# Patient Record
Sex: Female | Born: 1985 | Race: White | Hispanic: No | Smoking: Current every day smoker
Health system: Southern US, Community
[De-identification: ages and names within clinical notes are randomized; demographics above are authoritative.]

## PROBLEM LIST (undated history)

## (undated) DIAGNOSIS — R609 Edema, unspecified: Secondary | ICD-10-CM

## (undated) DIAGNOSIS — F32A Depression, unspecified: Secondary | ICD-10-CM

## (undated) DIAGNOSIS — F1120 Opioid dependence, uncomplicated: Secondary | ICD-10-CM

## (undated) DIAGNOSIS — F802 Mixed receptive-expressive language disorder: Secondary | ICD-10-CM

## (undated) DIAGNOSIS — F419 Anxiety disorder, unspecified: Secondary | ICD-10-CM

## (undated) DIAGNOSIS — F988 Other specified behavioral and emotional disorders with onset usually occurring in childhood and adolescence: Secondary | ICD-10-CM

## (undated) DIAGNOSIS — F1910 Other psychoactive substance abuse, uncomplicated: Secondary | ICD-10-CM

## (undated) DIAGNOSIS — F329 Major depressive disorder, single episode, unspecified: Secondary | ICD-10-CM

## (undated) DIAGNOSIS — G2581 Restless legs syndrome: Secondary | ICD-10-CM

## (undated) DIAGNOSIS — R600 Localized edema: Secondary | ICD-10-CM

## (undated) DIAGNOSIS — S069XAA Unspecified intracranial injury with loss of consciousness status unknown, initial encounter: Secondary | ICD-10-CM

## (undated) DIAGNOSIS — K219 Gastro-esophageal reflux disease without esophagitis: Secondary | ICD-10-CM

## (undated) DIAGNOSIS — Z720 Tobacco use: Secondary | ICD-10-CM

## (undated) DIAGNOSIS — Z7409 Other reduced mobility: Secondary | ICD-10-CM

## (undated) DIAGNOSIS — S069X9A Unspecified intracranial injury with loss of consciousness of unspecified duration, initial encounter: Secondary | ICD-10-CM

## (undated) DIAGNOSIS — E669 Obesity, unspecified: Secondary | ICD-10-CM

## (undated) HISTORY — PX: CHOLECYSTECTOMY: SHX55

## (undated) HISTORY — PX: LITHOTRIPSY: SUR834

## (undated) HISTORY — PX: TUBAL LIGATION: SHX77

## (undated) HISTORY — PX: ESOPHAGOGASTRODUODENOSCOPY: SHX1529

## (undated) HISTORY — PX: ESOPHAGOSCOPY W/ PERCUTANEOUS GASTROSTOMY TUBE PLACEMENT: SUR463

## (undated) HISTORY — PX: URETERAL STENT PLACEMENT: SHX822

---
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
## 2005-01-29 ENCOUNTER — Inpatient Hospital Stay (HOSPITAL_COMMUNITY): Admission: AD | Admit: 2005-01-29 | Discharge: 2005-01-31 | Payer: Self-pay | Admitting: Gynecology

## 2005-06-09 ENCOUNTER — Emergency Department: Payer: Self-pay | Admitting: Emergency Medicine

## 2005-07-15 ENCOUNTER — Emergency Department: Payer: Self-pay | Admitting: Emergency Medicine

## 2005-07-16 ENCOUNTER — Ambulatory Visit: Payer: Self-pay | Admitting: Emergency Medicine

## 2005-07-29 ENCOUNTER — Ambulatory Visit: Payer: Self-pay | Admitting: General Surgery

## 2011-04-07 DIAGNOSIS — K219 Gastro-esophageal reflux disease without esophagitis: Secondary | ICD-10-CM | POA: Insufficient documentation

## 2011-04-29 DIAGNOSIS — F1120 Opioid dependence, uncomplicated: Secondary | ICD-10-CM | POA: Insufficient documentation

## 2011-07-08 ENCOUNTER — Emergency Department: Payer: Self-pay | Admitting: Emergency Medicine

## 2011-07-15 DIAGNOSIS — S0993XA Unspecified injury of face, initial encounter: Secondary | ICD-10-CM | POA: Insufficient documentation

## 2011-07-15 DIAGNOSIS — S0292XA Unspecified fracture of facial bones, initial encounter for closed fracture: Secondary | ICD-10-CM | POA: Insufficient documentation

## 2011-08-18 DIAGNOSIS — K623 Rectal prolapse: Secondary | ICD-10-CM | POA: Insufficient documentation

## 2011-09-16 DIAGNOSIS — M950 Acquired deformity of nose: Secondary | ICD-10-CM | POA: Insufficient documentation

## 2011-11-02 ENCOUNTER — Emergency Department: Payer: Self-pay | Admitting: Emergency Medicine

## 2011-11-02 LAB — URINALYSIS, COMPLETE
Blood: NEGATIVE
Glucose,UR: NEGATIVE mg/dL (ref 0–75)
Nitrite: NEGATIVE
Ph: 7 (ref 4.5–8.0)
Specific Gravity: 1.017 (ref 1.003–1.030)

## 2011-11-02 LAB — COMPREHENSIVE METABOLIC PANEL
Alkaline Phosphatase: 82 U/L (ref 50–136)
Anion Gap: 7 (ref 7–16)
Calcium, Total: 9.7 mg/dL (ref 8.5–10.1)
Co2: 23 mmol/L (ref 21–32)
EGFR (African American): >60
EGFR (Non-African Amer.): >60
SGOT(AST): 18 U/L (ref 15–37)
SGPT (ALT): 16 U/L

## 2011-11-02 LAB — CBC
HCT: 44 % (ref 35.0–47.0)
MCH: 32 pg (ref 26.0–34.0)
MCHC: 34.8 g/dL (ref 32.0–36.0)
MCV: 92 fL (ref 80–100)
RDW: 12.5 % (ref 11.5–14.5)

## 2011-11-02 LAB — AMYLASE: Amylase: 36 U/L (ref 25–115)

## 2011-11-09 ENCOUNTER — Emergency Department: Payer: Self-pay | Admitting: Emergency Medicine

## 2012-02-02 ENCOUNTER — Emergency Department: Payer: Self-pay | Admitting: Emergency Medicine

## 2012-02-02 LAB — URINALYSIS, COMPLETE
Nitrite: NEGATIVE
Ph: 6 (ref 4.5–8.0)
Protein: NEGATIVE
Specific Gravity: 1.005 (ref 1.003–1.030)

## 2012-02-03 ENCOUNTER — Emergency Department: Payer: Self-pay | Admitting: Emergency Medicine

## 2012-04-02 ENCOUNTER — Emergency Department: Payer: Self-pay | Admitting: Emergency Medicine

## 2012-04-02 LAB — URINALYSIS, COMPLETE
Bilirubin,UR: NEGATIVE
Blood: NEGATIVE
Ketone: NEGATIVE
Ph: 9 (ref 4.5–8.0)
Protein: NEGATIVE
RBC,UR: 2 /HPF (ref 0–5)
Specific Gravity: 1.014 (ref 1.003–1.030)
Squamous Epithelial: 5

## 2012-05-16 ENCOUNTER — Emergency Department: Payer: Self-pay | Admitting: Emergency Medicine

## 2012-05-16 LAB — COMPREHENSIVE METABOLIC PANEL
Albumin: 4.7 g/dL (ref 3.4–5.0)
Alkaline Phosphatase: 78 U/L (ref 50–136)
BUN: 10 mg/dL (ref 7–18)
Bilirubin,Total: 0.5 mg/dL (ref 0.2–1.0)
Chloride: 104 mmol/L (ref 98–107)
Creatinine: 0.7 mg/dL (ref 0.60–1.30)
EGFR (African American): >60
Glucose: 99 mg/dL (ref 65–99)
Osmolality: 271 (ref 275–301)
SGPT (ALT): 17 U/L (ref 12–78)
Sodium: 136 mmol/L (ref 136–145)
Total Protein: 8.1 g/dL (ref 6.4–8.2)

## 2012-05-16 LAB — URINALYSIS, COMPLETE
Bilirubin,UR: NEGATIVE
Glucose,UR: NEGATIVE mg/dL (ref 0–75)
Leukocyte Esterase: NEGATIVE
Nitrite: NEGATIVE
Ph: 7 (ref 4.5–8.0)
Protein: NEGATIVE
Specific Gravity: 1.019 (ref 1.003–1.030)
WBC UR: 1 /HPF (ref 0–5)

## 2012-05-16 LAB — CBC WITH DIFFERENTIAL/PLATELET
Basophil #: 0 10*3/uL (ref 0.0–0.1)
Eosinophil #: 0.1 10*3/uL (ref 0.0–0.7)
HCT: 44.5 % (ref 35.0–47.0)
MCH: 32.2 pg (ref 26.0–34.0)
MCV: 94 fL (ref 80–100)
Monocyte #: 0.4 x10 3/mm (ref 0.2–0.9)
Monocyte %: 4.2 %
Neutrophil %: 74.2 %

## 2012-05-16 LAB — LIPASE, BLOOD: Lipase: 212 U/L (ref 73–393)

## 2012-05-29 ENCOUNTER — Emergency Department: Payer: Self-pay | Admitting: Emergency Medicine

## 2012-05-29 LAB — URINALYSIS, COMPLETE
Blood: NEGATIVE
Ketone: NEGATIVE
Nitrite: NEGATIVE
Ph: 9 (ref 4.5–8.0)
RBC,UR: 1 /HPF (ref 0–5)
Specific Gravity: 1.016 (ref 1.003–1.030)
Squamous Epithelial: 2
WBC UR: <1 /HPF (ref 0–5)

## 2012-07-09 ENCOUNTER — Emergency Department: Payer: Self-pay | Admitting: Emergency Medicine

## 2012-07-09 LAB — COMPREHENSIVE METABOLIC PANEL
Albumin: 4 g/dL (ref 3.4–5.0)
Alkaline Phosphatase: 77 U/L (ref 50–136)
Anion Gap: 3 — ABNORMAL LOW (ref 7–16)
BUN: 12 mg/dL (ref 7–18)
Bilirubin,Total: 0.2 mg/dL (ref 0.2–1.0)
Calcium, Total: 9 mg/dL (ref 8.5–10.1)
Chloride: 105 mmol/L (ref 98–107)
Co2: 29 mmol/L (ref 21–32)
Creatinine: 0.75 mg/dL (ref 0.60–1.30)
EGFR (African American): >60
EGFR (Non-African Amer.): >60

## 2012-07-09 LAB — URINALYSIS, COMPLETE
Blood: NEGATIVE
Glucose,UR: NEGATIVE mg/dL (ref 0–75)
Nitrite: NEGATIVE
Protein: NEGATIVE
RBC,UR: 1 /HPF (ref 0–5)
Squamous Epithelial: 8
WBC UR: 1 /HPF (ref 0–5)

## 2012-07-09 LAB — CBC
MCHC: 35.3 g/dL (ref 32.0–36.0)
MCV: 92 fL (ref 80–100)
RBC: 4.6 10*6/uL (ref 3.80–5.20)
WBC: 7.4 10*3/uL (ref 3.6–11.0)

## 2012-07-15 ENCOUNTER — Emergency Department: Payer: Self-pay | Admitting: Emergency Medicine

## 2012-07-15 LAB — URINALYSIS, COMPLETE
Bilirubin,UR: NEGATIVE
Blood: NEGATIVE
Ketone: NEGATIVE
Leukocyte Esterase: NEGATIVE
Nitrite: NEGATIVE
Ph: 7 (ref 4.5–8.0)
Protein: NEGATIVE
RBC,UR: 1 /HPF (ref 0–5)
Specific Gravity: 1.012 (ref 1.003–1.030)
Squamous Epithelial: 16

## 2012-07-15 LAB — COMPREHENSIVE METABOLIC PANEL
Alkaline Phosphatase: 74 U/L (ref 50–136)
Anion Gap: 6 — ABNORMAL LOW (ref 7–16)
BUN: 9 mg/dL (ref 7–18)
Chloride: 106 mmol/L (ref 98–107)
Creatinine: 0.71 mg/dL (ref 0.60–1.30)
Glucose: 106 mg/dL — ABNORMAL HIGH (ref 65–99)
Osmolality: 279 (ref 275–301)
Potassium: 4 mmol/L (ref 3.5–5.1)
SGOT(AST): 17 U/L (ref 15–37)
SGPT (ALT): 14 U/L (ref 12–78)
Sodium: 140 mmol/L (ref 136–145)

## 2012-07-15 LAB — CBC
MCH: 32.2 pg (ref 26.0–34.0)
Platelet: 227 10*3/uL (ref 150–440)
RBC: 4.45 10*6/uL (ref 3.80–5.20)
WBC: 11.7 10*3/uL — ABNORMAL HIGH (ref 3.6–11.0)

## 2012-07-15 LAB — WET PREP, GENITAL

## 2012-07-18 ENCOUNTER — Emergency Department: Payer: Self-pay | Admitting: Emergency Medicine

## 2012-07-18 LAB — URINALYSIS, COMPLETE
Bilirubin,UR: NEGATIVE
Blood: NEGATIVE
Glucose,UR: NEGATIVE mg/dL (ref 0–75)
Ketone: NEGATIVE
Nitrite: NEGATIVE
Ph: 5 (ref 4.5–8.0)
Protein: 30
Specific Gravity: 1.026 (ref 1.003–1.030)
WBC UR: 4 /HPF (ref 0–5)

## 2012-07-18 LAB — COMPREHENSIVE METABOLIC PANEL
Bilirubin,Total: 0.3 mg/dL (ref 0.2–1.0)
Calcium, Total: 9.2 mg/dL (ref 8.5–10.1)
Chloride: 108 mmol/L — ABNORMAL HIGH (ref 98–107)
Co2: 29 mmol/L (ref 21–32)
EGFR (African American): >60
EGFR (Non-African Amer.): >60
Glucose: 101 mg/dL — ABNORMAL HIGH (ref 65–99)
Osmolality: 279 (ref 275–301)
SGOT(AST): 22 U/L (ref 15–37)
SGPT (ALT): 14 U/L (ref 12–78)
Total Protein: 7 g/dL (ref 6.4–8.2)

## 2012-07-18 LAB — CBC
HGB: 14.7 g/dL (ref 12.0–16.0)
MCHC: 34.9 g/dL (ref 32.0–36.0)
MCV: 94 fL (ref 80–100)
Platelet: 217 10*3/uL (ref 150–440)

## 2012-07-21 ENCOUNTER — Emergency Department: Payer: Self-pay | Admitting: Internal Medicine

## 2012-07-21 LAB — URINALYSIS, COMPLETE
Bacteria: NONE SEEN
Bilirubin,UR: NEGATIVE
Bilirubin,UR: NEGATIVE
Glucose,UR: NEGATIVE mg/dL (ref 0–75)
Ketone: NEGATIVE
Leukocyte Esterase: NEGATIVE
Nitrite: NEGATIVE
Ph: 7 (ref 4.5–8.0)
Protein: NEGATIVE
Squamous Epithelial: 11
Squamous Epithelial: NONE SEEN
WBC UR: 4 /HPF (ref 0–5)
WBC UR: 1 /HPF (ref 0–5)

## 2012-08-03 ENCOUNTER — Emergency Department: Payer: Self-pay | Admitting: Emergency Medicine

## 2012-08-03 LAB — URINALYSIS, COMPLETE
Glucose,UR: NEGATIVE mg/dL (ref 0–75)
Ketone: NEGATIVE
Leukocyte Esterase: NEGATIVE
Ph: 8 (ref 4.5–8.0)
RBC,UR: 15 /HPF (ref 0–5)
Squamous Epithelial: 2
WBC UR: 1 /HPF (ref 0–5)

## 2012-08-09 ENCOUNTER — Emergency Department: Payer: Self-pay | Admitting: Emergency Medicine

## 2012-08-09 LAB — CBC
HCT: 38.8 % (ref 35.0–47.0)
HGB: 13.4 g/dL (ref 12.0–16.0)
MCH: 32.5 pg (ref 26.0–34.0)
Platelet: 214 10*3/uL (ref 150–440)
RBC: 4.12 10*6/uL (ref 3.80–5.20)
RDW: 12.6 % (ref 11.5–14.5)
WBC: 11.1 10*3/uL — ABNORMAL HIGH (ref 3.6–11.0)

## 2012-08-09 LAB — BASIC METABOLIC PANEL
BUN: 8 mg/dL (ref 7–18)
Calcium, Total: 8.9 mg/dL (ref 8.5–10.1)
Co2: 30 mmol/L (ref 21–32)
EGFR (African American): >60
EGFR (Non-African Amer.): >60
Potassium: 3.9 mmol/L (ref 3.5–5.1)

## 2012-08-16 ENCOUNTER — Ambulatory Visit: Payer: Self-pay | Admitting: Emergency Medicine

## 2012-09-10 ENCOUNTER — Emergency Department: Payer: Self-pay | Admitting: Emergency Medicine

## 2012-10-11 ENCOUNTER — Emergency Department: Payer: Self-pay | Admitting: Emergency Medicine

## 2012-10-13 DIAGNOSIS — S02610A Fracture of condylar process of mandible, unspecified side, initial encounter for closed fracture: Secondary | ICD-10-CM | POA: Insufficient documentation

## 2012-11-02 ENCOUNTER — Emergency Department: Payer: Self-pay | Admitting: Emergency Medicine

## 2012-11-02 LAB — COMPREHENSIVE METABOLIC PANEL
Albumin: 3.9 g/dL (ref 3.4–5.0)
Alkaline Phosphatase: 89 U/L (ref 50–136)
Anion Gap: 4 — ABNORMAL LOW (ref 7–16)
Chloride: 107 mmol/L (ref 98–107)
Co2: 30 mmol/L (ref 21–32)
Creatinine: 0.78 mg/dL (ref 0.60–1.30)
EGFR (Non-African Amer.): >60
Glucose: 91 mg/dL (ref 65–99)
Osmolality: 281 (ref 275–301)
Potassium: 4.2 mmol/L (ref 3.5–5.1)
SGOT(AST): 14 U/L — ABNORMAL LOW (ref 15–37)
Sodium: 141 mmol/L (ref 136–145)

## 2012-11-02 LAB — URINALYSIS, COMPLETE
Bilirubin,UR: NEGATIVE
Ketone: NEGATIVE
Nitrite: NEGATIVE
Ph: 6 (ref 4.5–8.0)
Protein: NEGATIVE
WBC UR: 1 /HPF (ref 0–5)

## 2012-11-02 LAB — CBC
HGB: 14.3 g/dL (ref 12.0–16.0)
MCH: 32.3 pg (ref 26.0–34.0)
MCV: 93 fL (ref 80–100)
RBC: 4.43 10*6/uL (ref 3.80–5.20)

## 2012-11-02 LAB — LIPASE, BLOOD: Lipase: 436 U/L — ABNORMAL HIGH (ref 73–393)

## 2012-11-03 LAB — GC/CHLAMYDIA PROBE AMP

## 2012-11-19 ENCOUNTER — Emergency Department: Payer: Self-pay | Admitting: Emergency Medicine

## 2012-11-19 LAB — URINALYSIS, COMPLETE
Glucose,UR: NEGATIVE mg/dL (ref 0–75)
Ketone: NEGATIVE
Leukocyte Esterase: NEGATIVE
Nitrite: NEGATIVE
Protein: NEGATIVE
Specific Gravity: 1.013 (ref 1.003–1.030)

## 2012-12-15 ENCOUNTER — Emergency Department: Payer: Self-pay | Admitting: Internal Medicine

## 2012-12-15 LAB — URINALYSIS, COMPLETE
Bilirubin,UR: NEGATIVE
Glucose,UR: NEGATIVE mg/dL
Ketone: NEGATIVE
Leukocyte Esterase: NEGATIVE
Nitrite: NEGATIVE
Ph: 8
Protein: NEGATIVE
RBC,UR: 2 /HPF
Specific Gravity: 1.017
Squamous Epithelial: 5
WBC UR: 1 /HPF

## 2012-12-15 LAB — CBC
HCT: 42 %
HGB: 14.7 g/dL
MCH: 32.6 pg
MCHC: 34.9 g/dL
MCV: 93 fL
Platelet: 197 10*3/uL
RBC: 4.5 X10 6/mm 3
RDW: 12.9 %
WBC: 7.6 10*3/uL

## 2012-12-15 LAB — BASIC METABOLIC PANEL
Calcium, Total: 9.2 mg/dL (ref 8.5–10.1)
EGFR (African American): >60
Glucose: 97 mg/dL (ref 65–99)

## 2012-12-28 ENCOUNTER — Emergency Department: Payer: Self-pay | Admitting: Emergency Medicine

## 2012-12-28 LAB — BASIC METABOLIC PANEL
BUN: 10 mg/dL (ref 7–18)
Calcium, Total: 9.5 mg/dL (ref 8.5–10.1)
EGFR (African American): >60
EGFR (Non-African Amer.): >60
Osmolality: 270 (ref 275–301)
Potassium: 4.1 mmol/L (ref 3.5–5.1)

## 2012-12-28 LAB — CBC
HGB: 14.4 g/dL (ref 12.0–16.0)
MCH: 32.7 pg (ref 26.0–34.0)
MCHC: 35.4 g/dL (ref 32.0–36.0)
MCV: 93 fL (ref 80–100)
RBC: 4.39 10*6/uL (ref 3.80–5.20)
WBC: 7.3 10*3/uL (ref 3.6–11.0)

## 2012-12-28 LAB — URINALYSIS, COMPLETE
Bacteria: NONE SEEN
Glucose,UR: NEGATIVE mg/dL (ref 0–75)
Ph: 7 (ref 4.5–8.0)
Protein: 30

## 2012-12-28 LAB — DRUG SCREEN, URINE
Barbiturates, Ur Screen: POSITIVE (ref ?–200)
Benzodiazepine, Ur Scrn: POSITIVE (ref ?–200)
MDMA (Ecstasy)Ur Screen: NEGATIVE (ref ?–500)
Methadone, Ur Screen: POSITIVE (ref ?–300)

## 2013-02-01 ENCOUNTER — Emergency Department: Payer: Self-pay | Admitting: Emergency Medicine

## 2013-02-02 ENCOUNTER — Emergency Department: Payer: Self-pay | Admitting: Emergency Medicine

## 2013-02-08 ENCOUNTER — Ambulatory Visit: Payer: Self-pay

## 2013-02-21 ENCOUNTER — Emergency Department: Payer: Self-pay | Admitting: Emergency Medicine

## 2013-02-21 LAB — BASIC METABOLIC PANEL
Anion Gap: 3 — ABNORMAL LOW (ref 7–16)
BUN: 11 mg/dL (ref 7–18)
Calcium, Total: 9.5 mg/dL (ref 8.5–10.1)
Chloride: 103 mmol/L (ref 98–107)
EGFR (Non-African Amer.): >60
Glucose: 102 mg/dL — ABNORMAL HIGH (ref 65–99)
Osmolality: 272 (ref 275–301)

## 2013-02-21 LAB — URINALYSIS, COMPLETE
Bacteria: NONE SEEN
Ketone: NEGATIVE
Nitrite: NEGATIVE
Ph: 6 (ref 4.5–8.0)
Squamous Epithelial: 14

## 2013-02-21 LAB — CBC
HGB: 13.2 g/dL (ref 12.0–16.0)
RDW: 12.4 % (ref 11.5–14.5)

## 2013-02-22 LAB — DRUG SCREEN, URINE
Amphetamines, Ur Screen: NEGATIVE (ref ?–1000)
Barbiturates, Ur Screen: NEGATIVE (ref ?–200)
Cannabinoid 50 Ng, Ur ~~LOC~~: POSITIVE (ref ?–50)
MDMA (Ecstasy)Ur Screen: NEGATIVE (ref ?–500)
Phencyclidine (PCP) Ur S: NEGATIVE (ref ?–25)
Tricyclic, Ur Screen: NEGATIVE (ref ?–1000)

## 2013-03-24 ENCOUNTER — Emergency Department: Payer: Self-pay | Admitting: Emergency Medicine

## 2013-03-24 LAB — CBC WITH DIFFERENTIAL/PLATELET
Basophil #: 0.1 10*3/uL (ref 0.0–0.1)
Eosinophil %: 1.2 %
HGB: 13 g/dL (ref 12.0–16.0)
Lymphocyte %: 18.7 %
MCHC: 34.2 g/dL (ref 32.0–36.0)
MCV: 93 fL (ref 80–100)
Monocyte #: 0.6 x10 3/mm (ref 0.2–0.9)
Neutrophil #: 8.4 10*3/uL — ABNORMAL HIGH (ref 1.4–6.5)
Neutrophil %: 74.1 %
Platelet: 213 10*3/uL (ref 150–440)
WBC: 11.4 10*3/uL — ABNORMAL HIGH (ref 3.6–11.0)

## 2013-03-24 LAB — DRUG SCREEN, URINE
Amphetamines, Ur Screen: NEGATIVE (ref ?–1000)
Benzodiazepine, Ur Scrn: NEGATIVE (ref ?–200)
MDMA (Ecstasy)Ur Screen: NEGATIVE (ref ?–500)
Methadone, Ur Screen: NEGATIVE (ref ?–300)

## 2013-03-24 LAB — URINALYSIS, COMPLETE: Protein: 30

## 2013-03-25 LAB — COMPREHENSIVE METABOLIC PANEL
Albumin: 3.5 g/dL (ref 3.4–5.0)
Alkaline Phosphatase: 64 U/L
Anion Gap: 5 — ABNORMAL LOW (ref 7–16)
Creatinine: 0.85 mg/dL (ref 0.60–1.30)
EGFR (African American): >60
Potassium: 3.9 mmol/L (ref 3.5–5.1)
SGPT (ALT): 13 U/L (ref 12–78)

## 2013-04-11 ENCOUNTER — Emergency Department: Payer: Self-pay | Admitting: Emergency Medicine

## 2013-04-11 LAB — URINALYSIS, COMPLETE
Bacteria: NONE SEEN
Bilirubin,UR: NEGATIVE
Blood: NEGATIVE
Glucose,UR: NEGATIVE mg/dL (ref 0–75)
Leukocyte Esterase: NEGATIVE
Nitrite: NEGATIVE
Specific Gravity: 1.01 (ref 1.003–1.030)
WBC UR: 1 /HPF (ref 0–5)

## 2013-04-11 LAB — COMPREHENSIVE METABOLIC PANEL
Albumin: 4.1 g/dL (ref 3.4–5.0)
Alkaline Phosphatase: 74 U/L
Anion Gap: 5 — ABNORMAL LOW (ref 7–16)
BUN: 10 mg/dL (ref 7–18)
Calcium, Total: 9.9 mg/dL (ref 8.5–10.1)
Creatinine: 0.77 mg/dL (ref 0.60–1.30)
EGFR (African American): >60
EGFR (Non-African Amer.): >60
Osmolality: 276 (ref 275–301)
Potassium: 3.6 mmol/L (ref 3.5–5.1)

## 2013-04-11 LAB — CBC WITH DIFFERENTIAL/PLATELET
Basophil %: 0.7 %
MCV: 93 fL (ref 80–100)
Monocyte %: 3.5 %
Platelet: 266 10*3/uL (ref 150–440)
RBC: 4.56 10*6/uL (ref 3.80–5.20)
WBC: 8.8 10*3/uL (ref 3.6–11.0)

## 2013-04-12 ENCOUNTER — Inpatient Hospital Stay: Payer: Self-pay | Admitting: Internal Medicine

## 2013-04-12 LAB — CBC WITH DIFFERENTIAL/PLATELET
Basophil %: 0.4 %
MCH: 32.8 pg (ref 26.0–34.0)
MCV: 93 fL (ref 80–100)
Neutrophil #: 6.6 10*3/uL — ABNORMAL HIGH (ref 1.4–6.5)
Neutrophil %: 70.9 %
RDW: 13.2 % (ref 11.5–14.5)

## 2013-04-12 LAB — COMPREHENSIVE METABOLIC PANEL
Calcium, Total: 9.8 mg/dL (ref 8.5–10.1)
Creatinine: 1.02 mg/dL (ref 0.60–1.30)
Potassium: 4.1 mmol/L (ref 3.5–5.1)
Sodium: 141 mmol/L (ref 136–145)
Total Protein: 7.7 g/dL (ref 6.4–8.2)

## 2013-04-12 LAB — LIPASE, BLOOD: Lipase: 563 U/L — ABNORMAL HIGH (ref 73–393)

## 2013-04-13 LAB — COMPREHENSIVE METABOLIC PANEL
Albumin: 3.2 g/dL — ABNORMAL LOW (ref 3.4–5.0)
Bilirubin,Total: 0.4 mg/dL (ref 0.2–1.0)
Co2: 27 mmol/L (ref 21–32)
Creatinine: 0.72 mg/dL (ref 0.60–1.30)
EGFR (African American): >60
EGFR (Non-African Amer.): >60
Glucose: 97 mg/dL (ref 65–99)
Osmolality: 281 (ref 275–301)
SGPT (ALT): 11 U/L — ABNORMAL LOW (ref 12–78)

## 2013-04-13 LAB — CBC WITH DIFFERENTIAL/PLATELET
Basophil %: 0.5 %
Lymphocyte #: 3 10*3/uL (ref 1.0–3.6)
MCH: 32.4 pg (ref 26.0–34.0)
MCHC: 34.4 g/dL (ref 32.0–36.0)
MCV: 94 fL (ref 80–100)
Monocyte #: 0.6 x10 3/mm (ref 0.2–0.9)
Platelet: 208 10*3/uL (ref 150–440)

## 2013-04-14 LAB — URINALYSIS, COMPLETE
Glucose,UR: NEGATIVE mg/dL (ref 0–75)
Ketone: NEGATIVE
Ph: 6 (ref 4.5–8.0)
Specific Gravity: 1.011 (ref 1.003–1.030)
WBC UR: 373 /HPF (ref 0–5)

## 2013-04-21 ENCOUNTER — Emergency Department: Payer: Self-pay | Admitting: Emergency Medicine
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
## 2013-08-31 ENCOUNTER — Emergency Department: Payer: Self-pay | Admitting: Emergency Medicine

## 2013-09-01 LAB — URINALYSIS, COMPLETE
BILIRUBIN, UR: NEGATIVE
GLUCOSE, UR: NEGATIVE mg/dL (ref 0–75)
KETONE: NEGATIVE
Nitrite: NEGATIVE
Ph: 7 (ref 4.5–8.0)
Protein: 100
RBC,UR: 23 /HPF (ref 0–5)
Specific Gravity: 1.018 (ref 1.003–1.030)
WBC UR: 57 /HPF (ref 0–5)

## 2013-09-05 ENCOUNTER — Emergency Department: Payer: Self-pay | Admitting: Emergency Medicine

## 2013-09-05 LAB — CBC WITH DIFFERENTIAL/PLATELET
BASOS PCT: 0.5 %
Basophil #: 0 10*3/uL (ref 0.0–0.1)
EOS ABS: 0.3 10*3/uL (ref 0.0–0.7)
Eosinophil %: 4.4 %
HCT: 41 % (ref 35.0–47.0)
HGB: 14 g/dL (ref 12.0–16.0)
LYMPHS ABS: 2 10*3/uL (ref 1.0–3.6)
LYMPHS PCT: 34.9 %
MCH: 32 pg (ref 26.0–34.0)
MCHC: 34.2 g/dL (ref 32.0–36.0)
MCV: 94 fL (ref 80–100)
MONO ABS: 0.4 x10 3/mm (ref 0.2–0.9)
MONOS PCT: 6.7 %
Neutrophil #: 3.1 10*3/uL (ref 1.4–6.5)
Neutrophil %: 53.5 %
PLATELETS: 190 10*3/uL (ref 150–440)
RBC: 4.38 10*6/uL (ref 3.80–5.20)
RDW: 12.5 % (ref 11.5–14.5)
WBC: 5.8 10*3/uL (ref 3.6–11.0)

## 2013-09-05 LAB — BASIC METABOLIC PANEL
Anion Gap: 2 — ABNORMAL LOW (ref 7–16)
BUN: 16 mg/dL (ref 7–18)
CHLORIDE: 105 mmol/L (ref 98–107)
CO2: 32 mmol/L (ref 21–32)
Calcium, Total: 9.5 mg/dL (ref 8.5–10.1)
Creatinine: 0.75 mg/dL (ref 0.60–1.30)
EGFR (African American): >60
EGFR (Non-African Amer.): >60
Glucose: 108 mg/dL — ABNORMAL HIGH (ref 65–99)
Osmolality: 279 (ref 275–301)
Potassium: 3.5 mmol/L (ref 3.5–5.1)
SODIUM: 139 mmol/L (ref 136–145)

## 2013-09-08 ENCOUNTER — Ambulatory Visit: Payer: Self-pay | Admitting: Physician Assistant

## 2013-09-08 LAB — URINALYSIS, COMPLETE
Bilirubin,UR: NEGATIVE
GLUCOSE, UR: NEGATIVE mg/dL (ref 0–75)
Ketone: NEGATIVE
Nitrite: POSITIVE
Ph: 6 (ref 4.5–8.0)
Specific Gravity: 1.02 (ref 1.003–1.030)

## 2013-09-08 LAB — CBC WITH DIFFERENTIAL/PLATELET
Basophil #: 0 10*3/uL (ref 0.0–0.1)
Basophil %: 0.2 %
Eosinophil #: 0.2 10*3/uL (ref 0.0–0.7)
Eosinophil %: 1.5 %
HCT: 39.7 % (ref 35.0–47.0)
HGB: 13.6 g/dL (ref 12.0–16.0)
LYMPHS ABS: 1 10*3/uL (ref 1.0–3.6)
Lymphocyte %: 9.4 %
MCH: 32.1 pg (ref 26.0–34.0)
MCHC: 34.2 g/dL (ref 32.0–36.0)
MCV: 94 fL (ref 80–100)
MONOS PCT: 7.1 %
Monocyte #: 0.8 x10 3/mm (ref 0.2–0.9)
NEUTROS ABS: 8.9 10*3/uL — AB (ref 1.4–6.5)
Neutrophil %: 81.8 %
PLATELETS: 137 10*3/uL — AB (ref 150–440)
RBC: 4.22 10*6/uL (ref 3.80–5.20)
RDW: 12.9 % (ref 11.5–14.5)
WBC: 10.9 10*3/uL (ref 3.6–11.0)

## 2013-09-08 LAB — BASIC METABOLIC PANEL
Anion Gap: 8 (ref 7–16)
BUN: 11 mg/dL (ref 7–18)
CALCIUM: 8.6 mg/dL (ref 8.5–10.1)
CHLORIDE: 102 mmol/L (ref 98–107)
CREATININE: 1.03 mg/dL (ref 0.60–1.30)
Co2: 28 mmol/L (ref 21–32)
EGFR (African American): >60
Glucose: 102 mg/dL — ABNORMAL HIGH (ref 65–99)
OSMOLALITY: 275 (ref 275–301)
POTASSIUM: 3.7 mmol/L (ref 3.5–5.1)
Sodium: 138 mmol/L (ref 136–145)

## 2013-09-08 LAB — WET PREP, GENITAL

## 2013-09-10 LAB — URINE CULTURE

## 2013-10-04 ENCOUNTER — Ambulatory Visit: Payer: Self-pay | Admitting: Family Medicine

## 2013-10-08 ENCOUNTER — Emergency Department: Payer: Self-pay | Admitting: Internal Medicine

## 2013-10-08 LAB — COMPREHENSIVE METABOLIC PANEL
ALBUMIN: 3.6 g/dL (ref 3.4–5.0)
ALT: 18 U/L (ref 12–78)
Alkaline Phosphatase: 75 U/L
Anion Gap: 7 (ref 7–16)
BUN: 18 mg/dL (ref 7–18)
Bilirubin,Total: 0.3 mg/dL (ref 0.2–1.0)
CHLORIDE: 108 mmol/L — AB (ref 98–107)
Calcium, Total: 9 mg/dL (ref 8.5–10.1)
Co2: 27 mmol/L (ref 21–32)
Creatinine: 0.98 mg/dL (ref 0.60–1.30)
EGFR (Non-African Amer.): >60
Glucose: 86 mg/dL (ref 65–99)
Osmolality: 284 (ref 275–301)
POTASSIUM: 3.7 mmol/L (ref 3.5–5.1)
SGOT(AST): 27 U/L (ref 15–37)
Sodium: 142 mmol/L (ref 136–145)
Total Protein: 6.8 g/dL (ref 6.4–8.2)

## 2013-10-08 LAB — CBC
HCT: 37.8 % (ref 35.0–47.0)
HGB: 12.7 g/dL (ref 12.0–16.0)
MCH: 31.3 pg (ref 26.0–34.0)
MCHC: 33.6 g/dL (ref 32.0–36.0)
MCV: 93 fL (ref 80–100)
Platelet: 162 10*3/uL (ref 150–440)
RBC: 4.06 10*6/uL (ref 3.80–5.20)
RDW: 12.5 % (ref 11.5–14.5)
WBC: 5.9 10*3/uL (ref 3.6–11.0)

## 2013-10-08 LAB — LIPASE, BLOOD: Lipase: 287 U/L (ref 73–393)

## 2013-11-03 ENCOUNTER — Emergency Department: Payer: Self-pay | Admitting: Emergency Medicine

## 2013-11-03 LAB — COMPREHENSIVE METABOLIC PANEL
Albumin: 3.7 g/dL (ref 3.4–5.0)
Alkaline Phosphatase: 72 U/L
Anion Gap: 7 (ref 7–16)
BILIRUBIN TOTAL: 0.6 mg/dL (ref 0.2–1.0)
BUN: 15 mg/dL (ref 7–18)
CREATININE: 1.09 mg/dL (ref 0.60–1.30)
Calcium, Total: 9.4 mg/dL (ref 8.5–10.1)
Chloride: 108 mmol/L — ABNORMAL HIGH (ref 98–107)
Co2: 26 mmol/L (ref 21–32)
EGFR (African American): >60
EGFR (Non-African Amer.): >60
Glucose: 110 mg/dL — ABNORMAL HIGH (ref 65–99)
Osmolality: 283 (ref 275–301)
Potassium: 3.9 mmol/L (ref 3.5–5.1)
SGOT(AST): 19 U/L (ref 15–37)
SGPT (ALT): 18 U/L (ref 12–78)
Sodium: 141 mmol/L (ref 136–145)
Total Protein: 7.3 g/dL (ref 6.4–8.2)

## 2013-11-03 LAB — CBC WITH DIFFERENTIAL/PLATELET
Basophil #: 0.1 10*3/uL (ref 0.0–0.1)
Basophil %: 0.8 %
EOS PCT: 3.3 %
Eosinophil #: 0.2 10*3/uL (ref 0.0–0.7)
HCT: 39.4 % (ref 35.0–47.0)
HGB: 13.2 g/dL (ref 12.0–16.0)
Lymphocyte #: 3.1 10*3/uL (ref 1.0–3.6)
Lymphocyte %: 45.1 %
MCH: 32.7 pg (ref 26.0–34.0)
MCHC: 33.6 g/dL (ref 32.0–36.0)
MCV: 98 fL (ref 80–100)
MONOS PCT: 6.9 %
Monocyte #: 0.5 x10 3/mm (ref 0.2–0.9)
Neutrophil #: 3 10*3/uL (ref 1.4–6.5)
Neutrophil %: 43.9 %
PLATELETS: 201 10*3/uL (ref 150–440)
RBC: 4.04 10*6/uL (ref 3.80–5.20)
RDW: 12.5 % (ref 11.5–14.5)
WBC: 6.9 10*3/uL (ref 3.6–11.0)

## 2013-11-03 LAB — LIPASE, BLOOD: Lipase: 272 U/L (ref 73–393)

## 2013-11-04 LAB — URINALYSIS, COMPLETE
Bilirubin,UR: NEGATIVE
Glucose,UR: NEGATIVE mg/dL (ref 0–75)
Ketone: NEGATIVE
Leukocyte Esterase: NEGATIVE
NITRITE: NEGATIVE
PH: 5 (ref 4.5–8.0)
Protein: 30
RBC,UR: 531 /HPF (ref 0–5)
SPECIFIC GRAVITY: 1.026 (ref 1.003–1.030)
Squamous Epithelial: 1
WBC UR: 8 /HPF (ref 0–5)

## 2013-11-04 LAB — PREGNANCY, URINE: Pregnancy Test, Urine: NEGATIVE m[IU]/mL

## 2013-11-25 DIAGNOSIS — F329 Major depressive disorder, single episode, unspecified: Secondary | ICD-10-CM | POA: Insufficient documentation

## 2013-11-25 DIAGNOSIS — F32A Depression, unspecified: Secondary | ICD-10-CM | POA: Insufficient documentation

## 2013-11-25 DIAGNOSIS — G2581 Restless legs syndrome: Secondary | ICD-10-CM | POA: Insufficient documentation

## 2013-11-25 DIAGNOSIS — F419 Anxiety disorder, unspecified: Secondary | ICD-10-CM | POA: Insufficient documentation

## 2013-12-03 ENCOUNTER — Emergency Department: Payer: Self-pay | Admitting: Emergency Medicine

## 2013-12-03 LAB — URINALYSIS, COMPLETE
BACTERIA: NONE SEEN
BILIRUBIN, UR: NEGATIVE
Blood: NEGATIVE
GLUCOSE, UR: NEGATIVE mg/dL (ref 0–75)
Ketone: NEGATIVE
Leukocyte Esterase: NEGATIVE
Nitrite: NEGATIVE
PH: 6 (ref 4.5–8.0)
Protein: NEGATIVE
RBC, UR: NONE SEEN /HPF (ref 0–5)
Specific Gravity: 1.016 (ref 1.003–1.030)

## 2013-12-03 LAB — COMPREHENSIVE METABOLIC PANEL
ALBUMIN: 3.5 g/dL (ref 3.4–5.0)
ALT: 20 U/L
ANION GAP: 7 (ref 7–16)
AST: 16 U/L (ref 15–37)
Alkaline Phosphatase: 83 U/L
BUN: 16 mg/dL (ref 7–18)
Bilirubin,Total: 0.1 mg/dL — ABNORMAL LOW (ref 0.2–1.0)
CALCIUM: 9.2 mg/dL (ref 8.5–10.1)
CHLORIDE: 106 mmol/L (ref 98–107)
Co2: 28 mmol/L (ref 21–32)
Creatinine: 0.86 mg/dL (ref 0.60–1.30)
EGFR (African American): >60
EGFR (Non-African Amer.): >60
GLUCOSE: 107 mg/dL — AB (ref 65–99)
OSMOLALITY: 283 (ref 275–301)
POTASSIUM: 3.7 mmol/L (ref 3.5–5.1)
Sodium: 141 mmol/L (ref 136–145)
TOTAL PROTEIN: 6.9 g/dL (ref 6.4–8.2)

## 2013-12-03 LAB — CBC
HCT: 39.2 % (ref 35.0–47.0)
HGB: 13.3 g/dL (ref 12.0–16.0)
MCH: 32.3 pg (ref 26.0–34.0)
MCHC: 34 g/dL (ref 32.0–36.0)
MCV: 95 fL (ref 80–100)
Platelet: 213 10*3/uL (ref 150–440)
RBC: 4.13 10*6/uL (ref 3.80–5.20)
RDW: 12.3 % (ref 11.5–14.5)
WBC: 8 10*3/uL (ref 3.6–11.0)

## 2013-12-03 LAB — LIPASE, BLOOD: LIPASE: 288 U/L (ref 73–393)

## 2013-12-31 ENCOUNTER — Emergency Department: Payer: Self-pay | Admitting: Internal Medicine

## 2013-12-31 LAB — COMPREHENSIVE METABOLIC PANEL
ALK PHOS: 82 U/L
ALT: 14 U/L
Albumin: 3.5 g/dL (ref 3.4–5.0)
Anion Gap: 8 (ref 7–16)
BUN: 9 mg/dL (ref 7–18)
Bilirubin,Total: 0.2 mg/dL (ref 0.2–1.0)
CHLORIDE: 106 mmol/L (ref 98–107)
CREATININE: 0.81 mg/dL (ref 0.60–1.30)
Calcium, Total: 9.4 mg/dL (ref 8.5–10.1)
Co2: 24 mmol/L (ref 21–32)
EGFR (African American): >60
EGFR (Non-African Amer.): >60
Glucose: 114 mg/dL — ABNORMAL HIGH (ref 65–99)
Osmolality: 275 (ref 275–301)
POTASSIUM: 3.8 mmol/L (ref 3.5–5.1)
SGOT(AST): 12 U/L — ABNORMAL LOW (ref 15–37)
SODIUM: 138 mmol/L (ref 136–145)
TOTAL PROTEIN: 7.1 g/dL (ref 6.4–8.2)

## 2013-12-31 LAB — URINALYSIS, COMPLETE
BILIRUBIN, UR: NEGATIVE
Bacteria: NONE SEEN
Glucose,UR: NEGATIVE mg/dL (ref 0–75)
Ketone: NEGATIVE
Leukocyte Esterase: NEGATIVE
Nitrite: NEGATIVE
PROTEIN: NEGATIVE
Ph: 6 (ref 4.5–8.0)
RBC,UR: 170 /HPF (ref 0–5)
SPECIFIC GRAVITY: 1.018 (ref 1.003–1.030)
Squamous Epithelial: 3
WBC UR: 2 /HPF (ref 0–5)

## 2013-12-31 LAB — CBC
HCT: 40.1 % (ref 35.0–47.0)
HGB: 13.4 g/dL (ref 12.0–16.0)
MCH: 31.5 pg (ref 26.0–34.0)
MCHC: 33.5 g/dL (ref 32.0–36.0)
MCV: 94 fL (ref 80–100)
Platelet: 217 10*3/uL (ref 150–440)
RBC: 4.27 10*6/uL (ref 3.80–5.20)
RDW: 11.9 % (ref 11.5–14.5)
WBC: 6.9 10*3/uL (ref 3.6–11.0)

## 2014-02-10 ENCOUNTER — Emergency Department: Payer: Self-pay | Admitting: Student

## 2014-03-06 ENCOUNTER — Ambulatory Visit: Payer: Self-pay | Admitting: Family Medicine

## 2014-03-06 ENCOUNTER — Ambulatory Visit: Payer: Self-pay | Admitting: Physician Assistant

## 2014-03-06 LAB — D-DIMER(ARMC): D-DIMER: 222 ng/mL

## 2014-03-07 ENCOUNTER — Emergency Department: Payer: Self-pay | Admitting: Emergency Medicine

## 2014-03-07 LAB — COMPREHENSIVE METABOLIC PANEL
ALK PHOS: 85 U/L
ALT: 17 U/L
Albumin: 3.7 g/dL (ref 3.4–5.0)
Anion Gap: 4 — ABNORMAL LOW (ref 7–16)
BILIRUBIN TOTAL: 0.2 mg/dL (ref 0.2–1.0)
BUN: 12 mg/dL (ref 7–18)
CHLORIDE: 103 mmol/L (ref 98–107)
Calcium, Total: 8.9 mg/dL (ref 8.5–10.1)
Co2: 32 mmol/L (ref 21–32)
Creatinine: 0.79 mg/dL (ref 0.60–1.30)
Glucose: 90 mg/dL (ref 65–99)
OSMOLALITY: 277 (ref 275–301)
Potassium: 3.8 mmol/L (ref 3.5–5.1)
SGOT(AST): 22 U/L (ref 15–37)
Sodium: 139 mmol/L (ref 136–145)
TOTAL PROTEIN: 7.3 g/dL (ref 6.4–8.2)

## 2014-03-07 LAB — CBC
HCT: 41.8 % (ref 35.0–47.0)
HGB: 14.1 g/dL (ref 12.0–16.0)
MCH: 31.5 pg (ref 26.0–34.0)
MCHC: 33.8 g/dL (ref 32.0–36.0)
MCV: 93 fL (ref 80–100)
PLATELETS: 245 10*3/uL (ref 150–440)
RBC: 4.49 10*6/uL (ref 3.80–5.20)
RDW: 12.6 % (ref 11.5–14.5)
WBC: 6.1 10*3/uL (ref 3.6–11.0)

## 2014-03-07 LAB — TROPONIN I: Troponin-I: <0.02 ng/mL

## 2014-03-12 ENCOUNTER — Emergency Department: Payer: Self-pay | Admitting: Emergency Medicine

## 2014-03-16 ENCOUNTER — Ambulatory Visit: Payer: Self-pay | Admitting: Physician Assistant

## 2014-03-16 ENCOUNTER — Emergency Department: Payer: Self-pay | Admitting: Emergency Medicine

## 2014-03-16 LAB — URINALYSIS, COMPLETE
BACTERIA: NONE SEEN
BILIRUBIN, UR: NEGATIVE
BILIRUBIN, UR: NEGATIVE
Glucose,UR: NEGATIVE
Glucose,UR: NEGATIVE mg/dL (ref 0–75)
KETONE: NEGATIVE
Ketone: NEGATIVE
Nitrite: NEGATIVE
Nitrite: NEGATIVE
Ph: 5.5 (ref 5.0–8.0)
Ph: 5 (ref 4.5–8.0)
Protein: 30
Protein: 30
RBC,UR: 4576 /HPF (ref 0–5)
SPECIFIC GRAVITY: 1.025 (ref 1.000–1.030)
SPECIFIC GRAVITY: 1.019 (ref 1.003–1.030)
Squamous Epithelial: 37

## 2014-03-16 LAB — CBC WITH DIFFERENTIAL/PLATELET
Basophil #: 0.1 10*3/uL (ref 0.0–0.1)
Basophil %: 0.8 %
EOS ABS: 0.2 10*3/uL (ref 0.0–0.7)
EOS PCT: 1.9 %
HCT: 41 % (ref 35.0–47.0)
HGB: 13.7 g/dL (ref 12.0–16.0)
Lymphocyte #: 3.2 10*3/uL (ref 1.0–3.6)
Lymphocyte %: 24 %
MCH: 30.7 pg (ref 26.0–34.0)
MCHC: 33.4 g/dL (ref 32.0–36.0)
MCV: 92 fL (ref 80–100)
Monocyte #: 0.7 x10 3/mm (ref 0.2–0.9)
Monocyte %: 5.6 %
Neutrophil #: 9 10*3/uL — ABNORMAL HIGH (ref 1.4–6.5)
Neutrophil %: 67.7 %
Platelet: 231 10*3/uL (ref 150–440)
RBC: 4.45 10*6/uL (ref 3.80–5.20)
RDW: 13 % (ref 11.5–14.5)
WBC: 13.2 10*3/uL — ABNORMAL HIGH (ref 3.6–11.0)

## 2014-03-16 LAB — COMPREHENSIVE METABOLIC PANEL
ALT: 12 U/L — AB
ANION GAP: 7 (ref 7–16)
Albumin: 3.7 g/dL (ref 3.4–5.0)
Alkaline Phosphatase: 79 U/L
BILIRUBIN TOTAL: 0.2 mg/dL (ref 0.2–1.0)
BUN: 14 mg/dL (ref 7–18)
CALCIUM: 9.3 mg/dL (ref 8.5–10.1)
CO2: 28 mmol/L (ref 21–32)
CREATININE: 0.91 mg/dL (ref 0.60–1.30)
Chloride: 102 mmol/L (ref 98–107)
EGFR (African American): >60
EGFR (Non-African Amer.): >60
GLUCOSE: 115 mg/dL — AB (ref 65–99)
Osmolality: 275 (ref 275–301)
Potassium: 4.4 mmol/L (ref 3.5–5.1)
SGOT(AST): 11 U/L — ABNORMAL LOW (ref 15–37)
SODIUM: 137 mmol/L (ref 136–145)
TOTAL PROTEIN: 7.1 g/dL (ref 6.4–8.2)

## 2014-03-16 LAB — LIPASE, BLOOD: Lipase: 228 U/L (ref 73–393)

## 2014-03-16 LAB — AMYLASE: Amylase: 40 U/L (ref 25–115)

## 2014-03-16 LAB — WET PREP, GENITAL

## 2014-03-16 LAB — PREGNANCY, URINE: Pregnancy Test, Urine: NEGATIVE m[IU]/mL

## 2014-03-19 LAB — GC/CHLAMYDIA PROBE AMP

## 2014-03-20 ENCOUNTER — Emergency Department: Payer: Self-pay | Admitting: Emergency Medicine

## 2014-03-20 LAB — CBC WITH DIFFERENTIAL/PLATELET
BASOS PCT: 0.7 %
Basophil #: 0.1 10*3/uL (ref 0.0–0.1)
EOS ABS: 0.3 10*3/uL (ref 0.0–0.7)
Eosinophil %: 3.5 %
HCT: 40.1 % (ref 35.0–47.0)
HGB: 13.5 g/dL (ref 12.0–16.0)
LYMPHS ABS: 2.7 10*3/uL (ref 1.0–3.6)
LYMPHS PCT: 31 %
MCH: 31.4 pg (ref 26.0–34.0)
MCHC: 33.8 g/dL (ref 32.0–36.0)
MCV: 93 fL (ref 80–100)
MONO ABS: 0.5 x10 3/mm (ref 0.2–0.9)
Monocyte %: 5.7 %
NEUTROS ABS: 5.1 10*3/uL (ref 1.4–6.5)
NEUTROS PCT: 59.1 %
PLATELETS: 231 10*3/uL (ref 150–440)
RBC: 4.32 10*6/uL (ref 3.80–5.20)
RDW: 13 % (ref 11.5–14.5)
WBC: 8.7 10*3/uL (ref 3.6–11.0)

## 2014-03-20 LAB — COMPREHENSIVE METABOLIC PANEL
AST: 22 U/L (ref 15–37)
Albumin: 3.6 g/dL (ref 3.4–5.0)
Alkaline Phosphatase: 86 U/L
Anion Gap: 7 (ref 7–16)
BILIRUBIN TOTAL: 0.1 mg/dL — AB (ref 0.2–1.0)
BUN: 17 mg/dL (ref 7–18)
CALCIUM: 8.8 mg/dL (ref 8.5–10.1)
CHLORIDE: 107 mmol/L (ref 98–107)
Co2: 26 mmol/L (ref 21–32)
Creatinine: 0.89 mg/dL (ref 0.60–1.30)
EGFR (African American): >60
Glucose: 136 mg/dL — ABNORMAL HIGH (ref 65–99)
Osmolality: 283 (ref 275–301)
Potassium: 3.8 mmol/L (ref 3.5–5.1)
SGPT (ALT): 18 U/L
Sodium: 140 mmol/L (ref 136–145)
Total Protein: 7.2 g/dL (ref 6.4–8.2)

## 2014-03-20 LAB — URINALYSIS, COMPLETE
BLOOD: NEGATIVE
Bilirubin,UR: NEGATIVE
GLUCOSE, UR: NEGATIVE mg/dL (ref 0–75)
KETONE: NEGATIVE
Nitrite: NEGATIVE
PROTEIN: NEGATIVE
Ph: 7 (ref 4.5–8.0)
RBC,UR: 3 /HPF (ref 0–5)
Specific Gravity: 1.017 (ref 1.003–1.030)
Squamous Epithelial: 12
WBC UR: 2 /HPF (ref 0–5)

## 2014-03-20 LAB — LIPASE, BLOOD: Lipase: 308 U/L (ref 73–393)

## 2014-05-16 ENCOUNTER — Emergency Department: Payer: Self-pay | Admitting: Internal Medicine

## 2014-05-16 LAB — COMPREHENSIVE METABOLIC PANEL
ALK PHOS: 90 U/L
ALT: 21 U/L
Albumin: 3.8 g/dL (ref 3.4–5.0)
Anion Gap: 7 (ref 7–16)
BUN: 10 mg/dL (ref 7–18)
Bilirubin,Total: 0.2 mg/dL (ref 0.2–1.0)
CREATININE: 0.84 mg/dL (ref 0.60–1.30)
Calcium, Total: 9.4 mg/dL (ref 8.5–10.1)
Chloride: 105 mmol/L (ref 98–107)
Co2: 26 mmol/L (ref 21–32)
EGFR (Non-African Amer.): >60
Glucose: 94 mg/dL (ref 65–99)
Osmolality: 274 (ref 275–301)
Potassium: 4 mmol/L (ref 3.5–5.1)
SGOT(AST): 19 U/L (ref 15–37)
SODIUM: 138 mmol/L (ref 136–145)
TOTAL PROTEIN: 7.5 g/dL (ref 6.4–8.2)

## 2014-05-16 LAB — CBC WITH DIFFERENTIAL/PLATELET
BASOS PCT: 0.4 %
Basophil #: 0 10*3/uL (ref 0.0–0.1)
Eosinophil #: 0.3 10*3/uL (ref 0.0–0.7)
Eosinophil %: 3.4 %
HCT: 44.7 % (ref 35.0–47.0)
HGB: 14.9 g/dL (ref 12.0–16.0)
LYMPHS ABS: 2.1 10*3/uL (ref 1.0–3.6)
Lymphocyte %: 21.4 %
MCH: 30.6 pg (ref 26.0–34.0)
MCHC: 33.5 g/dL (ref 32.0–36.0)
MCV: 91 fL (ref 80–100)
Monocyte #: 0.5 x10 3/mm (ref 0.2–0.9)
Monocyte %: 5.4 %
Neutrophil #: 6.9 10*3/uL — ABNORMAL HIGH (ref 1.4–6.5)
Neutrophil %: 69.4 %
PLATELETS: 250 10*3/uL (ref 150–440)
RBC: 4.89 10*6/uL (ref 3.80–5.20)
RDW: 13.5 % (ref 11.5–14.5)
WBC: 10 10*3/uL (ref 3.6–11.0)

## 2014-05-16 LAB — URINALYSIS, COMPLETE
BLOOD: NEGATIVE
Bilirubin,UR: NEGATIVE
GLUCOSE, UR: NEGATIVE mg/dL (ref 0–75)
Ketone: NEGATIVE
Leukocyte Esterase: NEGATIVE
NITRITE: NEGATIVE
PROTEIN: NEGATIVE
Ph: 5 (ref 4.5–8.0)
RBC,UR: <1 /HPF (ref 0–5)
SPECIFIC GRAVITY: 1.025 (ref 1.003–1.030)
Squamous Epithelial: 8
WBC UR: 1 /HPF (ref 0–5)

## 2014-05-16 LAB — LIPASE, BLOOD: LIPASE: 192 U/L (ref 73–393)

## 2014-06-08 ENCOUNTER — Emergency Department: Payer: Self-pay | Admitting: Student
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
## 2014-08-18 NOTE — Consult Note (Signed)
EGD was completely normal. No ulcer. No stricture, though esophagus dilated to 54 Fr and gastric bx taken for H.pylori. Low residue diet ordered. CT neg for pancreatitis or diverticulitis. Low residue diet ordered. Continue daily PPI due to recent NSAIDS use. Add bentyl tid and see if pain improves. thanks  Electronic Signatures: Lutricia Feilh, Kharizma Lesnick (MD)  (Signed on 18-Dec-14 11:54)  Authored  Last Updated: 18-Dec-14 11:54 by Lutricia Feilh, Jahari Billy (MD)

## 2014-08-18 NOTE — Consult Note (Signed)
Pt seen and examined. Please see Natasha [name]'s notes. Abd pain with nausea. Recent motrin use. Elevated lipase, which may be incidental. More tender in LLQ area. Dysphagia to solids. Will try to get CT of abd tonight and EGD tomorrow AM. Thanks.  Electronic Signatures: Lutricia Feilh, Lakyla Biswas (MD)  (Signed on 17-Dec-14 14:22)  Authored  Last Updated: 17-Dec-14 14:22 by Lutricia Feilh, Tonga Prout (MD)

## 2014-08-19 NOTE — Discharge Summary (Signed)
PATIENT NAME:  Natasha [name], Natasha [name] MR#:  [id number] DATE OF BIRTH:  August 03, 1985  DATE OF ADMISSION:  04/12/2013 DATE OF DISCHARGE:  04/14/2013  ADMITTING PHYSICIAN:  Dr. Anselm Jungling.  DISCHARGING PHYSICIAN:  Gladstone Lighter, MD   PRIMARY CARE PHYSICIAN:  Currently none.   CONSULTATIONS IN HOSPITAL:  GI consultation by Dr. Candace Cruise.   DISCHARGE DIAGNOSES:  1.  Nephrolithiasis and abdominal pain related to that.  2.  Gastritis.  3.  Urinary tract infection. 4.  Depression and anxiety.  5.  Prior esophageal stricture status post dilatation.  DISCHARGE HOME MEDICATIONS: 1.  Gabapentin 300 mg p.o. 3 times a day.  2.  Trazodone 100 mg p.o. bedtime.  3.  Zoloft 25 mg p.o. daily.  4.  Klonopin 5 mg p.o. b.i.d.  5.  Bentyl 10 mg p.o. 4 times a day.  6.  Prilosec 40 mg p.o. b.i.d.  7.  Percocet 300 mg q.8 hours p.r.n. for pain.  8.  Levaquin 250 mg p.o. daily.   DISCHARGE DIET:  Regular diet.   DISCHARGE ACTIVITY:  As tolerated.   FOLLOWUP INSTRUCTIONS:  1.  PCP followup in 2 weeks.  2.  Urology followup in 1 week for renal stones and hematuria.  3.  Advised to drink plenty of water.  LABORATORY AND IMAGING STUDIES PRIOR TO DISCHARGE:  Upper GI endoscopy done 04/14/2013, showing no stricture, no ulcer, completely normal esophagus and also stomach; however, biopsies were taken.   Urinalysis 3+ leukocyte esterase, 1+ bacteria, 300 WBCs.   She has 2+ blood but only 35 RBCs.   WBC 11.4, hemoglobin 13.4, hematocrit 38.8, platelet count 208.   Sodium 141, potassium 3.4, chloride 109, bicarb 27, BUN 11, creatinine 0.72, glucose 97.   Calcium 9.1.   ALT 11, AST 14, alk phos 58, total bili 0.4 and albumin of 3.2. Lipase on admission was elevated in the 500 range, but came down to normal 235, beta-HCG is negative. Abdominal ultrasound showing status post for cholecystectomy. No biliary duct dilatation. CT of the abdomen and pelvis done with contrast showing no abdominal or pelvic findings which  are acute, stable bilateral nephrolithiasis. No evidence of ureteral calculus or hydronephrosis noted.   BRIEF HOSPITAL COURSE:  Ms. Natasha [name] is a 29 year old Caucasian female with past medical history significant for history of a esophageal stricture and dilatation, a prior history of nephrolithiasis, who presents to the hospital secondary to abdominal pain.   1.  Abdominal pain, initially thought to be acute pancreatitis with elevated lipase; however, the lipase normalized within a day. The patient does not have nausea or vomiting, has been tolerating diet well. Then it was thought it was probably from gastritis because of her recent use of NSAIDs and prior esophageal stricture and reflux problems. So she was seen by GI. She had an upper GI endoscopy done, which was completely normal. She will continue to take her PPI.  2.  Probably her abdominal pain in the flank is secondary to nephrolithiasis.  3.  Nephrolithiasis, which was stable. No ureteral calculus or obstruction seen, no hydronephrosis seen. She will follow up with Urology as an outpatient. The urine did reveal that she has UTIs and cultures were ordered. She is being discharged on Levaquin at this time.  4.  Tobacco use disorder. She was on Nicotrol inhaler and nicotine patch while in the hospital.   5.  Her course has been otherwise uneventful in the hospital. The patient has been requiring IV pain medications consistently though she was not  visibly in pain. She is being discharged on a 5-day supply of Percocet.   DISCHARGE CONDITION:  Stable.   DISCHARGE DISPOSITION:  Home.   TIME SPENT ON DISCHARGE:  40 minutes.   ____________________________ Gladstone Lighter, MD rk:jm D: 04/14/2013 15:57:32 ET T: 04/14/2013 16:26:23 ET JOB#: [id number]  cc: Gladstone Lighter, MD, <Dictator> Gladstone Lighter MD ELECTRONICALLY SIGNED 05/04/2013 7:22
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
## 2014-09-01 IMAGING — CR DG LUMBAR SPINE 2-3V
1 series · 3 of 3 positions shown · non-contrast
Comparison: Lumbar spine radiographs 09/01/2013. Abdominal CT
04/13/2013.

CLINICAL DATA: Low back and left hip pain for 3 or 4 days. No known
injury.

EXAM:
LUMBAR SPINE - 2-3 VIEW

[Series 1: t lumbar l-5 s-1 spot · 0.14mm/px · 3 of 3 slices shown]
[im 1/3]
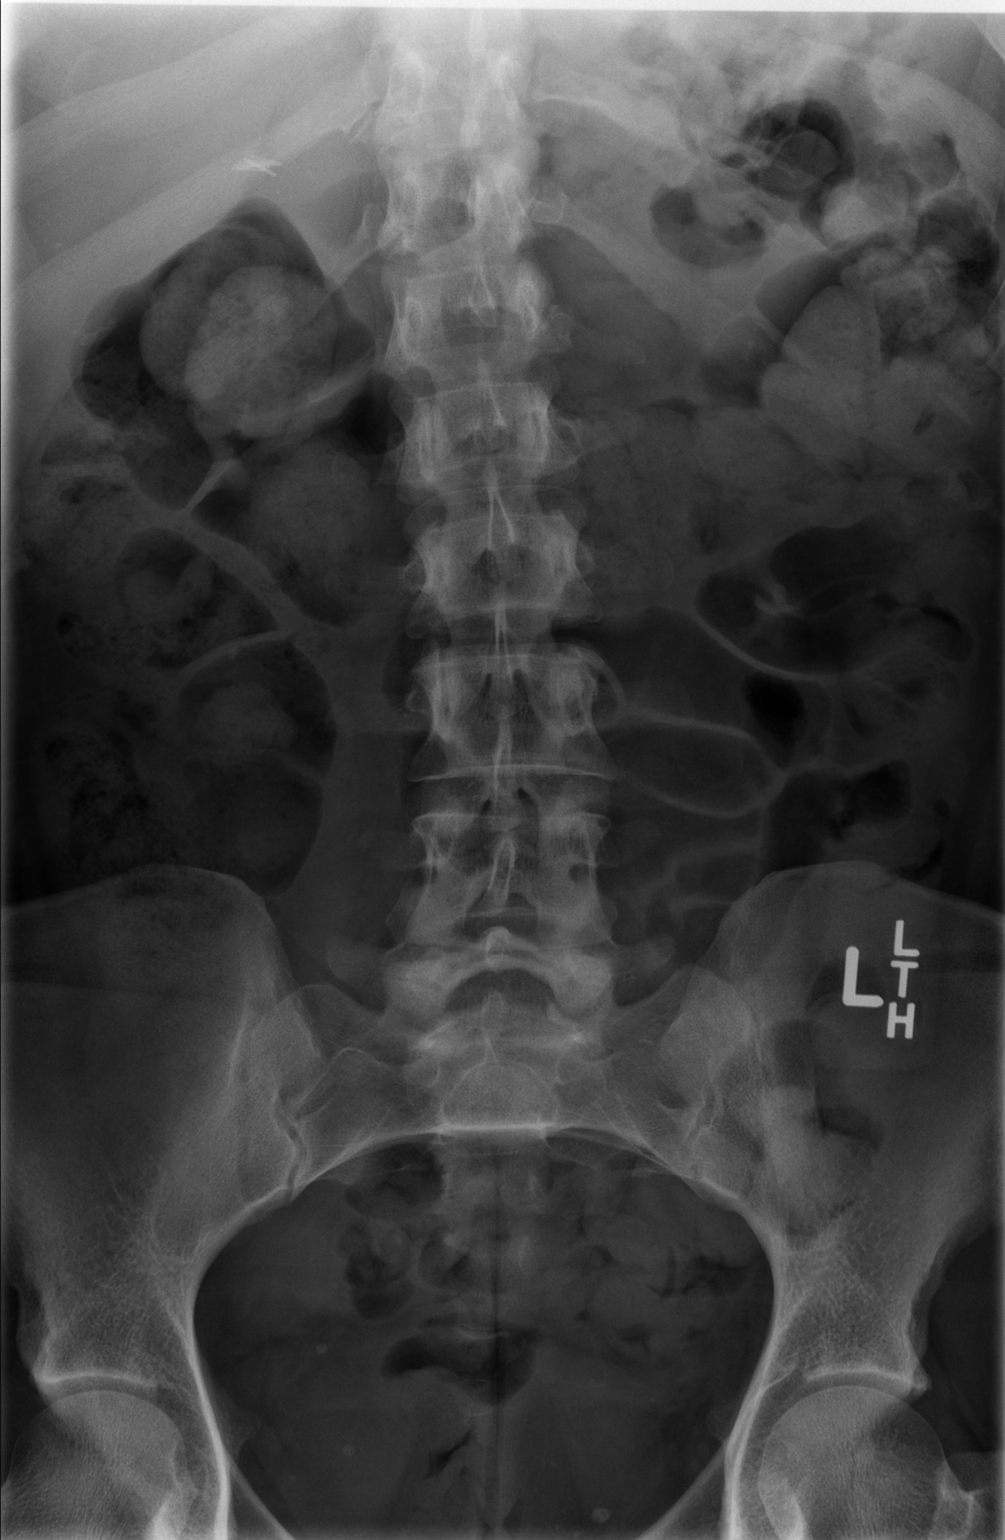
[im 2/3]
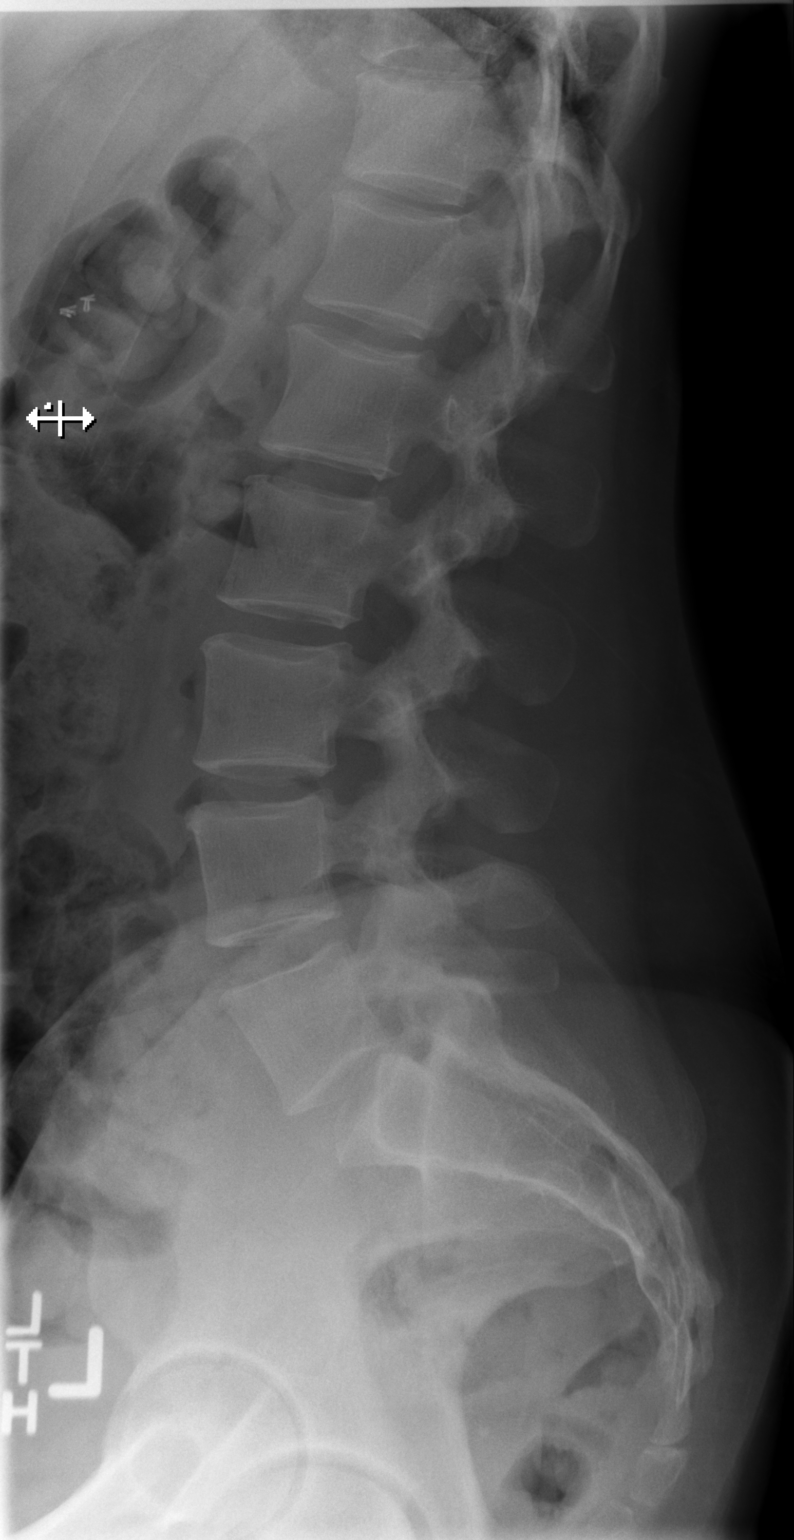
[im 3/3]
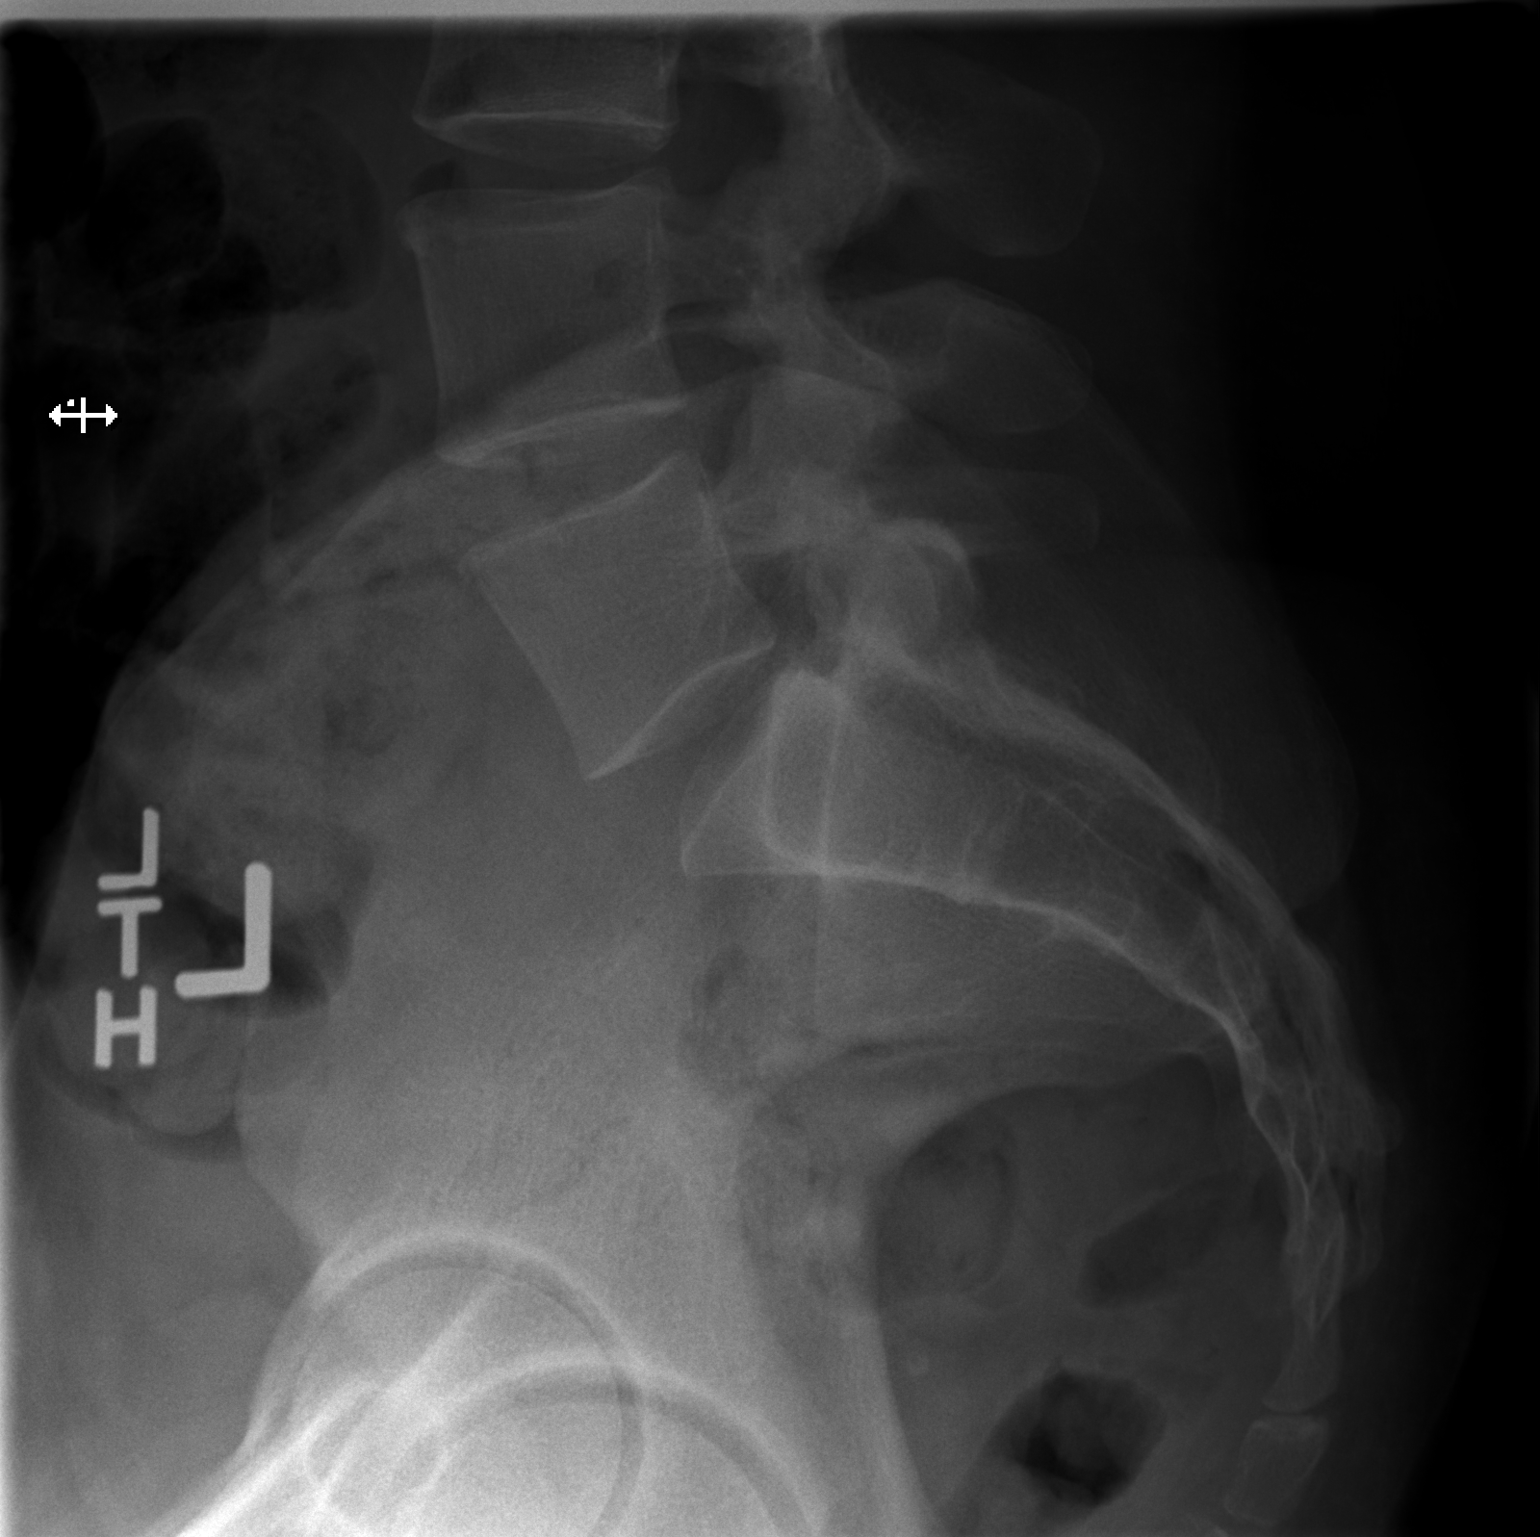

[3 of 3 positions shown; findings below may reference images not displayed]

FINDINGS: There are small ribs at T12 and 5 lumbar type vertebral bodies.
There is a mild convex left scoliosis. The lateral alignment is
normal. There is no evidence of acute fracture or pars defect.
IMPRESSION: Mild scoliosis. No acute osseous findings or significant
spondylosis.

## 2014-09-15 ENCOUNTER — Encounter: Payer: Self-pay | Admitting: Emergency Medicine

## 2014-09-15 ENCOUNTER — Emergency Department: Payer: 59

## 2014-09-15 ENCOUNTER — Emergency Department
Admission: EM | Admit: 2014-09-15 | Discharge: 2014-09-15 | Disposition: A | Discharge status: Home / Self Care | Payer: 59 | Attending: Emergency Medicine | Admitting: Emergency Medicine

## 2014-09-15 DIAGNOSIS — Z9049 Acquired absence of other specified parts of digestive tract: Secondary | ICD-10-CM | POA: Insufficient documentation

## 2014-09-15 DIAGNOSIS — Z3202 Encounter for pregnancy test, result negative: Secondary | ICD-10-CM | POA: Diagnosis not present

## 2014-09-15 DIAGNOSIS — Z720 Tobacco use: Secondary | ICD-10-CM | POA: Insufficient documentation

## 2014-09-15 DIAGNOSIS — R140 Abdominal distension (gaseous): Secondary | ICD-10-CM | POA: Diagnosis not present

## 2014-09-15 DIAGNOSIS — K5901 Slow transit constipation: Secondary | ICD-10-CM

## 2014-09-15 DIAGNOSIS — K5900 Constipation, unspecified: Secondary | ICD-10-CM | POA: Diagnosis present

## 2014-09-15 DIAGNOSIS — Z79899 Other long term (current) drug therapy: Secondary | ICD-10-CM | POA: Insufficient documentation

## 2014-09-15 HISTORY — DX: Restless legs syndrome: G25.81

## 2014-09-15 LAB — URINALYSIS COMPLETE WITH MICROSCOPIC (ARMC ONLY)
BILIRUBIN URINE: NEGATIVE
Bacteria, UA: NONE SEEN
GLUCOSE, UA: NEGATIVE mg/dL
Ketones, ur: NEGATIVE mg/dL
Nitrite: NEGATIVE
PH: 5 (ref 5.0–8.0)
Protein, ur: NEGATIVE mg/dL
Specific Gravity, Urine: 1.019 (ref 1.005–1.030)

## 2014-09-15 LAB — COMPREHENSIVE METABOLIC PANEL
ALBUMIN: 4.4 g/dL (ref 3.5–5.0)
ALK PHOS: 75 U/L (ref 38–126)
ALT: 12 U/L — ABNORMAL LOW (ref 14–54)
AST: 18 U/L (ref 15–41)
Anion gap: 6 (ref 5–15)
BUN: 11 mg/dL (ref 6–20)
CHLORIDE: 104 mmol/L (ref 101–111)
CO2: 28 mmol/L (ref 22–32)
CREATININE: 0.77 mg/dL (ref 0.44–1.00)
Calcium: 9.5 mg/dL (ref 8.9–10.3)
GFR calc Af Amer: >60 mL/min (ref >60)
GFR calc non Af Amer: >60 mL/min (ref >60)
Glucose, Bld: 100 mg/dL — ABNORMAL HIGH (ref 65–99)
Potassium: 4.1 mmol/L (ref 3.5–5.1)
Sodium: 138 mmol/L (ref 135–145)
Total Bilirubin: 0.2 mg/dL — ABNORMAL LOW (ref 0.3–1.2)
Total Protein: 7.8 g/dL (ref 6.5–8.1)

## 2014-09-15 LAB — CBC WITH DIFFERENTIAL/PLATELET
Basophils Absolute: 0 10*3/uL (ref 0–0.1)
Basophils Relative: 1 %
EOS ABS: 0.1 10*3/uL (ref 0–0.7)
Eosinophils Relative: 2 %
HEMATOCRIT: 43.3 % (ref 35.0–47.0)
HEMOGLOBIN: 14.9 g/dL (ref 12.0–16.0)
LYMPHS ABS: 2 10*3/uL (ref 1.0–3.6)
Lymphocytes Relative: 33 %
MCH: 31.7 pg (ref 26.0–34.0)
MCHC: 34.5 g/dL (ref 32.0–36.0)
MCV: 91.9 fL (ref 80.0–100.0)
MONO ABS: 0.4 10*3/uL (ref 0.2–0.9)
MONOS PCT: 6 %
Neutro Abs: 3.5 10*3/uL (ref 1.4–6.5)
Neutrophils Relative %: 58 %
Platelets: 208 10*3/uL (ref 150–440)
RBC: 4.71 MIL/uL (ref 3.80–5.20)
RDW: 14.3 % (ref 11.5–14.5)
WBC: 6.1 10*3/uL (ref 3.6–11.0)

## 2014-09-15 LAB — POCT PREGNANCY, URINE: PREG TEST UR: NEGATIVE

## 2014-09-15 MED ORDER — GLYCERIN (LAXATIVE) 2 G RE SUPP: 1.0000 | Freq: Once | RECTAL | Status: DC

## 2014-09-15 MED ORDER — MAGNESIUM CITRATE PO SOLN: 1.0000 | Freq: Once | ORAL | Status: DC

## 2014-09-15 NOTE — Discharge Instructions (Signed)

## 2014-09-15 NOTE — ED Notes (Signed)
Patient to ED with reports of constipation issues over the last month, patient reports she has only moved bowels 3 times in the last 25 days. Reports that over the last few days she has noted increased swelling in her abdomen and increasing pain on the right side.

## 2014-09-15 NOTE — ED Provider Notes (Signed)
[facility] Emergency Department Provider Note  ____________________________________________  Time seen: Approximately 3:40 PM  I have reviewed the triage vital signs and the nursing notes.   HISTORY  Chief Complaint Constipation    HPI Natasha [name] is a 29 y.o. female patient complaining of intermittent constipation for one month. Patient states she is on a head 3 bowel movements in the past 25 days. Patient state her abdomen feels distended. States stools are hard. Has tried over-the-counter suppositories and laxatives with no relief. Patient denies any fever or chills. There is transit nausea but no vomiting.Patient is rating her pain discomfort as a 10 over 10.   Past Medical History  Diagnosis Date  . Restless leg syndrome unk    There are no active problems to display for this patient.   Past Surgical History  Procedure Laterality Date  . Cholecystectomy      Current Outpatient Rx  Name  Route  Sig  Dispense  Refill  . busPIRone (BUSPAR) 10 MG tablet   Oral   Take 20 mg by mouth 2 (two) times daily.         Marland Kitchen. gabapentin (NEURONTIN) 600 MG tablet   Oral   Take 600 mg by mouth 3 (three) times daily.         Marland Kitchen. omeprazole (PRILOSEC) 40 MG capsule   Oral   Take 40 mg by mouth 2 (two) times daily.         . sertraline (ZOLOFT) 100 MG tablet   Oral   Take 200 mg by mouth daily.         Marland Kitchen. glycerin adult (GLYCERIN ADULT) 2 G SUPP   Rectal   Place 1 suppository rectally once.   10 suppository   0   . magnesium citrate SOLN   Oral   Take 296 mLs (1 Bottle total) by mouth once.   195 mL   0     Allergies Toradol  History reviewed. No pertinent family history.  Social History History  Substance Use Topics  . Smoking status: Current Every Day Smoker  . Smokeless tobacco: Never Used  . Alcohol Use: Yes     Comment: occasional    Review of Systems Constitutional: No fever/chills. Mild distress Eyes: No visual  changes. ENT: No sore throat. Cardiovascular: Denies chest pain. Respiratory: Denies shortness of breath. Gastrointestinal: No abdominal pain.  Nausea, no vomiting.  No diarrhea.  Constipation. Genitourinary: Negative for dysuria. Musculoskeletal: Negative for back pain. Skin: Negative for rash. Neurological: Negative for headaches, focal weakness or numbness. 10-point ROS otherwise negative.  ____________________________________________   PHYSICAL EXAM:  VITAL SIGNS: ED Triage Vitals  Enc Vitals Group     BP 09/15/14 1303 110/86 mmHg     Pulse Rate 09/15/14 1303 63     Resp 09/15/14 1303 20     Temp 09/15/14 1303 98.2 F (36.8 C)     Temp Source 09/15/14 1303 Oral     SpO2 09/15/14 1303 100 %     Weight 09/15/14 1303 180 lb (81.647 kg)     Height 09/15/14 1303 5\' 5"  (1.651 m)     Head Cir --      Peak Flow --      Pain Score 09/15/14 1304 10     Pain Loc --      Pain Edu? --      Excl. in GC? --     Constitutional: Alert and oriented. Well appearing and in mild distress.  Eyes: Conjunctivae are normal. PERRL. EOMI. Head: Atraumatic. Nose: No congestion/rhinnorhea. Mouth/Throat: Mucous membranes are moist.  Oropharynx non-erythematous. Neck: No stridor.   Hematological/Lymphatic/Immunilogical: No cervical lymphadenopathy. Cardiovascular: Normal rate, regular rhythm. Grossly normal heart sounds.  Good peripheral circulation. Respiratory: Normal respiratory effort.  No retractions. Lungs CTAB. Gastrointestinal: Soft and nontender. Mild distention. No abdominal bruits. No CVA tenderness. Decreased bowel sounds Musculoskeletal: No lower extremity tenderness nor edema.  No joint effusions. Neurologic:  Normal speech and language. No gross focal neurologic deficits are appreciated. Speech is normal. No gait instability. Skin:  Skin is warm, dry and intact. No rash noted. Psychiatric: Mood and affect are normal. Speech and behavior are  normal.  ____________________________________________   LABS (all labs ordered are listed, but only abnormal results are displayed)  Labs Reviewed  COMPREHENSIVE METABOLIC PANEL - Abnormal; Notable for the following:    Glucose, Bld 100 (*)    ALT 12 (*)    Total Bilirubin 0.2 (*)    All other components within normal limits  URINALYSIS COMPLETEWITH MICROSCOPIC ([facility])  - Abnormal; Notable for the following:    Color, Urine YELLOW (*)    APPearance HAZY (*)    Hgb urine dipstick 1+ (*)    Leukocytes, UA 1+ (*)    Squamous Epithelial / LPF 6-30 (*)    All other components within normal limits  CBC WITH DIFFERENTIAL/PLATELET  POC URINE PREG, ED  POCT PREGNANCY, URINE   ____________________________________________  EKG   ____________________________________________  RADIOLOGY  No obstruction. ____________________________________________   PROCEDURES  Procedure(s) performed: None  Critical Care performed: No  ____________________________________________   INITIAL IMPRESSION / ASSESSMENT AND PLAN / ED COURSE  Pertinent labs & imaging results that were available during my care of the patient were reviewed by me and considered in my medical decision making (see chart for details).  Constipation ____________________________________________   FINAL CLINICAL IMPRESSION(S) / ED DIAGNOSES  Final diagnoses:  Constipation due to slow transit      Joni Reiningonald K Ernesto Zukowski, PA-C 09/15/14 1552  Jene Everyobert Kinner, MD 09/16/14 [id number]

## 2014-09-17 ENCOUNTER — Emergency Department
Admission: EM | Admit: 2014-09-17 | Discharge: 2014-09-17 | Discharge status: Against Medical Advice | Payer: 59 | Attending: Emergency Medicine | Admitting: Emergency Medicine

## 2014-09-17 ENCOUNTER — Encounter: Payer: Self-pay | Admitting: Emergency Medicine

## 2014-09-17 DIAGNOSIS — K5900 Constipation, unspecified: Secondary | ICD-10-CM | POA: Diagnosis present

## 2014-09-17 DIAGNOSIS — Z720 Tobacco use: Secondary | ICD-10-CM | POA: Diagnosis not present

## 2014-09-17 DIAGNOSIS — K5909 Other constipation: Secondary | ICD-10-CM | POA: Diagnosis not present

## 2014-09-17 DIAGNOSIS — R1084 Generalized abdominal pain: Secondary | ICD-10-CM | POA: Diagnosis not present

## 2014-09-17 DIAGNOSIS — Z79899 Other long term (current) drug therapy: Secondary | ICD-10-CM | POA: Diagnosis not present

## 2014-09-17 MED ORDER — SENNA 8.6 MG PO TABS: 2.0000 | ORAL_TABLET | Freq: Two times a day (BID) | ORAL | Status: DC

## 2014-09-17 MED ORDER — POLYETHYLENE GLYCOL 3350 17 G PO PACK: 51.0000 g | PACK | Freq: Once | ORAL | Status: DC

## 2014-09-17 MED ORDER — SENNA 8.6 MG PO TABS: 2.0000 | ORAL_TABLET | Freq: Once | ORAL | Status: DC

## 2014-09-17 MED ORDER — POLYETHYLENE GLYCOL 3350 17 GM/SCOOP PO POWD: ORAL | Status: DC

## 2014-09-17 MED ORDER — DOCUSATE SODIUM 100 MG PO CAPS: 200.0000 mg | ORAL_CAPSULE | Freq: Two times a day (BID) | ORAL | Status: DC

## 2014-09-17 NOTE — ED Notes (Addendum)
Pt left ED after being seen by the MD pt left without speaking to RN or MD. Natasha MemsUnable to update vitals or ubtain signature.

## 2014-09-17 NOTE — Discharge Instructions (Signed)
Constipation °Constipation is when a person has fewer than three bowel movements a week, has difficulty having a bowel movement, or has stools that are dry, hard, or larger than normal. As people grow older, constipation is more common. If you try to fix constipation with medicines that make you have a bowel movement (laxatives), the problem may get worse. Long-term laxative use may cause the muscles of the colon to become weak. A low-fiber diet, not taking in enough fluids, and taking certain medicines may make constipation worse.  °CAUSES  °· Certain medicines, such as antidepressants, pain medicine, iron supplements, antacids, and water pills.   °· Certain diseases, such as diabetes, irritable bowel syndrome (IBS), thyroid disease, or depression.   °· Not drinking enough water.   °· Not eating enough fiber-rich foods.   °· Stress or travel.   °· Lack of physical activity or exercise.   °· Ignoring the urge to have a bowel movement.   °· Using laxatives too much.   °SIGNS AND SYMPTOMS  °· Having fewer than three bowel movements a week.   °· Straining to have a bowel movement.   °· Having stools that are hard, dry, or larger than normal.   °· Feeling full or bloated.   °· Pain in the lower abdomen.   °· Not feeling relief after having a bowel movement.   °DIAGNOSIS  °Your health care provider will take a medical history and perform a physical exam. Further testing may be done for severe constipation. Some tests may include: °· A barium enema X-ray to examine your rectum, colon, and, sometimes, your small intestine.   °· A sigmoidoscopy to examine your lower colon.   °· A colonoscopy to examine your entire colon. °TREATMENT  °Treatment will depend on the severity of your constipation and what is causing it. Some dietary treatments include drinking more fluids and eating more fiber-rich foods. Lifestyle treatments may include regular exercise. If these diet and lifestyle recommendations do not help, your health care  provider may recommend taking over-the-counter laxative medicines to help you have bowel movements. Prescription medicines may be prescribed if over-the-counter medicines do not work.  °HOME CARE INSTRUCTIONS  °· Eat foods that have a lot of fiber, such as fruits, vegetables, whole grains, and beans. °· Limit foods high in fat and processed sugars, such as french fries, hamburgers, cookies, candies, and soda.   °· A fiber supplement may be added to your diet if you cannot get enough fiber from foods.   °· Drink enough fluids to keep your urine clear or pale yellow.   °· Exercise regularly or as directed by your health care provider.   °· Go to the restroom when you have the urge to go. Do not hold it.   °· Only take over-the-counter or prescription medicines as directed by your health care provider. Do not take other medicines for constipation without talking to your health care provider first.   °SEEK IMMEDIATE MEDICAL CARE IF:  °· You have bright red blood in your stool.   °· Your constipation lasts for more than 4 days or gets worse.   °· You have abdominal or rectal pain.   °· You have thin, pencil-like stools.   °· You have unexplained weight loss. °MAKE SURE YOU:  °· Understand these instructions. °· Will watch your condition. °· Will get help right away if you are not doing well or get worse. °Document Released: 01/11/2004 Document Revised: 04/19/2013 Document Reviewed: 01/24/2013 °[employer]® Patient Information ©2015 [employer]. This information is not intended to replace advice given to you by your health care provider. Make sure you discuss any questions   you have with your health care provider.  You were seen in the ED today for severe constipation. Take high-dose MiraLAX by taking 3 in 12 ounces of water 3 times a day. You need to do this every day until you're having multiple bowel movements a day. Your bowel movements will become very loose. Also, be sure your drinking a lot of water. He should be  drinking at least 10 glasses of clear water a day. Continue taking senna and Colace twice a day to help maintain long-term control of your constipation once things get better. You may also try taking 1 or 2 bottles of 300 mL of magnesium citrate. This is not a replacement for MiraLAX but can be taken in addition.

## 2014-09-17 NOTE — ED Notes (Signed)
Pt informed of treatment plan and reports feeling as though this is not the best approach. Pt reports contemplating leaving the ED verbalizing frustration with being placed on laxatives and enemas, when according to the pt this has been the treatment plan for a month without relief. MD notified of situation.

## 2014-09-17 NOTE — ED Provider Notes (Addendum)
[facility] Emergency Department Provider Note  ____________________________________________  Time seen: 1:20 PM  I have reviewed the triage vital signs and the nursing notes.   HISTORY  Chief Complaint Constipation    HPI Natasha [name] is a 29 y.o. female who complains of feeling constipated for the past month. She reports having 3 small bowel movements over the last month. She was here in the ED 2 days ago, given mag citrate without relief. She has been trying suppositories, senna, Colace, and enemas at home without relief. She has had history of cholecystectomy and tubal ligation but no bowel surgeries. Denies any fever, nausea, vomiting or diarrhea. No pain or bleeding. Her bowel movements that she did have more soft. No chest pain or shortness of breath. She does not take any opioids and has had no trauma. She has generalized aching abdominal pain that is nonradiating and no aggravating or alleviating factors. She is able to eat and drink normally.     Past Medical History  Diagnosis Date  . Restless leg syndrome unk    There are no active problems to display for this patient.   Past Surgical History  Procedure Laterality Date  . Cholecystectomy    . Tubal ligation      Current Outpatient Rx  Name  Route  Sig  Dispense  Refill  . busPIRone (BUSPAR) 10 MG tablet   Oral   Take 20 mg by mouth 2 (two) times daily.         Marland Kitchen gabapentin (NEURONTIN) 600 MG tablet   Oral   Take 600 mg by mouth 3 (three) times daily.         Marland Kitchen glycerin adult (GLYCERIN ADULT) 2 G SUPP   Rectal   Place 1 suppository rectally once.   10 suppository   0   . magnesium citrate SOLN   Oral   Take 296 mLs (1 Bottle total) by mouth once.   195 mL   0   . omeprazole (PRILOSEC) 40 MG capsule   Oral   Take 40 mg by mouth 2 (two) times daily.         . sertraline (ZOLOFT) 100 MG tablet   Oral   Take 200 mg by mouth daily.            Allergies Toradol  No family history on file.  Social History History  Substance Use Topics  . Smoking status: Current Every Day Smoker    Types: Cigarettes  . Smokeless tobacco: Never Used  . Alcohol Use: No     Comment: occasional    Review of Systems  Constitutional: No fever or chills. No weight changes Eyes:No blurry vision or double vision.  ENT: No sore throat. Cardiovascular: No chest pain. Respiratory: No dyspnea or cough. Gastrointestinal: Generalized abdominal pain as above, no vomiting and diarrhea.  No BRBPR or melena. Genitourinary: Negative for dysuria, urinary retention, bloody urine, or difficulty urinating. Musculoskeletal: Negative for back pain. No joint swelling or pain. Skin: Negative for rash. Neurological: Negative for headaches, focal weakness or numbness. Psychiatric:No anxiety or depression.   Endocrine:No hot/cold intolerance, changes in energy, or sleep difficulty.  10-point ROS otherwise negative.  ____________________________________________   PHYSICAL EXAM:  VITAL SIGNS: ED Triage Vitals  Enc Vitals Group     BP 09/17/14 1146 123/76 mmHg     Pulse Rate 09/17/14 1146 73     Resp 09/17/14 1146 19     Temp 09/17/14 1146 98.2 F (36.8  C)     Temp Source 09/17/14 1146 Oral     SpO2 09/17/14 1146 98 %     Weight 09/17/14 1146 180 lb (81.647 kg)     Height 09/17/14 1146  (1.651 m)     Head Cir --      Peak Flow --      Pain Score 09/17/14 1149 7     Pain Loc --      Pain Edu? --      Excl. in GC? --      Constitutional: Alert and oriented. Well appearing and in no distress. Eyes: No scleral icterus. No conjunctival pallor. PERRL. EOMI ENT   Head: Normocephalic and atraumatic.   Nose: No congestion/rhinnorhea. No septal hematoma   Mouth/Throat: MMM, no pharyngeal erythema. No peritonsillar mass. No uvula shift.   Neck: No stridor. No SubQ emphysema. No meningismus. Hematological/Lymphatic/Immunilogical:  No cervical lymphadenopathy. Cardiovascular: RRR. Normal and symmetric distal pulses are present in all extremities. No murmurs, rubs, or gallops. Respiratory: Normal respiratory effort without tachypnea nor retractions. Breath sounds are clear and equal bilaterally. No wheezes/rales/rhonchi. Gastrointestinal: Soft, mildly distended, nontender. There is no CVA tenderness.  No rebound, rigidity, or guarding. Genitourinary: deferred Musculoskeletal: Nontender with normal range of motion in all extremities. No joint effusions.  No lower extremity tenderness.  No edema. Neurologic:   Normal speech and language.  CN 2-10 normal. Motor grossly intact. No pronator drift.  Normal gait. No gross focal neurologic deficits are appreciated.  Skin:  Skin is warm, dry and intact. No rash noted.  No petechiae, purpura, or bullae. Psychiatric: Mood and affect are normal. Speech and behavior are normal. Patient exhibits appropriate insight and judgment.  ____________________________________________    LABS (pertinent positives/negatives) (all labs ordered are listed, but only abnormal results are displayed) Labs Reviewed - No data to display ____________________________________________   EKG    ____________________________________________    RADIOLOGY    ____________________________________________   PROCEDURES  ____________________________________________   INITIAL IMPRESSION / ASSESSMENT AND PLAN / ED COURSE  Pertinent labs & imaging results that were available during my care of the patient were reviewed by me and considered in my medical decision making (see chart for details).  Patient presents with symptoms consistent with constipation. It does not sound like she has a fecal impaction and she also notes that she is quite sure she does not have a fecal impaction and refuses rectal exam. At this time. No evidence of bowel obstruction and she is low risk for this. The abdomen is  overall benign. By history it sounds like the patient has not been drinking a lot of water. Despite her attempts to address her constipation.  I counseled her extensively on an effective GI regimen including high-dose MiraLAX, multiple times a day, large water intake, senna, Colace, and black coffee. The patient is otherwise well-appearing, no acute distress. His for appendicitis, AAA, diverticulitis, PID, STD, ectopic or pregnancy. We'll offer her an enema here, along with high-dose MiraLAX and senna to get things started. I will discharge her home with a clear plan for constipation management  ____________________________________________   FINAL CLINICAL IMPRESSION(S) / ED DIAGNOSES  Final diagnoses:  None  , chronic constipation    Sharman Cheek, MD 09/17/14 1358  ----------------------------------------- 2:19 PM on 09/17/2014 -----------------------------------------  Patient observed to just walked out of the emergency department without her discharge papers and prescriptions. I had our to counseled her on GI regimen and written up her discharge papers with prescriptions  just pending the enema which the patient refuses. On reviewing her chart in Epic care everywhere. 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  Sharman CheekPhillip Bobette Leyh, MD 09/17/14 1420

## 2014-09-17 NOTE — ED Notes (Signed)
Pt reports being seen here Friday for the same; was sent home with mag citrate and has had no relief. Pt has tried laxatives and suppositories since then. Pt reports not having a normal bowel movement in the past month. Pt appears very uncomfortable. Pt reports burping, able to pass gas, nausea. Pt also reports shortness of breath due to abdominal distention. Pt denies vomiting.

## 2014-10-02 DIAGNOSIS — F1210 Cannabis abuse, uncomplicated: Secondary | ICD-10-CM | POA: Insufficient documentation

## 2014-10-02 DIAGNOSIS — Z720 Tobacco use: Secondary | ICD-10-CM | POA: Diagnosis not present

## 2014-10-02 DIAGNOSIS — Z0289 Encounter for other administrative examinations: Secondary | ICD-10-CM | POA: Diagnosis present

## 2014-10-02 DIAGNOSIS — Z79899 Other long term (current) drug therapy: Secondary | ICD-10-CM | POA: Diagnosis not present

## 2014-10-03 ENCOUNTER — Encounter: Payer: Self-pay | Admitting: *Deleted

## 2014-10-03 ENCOUNTER — Emergency Department
Admission: EM | Admit: 2014-10-03 | Discharge: 2014-10-03 | Disposition: A | Discharge status: Home / Self Care | Payer: 59 | Attending: Emergency Medicine | Admitting: Emergency Medicine

## 2014-10-03 DIAGNOSIS — F1910 Other psychoactive substance abuse, uncomplicated: Secondary | ICD-10-CM

## 2014-10-03 HISTORY — DX: Other psychoactive substance abuse, uncomplicated: F19.10

## 2014-10-03 LAB — SALICYLATE LEVEL: Salicylate Lvl: <4 mg/dL (ref 2.8–30.0)

## 2014-10-03 LAB — COMPREHENSIVE METABOLIC PANEL
ALK PHOS: 63 U/L (ref 38–126)
ALT: 12 U/L — AB (ref 14–54)
AST: 18 U/L (ref 15–41)
Albumin: 4.2 g/dL (ref 3.5–5.0)
Anion gap: 6 (ref 5–15)
BILIRUBIN TOTAL: 0.5 mg/dL (ref 0.3–1.2)
BUN: 10 mg/dL (ref 6–20)
CALCIUM: 9.3 mg/dL (ref 8.9–10.3)
CHLORIDE: 104 mmol/L (ref 101–111)
CO2: 28 mmol/L (ref 22–32)
Creatinine, Ser: 0.81 mg/dL (ref 0.44–1.00)
GFR calc non Af Amer: >60 mL/min (ref >60)
GLUCOSE: 83 mg/dL (ref 65–99)
Potassium: 3.3 mmol/L — ABNORMAL LOW (ref 3.5–5.1)
SODIUM: 138 mmol/L (ref 135–145)
TOTAL PROTEIN: 7.1 g/dL (ref 6.5–8.1)

## 2014-10-03 LAB — ETHANOL: Alcohol, Ethyl (B): <5 mg/dL (ref <5)

## 2014-10-03 LAB — CBC
HCT: 40.6 % (ref 35.0–47.0)
Hemoglobin: 13.7 g/dL (ref 12.0–16.0)
MCH: 31.4 pg (ref 26.0–34.0)
MCHC: 33.8 g/dL (ref 32.0–36.0)
MCV: 92.9 fL (ref 80.0–100.0)
Platelets: 192 10*3/uL (ref 150–440)
RBC: 4.37 MIL/uL (ref 3.80–5.20)
RDW: 13.8 % (ref 11.5–14.5)
WBC: 7 10*3/uL (ref 3.6–11.0)

## 2014-10-03 LAB — ACETAMINOPHEN LEVEL

## 2014-10-03 NOTE — ED Notes (Signed)
Pt awake, ambulating in room without difficulty or distress noted; pt up to room commode; instructed on obtaining cc urine & pt voices good understanding

## 2014-10-03 NOTE — ED Notes (Addendum)
Pt voided in commode but did not obtain urine specimen as instructed; Dr Manson PasseyBrown in to speak with pt; pt admits to taking gabapentin and xanax
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
## 2014-10-03 NOTE — Discharge Instructions (Signed)
Polysubstance Abuse °When people abuse more than one drug or type of drug it is called polysubstance or polydrug abuse. For example, many smokers also drink alcohol. This is one form of polydrug abuse. Polydrug abuse also refers to the use of a drug to counteract an unpleasant effect produced by another drug. It may also be used to help with withdrawal from another drug. People who take stimulants may become agitated. Sometimes this agitation is countered with a tranquilizer. This helps protect against the unpleasant side effects. Polydrug abuse also refers to the use of different drugs at the same time.  °Anytime drug use is interfering with normal living activities, it has become abuse. This includes problems with family and friends. Psychological dependence has developed when your mind tells you that the drug is needed. This is usually followed by physical dependence which has developed when continuing increases of drug are required to get the same feeling or "high". This is known as addiction or chemical dependency. A person's risk is much higher if there is a history of chemical dependency in the family. °SIGNS OF CHEMICAL DEPENDENCY °· You have been told by friends or family that drugs have become a problem. °· You fight when using drugs. °· You are having blackouts (not remembering what you do while using). °· You feel sick from using drugs but continue using. °· You lie about use or amounts of drugs (chemicals) used. °· You need chemicals to get you going. °· You are suffering in work performance or in school because of drug use. °· You get sick from use of drugs but continue to use anyway. °· You need drugs to relate to people or feel comfortable in social situations. °· You use drugs to forget problems. °"Yes" answered to any of the above signs of chemical dependency indicates there are problems. The longer the use of drugs continues, the greater the problems will become. °If there is a family history of  drug or alcohol use, it is best not to experiment with these drugs. Continual use leads to tolerance. After tolerance develops more of the drug is needed to get the same feeling. This is followed by addiction. With addiction, drugs become the most important part of life. It becomes more important to take drugs than participate in the other usual activities of life. This includes relating to friends and family. Addiction is followed by dependency. Dependency is a condition where drugs are now needed not just to get high, but to feel normal. °Addiction cannot be cured but it can be stopped. This often requires outside help and the care of professionals. Treatment centers are listed in the yellow pages under: Cocaine, Narcotics, and Alcoholics Anonymous. Most hospitals and clinics can refer you to a specialized care center. Talk to your caregiver if you need help. °Document Released: 12/04/2004 Document Revised: 07/07/2011 Document Reviewed: 04/14/2005 °ExitCare® Patient Information ©2015 [employer]. This information is not intended to replace advice given to you by your health care provider. Make sure you discuss any questions you have with your health care provider. ° °
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
## 2014-10-12 ENCOUNTER — Emergency Department
Admission: EM | Admit: 2014-10-12 | Discharge: 2014-10-12 | Disposition: A | Discharge status: Home / Self Care | Payer: 59

## 2014-10-12 NOTE — ED Notes (Signed)
Called for triage. 

## 2014-10-12 NOTE — ED Notes (Signed)
Called for pt multiple times, no response 

## 2014-11-20 DIAGNOSIS — S020XXB Fracture of vault of skull, initial encounter for open fracture: Secondary | ICD-10-CM | POA: Insufficient documentation

## 2014-11-20 DIAGNOSIS — F1910 Other psychoactive substance abuse, uncomplicated: Secondary | ICD-10-CM | POA: Insufficient documentation

## 2014-11-20 DIAGNOSIS — S069X9A Unspecified intracranial injury with loss of consciousness of unspecified duration, initial encounter: Secondary | ICD-10-CM | POA: Insufficient documentation

## 2014-11-20 DIAGNOSIS — I2692 Saddle embolus of pulmonary artery without acute cor pulmonale: Secondary | ICD-10-CM | POA: Insufficient documentation

## 2014-11-20 DIAGNOSIS — S0302XA Dislocation of jaw, left side, initial encounter: Secondary | ICD-10-CM | POA: Insufficient documentation

## 2014-11-20 DIAGNOSIS — S065XAA Traumatic subdural hemorrhage with loss of consciousness status unknown, initial encounter: Secondary | ICD-10-CM | POA: Insufficient documentation

## 2014-11-20 DIAGNOSIS — G919 Hydrocephalus, unspecified: Secondary | ICD-10-CM | POA: Insufficient documentation

## 2014-11-20 DIAGNOSIS — S065X9A Traumatic subdural hemorrhage with loss of consciousness of unspecified duration, initial encounter: Secondary | ICD-10-CM | POA: Insufficient documentation

## 2015-01-03 DIAGNOSIS — R1310 Dysphagia, unspecified: Secondary | ICD-10-CM | POA: Insufficient documentation

## 2015-02-28 DIAGNOSIS — Z7409 Other reduced mobility: Secondary | ICD-10-CM | POA: Insufficient documentation

## 2015-02-28 DIAGNOSIS — F802 Mixed receptive-expressive language disorder: Secondary | ICD-10-CM | POA: Insufficient documentation

## 2015-03-16 ENCOUNTER — Ambulatory Visit: Payer: 59 | Attending: Pediatrics | Admitting: Speech Pathology

## 2015-03-16 ENCOUNTER — Encounter: Payer: Self-pay | Admitting: Speech Pathology

## 2015-03-16 DIAGNOSIS — R460 Very low level of personal hygiene: Secondary | ICD-10-CM | POA: Diagnosis present

## 2015-03-16 DIAGNOSIS — S0990XA Unspecified injury of head, initial encounter: Secondary | ICD-10-CM | POA: Diagnosis present

## 2015-03-16 DIAGNOSIS — R41841 Cognitive communication deficit: Secondary | ICD-10-CM | POA: Diagnosis not present

## 2015-03-16 DIAGNOSIS — R413 Other amnesia: Secondary | ICD-10-CM | POA: Diagnosis present

## 2015-03-16 DIAGNOSIS — M6281 Muscle weakness (generalized): Secondary | ICD-10-CM | POA: Insufficient documentation

## 2015-03-16 DIAGNOSIS — R278 Other lack of coordination: Secondary | ICD-10-CM | POA: Insufficient documentation
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
## 2015-03-20 ENCOUNTER — Ambulatory Visit: Payer: 59 | Admitting: Speech Pathology

## 2015-03-20 ENCOUNTER — Encounter: Payer: 59 | Admitting: Occupational Therapy

## 2015-03-20 DIAGNOSIS — R41841 Cognitive communication deficit: Secondary | ICD-10-CM | POA: Diagnosis not present

## 2015-03-21 ENCOUNTER — Encounter: Payer: Self-pay | Admitting: Speech Pathology

## 2015-03-21 NOTE — Therapy (Signed)
[facility] MAIN [facility] SERVICES [address] [place], [place], [address] Phone: [phone]   Fax:  [phone]  Speech Language Pathology Treatment  Patient Details  Name: Natasha [name] MRN: [id number] Date of Birth: Aug 11, 1985 Referring Provider: Dr. Richardine Service  Encounter Date: 03/20/2015      End of Session - 03/21/15 1718    Visit Number 2   Number of Visits 25   Date for SLP Re-Evaluation 06/16/15   SLP Start Time 0910   SLP Stop Time  1000   SLP Time Calculation (min) 50 min   Activity Tolerance Patient tolerated treatment well      Past Medical History  Diagnosis Date  . Restless leg syndrome unk  . Polysubstance abuse     Past Surgical History  Procedure Laterality Date  . Cholecystectomy    . Tubal ligation      There were no vitals filed for this visit.  Visit Diagnosis: Cognitive communication deficit      Subjective Assessment - 03/21/15 1717    Subjective "I'm ready to begin all my therapies"   Currently in Pain? No/denies               ADULT SLP TREATMENT - 03/21/15 0001    General Information   Behavior/Cognition Alert;Cooperative;Pleasant mood   HPI TBI   Treatment Provided   Treatment provided Cognitive-Linquistic   Pain Assessment   Pain Assessment No/denies pain   Cognitive-Linquistic Treatment   Treatment focused on Cognition   Skilled Treatment ATTENTION: Independently completed reading comprehension exercise (moderate complexity) with 85% accuracy and no significant off topic remarks.  Completed divided attention task (trail activity) with mod SLP cues towards end of task.  REASONING: After instruction in completing Perplexors logic puzzles (level A), the patient complete 3 additional problems with min-mod cues.   Assessment / Recommendations / Plan   Plan Continue with current plan of care   Progression Toward Goals   Progression toward goals Progressing toward goals          SLP  Education - 03/21/15 1717    Education provided Yes   Education Details Results of evaluation   Person(s) Educated Patient   Methods Explanation   Comprehension Verbalized understanding            SLP Long Term Goals - 03/16/15 1613    SLP LONG TERM GOAL #1   Title Patient will demonstrate functional cognitive-communication skills for independent completion of personal responsibilities.   Time 12   Period Weeks   Status New   SLP LONG TERM GOAL #2   Title Patient will complete complex executive function skills tasks with 80% accuracy.   Time 12   Period Weeks   Status New   SLP LONG TERM GOAL #3   Title Patient will complete complex attention tasks with 80% accuracy.   Time 12   Period Weeks   Status New   SLP LONG TERM GOAL #4   Title Patient will complete memory strategy activities with 80% accuracy.   Time 12   Period Weeks   Status New          Plan - 03/21/15 1718    Clinical Impression Statement The patient is attending well to therapeutic tasks.  She demonstrates continued need for work with executive skills.   Speech Therapy Frequency 2x / week   Duration Other (comment)   Treatment/Interventions Compensatory techniques;Cognitive reorganization;Internal/external aids;Functional tasks;SLP instruction and feedback;Compensatory strategies;Patient/family education   Potential  to Achieve Goals Good   Potential Considerations Ability to learn/carryover information;Cooperation/participation level;Previous level of function;Severity of impairments;Family/community support   SLP Home Exercise Plan erplexors, Level A   Consulted and Agree with Plan of Care Patient        Problem List There are no active problems to display for this patient.  Dollene PrimroseSusan G Lavergne Hiltunen, MS/CCC- SLP  Leandrew KoyanagiAbernathy, Susie 03/21/2015, 5:20 PM  [facility] [address] [place], [place], [address] Phone: [phone]   Fax:   [phone]   Name: Natasha ShadeWhitney B [name] MRN: [id number] Date of Birth: 09/19/1985

## 2015-03-27 ENCOUNTER — Ambulatory Visit: Payer: 59 | Admitting: Occupational Therapy

## 2015-03-27 ENCOUNTER — Encounter: Payer: Self-pay | Admitting: Occupational Therapy

## 2015-03-27 ENCOUNTER — Ambulatory Visit: Payer: 59 | Admitting: Speech Pathology

## 2015-03-27 DIAGNOSIS — R278 Other lack of coordination: Secondary | ICD-10-CM

## 2015-03-27 DIAGNOSIS — R413 Other amnesia: Secondary | ICD-10-CM

## 2015-03-27 DIAGNOSIS — M6281 Muscle weakness (generalized): Secondary | ICD-10-CM

## 2015-03-27 DIAGNOSIS — R41841 Cognitive communication deficit: Secondary | ICD-10-CM | POA: Diagnosis not present

## 2015-03-27 DIAGNOSIS — S0990XA Unspecified injury of head, initial encounter: Secondary | ICD-10-CM

## 2015-03-27 DIAGNOSIS — Z741 Need for assistance with personal care: Secondary | ICD-10-CM

## 2015-03-27 DIAGNOSIS — R460 Very low level of personal hygiene: Secondary | ICD-10-CM

## 2015-03-28 ENCOUNTER — Encounter: Payer: Self-pay | Admitting: Speech Pathology

## 2015-03-28 NOTE — Therapy (Signed)
[facility] MAIN [facility] SERVICES [address] [place], [place], [address] Phone: [phone]   Fax:  [phone]  Speech Language Pathology Treatment  Patient Details  Name: Natasha [name] MRN: [id number] Date of Birth: 02-13-1986 Referring Provider: Dr. Richardine Service  Encounter Date: 03/27/2015      End of Session - 03/28/15 1430    Visit Number 3   Number of Visits 25   Date for SLP Re-Evaluation 06/16/15   SLP Start Time 1400   SLP Stop Time  1455   SLP Time Calculation (min) 55 min   Activity Tolerance Patient tolerated treatment well      Past Medical History  Diagnosis Date  . Restless leg syndrome unk  . Polysubstance abuse     Past Surgical History  Procedure Laterality Date  . Cholecystectomy    . Tubal ligation      There were no vitals filed for this visit.  Visit Diagnosis: Cognitive communication deficit      Subjective Assessment - 03/28/15 1428    Subjective "I'm ready to begin all my therapies"   Currently in Pain? No/denies               ADULT SLP TREATMENT - 03/28/15 0001    General Information   Behavior/Cognition Alert;Cooperative;Pleasant mood  Increased frustration   HPI TBI   Treatment Provided   Treatment provided Cognitive-Linquistic   Pain Assessment   Pain Assessment No/denies pain   Cognitive-Linquistic Treatment   Treatment focused on Cognition   Skilled Treatment ATTENTION: Independently completed reading comprehension exercise (moderate complexity) with 85% accuracy and no significant off topic remarks.  Completed divided attention task (trail activity) with mod SLP cues.  REASONING: The patient was not able to complete the Perplexors Level A sent home last session. MEMORY: Attempted visual imagery strategy for memory task but patient was not able to understand the task.  VISUAL PERCEPTUAL SKILL: Patient was not able to detect visual information to determine occupation of pictured  person (perceive stethoscope and state the picture was a doctor).   Assessment / Recommendations / Plan   Plan Continue with current plan of care   Progression Toward Goals   Progression toward goals Progressing toward goals          SLP Education - 03/28/15 1428    Education provided Yes   Education Details Need to stay on task even when the task is perceived as difficult   Person(s) Educated Patient   Methods Explanation   Comprehension Verbalized understanding            SLP Long Term Goals - 03/16/15 1613    SLP LONG TERM GOAL #1   Title Patient will demonstrate functional cognitive-communication skills for independent completion of personal responsibilities.   Time 12   Period Weeks   Status New   SLP LONG TERM GOAL #2   Title Patient will complete complex executive function skills tasks with 80% accuracy.   Time 12   Period Weeks   Status New   SLP LONG TERM GOAL #3   Title Patient will complete complex attention tasks with 80% accuracy.   Time 12   Period Weeks   Status New   SLP LONG TERM GOAL #4   Title Patient will complete memory strategy activities with 80% accuracy.   Time 12   Period Weeks   Status New          Plan - 03/28/15 1431  Clinical Impression Statement The patient is attending well to simple therapeutic tasks, but exhibited greater frustration with more difficult tasks.  She demonstrates continued need for work with executive skills.   Speech Therapy Frequency 2x / week   Duration Other (comment)   Treatment/Interventions Compensatory techniques;Cognitive reorganization;Internal/external aids;Functional tasks;SLP instruction and feedback;Compensatory strategies;Patient/family education   Potential to Achieve Goals Good   Potential Considerations Ability to learn/carryover information;Cooperation/participation level;Previous level of function;Severity of impairments;Family/community support   SLP Home Exercise Plan Word deduction  worksheets   Consulted and Agree with Plan of Care Patient        Problem List There are no active problems to display for this patient.  Natasha PrimroseSusan G Asar [name], MS/CCC- SLP  Natasha [name], Natasha [name] 03/28/2015, 2:32 PM  [facility] [address] [place], [place], [address] Phone: [phone]   Fax:  [phone]   Name: Natasha [name] MRN: [id number] Date of Birth: 02/27/1986
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
## 2015-03-29 ENCOUNTER — Ambulatory Visit: Payer: 59 | Admitting: Speech Pathology

## 2015-03-29 ENCOUNTER — Ambulatory Visit: Payer: 59 | Attending: Pediatrics | Admitting: Occupational Therapy

## 2015-03-29 DIAGNOSIS — S0990XA Unspecified injury of head, initial encounter: Secondary | ICD-10-CM | POA: Diagnosis present

## 2015-03-29 DIAGNOSIS — R279 Unspecified lack of coordination: Secondary | ICD-10-CM | POA: Insufficient documentation

## 2015-03-29 DIAGNOSIS — R278 Other lack of coordination: Secondary | ICD-10-CM | POA: Diagnosis present

## 2015-03-29 DIAGNOSIS — M6281 Muscle weakness (generalized): Secondary | ICD-10-CM | POA: Diagnosis present

## 2015-03-29 DIAGNOSIS — R413 Other amnesia: Secondary | ICD-10-CM | POA: Diagnosis present

## 2015-03-29 DIAGNOSIS — Z7409 Other reduced mobility: Secondary | ICD-10-CM | POA: Diagnosis present

## 2015-03-29 DIAGNOSIS — R41841 Cognitive communication deficit: Secondary | ICD-10-CM

## 2015-03-29 DIAGNOSIS — R460 Very low level of personal hygiene: Secondary | ICD-10-CM | POA: Insufficient documentation

## 2015-03-29 DIAGNOSIS — Z741 Need for assistance with personal care: Secondary | ICD-10-CM

## 2015-03-29 DIAGNOSIS — R2681 Unsteadiness on feet: Secondary | ICD-10-CM | POA: Insufficient documentation

## 2015-03-30 ENCOUNTER — Encounter: Payer: Self-pay | Admitting: Occupational Therapy

## 2015-03-30 ENCOUNTER — Encounter: Payer: Self-pay | Admitting: Speech Pathology

## 2015-03-30 NOTE — Therapy (Signed)
[facility] [address] [place], [place], [address] Phone: [phone]   Fax:  [phone]  Speech Language Pathology Treatment  Patient Details  Name: Natasha ShadeWhitney B [name] MRN: [id number] Date of Birth: 10/10/1985 Referring Provider: Dr. Richardine ServiceBehling  Encounter Date: 03/29/2015      End of Session - 03/30/15 1239    Visit Number 4   Number of Visits 25   Date for SLP Re-Evaluation 06/16/15   SLP Start Time 1420   SLP Stop Time  1458   SLP Time Calculation (min) 38 min   Activity Tolerance Patient tolerated treatment well      Past Medical History  Diagnosis Date  . Restless leg syndrome unk  . Polysubstance abuse     Past Surgical History  Procedure Laterality Date  . Cholecystectomy    . Tubal ligation      There were no vitals filed for this visit.  Visit Diagnosis: Cognitive communication deficit      Subjective Assessment - 03/30/15 1238    Subjective Patient is expressing frustration with word finding difficulties   Currently in Pain? No/denies               ADULT SLP TREATMENT - 03/30/15 0001    General Information   Behavior/Cognition Alert;Cooperative;Pleasant mood  Increased frustration   HPI TBI   Treatment Provided   Treatment provided Cognitive-Linquistic   Pain Assessment   Pain Assessment No/denies pain   Cognitive-Linquistic Treatment   Treatment focused on Cognition   Skilled Treatment VISUAL PERCEPTUAL SKILL: Name objects pictured as black and white ink drawings with 70% accuracy.  Difficulties are due to both word retrieval errors and visual perceptual errors.  Verbalize pictured problems (presented as black and white ink drawings) with 60% accuracy.  Errors due primarily to visual perceptual errors such as figure-ground difficulties.  ATTENTION:  The patient is observed to have decreased sustained attention for difficult tasks, and will appear to "give up"   Assessment /  Recommendations / Plan   Plan Continue with current plan of care   Progression Toward Goals   Progression toward goals Progressing toward goals          SLP Education - 03/30/15 1239    Education provided Yes   Education Details Strategies to improve functional communication when faced with word retrieval problem   Person(s) Educated Patient   Methods Explanation   Comprehension Verbalized understanding            SLP Long Term Goals - 03/16/15 1613    SLP LONG TERM GOAL #1   Title Patient will demonstrate functional cognitive-communication skills for independent completion of personal responsibilities.   Time 12   Period Weeks   Status New   SLP LONG TERM GOAL #2   Title Patient will complete complex executive function skills tasks with 80% accuracy.   Time 12   Period Weeks   Status New   SLP LONG TERM GOAL #3   Title Patient will complete complex attention tasks with 80% accuracy.   Time 12   Period Weeks   Status New   SLP LONG TERM GOAL #4   Title Patient will complete memory strategy activities with 80% accuracy.   Time 12   Period Weeks   Status New          Plan - 03/30/15 1240    Clinical Impression Statement The patient is attending well to simple therapeutic tasks, but exhibits greater  frustration with more difficult tasks.  She is having both word retrieval problems and visual perceptual problems.  She demonstrates continued need for work with high level language and visual perceptual skills.   Speech Therapy Frequency 2x / week   Duration Other (comment)   Treatment/Interventions Compensatory techniques;Cognitive reorganization;Internal/external aids;Functional tasks;SLP instruction and feedback;Compensatory strategies;Patient/family education   Potential to Achieve Goals Good   Potential Considerations Ability to learn/carryover information;Cooperation/participation level;Previous level of function;Severity of impairments;Family/community support    SLP Home Exercise Plan Word deduction worksheets   Consulted and Agree with Plan of Care Patient        Problem List There are no active problems to display for this patient.  Natasha Primrose, MS/CCC- SLP  Natasha [name] 03/30/2015, 12:41 PM  [place] [facility] MAIN [facility] SERVICES [address] [place], [place], [address] Phone: [phone]   Fax:  [phone]   Name: Natasha [name] MRN: [id number] Date of Birth: 14-Sep-1985
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
## 2015-04-02 ENCOUNTER — Encounter: Payer: Self-pay | Admitting: Speech Pathology

## 2015-04-02 ENCOUNTER — Ambulatory Visit: Payer: 59 | Admitting: Occupational Therapy

## 2015-04-02 ENCOUNTER — Ambulatory Visit: Payer: 59 | Admitting: Speech Pathology

## 2015-04-02 ENCOUNTER — Encounter: Payer: Self-pay | Admitting: Occupational Therapy

## 2015-04-02 DIAGNOSIS — R413 Other amnesia: Secondary | ICD-10-CM

## 2015-04-02 DIAGNOSIS — Z741 Need for assistance with personal care: Secondary | ICD-10-CM

## 2015-04-02 DIAGNOSIS — S0990XA Unspecified injury of head, initial encounter: Secondary | ICD-10-CM

## 2015-04-02 DIAGNOSIS — R278 Other lack of coordination: Secondary | ICD-10-CM

## 2015-04-02 DIAGNOSIS — R41841 Cognitive communication deficit: Secondary | ICD-10-CM

## 2015-04-02 DIAGNOSIS — M6281 Muscle weakness (generalized): Secondary | ICD-10-CM

## 2015-04-02 DIAGNOSIS — R460 Very low level of personal hygiene: Secondary | ICD-10-CM

## 2015-04-02 NOTE — Therapy (Signed)
[place] [facility] MAIN [facility] SERVICES [address] [place], [place], [address] Phone: [phone]   Fax:  [phone]  Occupational Therapy Treatment  Patient Details  Name: Natasha [name] MRN: [id number] Date of Birth: 1985-12-11 No Data Recorded  Encounter Date: 04/02/2015      OT End of Session - 04/02/15 1652    Visit Number 3   Number of Visits 24   Date for OT Re-Evaluation 06/19/15   OT Start Time 1315   OT Stop Time 1400   OT Time Calculation (min) 45 min   Activity Tolerance Patient tolerated treatment well   Behavior During Therapy [facility] for tasks assessed/performed      Past Medical History  Diagnosis Date  . Restless leg syndrome unk  . Polysubstance abuse     Past Surgical History  Procedure Laterality Date  . Cholecystectomy    . Tubal ligation      There were no vitals filed for this visit.  Visit Diagnosis:  Other lack of coordination  Muscle weakness (generalized)  Self-care deficit for bathing and hygiene  Memory dysfunction following head trauma      Subjective Assessment - 04/02/15 1625    Subjective  Patient reports she was able to see her kids this weekend, her husband brought them over for a visit.  She was happy to spend some time with them.     Patient Stated Goals Patient reports she would like to be able to play with her kids, walk, talk, cook and be able to live independently.   Currently in Pain? No/denies   Multiple Pain Sites No                      OT Treatments/Exercises (OP) - 04/02/15 1627    ADLs   ADL Comments Patient seen this date for identifying safety issues with common household problems,  problem solving with reading recipes and answering questions regarding modifications to the original recipe.     Cognitive Exercises   Other Cognitive Exercises 1 Cognitive tasks as stated above in the context of IADL tasks.    Neurological Re-education Exercises   Other Exercises  1 Patient seen for UE strengthening tasks with reciprocal arm movements with resistance of 3.0 to 3.5, forwards/backwards with therapist adjusting settings.  Grooved pegboard for right and left hand for improved coordination skills, speed and dexterity with cues.    Sensation Exercises   Stereognosis intact                OT Education - 04/02/15 1652    Education provided Yes   Education Details safety with home tasks, kitchen tasks   Person(s) Educated Patient   Methods Explanation;Demonstration;Verbal cues   Comprehension Verbal cues required;Returned demonstration;Verbalized understanding             OT Long Term Goals - 03/28/15 2043    OT LONG TERM GOAL #1   Title Patient will improve bilateral UE strength by 1 mm grade to complete IADL tasks with modified independence.     Baseline 4/5 bilateral UE at eval   Time 12   Period Weeks   Status New   OT LONG TERM GOAL #2   Title Patient will complete laundry tasks with supervision.   Time 12   Period Weeks   Status New   OT LONG TERM GOAL #3   Title Patient will pour tea from a pitcher without spillage   Time 12  Period Weeks   Status New   OT LONG TERM GOAL #4   Title Patient will demonstrate the ability to transport snack items from the kitchen to the den without spilling items.    Time 12   Period Weeks   Status New   OT LONG TERM GOAL #5   Title Patient will demonstrate the ability to choose her own clothing each day, matching 100% of the time.   Baseline mom has to perform.   Time 12   Period Weeks   Status New   Long Term Additional Goals   Additional Long Term Goals Yes               Plan - 04/02/15 1653    Clinical Impression Statement Patient was able to identify safety issues with common everyday problems and offer viable solutions for 70% of issues.  Verbal cues required for the remainder of the items to formulate alternative responses.  Patient was able to follow directions for tasks  with minimal cues this date but did forget original instructions when switching from right to left hand.     Pt will benefit from skilled therapeutic intervention in order to improve on the following deficits (Retired) Decreased cognition;Decreased knowledge of use of DME;Decreased coordination;Decreased mobility;Decreased endurance;Decreased strength;Decreased balance;Decreased safety awareness;Decreased knowledge of precautions;Impaired UE functional use   Rehab Potential Good   OT Frequency 2x / week   OT Duration 12 weeks   OT Treatment/Interventions Self-care/ADL training;Therapeutic exercise;Moist Heat;Neuromuscular education;DME and/or AE instruction;Therapeutic activities;Patient/family education;Cognitive remediation/compensation   Consulted and Agree with Plan of Care Patient;Family member/caregiver   Family Member Consulted mom        Problem List There are no active problems to display for this patient.  Kerrie Buffalomy T Abrielle Finck, OTR/L, CLT Yuan Gann 04/02/2015, 4:57 PM  [place] [facility] [address] [place], [place], [address] Phone: [phone]   Fax:  [phone]  Name: Hector ShadeWhitney B Mangrum MRN: [id number] Date of Birth: 12/21/1985

## 2015-04-02 NOTE — Therapy (Signed)
[place] [facility] MAIN [facility] SERVICES [address] [place], [place], [address] Phone: [phone]   Fax:  [phone]  Speech Language Pathology Treatment  Patient Details  Name: ROYALE LENNARTZ MRN: [id number] Date of Birth: 1985/12/14 Referring Provider: Dr. Richardine Service  Encounter Date: 04/02/2015      End of Session - 04/02/15 1504    Visit Number 5   Number of Visits 25   Date for SLP Re-Evaluation 06/16/15   SLP Start Time 1400   SLP Stop Time  1450   SLP Time Calculation (min) 50 min   Activity Tolerance Patient tolerated treatment well      Past Medical History  Diagnosis Date  . Restless leg syndrome unk  . Polysubstance abuse     Past Surgical History  Procedure Laterality Date  . Cholecystectomy    . Tubal ligation      There were no vitals filed for this visit.  Visit Diagnosis: No diagnosis found.      Subjective Assessment - 04/02/15 1501    Subjective Patient seemed more confident on difficult tasks when allowed to spend larger amount of time on easier ones beforehand               ADULT SLP TREATMENT - 04/02/15 0001    General Information   Behavior/Cognition Alert;Cooperative;Pleasant mood  Increased frustration   HPI TBI   Treatment Provided   Treatment provided Cognitive-Linquistic   Pain Assessment   Pain Assessment No/denies pain   Cognitive-Linquistic Treatment   Treatment focused on Cognition   Skilled Treatment VISUAL PERCEPTUAL SKILL: Name objects pictured as black and white ink drawings with 80% accuracy.  Difficulties are due to both word retrieval errors and visual perceptual errors.  Verbalize pictured problems (presented as black and white ink drawings) with 85% accuracy.  Errors due primarily to visual perceptual errors such as figure-ground difficulties. Read and followed directions with 100% accuracy. Made word a grams with 85% accuracy.    Assessment / Recommendations / Plan   Plan  Continue with current plan of care   Progression Toward Goals   Progression toward goals Progressing toward goals          SLP Education - 04/02/15 1503    Education provided Yes   Education Details Purpose of naming activities, etc. and intention for carryover   Person(s) Educated Patient   Methods Explanation   Comprehension Verbalized understanding            SLP Long Term Goals - 03/16/15 1613    SLP LONG TERM GOAL #1   Title Patient will demonstrate functional cognitive-communication skills for independent completion of personal responsibilities.   Time 12   Period Weeks   Status New   SLP LONG TERM GOAL #2   Title Patient will complete complex executive function skills tasks with 80% accuracy.   Time 12   Period Weeks   Status New   SLP LONG TERM GOAL #3   Title Patient will complete complex attention tasks with 80% accuracy.   Time 12   Period Weeks   Status New   SLP LONG TERM GOAL #4   Title Patient will complete memory strategy activities with 80% accuracy.   Time 12   Period Weeks   Status New          Plan - 04/02/15 1505    Clinical Impression Statement The patient is attending well to simple therapeutic tasks, but exhibits greater frustration with more  difficult tasks.  She is having both word retrieval problems and visual perceptual problems.  She demonstrates continued need for work with high level language and visual perceptual skills.   Speech Therapy Frequency 2x / week   Duration Other (comment)   Treatment/Interventions Compensatory techniques;Cognitive reorganization;Internal/external aids;Functional tasks;SLP instruction and feedback;Compensatory strategies;Patient/family education   Potential to Achieve Goals Good   Potential Considerations Ability to learn/carryover information;Cooperation/participation level;Previous level of function;Severity of impairments;Family/community support   SLP Home Exercise Plan Word deduction worksheets    Consulted and Agree with Plan of Care Patient        Problem List There are no active problems to display for this patient.   Benjiman Coreaylor Raydel Hosick 04/02/2015, 3:05 PM  [place] [facility] [address] , [place], [address] Phone: [phone]   Fax:  [phone]   Name: Hector ShadeWhitney B Hewett MRN: [id number] Date of Birth: 03/25/1986

## 2015-04-04 ENCOUNTER — Ambulatory Visit: Payer: 59 | Admitting: Speech Pathology

## 2015-04-04 ENCOUNTER — Ambulatory Visit: Payer: 59 | Admitting: Occupational Therapy

## 2015-04-04 ENCOUNTER — Encounter: Payer: Self-pay | Admitting: Speech Pathology

## 2015-04-04 DIAGNOSIS — S0990XA Unspecified injury of head, initial encounter: Secondary | ICD-10-CM

## 2015-04-04 DIAGNOSIS — M6281 Muscle weakness (generalized): Secondary | ICD-10-CM

## 2015-04-04 DIAGNOSIS — R460 Very low level of personal hygiene: Secondary | ICD-10-CM

## 2015-04-04 DIAGNOSIS — Z741 Need for assistance with personal care: Secondary | ICD-10-CM

## 2015-04-04 DIAGNOSIS — R41841 Cognitive communication deficit: Secondary | ICD-10-CM

## 2015-04-04 DIAGNOSIS — R413 Other amnesia: Secondary | ICD-10-CM

## 2015-04-04 DIAGNOSIS — R278 Other lack of coordination: Secondary | ICD-10-CM

## 2015-04-04 NOTE — Therapy (Signed)
[place] [facility] [address] [place], [place], [address] Phone: [phone]   Fax:  [phone]  Speech Language Pathology Treatment  Patient Details  Name: Natasha ShadeWhitney B [name] MRN: [id number] Date of Birth: 02/23/1986 Referring Provider: Dr. Richardine ServiceBehling  Encounter Date: 04/04/2015      End of Session - 04/04/15 1659    Visit Number 7   Number of Visits 25   Date for SLP Re-Evaluation 06/16/15   SLP Start Time 1400   SLP Stop Time  1450   SLP Time Calculation (min) 50 min   Activity Tolerance Patient tolerated treatment well      Past Medical History  Diagnosis Date  . Restless leg syndrome unk  . Polysubstance abuse     Past Surgical History  Procedure Laterality Date  . Cholecystectomy    . Tubal ligation      There were no vitals filed for this visit.  Visit Diagnosis: No diagnosis found.      Subjective Assessment - 04/04/15 1658    Subjective Patient seemed more confident on difficult tasks when allowed to spend larger amount of time on easier ones beforehand   Currently in Pain? No/denies               ADULT SLP TREATMENT - 04/04/15 0001    General Information   Behavior/Cognition Alert;Cooperative;Pleasant mood  Increased frustration   HPI TBI   Treatment Provided   Treatment provided Cognitive-Linquistic   Pain Assessment   Pain Assessment No/denies pain   Cognitive-Linquistic Treatment   Treatment focused on Cognition   Skilled Treatment VISUOSPATIAL SKILL: Circle differences between two illustrations with 90% accuracy given extra time to complete task. Required min-mod cueing to complete remaining 10%. ATTENTION: Made alternating trail with 80% accuracy; errors noted were delays and lines restarted at an incorrect place. EXECUTIVE: Read and followed directions with four critical elements with 100% accuracy. VERBAL EXPRESSION: Completed semantic feature analysis for given pictures of objects  with 90% accuracy.   Assessment / Recommendations / Plan   Plan Continue with current plan of care   Progression Toward Goals   Progression toward goals Progressing toward goals          SLP Education - 04/04/15 1659    Education provided Yes   Education Details Scanning strategies, semantic feature analysis purpose and execution   Person(s) Educated Patient   Methods Explanation;Demonstration   Comprehension Verbalized understanding;Returned demonstration            SLP Long Term Goals - 03/16/15 1613    SLP LONG TERM GOAL #1   Title Patient will demonstrate functional cognitive-communication skills for independent completion of personal responsibilities.   Time 12   Period Weeks   Status New   SLP LONG TERM GOAL #2   Title Patient will complete complex executive function skills tasks with 80% accuracy.   Time 12   Period Weeks   Status New   SLP LONG TERM GOAL #3   Title Patient will complete complex attention tasks with 80% accuracy.   Time 12   Period Weeks   Status New   SLP LONG TERM GOAL #4   Title Patient will complete memory strategy activities with 80% accuracy.   Time 12   Period Weeks   Status New          Plan - 04/04/15 1700    Clinical Impression Statement The patient is attending well to simple therapeutic tasks, but exhibits  greater frustration with more difficult tasks.  She is having both word retrieval problems and visual perceptual problems.  She demonstrates continued need for work with high level language and visual perceptual skills.   Speech Therapy Frequency 2x / week   Duration Other (comment)   Treatment/Interventions Compensatory techniques;Cognitive reorganization;Internal/external aids;Functional tasks;SLP instruction and feedback;Compensatory strategies;Patient/family education   Potential to Achieve Goals Good   Potential Considerations Ability to learn/carryover information;Cooperation/participation level;Previous level of  function;Severity of impairments;Family/community support   SLP Home Exercise Plan Word deduction worksheets   Consulted and Agree with Plan of Care Patient        Problem List There are no active problems to display for this patient.   Benjiman Core 04/04/2015, 5:11 PM  [place] [facility] MAIN [facility] SERVICES [address] [place], [place], [address] Phone: [phone]   Fax:  [phone]   Name: Natasha [name] MRN: [id number] Date of Birth: 1986/04/22
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
## 2015-04-09 ENCOUNTER — Ambulatory Visit: Payer: 59 | Admitting: Speech Pathology

## 2015-04-09 ENCOUNTER — Ambulatory Visit: Payer: 59 | Admitting: Occupational Therapy

## 2015-04-09 DIAGNOSIS — R278 Other lack of coordination: Secondary | ICD-10-CM

## 2015-04-09 DIAGNOSIS — R460 Very low level of personal hygiene: Secondary | ICD-10-CM

## 2015-04-09 DIAGNOSIS — R41841 Cognitive communication deficit: Secondary | ICD-10-CM | POA: Diagnosis not present

## 2015-04-09 DIAGNOSIS — Z741 Need for assistance with personal care: Secondary | ICD-10-CM

## 2015-04-09 DIAGNOSIS — S0990XA Unspecified injury of head, initial encounter: Principal | ICD-10-CM

## 2015-04-09 DIAGNOSIS — M6281 Muscle weakness (generalized): Secondary | ICD-10-CM

## 2015-04-09 DIAGNOSIS — R413 Other amnesia: Secondary | ICD-10-CM

## 2015-04-10 ENCOUNTER — Encounter: Payer: Self-pay | Admitting: Occupational Therapy

## 2015-04-10 ENCOUNTER — Encounter: Payer: Self-pay | Admitting: Speech Pathology

## 2015-04-10 NOTE — Therapy (Signed)
[place] [facility] [address] [place], [place], [address] Phone: [phone]   Fax:  [phone]  Speech Language Pathology Treatment  Patient Details  Name: Natasha [name] MRN: [id number] Date of Birth: 07/06/1985 Referring Provider: Dr. Richardine ServiceBehling  Encounter Date: 04/09/2015      End of Session - 04/10/15 1714    Visit Number 8   Number of Visits 25   Date for SLP Re-Evaluation 06/16/15   SLP Start Time 1404   SLP Stop Time  1457   SLP Time Calculation (min) 53 min   Activity Tolerance Patient tolerated treatment well      Past Medical History  Diagnosis Date  . Restless leg syndrome unk  . Polysubstance abuse     Past Surgical History  Procedure Laterality Date  . Cholecystectomy    . Tubal ligation      There were no vitals filed for this visit.  Visit Diagnosis: Cognitive communication deficit      Subjective Assessment - 04/10/15 1713    Subjective Patient seemed more confident on difficult tasks when allowed to spend larger amount of time on easier ones beforehand   Currently in Pain? No/denies               ADULT SLP TREATMENT - 04/10/15 0001    General Information   Behavior/Cognition Alert;Cooperative;Pleasant mood  Increased frustration   HPI TBI   Treatment Provided   Treatment provided Cognitive-Linquistic   Pain Assessment   Pain Assessment No/denies pain   Cognitive-Linquistic Treatment   Treatment focused on Cognition   Skilled Treatment VISUAL PERCEPTUAL SKILL: Name objects pictured as black and white ink drawings with 80% accuracy.  Difficulties are due to both word retrieval errors and visual perceptual errors.  Verbalize pictured problems (presented as black and white ink drawings) with 70% accuracy.  Errors due primarily to visual perceptual errors such as figure-ground difficulties.  ATTENTION:  The patient is observed to have decreased sustained attention for difficult  tasks, and will appear to "give up" READING COMPREHENSION:  Requires mod-max cues to complete complex reading activity; cues to scan accurately and completely, to make logical conclusions, recall well-known information.   Assessment / Recommendations / Plan   Plan Continue with current plan of care   Progression Toward Goals   Progression toward goals Progressing toward goals          SLP Education - 04/10/15 1713    Education provided Yes   Education Details Allow herself time to accurate   Person(s) Educated Patient   Methods Explanation;Demonstration   Comprehension Verbalized understanding            SLP Long Term Goals - 03/16/15 1613    SLP LONG TERM GOAL #1   Title Patient will demonstrate functional cognitive-communication skills for independent completion of personal responsibilities.   Time 12   Period Weeks   Status New   SLP LONG TERM GOAL #2   Title Patient will complete complex executive function skills tasks with 80% accuracy.   Time 12   Period Weeks   Status New   SLP LONG TERM GOAL #3   Title Patient will complete complex attention tasks with 80% accuracy.   Time 12   Period Weeks   Status New   SLP LONG TERM GOAL #4   Title Patient will complete memory strategy activities with 80% accuracy.   Time 12   Period Weeks   Status New  Plan - 04/10/15 1715    Clinical Impression Statement The patient is attending well to simple therapeutic tasks, but exhibits greater frustration with more difficult tasks.  She is having word retrieval problems, abstract thinking problems, and visual perceptual problems.  She demonstrates improved skills in slightly more complex activities but has continued need for work with high level language, abstract thinking, and visual perceptual skills.   Speech Therapy Frequency 2x / week   Duration Other (comment)   Treatment/Interventions Compensatory techniques;Cognitive reorganization;Internal/external  aids;Functional tasks;SLP instruction and feedback;Compensatory strategies;Patient/family education   Potential to Achieve Goals Good   Potential Considerations Ability to learn/carryover information;Cooperation/participation level;Previous level of function;Severity of impairments;Family/community support   SLP Home Exercise Plan Reading comprehension   Consulted and Agree with Plan of Care Patient        Problem List There are no active problems to display for this patient.  Natasha Primrose, MS/CCC- SLP  Leandrew Koyanagi 04/10/2015, 5:16 PM   [facility] MAIN [facility] SERVICES [address] [place], [place], [address] Phone: [phone]   Fax:  [phone]   Name: Natasha [name] MRN: [id number] Date of Birth: 1985-06-03

## 2015-04-10 NOTE — Therapy (Signed)
[facility] MAIN [facility] SERVICES [address] [place], [place], [address] Phone: [phone]   Fax:  [phone]  Occupational Therapy Treatment  Patient Details  Name: Natasha [name] MRN: [id number] Date of Birth: 12-04-1985 No Data Recorded  Encounter Date: 04/09/2015      OT End of Session - 04/09/15 1952    Visit Number 5   Number of Visits 24   Date for OT Re-Evaluation 06/19/15   OT Start Time 1312   OT Stop Time 1400   OT Time Calculation (min) 48 min   Activity Tolerance Patient tolerated treatment well   Behavior During Therapy [facility] for tasks assessed/performed      Past Medical History  Diagnosis Date  . Restless leg syndrome unk  . Polysubstance abuse     Past Surgical History  Procedure Laterality Date  . Cholecystectomy    . Tubal ligation      There were no vitals filed for this visit.  Visit Diagnosis:  Memory dysfunction following head trauma  Other lack of coordination  Muscle weakness (generalized)  Self-care deficit for bathing and hygiene      Subjective Assessment - 04/09/15 1950    Subjective  Patient reports she was able to go see her kids everyday over the weekend.  Wants to be able to spend christmas eve with them and spend the night but her husband has not agreed to it yet.     Patient is accompained by: Family member   Patient Stated Goals Patient reports she would like to be able to play with her kids, walk, talk, cook and be able to live independently.   Currently in Pain? No/denies   Multiple Pain Sites No                      OT Treatments/Exercises (OP) - 04/09/15 1959    ADLs   Cooking Patient seen for brainstorming of ideas for possible kitchen session, offered 2 choices and patient able to choose one task and plan for next session with ingredients and items she may use.     Fine Motor Coordination   Other Fine Motor Exercises Patient seen for manipulation of 1/2 inch  objects from table top, cues for translatory movements of the hand as well as using the hand for storage.     Visual/Perceptual Exercises   Other Exercises Patient seen for assessment of MVPT testing, 32/36 correct   Sensation Exercises   Stereognosis intact                OT Education - 04/09/15 1951    Education provided Yes   Education Details HEP, meal planning   Person(s) Educated Patient   Methods Explanation;Demonstration;Verbal cues   Comprehension Verbal cues required;Returned demonstration;Verbalized understanding             OT Long Term Goals - 03/28/15 2043    OT LONG TERM GOAL #1   Title Patient will improve bilateral UE strength by 1 mm grade to complete IADL tasks with modified independence.     Baseline 4/5 bilateral UE at eval   Time 12   Period Weeks   Status New   OT LONG TERM GOAL #2   Title Patient will complete laundry tasks with supervision.   Time 12   Period Weeks   Status New   OT LONG TERM GOAL #3   Title Patient will pour tea from a pitcher without spillage  Time 12   Period Weeks   Status New   OT LONG TERM GOAL #4   Title Patient will demonstrate the ability to transport snack items from the kitchen to the den without spilling items.    Time 12   Period Weeks   Status New   OT LONG TERM GOAL #5   Title Patient will demonstrate the ability to choose her own clothing each day, matching 100% of the time.   Baseline mom has to perform.   Time 12   Period Weeks   Status New   Long Term Additional Goals   Additional Long Term Goals Yes               Plan - 04/09/15 1952    Clinical Impression Statement Patient was able to demonstrate problem solving with meal planning, making choices between 2 items and planning for future cooking session.  She would like to work on making cupcakes for her kids.  Speech therapist mentioned last week some possible issues with visual perception.  Administered the motor free visual  perception test.  Patient scoring 32/36, 2 items missed for visual memory and 2 items missed for visual closure, no significant deficits noted with this test.  Patient will plan to come early next scheduled time and will participate in kitchen cooking assessment.     Pt will benefit from skilled therapeutic intervention in order to improve on the following deficits (Retired) Decreased cognition;Decreased knowledge of use of DME;Decreased coordination;Decreased mobility;Decreased endurance;Decreased strength;Decreased balance;Decreased safety awareness;Decreased knowledge of precautions;Impaired UE functional use   Rehab Potential Good   OT Frequency 2x / week   OT Duration 12 weeks   OT Treatment/Interventions Self-care/ADL training;Therapeutic exercise;Moist Heat;Neuromuscular education;DME and/or AE instruction;Therapeutic activities;Patient/family education;Cognitive remediation/compensation   Consulted and Agree with Plan of Care Patient;Family member/caregiver   Family Member Consulted mom        Problem List There are no active problems to display for this patient.  Kerrie Buffalomy T Antoine Fiallos, OTR/L, CLT  Haruki Arnold 04/10/2015, 8:03 PM  [place] [facility] MAIN [facility] [address] [place], [place], [address] Phone: [phone]   Fax:  [phone]  Name: Natasha [name] MRN: [id number] Date of Birth: 10/27/1985

## 2015-04-12 ENCOUNTER — Ambulatory Visit: Payer: 59 | Admitting: Physical Therapy

## 2015-04-12 ENCOUNTER — Ambulatory Visit: Payer: 59 | Admitting: Occupational Therapy

## 2015-04-12 ENCOUNTER — Encounter: Payer: Self-pay | Admitting: Physical Therapy

## 2015-04-12 ENCOUNTER — Ambulatory Visit: Payer: 59 | Admitting: Speech Pathology

## 2015-04-12 DIAGNOSIS — Z741 Need for assistance with personal care: Secondary | ICD-10-CM

## 2015-04-12 DIAGNOSIS — R2681 Unsteadiness on feet: Secondary | ICD-10-CM

## 2015-04-12 DIAGNOSIS — M6281 Muscle weakness (generalized): Secondary | ICD-10-CM

## 2015-04-12 DIAGNOSIS — Z7409 Other reduced mobility: Secondary | ICD-10-CM

## 2015-04-12 DIAGNOSIS — R460 Very low level of personal hygiene: Secondary | ICD-10-CM

## 2015-04-12 DIAGNOSIS — S0990XA Unspecified injury of head, initial encounter: Secondary | ICD-10-CM

## 2015-04-12 DIAGNOSIS — R41841 Cognitive communication deficit: Secondary | ICD-10-CM | POA: Diagnosis not present

## 2015-04-12 DIAGNOSIS — R278 Other lack of coordination: Secondary | ICD-10-CM

## 2015-04-12 DIAGNOSIS — R279 Unspecified lack of coordination: Secondary | ICD-10-CM

## 2015-04-12 DIAGNOSIS — R413 Other amnesia: Secondary | ICD-10-CM

## 2015-04-12 NOTE — Addendum Note (Signed)
Addended by: [name], Larnell Granlund S on: 04/12/2015 03:21 PM   Modules accepted: Orders
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
## 2015-04-13 ENCOUNTER — Encounter: Payer: Self-pay | Admitting: Speech Pathology

## 2015-04-13 NOTE — Therapy (Signed)
[facility] MAIN [facility] SERVICES [address] [place], [place], [address] Phone: [phone]   Fax:  [phone]  Occupational Therapy Treatment  Patient Details  Name: Natasha [name] MRN: [id number] Date of Birth: 1985-05-17 No Data Recorded  Encounter Date: 04/12/2015      OT End of Session - 04/13/15 1003    Visit Number 6   Number of Visits 24   Date for OT Re-Evaluation 06/19/15   OT Start Time 1300   OT Stop Time 1400   OT Time Calculation (min) 60 min   Activity Tolerance Patient tolerated treatment well   Behavior During Therapy [facility] for tasks assessed/performed      Past Medical History  Diagnosis Date  . Restless leg syndrome unk  . Polysubstance abuse     Past Surgical History  Procedure Laterality Date  . Cholecystectomy    . Tubal ligation      There were no vitals filed for this visit.  Visit Diagnosis:  Other lack of coordination  Muscle weakness (generalized)  Self-care deficit for bathing and hygiene  Memory dysfunction following head trauma      Subjective Assessment - 04/12/15 1300    Subjective  Patient reports she is ready to cook, told her kids she would be making them cupcakes today.     Currently in Pain? No/denies   Multiple Pain Sites No                      OT Treatments/Exercises (OP) - 04/12/15 1400    ADLs   Cooking Patient seen for kitchen task assessment/cooking session this date. Patient planned session to bake cupcakes from a box mix and apply icing and sprinkles.  She was able to locate all supply items in the kitchen and request other items such as muffin pan, measuring cup, etc.  She required minimal cues for redirecting to 1st step of preheating the oven.  She was able to measure out ingredients as directed on the package with accuracy.  She mixed items using blender and was able to fill muffin cups.   Supervision and cues to place pan into hot oven.  Timer utilized  for baking time.  Patient able to remove item from oven with supervision and cues.  Patient cleaned items and placed into dishwasher.  She did not have enough time to ice the cupcakes but was able to take them home for completion.     Fine Motor Coordination   Other Fine Motor Exercises --   Sensation Exercises   Stereognosis intact                OT Education - 04/12/15 1400    Education provided Yes   Education Details safety in the kitchen, positioning to use the oven.  Recommend supervision in the kitchen with use of stove or oven.   Person(s) Educated Patient   Methods Explanation;Demonstration;Verbal cues   Comprehension Verbal cues required;Returned demonstration;Verbalized understanding             OT Long Term Goals - 03/28/15 2043    OT LONG TERM GOAL #1   Title Patient will improve bilateral UE strength by 1 mm grade to complete IADL tasks with modified independence.     Baseline 4/5 bilateral UE at eval   Time 12   Period Weeks   Status New   OT LONG TERM GOAL #2   Title Patient will complete laundry tasks with supervision.  Time 12   Period Weeks   Status New   OT LONG TERM GOAL #3   Title Patient will pour tea from a pitcher without spillage   Time 12   Period Weeks   Status New   OT LONG TERM GOAL #4   Title Patient will demonstrate the ability to transport snack items from the kitchen to the den without spilling items.    Time 12   Period Weeks   Status New   OT LONG TERM GOAL #5   Title Patient will demonstrate the ability to choose her own clothing each day, matching 100% of the time.   Baseline mom has to perform.   Time 12   Period Weeks   Status New   Long Term Additional Goals   Additional Long Term Goals Yes               Plan - 04/13/15 1003    Clinical Impression Statement See above regarding patient's performance with cooking tasks.  Recommend supervision at home with any oven or stove use primarily due to decreased  memory and safety.  Also recommend the use of a timer for a cue for completion of cooking times.  Patient demonstrates understanding of this.  Able to follow directions well from the box and demonstrated simple problem solving.  Will attempt to advance to more complex kitchen tasks in future sessions.    Pt will benefit from skilled therapeutic intervention in order to improve on the following deficits (Retired) Decreased cognition;Decreased knowledge of use of DME;Decreased coordination;Decreased mobility;Decreased endurance;Decreased strength;Decreased balance;Decreased safety awareness;Decreased knowledge of precautions;Impaired UE functional use   Rehab Potential Good   OT Frequency 2x / week   OT Duration 12 weeks   OT Treatment/Interventions Self-care/ADL training;Therapeutic exercise;Moist Heat;Neuromuscular education;DME and/or AE instruction;Therapeutic activities;Patient/family education;Cognitive remediation/compensation   Consulted and Agree with Plan of Care Patient        Problem List There are no active problems to display for this patient. Kerrie Buffalomy T Naveena Eyman, OTR/L, CLT   Alaze Garverick 04/13/2015, 11:43 AM  [place] [facility] [address] [place], [place], [address] Phone: [phone]   Fax:  [phone]  Name: Natasha [name] MRN: [id number] Date of Birth: 10/04/1985

## 2015-04-13 NOTE — Therapy (Signed)
[place] [facility] MA[facility] [address] [place], [place], [address] Phone: [phone]   Fax:  [phone]  Speech Language Pathology Treatment  Patient Details  Name: Natasha ShadeWhitney B Mundie MRN: [id number] Date of Birth: 12/26/1985 Referring Provider: Dr. Richardine ServiceBehling  Encounter Date: 04/12/2015      End of Session - 04/13/15 1416    Visit Number 9   Number of Visits 25   Date for SLP Re-Evaluation 06/16/15   SLP Start Time 1500   SLP Stop Time  1557   SLP Time Calculation (min) 57 min   Activity Tolerance Patient tolerated treatment well      Past Medical History  Diagnosis Date  . Restless leg syndrome unk  . Polysubstance abuse     Past Surgical History  Procedure Laterality Date  . Cholecystectomy    . Tubal ligation      There were no vitals filed for this visit.  Visit Diagnosis: Cognitive communication deficit      Subjective Assessment - 04/13/15 1415    Subjective The patient feels that she is working harder and better.   Currently in Pain? No/denies               ADULT SLP TREATMENT - 04/13/15 0001    General Information   Behavior/Cognition Alert;Cooperative;Pleasant mood  Increased frustration   HPI TBI   Treatment Provided   Treatment provided Cognitive-Linquistic   Pain Assessment   Pain Assessment No/denies pain   Cognitive-Linquistic Treatment   Treatment focused on Cognition   Skilled Treatment VISUAL PERCEPTUAL SKILL: Verbalize pictured problems (presented as black and white ink drawings) with 80% accuracy.  Demonstrates fewer errors due to visual perceptual errors such as figure-ground difficulties.  ATTENTION:  The patient demonstrated improved sustained attention for difficult tasks, with less tendency to "give up" READING COMPREHENSION:  Independently completed lengthy reading passage and answered factual questions with 90% accuracy.  REASONING: The patient required fewer cues to complete the  Perplexors Level A, demonstrating improved recall of strategies to aid solving the puzzle.   Assessment / Recommendations / Plan   Plan Continue with current plan of care   Progression Toward Goals   Progression toward goals Progressing toward goals          SLP Education - 04/13/15 1416    Education provided Yes   Education Details Talk through reasoning tasks   Person(s) Educated Patient   Methods Explanation   Comprehension Verbalized understanding            SLP Long Term Goals - 03/16/15 1613    SLP LONG TERM GOAL #1   Title Patient will demonstrate functional cognitive-communication skills for independent completion of personal responsibilities.   Time 12   Period Weeks   Status New   SLP LONG TERM GOAL #2   Title Patient will complete complex executive function skills tasks with 80% accuracy.   Time 12   Period Weeks   Status New   SLP LONG TERM GOAL #3   Title Patient will complete complex attention tasks with 80% accuracy.   Time 12   Period Weeks   Status New   SLP LONG TERM GOAL #4   Title Patient will complete memory strategy activities with 80% accuracy.   Time 12   Period Weeks   Status New          Plan - 04/13/15 1417    Clinical Impression Statement The patient is attending well to simple  therapeutic tasks, but exhibits greater frustration with more difficult tasks.  She is having word retrieval problems, abstract thinking problems, and visual perceptual problems.  She demonstrates improved skills in slightly more complex activities but has continued need for work with high level language, abstract thinking, and visual perceptual skills.   Speech Therapy Frequency 2x / week   Duration Other (comment)   Treatment/Interventions Compensatory techniques;Cognitive reorganization;Internal/external aids;Functional tasks;SLP instruction and feedback;Compensatory strategies;Patient/family education   Potential to Achieve Goals Good   Potential  Considerations Ability to learn/carryover information;Cooperation/participation level;Previous level of function;Severity of impairments;Family/community support   SLP Home Exercise Plan Reading comprehension   Consulted and Agree with Plan of Care Patient        Problem List There are no active problems to display for this patient.  Dollene Primrose, MS/CCC- SLP  Leandrew Koyanagi 04/13/2015, 2:18 PM  [facility] MAIN [facility] SERVICES [address] [place], [place], [address] Phone: [phone]   Fax:  [phone]   Name: ALISEA MATTE MRN: [id number] Date of Birth: [birth year]

## 2015-04-16 ENCOUNTER — Ambulatory Visit: Payer: 59 | Admitting: Physical Therapy

## 2015-04-16 ENCOUNTER — Ambulatory Visit: Payer: 59 | Admitting: Occupational Therapy

## 2015-04-16 ENCOUNTER — Ambulatory Visit: Payer: 59 | Admitting: Speech Pathology

## 2015-04-16 DIAGNOSIS — Z741 Need for assistance with personal care: Secondary | ICD-10-CM

## 2015-04-16 DIAGNOSIS — R460 Very low level of personal hygiene: Secondary | ICD-10-CM

## 2015-04-16 DIAGNOSIS — R2681 Unsteadiness on feet: Secondary | ICD-10-CM

## 2015-04-16 DIAGNOSIS — R41841 Cognitive communication deficit: Secondary | ICD-10-CM

## 2015-04-16 DIAGNOSIS — M6281 Muscle weakness (generalized): Secondary | ICD-10-CM

## 2015-04-16 DIAGNOSIS — R278 Other lack of coordination: Secondary | ICD-10-CM

## 2015-04-16 DIAGNOSIS — R279 Unspecified lack of coordination: Secondary | ICD-10-CM

## 2015-04-16 DIAGNOSIS — Z7409 Other reduced mobility: Secondary | ICD-10-CM

## 2015-04-16 DIAGNOSIS — R413 Other amnesia: Secondary | ICD-10-CM

## 2015-04-16 DIAGNOSIS — S0990XA Unspecified injury of head, initial encounter: Secondary | ICD-10-CM

## 2015-04-16 NOTE — Therapy (Addendum)
[facility] [address] [place], [place], [address] Phone: [phone]   Fax:  [phone]  Physical Therapy Treatment  Patient Details  Name: Natasha ShadeWhitney B [name] MRN: [id number] Date of Birth: 08/06/1985 Referring Provider: Doristine MangoWhite elizabeth  Encounter Date: 04/16/2015      PT End of Session - 04/16/15 1501    Visit Number 2   Number of Visits 25   PT Start Time 0300   PT Stop Time 0345   PT Time Calculation (min) 45 min   Equipment Utilized During Treatment Gait belt   Activity Tolerance Patient tolerated treatment well   Behavior [facility] Therapy [facility] for tasks assessed/performed      Past Medical History  Diagnosis Date  . Restless leg syndrome unk  . Polysubstance abuse     Past Surgical History  Procedure Laterality Date  . Cholecystectomy    . Tubal ligation      There were no vitals filed for this visit.  Visit Diagnosis:  Unsteady gait  Impaired functional mobility, balance, gait, and endurance  Lack of coordination  Other lack of coordination  Muscle weakness (generalized)      Subjective Assessment - 04/16/15 1455    Subjective  Patient is doing well today      Therapeutic exercise;  standing hip abd with YTB, 4 way hip  YTB x 20  side stepping left and right in parallel bars 10 feet x 3 step ups from floor to 6 inch stool x 20 bilateral Sit to stand x 10 x 2 from chair Eccentric step downs x 10 x 2 BLE Leg press x 20 x 3 130 lbs, heel raises with 90 lbs x 20 x 3 Heel raises standing    Neuromuscular training: standing on blue foam with cone reaching x 20 across midline  Blue foam tapping to stool, 1/2 foam standing and catching a ball ,taping a balloon.  Blue balance beam side stepping left and right x 10 in parallel bars  Patient continues to demonstrates less incoordination of movement with select exercises  Patient responds well to verbal and tactile cues to correct form and  technique.  Muscle fatigue but no major pain complaints.                      PT Education - 04/16/15 1456    Education provided Yes   Education Details HEP   Person(s) Educated Patient   Methods Explanation   Comprehension Verbalized understanding             PT Long Term Goals - 04/12/15 1438    PT LONG TERM GOAL #1   Title Patient will reduce timed up and go to <11 seconds to reduce fall risk and demonstrate improved transfer/gait ability.   Time 12   Period Weeks   PT LONG TERM GOAL #2   Title Patient will increase six minute walk test distance to >1000 for progression to community ambulator and improve gait ability   Time 12   Period Weeks   PT LONG TERM GOAL #3   Title Patient will increase 10 meter walk test to >1.16101m/s as to improve gait speed for better community ambulation and to reduce fall    Time 12   Period Weeks   PT LONG TERM GOAL #4   Title Patient will tolerate 5 seconds of single leg stance without loss of balance to improve ability to get in and out of shower  safely   Time 12   Period Weeks   PT LONG TERM GOAL #5   Title Patient (< 25 years old) will complete five times sit to stand test in < 10 seconds indicating an increased LE strength and improved balance   Time 12   Period Weeks               Plan - 04/16/15 1505    Clinical Impression Statement Patient has impaired standing balnce and ambulates with a rollator. She is able to preform dynamic standing balance and exercise without pain behaviors.    Pt will benefit from skilled therapeutic intervention in order to improve on the following deficits Abnormal gait;Decreased balance;Decreased endurance;Decreased mobility;Difficulty walking;Decreased cognition;Decreased safety awareness;Decreased strength;Impaired UE functional use;Obesity   Rehab Potential Good   PT Frequency 2x / week   PT Duration 12 weeks   PT Treatment/Interventions Therapeutic activities;Therapeutic  exercise;Balance training;Neuromuscular re-education;Stair training;Gait training   PT Next Visit Plan strengthening and balance training   PT Home Exercise Plan balance in corner   Consulted and Agree with Plan of Care Patient        Problem List There are no active problems to display for this patient.   [name] 04/16/2015, 3:07 PM  [facility] MAIN [facility] SERVICES [address] [place], [place], [address] Phone: [phone]   Fax:  [phone]  Name: Natasha [name] MRN: [id number] Date of Birth: [birth year]

## 2015-04-17 ENCOUNTER — Encounter: Payer: Self-pay | Admitting: Speech Pathology

## 2015-04-17 NOTE — Therapy (Signed)
[facility] [address] [place], [place], [address] Phone: [phone]   Fax:  [phone]  Speech Language Pathology Treatment  Patient Details  Name: Natasha ShadeWhitney B [name] MRN: [id number] Date of Birth: 06/18/1985 Referring Provider: Dr. Richardine ServiceBehling  Encounter Date: 04/16/2015      End of Session - 04/17/15 0941    Visit Number 10   Number of Visits 25   Date for SLP Re-Evaluation 06/16/15   SLP Start Time 1400   SLP Stop Time  1452   SLP Time Calculation (min) 52 min   Activity Tolerance Patient tolerated treatment well      Past Medical History  Diagnosis Date  . Restless leg syndrome unk  . Polysubstance abuse     Past Surgical History  Procedure Laterality Date  . Cholecystectomy    . Tubal ligation      There were no vitals filed for this visit.  Visit Diagnosis: Cognitive communication deficit      Subjective Assessment - 04/17/15 0939    Subjective The patient feels that she is working harder and better.   Currently in Pain? No/denies               ADULT SLP TREATMENT - 04/17/15 0001    General Information   Behavior/Cognition Alert;Cooperative;Pleasant mood  Increased frustration   HPI TBI   Treatment Provided   Treatment provided Cognitive-Linquistic   Pain Assessment   Pain Assessment No/denies pain   Cognitive-Linquistic Treatment   Treatment focused on Cognition   Skilled Treatment VISUAL PERCEPTUAL SKILL: Verbalize pictured problems (presented as black and white ink drawings) with 80% accuracy.  Demonstrates fewer errors due to visual perceptual errors such as figure-ground difficulties.  WORD FINDING:  complete simple verbal analogies given choice of 3 words with 80% accuracy independently.  ATTENTION:  The patient demonstrated improved sustained attention for difficult tasks, with less tendency to "give up".  READING COMPREHENSION:  Follow complex written directions with 80%  accuracy.   REASONING: The patient required fewer cues to complete the Perplexors Level A, demonstrating improved recall of strategies to aid solving the puzzle.   Assessment / Recommendations / Plan   Plan Continue with current plan of care   Progression Toward Goals   Progression toward goals Progressing toward goals          SLP Education - 04/17/15 0940    Education Details Need to tolerate struggle when faced with difficult task   Person(s) Educated Patient   Methods Explanation   Comprehension Verbalized understanding            SLP Long Term Goals - 03/16/15 1613    SLP LONG TERM GOAL #1   Title Patient will demonstrate functional cognitive-communication skills for independent completion of personal responsibilities.   Time 12   Period Weeks   Status New   SLP LONG TERM GOAL #2   Title Patient will complete complex executive function skills tasks with 80% accuracy.   Time 12   Period Weeks   Status New   SLP LONG TERM GOAL #3   Title Patient will complete complex attention tasks with 80% accuracy.   Time 12   Period Weeks   Status New   SLP LONG TERM GOAL #4   Title Patient will complete memory strategy activities with 80% accuracy.   Time 12   Period Weeks   Status New          Plan - 04/17/15  1610    Clinical Impression Statement The patient is attending well to simple therapeutic tasks, but exhibits greater frustration with more difficult tasks.  She is having word retrieval problems, abstract thinking problems, and visual perceptual problems.  She demonstrates improved skills in slightly more complex activities but has continued need for work with high level language, abstract thinking, and visual perceptual skills.   Speech Therapy Frequency 2x / week   Duration Other (comment)   Treatment/Interventions Compensatory techniques;Cognitive reorganization;Internal/external aids;Functional tasks;SLP instruction and feedback;Compensatory  strategies;Patient/family education   Potential to Achieve Goals Good   Potential Considerations Ability to learn/carryover information;Cooperation/participation level;Previous level of function;Severity of impairments;Family/community support   SLP Home Exercise Plan Analogies   Consulted and Agree with Plan of Care Patient        Problem List There are no active problems to display for this patient.  Dollene Primrose, MS/CCC- SLP  Leandrew Koyanagi 04/17/2015, 9:43 AM  [facility] MAIN [facility] SERVICES [address] [place], [place], [address] Phone: [phone]   Fax:  [phone]   Name: Natasha [name] MRN: [id number] Date of Birth: Aug 25, 1985
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
## 2015-04-18 ENCOUNTER — Ambulatory Visit: Payer: 59 | Admitting: Occupational Therapy

## 2015-04-18 ENCOUNTER — Ambulatory Visit: Payer: 59 | Admitting: Speech Pathology

## 2015-04-18 ENCOUNTER — Encounter: Payer: Self-pay | Admitting: Physical Therapy

## 2015-04-18 ENCOUNTER — Ambulatory Visit: Payer: 59 | Admitting: Physical Therapy

## 2015-04-18 DIAGNOSIS — R41841 Cognitive communication deficit: Secondary | ICD-10-CM

## 2015-04-18 DIAGNOSIS — M6281 Muscle weakness (generalized): Secondary | ICD-10-CM

## 2015-04-18 DIAGNOSIS — R2681 Unsteadiness on feet: Secondary | ICD-10-CM

## 2015-04-18 DIAGNOSIS — R460 Very low level of personal hygiene: Secondary | ICD-10-CM

## 2015-04-18 DIAGNOSIS — R279 Unspecified lack of coordination: Secondary | ICD-10-CM

## 2015-04-18 DIAGNOSIS — R278 Other lack of coordination: Secondary | ICD-10-CM

## 2015-04-18 DIAGNOSIS — Z741 Need for assistance with personal care: Secondary | ICD-10-CM

## 2015-04-18 DIAGNOSIS — Z7409 Other reduced mobility: Secondary | ICD-10-CM

## 2015-04-18 DIAGNOSIS — R413 Other amnesia: Secondary | ICD-10-CM

## 2015-04-18 DIAGNOSIS — S0990XA Unspecified injury of head, initial encounter: Secondary | ICD-10-CM

## 2015-04-18 NOTE — Therapy (Signed)
[place] [facility] [address] [place], [place], [address] Phone: [phone]   Fax:  [phone]  Physical Therapy Treatment  Patient Details  Name: Natasha ShadeWhitney B [name] MRN: [id number] Date of Birth: 01/26/1986 Referring Provider: Doristine MangoWhite elizabeth  Encounter Date: 04/18/2015      PT End of Session - 04/18/15 1525    Visit Number 3   Number of Visits 25   PT Start Time 0320   PT Stop Time 0345   PT Time Calculation (min) 25 min   Equipment Utilized During Treatment Gait belt   Activity Tolerance Patient tolerated treatment well   Behavior [facility] Therapy [facility] for tasks assessed/performed      Past Medical History  Diagnosis Date  . Restless leg syndrome unk  . Polysubstance abuse     Past Surgical History  Procedure Laterality Date  . Cholecystectomy    . Tubal ligation      There were no vitals filed for this visit.  Visit Diagnosis:  Unsteady gait  Impaired functional mobility, balance, gait, and endurance  Lack of coordination  Other lack of coordination  Muscle weakness (generalized)      Subjective Assessment - 04/18/15 1523    Subjective  Patient is doing fine today. She says that her legs are tired. She arived 20 minutes late due to needing to use the bathroom.   Currently in Pain? No/denies     Therapeutic exercise:  Leg press x 75 lbs x 20, heel raises with machine 60 lbs x 20 x 2  standing hip abd with YTB x 20  side stepping left and right in parallel bars 10 feet  YTB  x 3 Eccentric step downs left and right x 5 x 4 sets step ups from floor to 6 inch stool x 20 bilateral sit to stand x 10 Min cueing needed to appropriately perform  tasks with leg, and head position. Decreased coordination demonstrated requiring consistent verbal cueing to correct form. Cognitive understanding of task was delayed. Patient continues to demonstrate some in coordination of movement with select exercises. Patient  responds well to verbal and tactile cues to correct form and technique.  CGA to SBA for safety with activities.  Uses to increase intensity and of movements throughout session.                            PT Education - 04/18/15 1524    Education provided Yes   Education Details HEP   Person(s) Educated Patient   Methods Explanation   Comprehension Verbalized understanding             PT Long Term Goals - 04/12/15 1438    PT LONG TERM GOAL #1   Title Patient will reduce timed up and go to <11 seconds to reduce fall risk and demonstrate improved transfer/gait ability.   Time 12   Period Weeks   PT LONG TERM GOAL #2   Title Patient will increase six minute walk test distance to >1000 for progression to community ambulator and improve gait ability   Time 12   Period Weeks   PT LONG TERM GOAL #3   Title Patient will increase 10 meter walk test to >1.2628m/s as to improve gait speed for better community ambulation and to reduce fall    Time 12   Period Weeks   PT LONG TERM GOAL #4   Title Patient will tolerate 5 seconds of  single leg stance without loss of balance to improve ability to get in and out of shower safely   Time 12   Period Weeks   PT LONG TERM GOAL #5   Title Patient (< 50 years old) will complete five times sit to stand test in < 10 seconds indicating an increased LE strength and improved balance   Time 12   Period Weeks               Plan - 04/18/15 1527    Clinical Impression Statement Patient has impaired standing balance with incoordination of UE and LE movements and mild motor planning impairement. She has  impaired memory and is abl eto tolerate 25 minutes of exericses today for LE strengthening.    Pt will benefit from skilled therapeutic intervention in order to improve on the following deficits Abnormal gait;Decreased balance;Decreased endurance;Decreased mobility;Difficulty walking;Decreased cognition;Decreased safety  awareness;Decreased strength;Impaired UE functional use;Obesity   Rehab Potential Good   PT Frequency 2x / week   PT Duration 12 weeks   PT Treatment/Interventions Therapeutic activities;Therapeutic exercise;Balance training;Neuromuscular re-education;Stair training;Gait training   PT Next Visit Plan strengthening and balance training   PT Home Exercise Plan balance in corner   Consulted and Agree with Plan of Care Patient        Problem List There are no active problems to display for this patient.   [name] 04/18/2015, 3:31 PM  [facility] MAIN [facility] SERVICES [address] [place], [place], [address] Phone: [phone]   Fax:  [phone]  Name: Natasha [name] MRN: [id number] Date of Birth: 06-04-1985

## 2015-04-19 ENCOUNTER — Encounter: Payer: Self-pay | Admitting: Speech Pathology

## 2015-04-19 NOTE — Therapy (Signed)
[place] MAIN [facility] SERVICES [address] [place], [place], [address] Phone: [phone]   Fax:  [phone]  Speech Language Pathology Treatment/Progress Note  Patient Details  Name: Natasha [name] MRN: [id number] Date of Birth: 05/22/1985 Referring Provider: Dr. Janene Harvey  Encounter Date: 04/18/2015      End of Session - 04/19/15 0949    Visit Number 11   Number of Visits 25   Date for SLP Re-Evaluation 06/16/15   SLP Start Time 1400   SLP Stop Time  1455   SLP Time Calculation (min) 55 min   Activity Tolerance Patient tolerated treatment well      Past Medical History  Diagnosis Date  . Restless leg syndrome unk  . Polysubstance abuse     Past Surgical History  Procedure Laterality Date  . Cholecystectomy    . Tubal ligation      There were no vitals filed for this visit.  Visit Diagnosis: Cognitive communication deficit      Subjective Assessment - 04/19/15 0949    Subjective The patient feels that she is working harder and better.   Currently in Pain? No/denies               ADULT SLP TREATMENT - 04/19/15 0001    General Information   Behavior/Cognition Alert;Cooperative;Pleasant mood  Increased frustration   HPI TBI   Treatment Provided   Treatment provided Cognitive-Linquistic   Pain Assessment   Pain Assessment No/denies pain   Cognitive-Linquistic Treatment   Treatment focused on Cognition   Skilled Treatment WORD FINDING:  complete simple verbal analogies with 80% accuracy independently.  Deduce word given 3 clue words with 80% accuracy.  ATTENTION:  The patient demonstrated improved sustained attention for difficult tasks, with less tendency to "give up".  READING COMPREHENSION:  Follow complex written directions with 80% accuracy.   Complete simple math word problems with 90% accuracy.  Read mystery story (Grade 3-5) and answer factual questions with 60% accuracy; solve mystery given mod cues.  REASONING: The patient required fewer cues to complete the Perplexors Level A, demonstrating improved recall of strategies to aid solving the puzzle.   Assessment / Recommendations / Plan   Plan Continue with current plan of care   Progression Toward Goals   Progression toward goals Progressing toward goals          SLP Education - 04/19/15 0949    Education provided Yes   Education Details Need to tolerate struggle when faced with difficult task   Person(s) Educated Patient   Methods Explanation   Comprehension Verbalized understanding            SLP Long Term Goals - 04/19/15 0950    SLP LONG TERM GOAL #1   Title Patient will demonstrate functional cognitive-communication skills for independent completion of personal responsibilities.   Time 12   Period Weeks   Status Partially Met   SLP LONG TERM GOAL #2   Title Patient will complete complex executive function skills tasks with 80% accuracy.   Time 12   Period Weeks   Status Partially Met   SLP LONG TERM GOAL #3   Title Patient will complete complex attention tasks with 80% accuracy.   Time 12   Period Weeks   Status Partially Met   SLP LONG TERM GOAL #4   Title Patient will complete memory strategy activities with 80% accuracy.   Time 12   Period Weeks   Status Partially Met  Problem List There are no active problems to display for this patient.  Leroy Sea, MS/CCC- SLP  Lou Miner 04/19/2015, 9:51 AM  [place] MAIN [facility] SERVICES [address] [place], [place], [address] Phone: [phone]   Fax:  [phone]   Name: NOELIE RENFROW MRN: [id number] Date of Birth: 28-Mar-1986
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
## 2015-04-24 ENCOUNTER — Encounter: Payer: 59 | Admitting: Speech Pathology

## 2015-04-24 ENCOUNTER — Encounter: Payer: 59 | Admitting: Occupational Therapy

## 2015-04-24 ENCOUNTER — Ambulatory Visit: Payer: 59 | Admitting: Physical Therapy

## 2015-04-26 ENCOUNTER — Encounter: Payer: Self-pay | Admitting: Occupational Therapy

## 2015-04-26 ENCOUNTER — Ambulatory Visit: Payer: 59

## 2015-04-26 ENCOUNTER — Ambulatory Visit: Payer: 59 | Admitting: Speech Pathology

## 2015-04-26 ENCOUNTER — Encounter: Payer: 59 | Admitting: Occupational Therapy

## 2015-04-26 ENCOUNTER — Ambulatory Visit: Payer: 59 | Admitting: Occupational Therapy

## 2015-04-26 VITALS — BP 123/84 | HR 86

## 2015-04-26 DIAGNOSIS — M6281 Muscle weakness (generalized): Secondary | ICD-10-CM

## 2015-04-26 DIAGNOSIS — R41841 Cognitive communication deficit: Secondary | ICD-10-CM

## 2015-04-26 DIAGNOSIS — R279 Unspecified lack of coordination: Secondary | ICD-10-CM

## 2015-04-26 DIAGNOSIS — R2681 Unsteadiness on feet: Secondary | ICD-10-CM

## 2015-04-26 DIAGNOSIS — Z7409 Other reduced mobility: Secondary | ICD-10-CM

## 2015-04-26 DIAGNOSIS — R460 Very low level of personal hygiene: Secondary | ICD-10-CM

## 2015-04-26 DIAGNOSIS — Z741 Need for assistance with personal care: Secondary | ICD-10-CM

## 2015-04-26 NOTE — Patient Instructions (Signed)
Instructed in techniques for fine motor activities. 

## 2015-04-26 NOTE — Therapy (Signed)
[place] [address] [place], [place], [address] Phone: [phone]   Fax:  [phone]  Physical Therapy Treatment  Patient Details  Name: Hector ShadeWhitney B Newstrom MRN: [id number] Date of Birth: 04/23/1986 Referring Provider: Doristine MangoWhite elizabeth  Encounter Date: 04/26/2015      PT End of Session - 04/26/15 1550    Visit Number 4   Number of Visits 25   Date for PT Re-Evaluation 07/05/15   PT Start Time 1500   PT Stop Time 1545   PT Time Calculation (min) 45 min   Equipment Utilized During Treatment Gait belt   Activity Tolerance Patient tolerated treatment well   Behavior During Therapy [facility] for tasks assessed/performed      Past Medical History  Diagnosis Date  . Restless leg syndrome unk  . Polysubstance abuse     Past Surgical History  Procedure Laterality Date  . Cholecystectomy    . Tubal ligation      Filed Vitals:   04/26/15 1506  BP: 123/84  Pulse: 86  SpO2: 99%    Visit Diagnosis:  Unsteady gait  Impaired functional mobility, balance, gait, and endurance  Muscle weakness (generalized)      Subjective Assessment - 04/26/15 1504    Subjective Pt states she is doing well today. No specific questions or concerns. Pt reports she has not been performing exercises at home.    Currently in Pain? No/denies         THER-EX Warm-up on NuStep L2 x 4 minutes during history (3 minutes unbilled); Quantum leg press 90# x 10, 105# x 10, 120# x 10;  Heel raises in // bars 2 x 10; Squats in // bars 2 x 10; Side stepping left and right in parallel bars RTB 4 laps x 2; Step-ups to 6" step without UE support alternating  X 10 each; Sit to stand from regular height chair without UE support x 10;  NEUROMUSCULAR RE-EDUCATION Airex balance WBOS eyes open/closed x 30 seconds; Airex balance WBOS eyes open with horizontal and vertical head turns x 30 seconds each; Airex balance NBOS eyes open x 30  seconds Airex balance NBOS with horizontal and vertical head turns x 30 seconds each Min cueing needed to appropriately perform tasks with leg, and head position. Decreased coordination demonstrated requiring consistent verbal cueing to correct form. Patient responds well to verbal and tactile cues to correct form and technique. Intermittent min to modA+1 during step-ups for safety.                       PT Education - 04/26/15 1549    Education provided Yes   Education Details HEP reinforced   Person(s) Educated Patient   Methods Explanation   Comprehension Verbalized understanding             PT Long Term Goals - 04/12/15 1438    PT LONG TERM GOAL #1   Title Patient will reduce timed up and go to <11 seconds to reduce fall risk and demonstrate improved transfer/gait ability.   Time 12   Period Weeks   PT LONG TERM GOAL #2   Title Patient will increase six minute walk test distance to >1000 for progression to community ambulator and improve gait ability   Time 12   Period Weeks   PT LONG TERM GOAL #3   Title Patient will increase 10 meter walk test to >1.5262m/s as to improve gait speed for better community  ambulation and to reduce fall    Time 12   Period Weeks   PT LONG TERM GOAL #4   Title Patient will tolerate 5 seconds of single leg stance without loss of balance to improve ability to get in and out of shower safely   Time 12   Period Weeks   PT LONG TERM GOAL #5   Title Patient (< 2 years old) will complete five times sit to stand test in < 10 seconds indicating an increased LE strength and improved balance   Time 12   Period Weeks               Plan - 04/26/15 1550    Clinical Impression Statement Pt with incoordination during stepping resulting in need for min to modA+1 support for balance. She is particularly unstable on Airex pad with balance activities. Pt able to increase resistance on leg press. Continue HEP and follow-up as  scheduled.    Pt will benefit from skilled therapeutic intervention in order to improve on the following deficits Abnormal gait;Decreased balance;Decreased endurance;Decreased mobility;Difficulty walking;Decreased cognition;Decreased safety awareness;Decreased strength;Impaired UE functional use;Obesity   Rehab Potential Good   PT Frequency 2x / week   PT Duration 12 weeks   PT Treatment/Interventions Therapeutic activities;Therapeutic exercise;Balance training;Neuromuscular re-education;Stair training;Gait training   PT Next Visit Plan strengthening and balance training   PT Home Exercise Plan balance in corner   Consulted and Agree with Plan of Care Patient        Problem List There are no active problems to display for this patient.  Lynnea Maizes PT, DPT   Luverne Zerkle 04/26/2015, 3:51 PM  [place] [facility] MAIN [facility] SERVICES [address] [place], [place], [address] Phone: [phone]   Fax:  [phone]  Name: STEPHINIE BATTISTI MRN: [id number] Date of Birth: Aug 20, 1985
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
## 2015-04-27 ENCOUNTER — Encounter: Payer: Self-pay | Admitting: Speech Pathology

## 2015-04-27 NOTE — Therapy (Signed)
[place] MAIN [facility] SERVICES [address] [place], [place], [address] Phone: [phone]   Fax:  [phone]  Speech Language Pathology Treatment  Patient Details  Name: SHULAMIS WENBERG MRN: [id number] Date of Birth: 10/26/1985 Referring Provider: Dr. Janene Harvey  Encounter Date: 04/26/2015      End of Session - 04/27/15 0816    Visit Number 12   Number of Visits 25   Date for SLP Re-Evaluation 06/16/15   SLP Start Time 34   SLP Stop Time  1450   SLP Time Calculation (min) 50 min   Activity Tolerance Patient tolerated treatment well      Past Medical History  Diagnosis Date  . Restless leg syndrome unk  . Polysubstance abuse     Past Surgical History  Procedure Laterality Date  . Cholecystectomy    . Tubal ligation      There were no vitals filed for this visit.  Visit Diagnosis: Cognitive communication deficit      Subjective Assessment - 04/27/15 0815    Subjective The patient feels that she is working harder / better and is remembering better as well.   Currently in Pain? No/denies               ADULT SLP TREATMENT - 04/27/15 0001    General Information   Behavior/Cognition Alert;Cooperative;Pleasant mood  Increased frustration   HPI TBI   Treatment Provided   Treatment provided Cognitive-Linquistic   Pain Assessment   Pain Assessment No/denies pain   Cognitive-Linquistic Treatment   Treatment focused on Cognition   Skilled Treatment WORD FINDING:  complete simple verbal analogies with 80% accuracy independently.  Deduce word given 3 clue words with 80% accuracy.  ATTENTION:  The patient demonstrated improved sustained attention for difficult tasks, with less tendency to "give up".  READING COMPREHENSION:  Read mystery story (Grade 3-5) and answer factual questions with 100% accuracy; solve mystery given mod cues. REASONING: The patient required fewer cues to complete the Perplexors Level A, demonstrating  improved recall of strategies to aid solving the puzzle.   Assessment / Recommendations / Plan   Plan Continue with current plan of care   Progression Toward Goals   Progression toward goals Progressing toward goals          SLP Education - 04/27/15 0815    Education provided Yes   Education Details Need to tolerate struggle when faced with difficult task   Person(s) Educated Patient   Methods Explanation   Comprehension Verbalized understanding            SLP Long Term Goals - 04/19/15 0950    SLP LONG TERM GOAL #1   Title Patient will demonstrate functional cognitive-communication skills for independent completion of personal responsibilities.   Time 12   Period Weeks   Status Partially Met   SLP LONG TERM GOAL #2   Title Patient will complete complex executive function skills tasks with 80% accuracy.   Time 12   Period Weeks   Status Partially Met   SLP LONG TERM GOAL #3   Title Patient will complete complex attention tasks with 80% accuracy.   Time 12   Period Weeks   Status Partially Met   SLP LONG TERM GOAL #4   Title Patient will complete memory strategy activities with 80% accuracy.   Time 12   Period Weeks   Status Partially Met          Plan - 04/27/15 5176  Clinical Impression Statement The patient is attending well to simple therapeutic tasks, but exhibits greater frustration with more difficult tasks.  She is having word retrieval problems, abstract thinking problems, and visual perceptual problems.  She demonstrates improved skills in slightly more complex activities but has continued need for work with high level language, abstract thinking, and visual perceptual skills.   Speech Therapy Frequency 2x / week   Duration Other (comment)   Treatment/Interventions Compensatory techniques;Cognitive reorganization;Internal/external aids;Functional tasks;SLP instruction and feedback;Compensatory strategies;Patient/family education   Potential to Achieve  Goals Good   Potential Considerations Ability to learn/carryover information;Cooperation/participation level;Previous level of function;Severity of impairments;Family/community support   Consulted and Agree with Plan of Care Patient        Problem List There are no active problems to display for this patient.  Leroy Sea, MS/CCC- SLP  Lou Miner 04/27/2015, 8:17 AM  [place] MAIN [facility] SERVICES [address] [place], [place], [address] Phone: [phone]   Fax:  [phone]   Name: CHRISTINA GINTZ MRN: [id number] Date of Birth: [birth year]

## 2015-05-02 DIAGNOSIS — E669 Obesity, unspecified: Secondary | ICD-10-CM | POA: Insufficient documentation

## 2015-05-03 ENCOUNTER — Ambulatory Visit: Payer: Commercial Managed Care - HMO | Admitting: Physical Therapy

## 2015-05-03 ENCOUNTER — Ambulatory Visit: Payer: Commercial Managed Care - HMO | Admitting: Occupational Therapy

## 2015-05-03 ENCOUNTER — Ambulatory Visit: Payer: Commercial Managed Care - HMO | Admitting: Speech Pathology

## 2015-05-07 ENCOUNTER — Ambulatory Visit: Payer: Commercial Managed Care - HMO | Admitting: Physical Therapy

## 2015-05-07 ENCOUNTER — Ambulatory Visit: Payer: Commercial Managed Care - HMO | Attending: Pediatrics | Admitting: Occupational Therapy

## 2015-05-07 ENCOUNTER — Ambulatory Visit: Payer: Commercial Managed Care - HMO | Admitting: Speech Pathology

## 2015-05-07 DIAGNOSIS — M6281 Muscle weakness (generalized): Secondary | ICD-10-CM | POA: Insufficient documentation

## 2015-05-07 DIAGNOSIS — R2681 Unsteadiness on feet: Secondary | ICD-10-CM | POA: Insufficient documentation

## 2015-05-07 DIAGNOSIS — Z7409 Other reduced mobility: Secondary | ICD-10-CM | POA: Insufficient documentation

## 2015-05-07 DIAGNOSIS — R41841 Cognitive communication deficit: Secondary | ICD-10-CM | POA: Insufficient documentation

## 2015-05-07 DIAGNOSIS — R460 Very low level of personal hygiene: Secondary | ICD-10-CM | POA: Insufficient documentation

## 2015-05-07 DIAGNOSIS — R279 Unspecified lack of coordination: Secondary | ICD-10-CM | POA: Insufficient documentation

## 2015-05-09 ENCOUNTER — Encounter: Payer: Self-pay | Admitting: Physical Therapy

## 2015-05-09 ENCOUNTER — Ambulatory Visit: Payer: Commercial Managed Care - HMO | Admitting: Physical Therapy

## 2015-05-09 ENCOUNTER — Encounter: Payer: Self-pay | Admitting: Occupational Therapy

## 2015-05-09 ENCOUNTER — Ambulatory Visit: Payer: Commercial Managed Care - HMO | Admitting: Occupational Therapy

## 2015-05-09 ENCOUNTER — Ambulatory Visit: Payer: Commercial Managed Care - HMO | Admitting: Speech Pathology

## 2015-05-09 DIAGNOSIS — Z7409 Other reduced mobility: Secondary | ICD-10-CM | POA: Diagnosis present

## 2015-05-09 DIAGNOSIS — R460 Very low level of personal hygiene: Secondary | ICD-10-CM

## 2015-05-09 DIAGNOSIS — R41841 Cognitive communication deficit: Secondary | ICD-10-CM | POA: Diagnosis present

## 2015-05-09 DIAGNOSIS — Z741 Need for assistance with personal care: Secondary | ICD-10-CM

## 2015-05-09 DIAGNOSIS — R279 Unspecified lack of coordination: Secondary | ICD-10-CM | POA: Diagnosis not present

## 2015-05-09 DIAGNOSIS — R2681 Unsteadiness on feet: Secondary | ICD-10-CM

## 2015-05-09 DIAGNOSIS — M6281 Muscle weakness (generalized): Secondary | ICD-10-CM

## 2015-05-09 NOTE — Patient Instructions (Signed)
Instructed in ideas for fine motor at home including flipping cards and picking up coins.

## 2015-05-09 NOTE — Therapy (Signed)
[place] [facility] MAIN [facility] SERVICES [address] [place], [place], [address] Phone: [phone]   Fax:  [phone]  Physical Therapy Treatment  Patient Details  Name: Natasha [name] MRN: [id number] Date of Birth: 04-Apr-1986 Referring Provider: Doristine Mango  Encounter Date: 05/09/2015      PT End of Session - 05/09/15 1524    Visit Number 5   Number of Visits 25   Date for PT Re-Evaluation 07/05/15   PT Start Time 0315   PT Stop Time 0400   PT Time Calculation (min) 45 min   Equipment Utilized During Treatment Gait belt   Activity Tolerance Patient tolerated treatment well   Behavior During Therapy [facility] for tasks assessed/performed      Past Medical History  Diagnosis Date  . Restless leg syndrome unk  . Polysubstance abuse     Past Surgical History  Procedure Laterality Date  . Cholecystectomy    . Tubal ligation      There were no vitals filed for this visit.  Visit Diagnosis:  Lack of coordination  Muscle weakness (generalized)  Unsteady gait      Subjective Assessment - 05/09/15 1522    Subjective Pt states she is doing well today. No specific questions or concerns. Pt reports she knows that she needs to do her exercises. She wilshes that she could walk outside but it has ben so cold.            Therapeutic exercise;  standing hip abd with YTB, 4 way hip YTB x 20  side stepping left and right in parallel bars 10 feet x 3 step ups from floor to 6 inch stool x 20 bilateral Sit to stand x 10 x 2 from chair Eccentric step downs x 10 x 2 BLE Leg press x 20 x 3 130 lbs, heel raises with 90 lbs x 20 x 3 Heel raises standing x 20 repetitions      Matrix fwd/bwd 12 lbs x 3 repetitions with burning and fatigue in LE's Pt needed mod cueing when performing standing hip abd to stand up tall and keep toe pointed forward Min cuing needed to maintain posture while performing   tasks                            PT Education - 05/09/15 1523    Education provided Yes   Education Details HEP   Person(s) Educated Patient   Methods Explanation   Comprehension Verbalized understanding             PT Long Term Goals - 04/12/15 1438    PT LONG TERM GOAL #1   Title Patient will reduce timed up and go to <11 seconds to reduce fall risk and demonstrate improved transfer/gait ability.   Time 12   Period Weeks   PT LONG TERM GOAL #2   Title Patient will increase six minute walk test distance to >1000 for progression to community ambulator and improve gait ability   Time 12   Period Weeks   PT LONG TERM GOAL #3   Title Patient will increase 10 meter walk test to >1.45m/s as to improve gait speed for better community ambulation and to reduce fall    Time 12   Period Weeks   PT LONG TERM GOAL #4   Title Patient will tolerate 5 seconds of single leg stance without loss of balance to improve ability to get in  and out of shower safely   Time 12   Period Weeks   PT LONG TERM GOAL #5   Title Patient (< 30 years old) will complete five times sit to stand test in < 10 seconds indicating an increased LE strength and improved balance   Time 12   Period Weeks               Plan - 05/09/15 1525    Clinical Impression Statement Pt demonstrated decreased endurance with sit-to-stands exhibiting fatigue towards the end of second set with increased breathing rate. Min cueing needed during hip 3-way to maintain upright posture with knees straight. Pt had good performance of therapeutic exercise today.   Pt will benefit from skilled therapeutic intervention in order to improve on the following deficits Abnormal gait;Decreased balance;Decreased endurance;Decreased mobility;Difficulty walking;Decreased cognition;Decreased safety awareness;Decreased strength;Impaired UE functional use;Obesity   Rehab Potential Good   PT Frequency 2x / week   PT Duration  12 weeks   PT Treatment/Interventions Therapeutic activities;Therapeutic exercise;Balance training;Neuromuscular re-education;Stair training;Gait training   PT Next Visit Plan strengthening and balance training   PT Home Exercise Plan balance in corner   Consulted and Agree with Plan of Care Patient        Problem List There are no active problems to display for this patient.   [name], Dakwan Pridgen S 05/09/2015, 3:27 PM  [place] [facility] [address] [place], [place], [address] Phone: [phone]   Fax:  [phone]  Name: Hector ShadeWhitney B Gayton MRN: [id number] Date of Birth: 01/31/1986
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
## 2015-05-10 ENCOUNTER — Encounter: Payer: Self-pay | Admitting: Speech Pathology

## 2015-05-10 NOTE — Therapy (Signed)
[place] MAIN [facility] SERVICES [place], [place], [address] Phone: [phone]   Fax:  [phone]  Speech Language Pathology Treatment  Patient Details  Name: NAVPREET SZCZYGIEL MRN: [id number] Date of Birth: 1985/05/10 Referring Provider: Dr. Janene Harvey  Encounter Date: 05/09/2015      End of Session - 05/10/15 1220    Visit Number 13   Number of Visits 25   Date for SLP Re-Evaluation 06/16/15   SLP Start Time 1600   SLP Stop Time  1650   SLP Time Calculation (min) 50 min   Activity Tolerance Patient tolerated treatment well      Past Medical History  Diagnosis Date  . Restless leg syndrome unk  . Polysubstance abuse     Past Surgical History  Procedure Laterality Date  . Cholecystectomy    . Tubal ligation      There were no vitals filed for this visit.  Visit Diagnosis: Cognitive communication deficit      Subjective Assessment - 05/10/15 1219    Subjective The patient feels that she is working harder / better and is remembering better as well.   Currently in Pain? No/denies               ADULT SLP TREATMENT - 05/10/15 0001    General Information   Behavior/Cognition Alert;Cooperative;Pleasant mood  Increased frustration   HPI TBI   Treatment Provided   Treatment provided Cognitive-Linquistic   Pain Assessment   Pain Assessment No/denies pain   Cognitive-Linquistic Treatment   Treatment focused on Cognition   Skilled Treatment WORD FINDING:  complete simple verbal analogies with 80% accuracy independently.  Deduce word given 3 clue words with 80% accuracy.  Patient demonstrates improved tolerance for struggle with attempts to reason an appropriate response before giving up.  ATTENTION:  The patient demonstrated improved sustained attention for difficult tasks, with less tendency to "give up".  READING COMPREHENSION:  Read mystery story (Grade 3-5) and answer factual questions with 100% accuracy;  solve mystery given mod cues. REASONING: The patient required fewer cues to complete the Perplexors Level A, demonstrating improved recall of strategies to aid solving the puzzle.   Assessment / Recommendations / Plan   Plan Continue with current plan of care   Progression Toward Goals   Progression toward goals Progressing toward goals          SLP Education - 05/10/15 1219    Education provided Yes   Education Details Need to tolerate struggle when faced with difficult task   Person(s) Educated Patient   Methods Explanation   Comprehension Verbalized understanding            SLP Long Term Goals - 04/19/15 0950    SLP LONG TERM GOAL #1   Title Patient will demonstrate functional cognitive-communication skills for independent completion of personal responsibilities.   Time 12   Period Weeks   Status Partially Met   SLP LONG TERM GOAL #2   Title Patient will complete complex executive function skills tasks with 80% accuracy.   Time 12   Period Weeks   Status Partially Met   SLP LONG TERM GOAL #3   Title Patient will complete complex attention tasks with 80% accuracy.   Time 12   Period Weeks   Status Partially Met   SLP LONG TERM GOAL #4   Title Patient will complete memory strategy activities with 80% accuracy.   Time 12   Period Weeks  Status Partially Met          Plan - 05/10/15 1220    Clinical Impression Statement The patient is attending well to simple therapeutic tasks, but exhibits greater frustration with more difficult tasks.  She is having word retrieval problems, abstract thinking problems, and visual perceptual problems.  She demonstrates improved skills in slightly more complex activities but has continued need for work with high level language, abstract thinking, and visual perceptual skills.   Speech Therapy Frequency 2x / week   Duration Other (comment)   Treatment/Interventions Compensatory techniques;Cognitive reorganization;Internal/external  aids;Functional tasks;SLP instruction and feedback;Compensatory strategies;Patient/family education   Potential to Achieve Goals Good   Potential Considerations Ability to learn/carryover information;Cooperation/participation level;Previous level of function;Severity of impairments;Family/community support   Consulted and Agree with Plan of Care Patient        Problem List There are no active problems to display for this patient.  Leroy Sea, MS/CCC- SLP  Lou Miner 05/10/2015, 12:21 PM  [place] MAIN [facility] SERVICES [address] [place], [place], [address] Phone: [phone]   Fax:  [phone]   Name: MATALYN NAWAZ MRN: [id number] Date of Birth: 05-Aug-1985

## 2015-05-14 ENCOUNTER — Encounter: Payer: Self-pay | Admitting: Physical Therapy

## 2015-05-14 ENCOUNTER — Encounter: Payer: Self-pay | Admitting: Occupational Therapy

## 2015-05-14 ENCOUNTER — Ambulatory Visit: Payer: Commercial Managed Care - HMO | Admitting: Occupational Therapy

## 2015-05-14 ENCOUNTER — Ambulatory Visit: Payer: Commercial Managed Care - HMO | Admitting: Speech Pathology

## 2015-05-14 ENCOUNTER — Ambulatory Visit: Payer: Commercial Managed Care - HMO | Admitting: Physical Therapy

## 2015-05-14 DIAGNOSIS — M6281 Muscle weakness (generalized): Secondary | ICD-10-CM

## 2015-05-14 DIAGNOSIS — R279 Unspecified lack of coordination: Secondary | ICD-10-CM

## 2015-05-14 DIAGNOSIS — Z741 Need for assistance with personal care: Secondary | ICD-10-CM

## 2015-05-14 DIAGNOSIS — R460 Very low level of personal hygiene: Secondary | ICD-10-CM

## 2015-05-14 DIAGNOSIS — R2681 Unsteadiness on feet: Secondary | ICD-10-CM

## 2015-05-14 DIAGNOSIS — Z7409 Other reduced mobility: Secondary | ICD-10-CM

## 2015-05-14 DIAGNOSIS — R41841 Cognitive communication deficit: Secondary | ICD-10-CM

## 2015-05-14 NOTE — Therapy (Signed)
[place] [facility] [address] [place], [place], [address] Phone: [phone]   Fax:  [phone]  Physical Therapy Treatment  Patient Details  Name: Natasha ShadeWhitney B [name] MRN: [id number] Date of Birth: 01/04/1986 Referring Provider: Doristine MangoWhite elizabeth  Encounter Date: 05/14/2015      PT End of Session - 05/14/15 1433    Visit Number 6   Number of Visits 25   Date for PT Re-Evaluation 07/05/15   PT Start Time 0230   PT Stop Time 0300   PT Time Calculation (min) 30 min   Equipment Utilized During Treatment Gait belt   Activity Tolerance Patient tolerated treatment well   Behavior During Therapy [facility] for tasks assessed/performed      Past Medical History  Diagnosis Date  . Restless leg syndrome unk  . Polysubstance abuse     Past Surgical History  Procedure Laterality Date  . Cholecystectomy    . Tubal ligation      There were no vitals filed for this visit.  Visit Diagnosis:  Lack of coordination  Muscle weakness (generalized)  Unsteady gait  Impaired functional mobility, balance, gait, and endurance      Subjective Assessment - 05/14/15 1432    Subjective Pt states she is doing well today. No specific questions or concerns. Pt reports she knows that she needs to do her exercises. She wilshes that she could walk outside but it has ben so cold.       THER-EX Standing exercises with 2# ankle weights: Marching 2 x 10; SLR 2 x 10; Abduction 2 x 10; Extension 2 x 10; Knee flexion 2 x 10; Heel raises 2 x 10;  Resisted side-steeping RTB 4 lengths x 2; Standing mini squats 2 x 10 with RTB around knees to encourage abduction Quantum leg press 105# x 10, 120# x  Mod cueing needed to appropriately perform strengthening tasks with leg,and head position. Decreased coordination demonstrated requiring consistent verbal cueing to correct form. Cognitive understanding of task was delayed. Patient continues to demonstrate  some in coordination of movement. Patient responds well to verbal and tactile cues to correct form and technique.  CGA to SBA for safety with activities.  Uses to increase intensity and amplitude of movements throughout session                            PT Education - 05/14/15 1433    Education provided Yes   Education Details HEP   Person(s) Educated Patient   Methods Explanation   Comprehension Verbalized understanding             PT Long Term Goals - 04/12/15 1438    PT LONG TERM GOAL #1   Title Patient will reduce timed up and go to <11 seconds to reduce fall risk and demonstrate improved transfer/gait ability.   Time 12   Period Weeks   PT LONG TERM GOAL #2   Title Patient will increase six minute walk test distance to >1000 for progression to community ambulator and improve gait ability   Time 12   Period Weeks   PT LONG TERM GOAL #3   Title Patient will increase 10 meter walk test to >1.7034m/s as to improve gait speed for better community ambulation and to reduce fall    Time 12   Period Weeks   PT LONG TERM GOAL #4   Title Patient will tolerate 5 seconds of single leg  stance without loss of balance to improve ability to get in and out of shower safely   Time 12   Period Weeks   PT LONG TERM GOAL #5   Title Patient (< 110 years old) will complete five times sit to stand test in < 10 seconds indicating an increased LE strength and improved balance   Time 12   Period Weeks               Plan - 05/14/15 1434    Clinical Impression Statement CGA to SBA for safety with activities.  Uses to increase intensity and amplitude of movements throughout session   Pt will benefit from skilled therapeutic intervention in order to improve on the following deficits Abnormal gait;Decreased balance;Decreased endurance;Decreased mobility;Difficulty walking;Decreased cognition;Decreased safety awareness;Decreased strength;Impaired UE functional use;Obesity    Rehab Potential Good   PT Frequency 2x / week   PT Duration 12 weeks   PT Treatment/Interventions Therapeutic activities;Therapeutic exercise;Balance training;Neuromuscular re-education;Stair training;Gait training   PT Next Visit Plan strengthening and balance training   PT Home Exercise Plan balance in corner   Consulted and Agree with Plan of Care Patient        Problem List There are no active problems to display for this patient.   Natasha [name] 05/14/2015, 2:35 PM  [place] [facility] MAIN [facility] SERVICES [address] [place], [place], [address] Phone: [phone]   Fax:  [phone]  Name: Natasha [name] MRN: [id number] Date of Birth: [birth year]
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
## 2015-05-14 NOTE — Patient Instructions (Signed)
Instructed in techniques for fine motor.

## 2015-05-15 ENCOUNTER — Encounter: Payer: Self-pay | Admitting: Speech Pathology

## 2015-05-15 NOTE — Therapy (Signed)
[place] MAIN [facility] SERVICES [place], [place], [address] Phone: [phone]   Fax:  [phone]  Speech Language Pathology Treatment  Patient Details  Name: Natasha [name] MRN: [id number] Date of Birth: February 20, 1986 Referring Provider: Dr. Janene Harvey  Encounter Date: 05/14/2015      End of Session - 05/15/15 1247    Visit Number 14   Number of Visits 25   Date for SLP Re-Evaluation 06/16/15   SLP Start Time 1500   SLP Stop Time  1558   SLP Time Calculation (min) 58 min   Activity Tolerance Patient tolerated treatment well      Past Medical History  Diagnosis Date  . Restless leg syndrome unk  . Polysubstance abuse     Past Surgical History  Procedure Laterality Date  . Cholecystectomy    . Tubal ligation      There were no vitals filed for this visit.  Visit Diagnosis: Cognitive communication deficit      Subjective Assessment - 05/15/15 1246    Subjective The patient feels that she is working harder / better and is remembering better as well.   Currently in Pain? No/denies               ADULT SLP TREATMENT - 05/15/15 0001    General Information   Behavior/Cognition Alert;Cooperative;Pleasant mood  Decreased frustration   HPI TBI   Treatment Provided   Treatment provided Cognitive-Linquistic   Pain Assessment   Pain Assessment No/denies pain   Cognitive-Linquistic Treatment   Treatment focused on Cognition   Skilled Treatment WORD FINDING:  complete simple verbal analogies with 80% accuracy independently.  Deduce word given 3 clue words with 80% accuracy.  Patient demonstrates improved tolerance for struggle with attempts to reason an appropriate response independently.  ATTENTION:  The patient demonstrated improved sustained attention for difficult tasks, with less tendency to "give up".  READING COMPREHENSION:  Read mystery story (Grade 3-5) and answer factual questions with 100% accuracy; solve  mystery given no cues. REASONING: The patient required fewer cues to complete the Perplexors Level A, demonstrating improved recall of strategies to aid solving the puzzle.   Assessment / Recommendations / Plan   Plan Continue with current plan of care   Progression Toward Goals   Progression toward goals Progressing toward goals          SLP Education - 05/15/15 1247    Education provided Yes   Education Details Self cuing strategies   Person(s) Educated Patient   Methods Explanation   Comprehension Verbalized understanding            SLP Long Term Goals - 04/19/15 0950    SLP LONG TERM GOAL #1   Title Patient will demonstrate functional cognitive-communication skills for independent completion of personal responsibilities.   Time 12   Period Weeks   Status Partially Met   SLP LONG TERM GOAL #2   Title Patient will complete complex executive function skills tasks with 80% accuracy.   Time 12   Period Weeks   Status Partially Met   SLP LONG TERM GOAL #3   Title Patient will complete complex attention tasks with 80% accuracy.   Time 12   Period Weeks   Status Partially Met   SLP LONG TERM GOAL #4   Title Patient will complete memory strategy activities with 80% accuracy.   Time 12   Period Weeks   Status Partially Met  Plan - 05/15/15 1248    Speech Therapy Frequency 2x / week   Duration Other (comment)   Treatment/Interventions Compensatory techniques;Cognitive reorganization;Internal/external aids;Functional tasks;SLP instruction and feedback;Compensatory strategies;Patient/family education   Potential to Achieve Goals Good   Potential Considerations Ability to learn/carryover information;Cooperation/participation level;Previous level of function;Severity of impairments;Family/community support   Consulted and Agree with Plan of Care Patient        Problem List There are no active problems to display for this patient.  Leroy Sea,  MS/CCC- SLP  Lou Miner 05/15/2015, 12:49 PM  [place] MAIN [facility] SERVICES [address] [place], [place], [address] Phone: [phone]   Fax:  [phone]   Name: Natasha [name] MRN: [id number] Date of Birth: 15-Jun-1985

## 2015-05-17 ENCOUNTER — Ambulatory Visit: Payer: Commercial Managed Care - HMO | Admitting: Occupational Therapy

## 2015-05-17 ENCOUNTER — Ambulatory Visit: Payer: Commercial Managed Care - HMO | Admitting: Physical Therapy

## 2015-05-17 ENCOUNTER — Encounter: Payer: Self-pay | Admitting: Physical Therapy

## 2015-05-17 DIAGNOSIS — R460 Very low level of personal hygiene: Secondary | ICD-10-CM

## 2015-05-17 DIAGNOSIS — M6281 Muscle weakness (generalized): Secondary | ICD-10-CM

## 2015-05-17 DIAGNOSIS — R279 Unspecified lack of coordination: Secondary | ICD-10-CM

## 2015-05-17 DIAGNOSIS — R2681 Unsteadiness on feet: Secondary | ICD-10-CM

## 2015-05-17 DIAGNOSIS — Z741 Need for assistance with personal care: Secondary | ICD-10-CM

## 2015-05-17 DIAGNOSIS — Z7409 Other reduced mobility: Secondary | ICD-10-CM

## 2015-05-17 NOTE — Patient Instructions (Signed)
Instructed to keep up home program.

## 2015-05-17 NOTE — Therapy (Signed)
[place] [facility] MAIN [facility] SERVICES [address] [place], [place], [address] Phone: [phone]   Fax:  [phone]  Physical Therapy Treatment  Patient Details  Name: Natasha [name] MRN: [id number] Date of Birth: 1985/10/02 Referring Provider: Doristine Mango  Encounter Date: 05/17/2015      PT End of Session - 05/17/15 1413    Visit Number 7   Number of Visits 25   Date for PT Re-Evaluation 07/05/15   PT Start Time 0145   PT Stop Time 0230   PT Time Calculation (min) 45 min   Equipment Utilized During Treatment Gait belt   Activity Tolerance Patient tolerated treatment well   Behavior During Therapy [facility] for tasks assessed/performed      Past Medical History  Diagnosis Date  . Restless leg syndrome unk  . Polysubstance abuse     Past Surgical History  Procedure Laterality Date  . Cholecystectomy    . Tubal ligation      There were no vitals filed for this visit.  Visit Diagnosis:  Lack of coordination  Muscle weakness (generalized)  Unsteady gait  Impaired functional mobility, balance, gait, and endurance      Subjective Assessment - 05/17/15 1412    Subjective Pt states she is doing well today. No specific questions or concerns. Pt reports she knows that she needs to do her exercises. She wilshes that she could walk outside but it has ben so cold.    Currently in Pain? No/denies      THER-EX Standing exercises with 2# ankle weights: Marching 2 x 10; SLR 2 x 10; Abduction 2 x 10; Extension 2 x 10; Knee flexion 2 x 10; Heel raises 2 x 10;  Resisted side-steeping RTB 4 lengths x 2; Standing mini squats 2 x 10 with RTB around knees to encourage abduction; Sit to stand without UE support 2 x 10; Step-ups to 6" step x 10 bilateral; Eccentric step downs from 4 inch stool  Quantum leg press 105# x 10, 120# x 10;  NEUROMUSCULAR RE-EDUCATION Airex NBOS eyes open/closed x 30 seconds each; Airex NBOS eyes open  horizontal and vertical head turns x 30 seconds; Airex cone taps alternating LE x 60 seconds; Tandem gait in // bars x 4 laps Patient needs occasional verbal cueing to improve posture and cueing to correctly perform exercises slowly, holding at end of range to increase motor firing of desired muscle to encourage fatigue.                            PT Education - 05/17/15 1413    Education provided Yes   Education Details HEP   Person(s) Educated Patient   Methods Explanation   Comprehension Verbalized understanding             PT Long Term Goals - 04/12/15 1438    PT LONG TERM GOAL #1   Title Patient will reduce timed up and go to <11 seconds to reduce fall risk and demonstrate improved transfer/gait ability.   Time 12   Period Weeks   PT LONG TERM GOAL #2   Title Patient will increase six minute walk test distance to >1000 for progression to community ambulator and improve gait ability   Time 12   Period Weeks   PT LONG TERM GOAL #3   Title Patient will increase 10 meter walk test to >1.60m/s as to improve gait speed for better community ambulation and  to reduce fall    Time 12   Period Weeks   PT LONG TERM GOAL #4   Title Patient will tolerate 5 seconds of single leg stance without loss of balance to improve ability to get in and out of shower safely   Time 12   Period Weeks   PT LONG TERM GOAL #5   Title Patient (< 26 years old) will complete five times sit to stand test in < 10 seconds indicating an increased LE strength and improved balance   Time 12   Period Weeks               Plan - 05/17/15 1413    Clinical Impression Statement Fatigue with sit to stand but demonstrating more control, Increase weight for standing exercises. Fatigue still evident with cross trainer and endurance   Pt will benefit from skilled therapeutic intervention in order to improve on the following deficits Abnormal gait;Decreased balance;Decreased  endurance;Decreased mobility;Difficulty walking;Decreased cognition;Decreased safety awareness;Decreased strength;Impaired UE functional use;Obesity   Rehab Potential Good   PT Frequency 2x / week   PT Duration 12 weeks   PT Treatment/Interventions Therapeutic activities;Therapeutic exercise;Balance training;Neuromuscular re-education;Stair training;Gait training   PT Next Visit Plan strengthening and balance training   PT Home Exercise Plan balance in corner   Consulted and Agree with Plan of Care Patient        Problem List There are no active problems to display for this patient. [name], PT, DPT  [place], [place] 05/17/2015, 2:16 PM  [place] [facility] MAIN [facility] SERVICES [address] [place], [place], [address] Phone: [phone]   Fax:  [phone]  Name: Natasha [name] MRN: [id number] Date of Birth: Nov 01, 1985
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
## 2015-05-21 ENCOUNTER — Encounter: Payer: Self-pay | Admitting: Occupational Therapy

## 2015-05-21 ENCOUNTER — Ambulatory Visit: Payer: Commercial Managed Care - HMO | Admitting: Speech Pathology

## 2015-05-21 ENCOUNTER — Encounter: Payer: Self-pay | Admitting: Physical Therapy

## 2015-05-21 ENCOUNTER — Ambulatory Visit: Payer: Commercial Managed Care - HMO | Admitting: Occupational Therapy

## 2015-05-21 ENCOUNTER — Ambulatory Visit: Payer: Commercial Managed Care - HMO | Admitting: Physical Therapy

## 2015-05-21 DIAGNOSIS — M6281 Muscle weakness (generalized): Secondary | ICD-10-CM

## 2015-05-21 DIAGNOSIS — R279 Unspecified lack of coordination: Secondary | ICD-10-CM | POA: Diagnosis not present

## 2015-05-21 DIAGNOSIS — R2681 Unsteadiness on feet: Secondary | ICD-10-CM

## 2015-05-21 DIAGNOSIS — Z741 Need for assistance with personal care: Secondary | ICD-10-CM

## 2015-05-21 DIAGNOSIS — R460 Very low level of personal hygiene: Secondary | ICD-10-CM

## 2015-05-21 DIAGNOSIS — R41841 Cognitive communication deficit: Secondary | ICD-10-CM

## 2015-05-21 DIAGNOSIS — Z7409 Other reduced mobility: Secondary | ICD-10-CM
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
## 2015-05-21 NOTE — Patient Instructions (Signed)
Continue to encourage patient to do home program.

## 2015-05-21 NOTE — Therapy (Signed)
[facility] MAIN [facility] SERVICES [address] [place], [place], [address] Phone: [phone]   Fax:  [phone]  Physical Therapy Treatment  Patient Details  Name: MIYANNA WIERSMA MRN: [id number] Date of Birth: [birth year] Referring Provider: Doristine Mango  Encounter Date: 05/21/2015      PT End of Session - 05/21/15 1531    Visit Number 8   Number of Visits 25   Date for PT Re-Evaluation 07/05/15   PT Start Time 0320   PT Stop Time 0400   PT Time Calculation (min) 40 min   Equipment Utilized During Treatment Gait belt   Activity Tolerance Patient tolerated treatment well   Behavior During Therapy [facility] for tasks assessed/performed      Past Medical History  Diagnosis Date  . Restless leg syndrome unk  . Polysubstance abuse     Past Surgical History  Procedure Laterality Date  . Cholecystectomy    . Tubal ligation      There were no vitals filed for this visit.  Visit Diagnosis:  Lack of coordination  Muscle weakness (generalized)  Unsteady gait  Impaired functional mobility, balance, gait, and endurance      Subjective Assessment - 05/21/15 1528    Subjective Pt states she is doing well today. No specific questions or concerns. Pt reports she knows that she needs to do her exercises. She wilshes that she could walk outside but it has ben so cold.    Currently in Pain? No/denies      THER-EX Standing exercises with 2# ankle weights: Marching 2 x 10; SLR 2 x 10; Abduction 2 x 10; Extension 2 x 10; Knee flexion 2 x 10; Heel raises 2 x 10;  Resisted side-steeping RTB 4 lengths x 2; Standing mini squats 2 x 10 with RTB around knees to encourage abduction; Sit to stand without UE support 2 x 10; Step-ups to 6" step x 10 bilateral; Quantum leg press 105# x 10, 120# x 10;  NEUROMUSCULAR RE-EDUCATION NBOS eyes open/closed x 30 seconds each;  NBOS eyes open horizontal and vertical head turns x 30 seconds;  cone  taps alternating LE x 60 seconds; Tandem gait in // bars x 4 laps Strengthening cues Patient needs occasional verbal cueing to improve posture and cueing to correctly perform exercises slowly, holding at end of range to increase motor firing of desired muscle to encourage fatigue.                             PT Education - 05/21/15 1531    Education provided Yes   Education Details HEP   Person(s) Educated Patient   Methods Explanation   Comprehension Verbalized understanding             PT Long Term Goals - 04/12/15 1438    PT LONG TERM GOAL #1   Title Patient will reduce timed up and go to <11 seconds to reduce fall risk and demonstrate improved transfer/gait ability.   Time 12   Period Weeks   PT LONG TERM GOAL #2   Title Patient will increase six minute walk test distance to >1000 for progression to community ambulator and improve gait ability   Time 12   Period Weeks   PT LONG TERM GOAL #3   Title Patient will increase 10 meter walk test to >1.87m/s as to improve gait speed for better community ambulation and to reduce fall  Time 12   Period Weeks   PT LONG TERM GOAL #4   Title Patient will tolerate 5 seconds of single leg stance without loss of balance to improve ability to get in and out of shower safely   Time 12   Period Weeks   PT LONG TERM GOAL #5   Title Patient (< 30 years old) will complete five times sit to stand test in < 10 seconds indicating an increased LE strength and improved balance   Time 12   Period Weeks               Plan - 05/21/15 1532    Clinical Impression Statement  CGA needed with all activities except walking on 2x4 which needed Min A.   Pt will benefit from skilled therapeutic intervention in order to improve on the following deficits Abnormal gait;Decreased balance;Decreased endurance;Decreased mobility;Difficulty walking;Decreased cognition;Decreased safety awareness;Decreased strength;Impaired UE  functional use;Obesity   Rehab Potential Good   PT Frequency 2x / week   PT Duration 12 weeks   PT Treatment/Interventions Therapeutic activities;Therapeutic exercise;Balance training;Neuromuscular re-education;Stair training;Gait training   PT Next Visit Plan strengthening and balance training   PT Home Exercise Plan balance in corner   Consulted and Agree with Plan of Care Patient        Problem List There are no active problems to display for this patient.  [name], PT, DPT [place], Barkley Bruns S 05/21/2015, 3:34 PM  [facility] MAIN [facility] SERVICES [address] [place], [place], [address] Phone: [phone]   Fax:  [phone]  Name: ANAYAH ARVANITIS MRN: [id number] Date of Birth: Sep 12, 1985

## 2015-05-22 ENCOUNTER — Encounter: Payer: 59 | Admitting: Speech Pathology

## 2015-05-22 ENCOUNTER — Ambulatory Visit: Payer: 59 | Admitting: Physical Therapy

## 2015-05-22 ENCOUNTER — Encounter: Payer: Self-pay | Admitting: Speech Pathology

## 2015-05-22 NOTE — Therapy (Signed)
[place] MAIN [facility] SERVICES [address] [place], [place], [address] Phone: [phone]   Fax:  [phone]  Speech Language Pathology Treatment  Patient Details  Name: Natasha [name] MRN: [id number] Date of Birth: [birth year] Referring Provider: Dr. Janene Harvey  Encounter Date: 05/21/2015      End of Session - 05/22/15 1044    Visit Number 15   Number of Visits 25   Date for SLP Re-Evaluation 06/16/15   SLP Start Time 31   SLP Stop Time  1650   SLP Time Calculation (min) 50 min   Activity Tolerance Patient tolerated treatment well      Past Medical History  Diagnosis Date  . Restless leg syndrome unk  . Polysubstance abuse     Past Surgical History  Procedure Laterality Date  . Cholecystectomy    . Tubal ligation      There were no vitals filed for this visit.  Visit Diagnosis: Cognitive communication deficit      Subjective Assessment - 05/22/15 1043    Subjective The patient feels that she is working harder / better and is remembering better as well.   Currently in Pain? No/denies               ADULT SLP TREATMENT - 05/22/15 0001    General Information   Behavior/Cognition Alert;Cooperative;Pleasant mood  Decreased frustration   HPI TBI   Treatment Provided   Treatment provided Cognitive-Linquistic   Pain Assessment   Pain Assessment No/denies pain   Cognitive-Linquistic Treatment   Treatment focused on Cognition   Skilled Treatment WORD FINDING:  complete simple verbal analogies with 80% accuracy independently.  Deduce word given 3 clue words with 80% accuracy.  Patient demonstrates improved tolerance for struggle with attempts to reason an appropriate response independently.  ATTENTION:  The patient demonstrated improved sustained attention for difficult tasks, with less tendency to "give up".  READING COMPREHENSION:  Read mystery story (Grade 3-5) and answer factual questions with 100% accuracy (given  min cues to locate information in the text); solve mystery given min cues. REASONING: The patient required fewer cues to complete the Perplexors Level A, demonstrating improved recall of strategies to aid solving the puzzle.   Assessment / Recommendations / Plan   Plan Continue with current plan of care   Progression Toward Goals   Progression toward goals Progressing toward goals          SLP Education - 05/22/15 1044    Education provided Yes   Education Details Need to tolerate struggle when faced with difficult task   Person(s) Educated Patient   Methods Explanation   Comprehension Verbalized understanding            SLP Long Term Goals - 04/19/15 0950    SLP LONG TERM GOAL #1   Title Patient will demonstrate functional cognitive-communication skills for independent completion of personal responsibilities.   Time 12   Period Weeks   Status Partially Met   SLP LONG TERM GOAL #2   Title Patient will complete complex executive function skills tasks with 80% accuracy.   Time 12   Period Weeks   Status Partially Met   SLP LONG TERM GOAL #3   Title Patient will complete complex attention tasks with 80% accuracy.   Time 12   Period Weeks   Status Partially Met   SLP LONG TERM GOAL #4   Title Patient will complete memory strategy activities with 80% accuracy.   Time  12   Period Weeks   Status Partially Met          Plan - 05/22/15 1044    Clinical Impression Statement The patient is attending well to simple cognitive tasks and language tasks, but exhibits greater frustration with more difficult reasoning tasks.  She is having word retrieval problems, abstract thinking problems, and visual perceptual problems.  She demonstrates improved skills in slightly more complex activities but has continued need for work with high level language, abstract thinking, and visual perceptual skills.   Speech Therapy Frequency 2x / week   Duration Other (comment)    Treatment/Interventions Compensatory techniques;Cognitive reorganization;Internal/external aids;Functional tasks;SLP instruction and feedback;Compensatory strategies;Patient/family education   Potential to Achieve Goals Good   Potential Considerations Ability to learn/carryover information;Cooperation/participation level;Previous level of function;Severity of impairments;Family/community support   Consulted and Agree with Plan of Care Patient        Problem List There are no active problems to display for this patient.  Leroy Sea, MS/CCC- SLP  Lou Miner 05/22/2015, 10:45 AM  [place] MAIN [facility] SERVICES [address] [place], [place], [address] Phone: [phone]   Fax:  [phone]   Name: Natasha [name] MRN: [id number] Date of Birth: 14-Apr-1986

## 2015-05-24 ENCOUNTER — Encounter: Payer: Self-pay | Admitting: Occupational Therapy

## 2015-05-24 ENCOUNTER — Ambulatory Visit: Payer: Commercial Managed Care - HMO | Admitting: Physical Therapy

## 2015-05-24 ENCOUNTER — Encounter: Payer: Self-pay | Admitting: Physical Therapy

## 2015-05-24 ENCOUNTER — Ambulatory Visit: Payer: Commercial Managed Care - HMO | Admitting: Speech Pathology

## 2015-05-24 ENCOUNTER — Ambulatory Visit: Payer: Commercial Managed Care - HMO | Admitting: Occupational Therapy

## 2015-05-24 DIAGNOSIS — R460 Very low level of personal hygiene: Secondary | ICD-10-CM

## 2015-05-24 DIAGNOSIS — R2681 Unsteadiness on feet: Secondary | ICD-10-CM

## 2015-05-24 DIAGNOSIS — R279 Unspecified lack of coordination: Secondary | ICD-10-CM

## 2015-05-24 DIAGNOSIS — R41841 Cognitive communication deficit: Secondary | ICD-10-CM

## 2015-05-24 DIAGNOSIS — M6281 Muscle weakness (generalized): Secondary | ICD-10-CM

## 2015-05-24 DIAGNOSIS — Z741 Need for assistance with personal care: Secondary | ICD-10-CM

## 2015-05-24 NOTE — Therapy (Signed)
[facility] MAIN [facility] SERVICES [address] [place], [place], [address] Phone: [phone]   Fax:  [phone]  Physical Therapy Treatment  Patient Details  Name: Natasha [name] MRN: [id number] Date of Birth: Jul 08, 1985 Referring Provider: Doristine Mango  Encounter Date: 05/24/2015      PT End of Session - 05/24/15 1503    Visit Number 9   Number of Visits 25   Date for PT Re-Evaluation 07/05/15   PT Start Time 0315   PT Stop Time 0400   PT Time Calculation (min) 45 min   Equipment Utilized During Treatment Gait belt   Activity Tolerance Patient tolerated treatment well   Behavior During Therapy [facility] for tasks assessed/performed      Past Medical History  Diagnosis Date  . Restless leg syndrome unk  . Polysubstance abuse     Past Surgical History  Procedure Laterality Date  . Cholecystectomy    . Tubal ligation      There were no vitals filed for this visit.  Visit Diagnosis:  Lack of coordination  Muscle weakness (generalized)  Unsteady gait      Subjective Assessment - 05/24/15 1502    Subjective Pt states she is doing well today. No specific questions or concerns. Pt reports she knows that she needs to do her exercises. She wilshes that she could walk outside but it has ben so cold.       Gait training with spc and 500 feet and min asssit back wards stepping with CGA x 20 feet  standing hip abd with YTB x 20  side stepping left and right in parallel bars 10 feet x 3 standing on blue foam with cone reaching x 20 across midline step ups from floor to 6 inch stool x 20 bilateral sit to stand x 10 marching in parallel bars x 20 stepping pattern with weight shifting fwd/bwd x 10.  Min cueing needed to appropriately perform strengthening tasks with leg and head position. Decreased coordination demonstrated requiring consistent verbal cueing to correct form. Cognitive understanding of task was delayed. Patient  continues to demonstrate some in coordination of movement with select exercises such as  stepping backwards. Patient responds well to verbal and tactile cues to correct form and technique.  CGA to SBA for safety with activities.  Uses to increase intensity and amplitude of movements throughout session                          PT Education - 05/24/15 1502    Education provided Yes   Education Details HEP   Person(s) Educated Patient   Methods Explanation   Comprehension Verbalized understanding             PT Long Term Goals - 04/12/15 1438    PT LONG TERM GOAL #1   Title Patient will reduce timed up and go to <11 seconds to reduce fall risk and demonstrate improved transfer/gait ability.   Time 12   Period Weeks   PT LONG TERM GOAL #2   Title Patient will increase six minute walk test distance to >1000 for progression to community ambulator and improve gait ability   Time 12   Period Weeks   PT LONG TERM GOAL #3   Title Patient will increase 10 meter walk test to >1.82m/s as to improve gait speed for better community ambulation and to reduce fall    Time 12   Period Weeks  PT LONG TERM GOAL #4   Title Patient will tolerate 5 seconds of single leg stance without loss of balance to improve ability to get in and out of shower safely   Time 12   Period Weeks   PT LONG TERM GOAL #5   Title Patient (< 49 years old) will complete five times sit to stand test in < 10 seconds indicating an increased LE strength and improved balance   Time 12   Period Weeks               Plan - 05/24/15 1504    Clinical Impression Statement PT provided min - moderate verbal instruction to improve set up, proper use of LE, and improved posture and gait mechanics. Patient responded moderately to instruction   Pt will benefit from skilled therapeutic intervention in order to improve on the following deficits Abnormal gait;Decreased balance;Decreased endurance;Decreased  mobility;Difficulty walking;Decreased cognition;Decreased safety awareness;Decreased strength;Impaired UE functional use;Obesity   Rehab Potential Good   PT Frequency 2x / week   PT Duration 12 weeks   PT Treatment/Interventions Therapeutic activities;Therapeutic exercise;Balance training;Neuromuscular re-education;Stair training;Gait training   PT Next Visit Plan strengthening and balance training   PT Home Exercise Plan balance in corner   Consulted and Agree with Plan of Care Patient        Problem List There are no active problems to display for this patient.   [name] 05/24/2015, 4:03 PM  [facility] MAIN [facility] SERVICES [address] [place], [place], [address] Phone: [phone]   Fax:  [phone]  Name: Natasha [name] MRN: [id number] Date of Birth: 09-23-1985

## 2015-05-24 NOTE — Patient Instructions (Signed)
Instructed in safety when preparing coffee.
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
## 2015-05-25 ENCOUNTER — Encounter: Payer: Self-pay | Admitting: Speech Pathology

## 2015-05-25 NOTE — Therapy (Signed)
[place] MAIN [facility] SERVICES [address] [place], [place], [address] Phone: [phone]   Fax:  [phone]  Speech Language Pathology Treatment  Patient Details  Name: Natasha [name] MRN: [id number] Date of Birth: Feb 02, 1986 Referring Provider: Dr. Janene Harvey  Encounter Date: 05/24/2015      End of Session - 05/25/15 0957    Visit Number 16   Number of Visits 25   Date for SLP Re-Evaluation 06/16/15   SLP Start Time 73   SLP Stop Time  1657   SLP Time Calculation (min) 57 min   Activity Tolerance Patient tolerated treatment well      Past Medical History  Diagnosis Date  . Restless leg syndrome unk  . Polysubstance abuse     Past Surgical History  Procedure Laterality Date  . Cholecystectomy    . Tubal ligation      There were no vitals filed for this visit.  Visit Diagnosis: Cognitive communication deficit      Subjective Assessment - 05/25/15 0956    Subjective The patient feels that she is working harder / better and is remembering better as well.               ADULT SLP TREATMENT - 05/25/15 0001    General Information   Behavior/Cognition Alert;Cooperative;Pleasant mood  Decreased frustration   HPI TBI   Treatment Provided   Treatment provided Cognitive-Linquistic   Pain Assessment   Pain Assessment No/denies pain   Cognitive-Linquistic Treatment   Treatment focused on Cognition   Skilled Treatment WORD FINDING:  complete simple verbal analogies with 90% accuracy independently.  Write bridge sentences given mod cues.  REASONING: write 2 plausible causes given effect with SLP cues to be specific and cues to recall exact task.   Assessment / Recommendations / Plan   Plan Continue with current plan of care   Progression Toward Goals   Progression toward goals Progressing toward goals          SLP Education - 05/25/15 0956    Education provided Yes   Education Details Need to tolerate struggle  when faced with difficult task   Person(s) Educated Patient   Methods Explanation   Comprehension Verbalized understanding            SLP Long Term Goals - 04/19/15 0950    SLP LONG TERM GOAL #1   Title Patient will demonstrate functional cognitive-communication skills for independent completion of personal responsibilities.   Time 12   Period Weeks   Status Partially Met   SLP LONG TERM GOAL #2   Title Patient will complete complex executive function skills tasks with 80% accuracy.   Time 12   Period Weeks   Status Partially Met   SLP LONG TERM GOAL #3   Title Patient will complete complex attention tasks with 80% accuracy.   Time 12   Period Weeks   Status Partially Met   SLP LONG TERM GOAL #4   Title Patient will complete memory strategy activities with 80% accuracy.   Time 12   Period Weeks   Status Partially Met          Problem List There are no active problems to display for this patient.  Leroy Sea, MS/CCC- SLP  Lou Miner 05/25/2015, 9:58 AM  [place] MAIN [facility] SERVICES [address] [place], [place], [address] Phone: [phone]   Fax:  [phone]   Name: Natasha [name] MRN:  [id number] Date of Birth: [birth year]

## 2015-05-28 ENCOUNTER — Encounter: Payer: Self-pay | Admitting: Occupational Therapy

## 2015-05-28 ENCOUNTER — Ambulatory Visit: Payer: Commercial Managed Care - HMO | Admitting: Occupational Therapy

## 2015-05-28 ENCOUNTER — Ambulatory Visit: Payer: Commercial Managed Care - HMO | Admitting: Physical Therapy

## 2015-05-28 ENCOUNTER — Ambulatory Visit: Payer: Commercial Managed Care - HMO | Admitting: Speech Pathology

## 2015-05-28 ENCOUNTER — Encounter: Payer: Self-pay | Admitting: Physical Therapy

## 2015-05-28 DIAGNOSIS — R279 Unspecified lack of coordination: Secondary | ICD-10-CM | POA: Diagnosis not present

## 2015-05-28 DIAGNOSIS — M6281 Muscle weakness (generalized): Secondary | ICD-10-CM

## 2015-05-28 DIAGNOSIS — R2681 Unsteadiness on feet: Secondary | ICD-10-CM

## 2015-05-28 DIAGNOSIS — R41841 Cognitive communication deficit: Secondary | ICD-10-CM

## 2015-05-28 NOTE — Therapy (Signed)
[place] MAIN [facility] SERVICES [address] [place], [place], [address] Phone: [phone]   Fax:  [phone]  Occupational Therapy Treatment  Patient Details  Name: Natasha [name] MRN: [id number] Date of Birth: 04-11-1986 No Data Recorded  Encounter Date: 05/28/2015    Past Medical History  Diagnosis Date  . Restless leg syndrome unk  . Polysubstance abuse     Past Surgical History  Procedure Laterality Date  . Cholecystectomy    . Tubal ligation      There were no vitals filed for this visit.  Visit Diagnosis:  Lack of coordination  Muscle weakness (generalized)  "my mom let me do laundry"  Picked up coins largest to smallest and placed in coin counter both hands. Used color coded clips from hardest to easiest for pinch on edge of box with right hand and off with the left.  Removed nut from bolt with right thumb, put back on with left. Placed 1/4 inch pegs in peg board and removed with alternating fingers. Completed twisty grip from hardest to easiest on 5 settings with 25 repititions. Completed forearm hammer rotations with supination and pronation 25 reps each. Cues for technique for activities.                                   OT Long Term Goals - 05/17/15 1524    OT LONG TERM GOAL #1   Title Patient will improve bilateral UE strength by 1 mm grade to complete IADL tasks with modified independence.     Time 12   Period Weeks   Status Partially Met   OT LONG TERM GOAL #2   Title Patient will complete laundry tasks with supervision.   Time 12   Period Weeks   Status Partially Met   OT LONG TERM GOAL #3   Title Patient will pour tea from a pitcher without spillage   Time 12   Period Weeks   Status Partially Met   OT LONG TERM GOAL #4   Title Patient will demonstrate the ability to transport snack items from the kitchen to the den without spilling items.    Time 12   Period Weeks   Status  On-going   OT LONG TERM GOAL #5   Title Patient will demonstrate the ability to choose her own clothing each day, matching 100% of the time.   Time 12   Period Weeks   Status Partially Met               Problem List There are no active problems to display for this patient.   Sharon Mt 05/28/2015, 3:16 PM  [place] MAIN [facility] SERVICES [address] [place], [place], [address] Phone: [phone]   Fax:  [phone]  Name: Natasha [name] MRN: [id number] Date of Birth: 1985-05-26

## 2015-05-28 NOTE — Therapy (Addendum)
[place] MAIN [facility] SERVICES [place], [place], [address] Phone: [phone]   Fax:  [phone]  Physical Therapy Treatment/progress note  Patient Details  Name: Natasha [name] MRN: [id number] Date of Birth: 1985-07-27 Referring Provider: Eulogio Bear  Encounter Date: 05/28/2015      PT End of Session - 05/28/15 1545    Visit Number 10   Number of Visits 25   Date for PT Re-Evaluation 07/05/15   PT Start Time 0315   PT Stop Time 0400   PT Time Calculation (min) 45 min   Equipment Utilized During Treatment Gait belt   Activity Tolerance Patient tolerated treatment well   Behavior During Therapy [facility] for tasks assessed/performed      Past Medical History  Diagnosis Date  . Restless leg syndrome unk  . Polysubstance abuse     Past Surgical History  Procedure Laterality Date  . Cholecystectomy    . Tubal ligation      There were no vitals filed for this visit.  Visit Diagnosis:  Lack of coordination  Muscle weakness (generalized)  Cognitive communication deficit  Unsteady gait      Subjective Assessment - 05/28/15 1543    Subjective Patient says that she has been doing her HEP and she did wash some colthes over the weekend.    Currently in Pain? No/denies      OUTCOME MEASURES: TEST Outcome Interpretation  5 times sit<>stand 11.39sec >60 yo, >15 sec indicates increased risk for falls  10 meter walk test  . 85 m/s <1.0 m/s indicates increased risk for falls; limited community ambulator  Timed up and Go  15.35 sec <14 sec indicates increased risk for falls  6 minute walk test  780 Feet            standing hip abd with YTB x 20  side stepping left and right in parallel bars 10 feet x 3 standing on blue foam with cone reaching x 20 across midline step ups from floor to 6 inch stool x 20 bilateral sit to stand x 10 marching in parallel bars  x 20 stepping pattern with weight shifting fwd/bwd x 10.  Patient needs occasional verbal cueing to improve posture and cueing to correctly perform exercises slowly, holding at end of range to increase motor firing of desired muscle to encourage fatigue.  G codes:  Mobility: Walking and moving around 8978 current status 40-60% CK 8979 goal status 20-40%  CJ                       PT Education - 05/28/15 1544    Education provided Yes   Education Details Plan of care and HEP   Person(s) Educated Patient   Methods Explanation   Comprehension Verbalized understanding             PT Long Term Goals - 05/28/15 1619    PT LONG TERM GOAL #1   Title Patient will reduce timed up and go to <11 seconds to reduce fall risk and demonstrate improved transfer/gait ability.   Time 12   Period Weeks   Status Partially Met   PT LONG TERM GOAL #2   Title Patient will increase six minute walk test distance to >1000 for progression to community ambulator and improve gait ability   Time 12   Period Weeks   Status Partially Met   PT LONG TERM GOAL #3   Title Patient will increase  10 meter walk test to >1.34ms as to improve gait speed for better community ambulation and to reduce fall    Time 12   Period Weeks   Status Partially Met   PT LONG TERM GOAL #4   Title Patient will tolerate 5 seconds of single leg stance without loss of balance to improve ability to get in and out of shower safely   Time 12   Period Weeks   Status Partially Met   PT LONG TERM GOAL #5   Title Patient (< 634years old) will complete five times sit to stand test in < 10 seconds indicating an increased LE strength and improved balance   Time 12   Period Weeks               Plan - 05/28/15 1546    Clinical Impression Statement Motor control of LE much improved. Muscle fatigue but no major pain complaints. Patient advancing to red theraband for exercises listed above.   Pt will benefit from  skilled therapeutic intervention in order to improve on the following deficits Abnormal gait;Decreased balance;Decreased endurance;Decreased mobility;Difficulty walking;Decreased cognition;Decreased safety awareness;Decreased strength;Impaired UE functional use;Obesity   Rehab Potential Good   PT Frequency 2x / week   PT Duration 12 weeks   PT Treatment/Interventions Therapeutic activities;Therapeutic exercise;Balance training;Neuromuscular re-education;Stair training;Gait training   PT Next Visit Plan strengthening and balance training   PT Home Exercise Plan balance in corner   Consulted and Agree with Plan of Care Patient        Problem List There are no active problems to display for this patient. KAlanson Puls PT, DPT  [place] 05/28/2015, 4:20 PM  [place] [facility] [address] [address]Phone: [phone]  Fax:  [phone] Name: Natasha SMETMRN: [id number] of Birth: [birth year]

## 2015-05-29 ENCOUNTER — Encounter: Payer: Self-pay | Admitting: Speech Pathology

## 2015-05-29 NOTE — Therapy (Signed)
[place] MAIN [facility] SERVICES [address] [place], [place], [address] Phone: [phone]   Fax:  [phone]  Speech Language Pathology Treatment  Patient Details  Name: Natasha [name] MRN: [id number] Date of Birth: 1985-10-17 Referring Provider: Dr. Janene Harvey  Encounter Date: 05/28/2015      End of Session - 05/29/15 1317    Visit Number 17   Number of Visits 25   Date for SLP Re-Evaluation 06/16/15   SLP Start Time 30   SLP Stop Time  2703   SLP Time Calculation (min) 58 min   Activity Tolerance Patient tolerated treatment well      Past Medical History  Diagnosis Date  . Restless leg syndrome unk  . Polysubstance abuse     Past Surgical History  Procedure Laterality Date  . Cholecystectomy    . Tubal ligation      There were no vitals filed for this visit.  Visit Diagnosis: Cognitive communication deficit      Subjective Assessment - 05/29/15 1317    Subjective The patient feels that she is working harder / better and is remembering better as well.   Currently in Pain? No/denies               ADULT SLP TREATMENT - 05/29/15 0001    General Information   Behavior/Cognition Alert;Cooperative;Pleasant mood  Decreased frustration   HPI TBI   Treatment Provided   Treatment provided Cognitive-Linquistic   Pain Assessment   Pain Assessment No/denies pain   Cognitive-Linquistic Treatment   Treatment focused on Cognition   Skilled Treatment WORD FINDING:  complete simple verbal analogies with 90% accuracy independently.  Write bridge sentences given mod cues.  REASONING: write 2 plausible causes given effect with SLP cues to be specific and cues to recall exact task.   Assessment / Recommendations / Plan   Plan Continue with current plan of care   Progression Toward Goals   Progression toward goals Progressing toward goals          SLP Education - 05/29/15 1317    Education provided Yes   Education  Details Word finding strategies   Person(s) Educated Patient   Methods Explanation   Comprehension Verbalized understanding            SLP Long Term Goals - 04/19/15 0950    SLP LONG TERM GOAL #1   Title Patient will demonstrate functional cognitive-communication skills for independent completion of personal responsibilities.   Time 12   Period Weeks   Status Partially Met   SLP LONG TERM GOAL #2   Title Patient will complete complex executive function skills tasks with 80% accuracy.   Time 12   Period Weeks   Status Partially Met   SLP LONG TERM GOAL #3   Title Patient will complete complex attention tasks with 80% accuracy.   Time 12   Period Weeks   Status Partially Met   SLP LONG TERM GOAL #4   Title Patient will complete memory strategy activities with 80% accuracy.   Time 12   Period Weeks   Status Partially Met          Plan - 05/29/15 1318    Clinical Impression Statement The patient is attending well to simple cognitive tasks and language tasks, but exhibits greater frustration with more difficult reasoning tasks.  She is having word retrieval problems, abstract thinking problems, and visual perceptual problems.  She demonstrates improved skills in slightly more complex activities  but has continued need for work with high level language, abstract thinking, and visual perceptual skills.   Speech Therapy Frequency 2x / week   Duration Other (comment)   Treatment/Interventions Compensatory techniques;Cognitive reorganization;Internal/external aids;Functional tasks;SLP instruction and feedback;Compensatory strategies;Patient/family education   Potential to Achieve Goals Good   Potential Considerations Ability to learn/carryover information;Cooperation/participation level;Previous level of function;Severity of impairments;Family/community support   SLP Home Exercise Plan Analogies   Consulted and Agree with Plan of Care Patient        Problem List There are no  active problems to display for this patient.  Leroy Sea, MS/CCC- SLP  Lou Miner 05/29/2015, 1:19 PM  [place] MAIN [facility] SERVICES [address] [place], [place], [address] Phone: [phone]   Fax:  [phone]   Name: Natasha [name] MRN: [id number] Date of Birth: 01/27/1986

## 2015-05-31 ENCOUNTER — Encounter: Payer: Self-pay | Admitting: Physical Therapy

## 2015-05-31 ENCOUNTER — Ambulatory Visit: Payer: Commercial Managed Care - HMO | Admitting: Speech Pathology

## 2015-05-31 ENCOUNTER — Ambulatory Visit: Payer: Commercial Managed Care - HMO | Attending: Pediatrics | Admitting: Occupational Therapy

## 2015-05-31 ENCOUNTER — Encounter: Payer: Self-pay | Admitting: Occupational Therapy

## 2015-05-31 ENCOUNTER — Ambulatory Visit: Payer: Commercial Managed Care - HMO | Admitting: Physical Therapy

## 2015-05-31 DIAGNOSIS — R279 Unspecified lack of coordination: Secondary | ICD-10-CM | POA: Diagnosis present

## 2015-05-31 DIAGNOSIS — R278 Other lack of coordination: Secondary | ICD-10-CM | POA: Insufficient documentation

## 2015-05-31 DIAGNOSIS — M6281 Muscle weakness (generalized): Secondary | ICD-10-CM | POA: Diagnosis present

## 2015-05-31 DIAGNOSIS — R41841 Cognitive communication deficit: Secondary | ICD-10-CM

## 2015-05-31 DIAGNOSIS — R4689 Other symptoms and signs involving appearance and behavior: Secondary | ICD-10-CM | POA: Insufficient documentation

## 2015-05-31 DIAGNOSIS — R531 Weakness: Secondary | ICD-10-CM | POA: Diagnosis present

## 2015-05-31 DIAGNOSIS — R2681 Unsteadiness on feet: Secondary | ICD-10-CM

## 2015-05-31 DIAGNOSIS — R5381 Other malaise: Secondary | ICD-10-CM | POA: Insufficient documentation

## 2015-05-31 DIAGNOSIS — R270 Ataxia, unspecified: Secondary | ICD-10-CM | POA: Diagnosis present

## 2015-05-31 DIAGNOSIS — Z7409 Other reduced mobility: Secondary | ICD-10-CM | POA: Insufficient documentation

## 2015-05-31 NOTE — Therapy (Signed)
[place] MAIN [facility] SERVICES [address] [place], [place], [address] Phone: [phone]   Fax:  [phone]  Physical Therapy Treatment  Patient Details  Name: Natasha [name] MRN: [id number] Date of Birth: 08-01-1985 Referring Provider: Eulogio Bear  Encounter Date: 05/31/2015      PT End of Session - 05/31/15 1523    Visit Number 11   PT Start Time 0315   PT Stop Time 0400   PT Time Calculation (min) 45 min   Activity Tolerance Patient tolerated treatment well      Past Medical History  Diagnosis Date  . Restless leg syndrome unk  . Polysubstance abuse     Past Surgical History  Procedure Laterality Date  . Cholecystectomy    . Tubal ligation      There were no vitals filed for this visit.  Visit Diagnosis:  Lack of coordination  Muscle weakness (generalized)  Cognitive communication deficit  Unsteady gait      Subjective Assessment - 05/31/15 1521    Subjective Patient says that she has been doing her HEP and is doing a little more around the house.    Currently in Pain? Yes   Pain Score 5    Pain Location Foot   Pain Orientation Right;Left    Neuromuscular Re-education   Marching in place on blue foam pad x 30 seconds for 2 sets   Sorting balls on foam, sorting shapes on foam feet together x 5 minutes each Therapeutic exercise; standing hip abd with YTB x 20  side stepping left and right in parallel bars 10 feet x 3 standing on blue foam with cone reaching x 20 across midline step ups from floor to 6 inch stool x 20 bilateral sit to stand x 10 marching in parallel bars x 20 Patient needs occasional verbal cueing to improve posture and cueing to correctly perform exercises slowly, holding at end of range to increase motor firing of desired muscle to encourage fatigue.                             PT Education - 05/31/15 1523    Education provided Yes   Education Details HEP   Person(s) Educated Patient   Methods Explanation   Comprehension Verbalized understanding             PT Long Term Goals - 05/28/15 1619    PT LONG TERM GOAL #1   Title Patient will reduce timed up and go to <11 seconds to reduce fall risk and demonstrate improved transfer/gait ability.   Time 12   Period Weeks   Status Partially Met   PT LONG TERM GOAL #2   Title Patient will increase six minute walk test distance to >1000 for progression to community ambulator and improve gait ability   Time 12   Period Weeks   Status Partially Met   PT LONG TERM GOAL #3   Title Patient will increase 10 meter walk test to >1.65ms as to improve gait speed for better community ambulation and to reduce fall    Time 12   Period Weeks   Status Partially Met   PT LONG TERM GOAL #4   Title Patient will tolerate 5 seconds of single leg stance without loss of balance to improve ability to get in and out of shower safely   Time 12   Period Weeks   Status Partially Met  PT LONG TERM GOAL #5   Title Patient (< 43 years old) will complete five times sit to stand test in < 10 seconds indicating an increased LE strength and improved balance   Time 12   Period Weeks               Plan - 05/31/15 1524    Clinical Impression Statement Patient has fatigue with standing exercises and needs constant VC to have correct posturePatient has loss of balance and needs UE support intermittently thorough out exercise.    Pt will benefit from skilled therapeutic intervention in order to improve on the following deficits Abnormal gait;Decreased balance;Decreased endurance;Decreased mobility;Difficulty walking;Decreased cognition;Decreased safety awareness;Decreased strength;Impaired UE functional use;Obesity   Rehab Potential Good   PT Frequency 2x / week   PT Duration 12 weeks   PT Treatment/Interventions Therapeutic activities;Therapeutic exercise;Balance training;Neuromuscular re-education;Stair  training;Gait training   PT Next Visit Plan strengthening and balance training   PT Home Exercise Plan balance in corner   Consulted and Agree with Plan of Care Patient        Problem List There are no active problems to display for this patient. Alanson Puls, PT, DPT  [place], [place]ette [place] S 05/31/2015, 3:46 PM  [place] MAIN [facility] SERVICES [address] [place], [place], [address] Phone: [phone]   Fax:  [phone]  Name: ENYAH MOMAN MRN: [id number] Date of Birth: 11/28/1985

## 2015-05-31 NOTE — Patient Instructions (Signed)
Instructed to continue fine motor activities at home such as picking up coins and flipping cards.
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
## 2015-06-01 ENCOUNTER — Encounter: Payer: Self-pay | Admitting: Speech Pathology

## 2015-06-01 NOTE — Therapy (Signed)
[place] MAIN [facility] SERVICES [address] [place], [place], [address] Phone: [phone]   Fax:  [phone]  Speech Language Pathology Treatment  Patient Details  Name: LANYLA COSTELLO MRN: [id number] Date of Birth: [birth year] Referring Provider: Dr. Janene Harvey  Encounter Date: 05/31/2015      End of Session - 06/01/15 1629    Visit Number 18   Number of Visits 25   Date for SLP Re-Evaluation 06/16/15   SLP Start Time 30   SLP Stop Time  1652   SLP Time Calculation (min) 52 min   Activity Tolerance Patient tolerated treatment well      Past Medical History  Diagnosis Date  . Restless leg syndrome unk  . Polysubstance abuse     Past Surgical History  Procedure Laterality Date  . Cholecystectomy    . Tubal ligation      There were no vitals filed for this visit.  Visit Diagnosis: Cognitive communication deficit      Subjective Assessment - 06/01/15 1628    Subjective The patient feels that she is working harder / better and is remembering better as well.   Currently in Pain? No/denies               ADULT SLP TREATMENT - 06/01/15 0001    General Information   Behavior/Cognition Alert;Cooperative;Pleasant mood  Decreased frustration   HPI TBI   Treatment Provided   Treatment provided Cognitive-Linquistic   Pain Assessment   Pain Assessment No/denies pain   Cognitive-Linquistic Treatment   Treatment focused on Cognition   Skilled Treatment WORD FINDING:  complete simple verbal analogies with 90% accuracy independently.  Write bridge sentences given min-mod cues.  State adjective given definition with 90% accuracy.  Identify item that does not belong in list of 5 with 90% accuracy and state reason with 70% accuracy independently and 100% given cues to be specific.  REASONING: write 2 plausible causes given effect with SLP cues to be specific and cues to recall exact task.   Assessment / Recommendations / Plan   Plan Continue with current plan of care   Progression Toward Goals   Progression toward goals Progressing toward goals          SLP Education - 06/01/15 1628    Education provided Yes   Education Details Need to tolerate struggle when faced with difficult task   Person(s) Educated Patient   Methods Explanation   Comprehension Verbalized understanding            SLP Long Term Goals - 04/19/15 0950    SLP LONG TERM GOAL #1   Title Patient will demonstrate functional cognitive-communication skills for independent completion of personal responsibilities.   Time 12   Period Weeks   Status Partially Met   SLP LONG TERM GOAL #2   Title Patient will complete complex executive function skills tasks with 80% accuracy.   Time 12   Period Weeks   Status Partially Met   SLP LONG TERM GOAL #3   Title Patient will complete complex attention tasks with 80% accuracy.   Time 12   Period Weeks   Status Partially Met   SLP LONG TERM GOAL #4   Title Patient will complete memory strategy activities with 80% accuracy.   Time 12   Period Weeks   Status Partially Met          Plan - 06/01/15 1629    Clinical Impression Statement The patient is attending  well to simple cognitive tasks and language tasks, but exhibits greater frustration with more difficult reasoning tasks.  She is having word retrieval problems, abstract thinking problems, and visual perceptual problems.  She demonstrates improved skills in slightly more complex activities but has continued need for work with high level language, abstract thinking, and visual perceptual skills.   Speech Therapy Frequency 2x / week   Duration Other (comment)   Treatment/Interventions Compensatory techniques;Cognitive reorganization;Internal/external aids;Functional tasks;SLP instruction and feedback;Compensatory strategies;Patient/family education   Potential to Achieve Goals Good   Potential Considerations Ability to learn/carryover  information;Cooperation/participation level;Previous level of function;Severity of impairments;Family/community support   SLP Home Exercise Plan Analogies        Problem List There are no active problems to display for this patient.  Leroy Sea, MS/CCC- SLP  Lou Miner 06/01/2015, 4:30 PM  [place] MAIN [facility] SERVICES [address] [place], [place], [address] Phone: [phone]   Fax:  [phone]   Name: TANISHI NAULT MRN: [id number] Date of Birth: March 20, 1986

## 2015-06-05 ENCOUNTER — Ambulatory Visit: Payer: Commercial Managed Care - HMO | Admitting: Physical Therapy

## 2015-06-05 ENCOUNTER — Ambulatory Visit: Payer: Commercial Managed Care - HMO | Admitting: Speech Pathology

## 2015-06-05 ENCOUNTER — Ambulatory Visit: Payer: Commercial Managed Care - HMO | Admitting: Occupational Therapy

## 2015-06-05 ENCOUNTER — Encounter: Payer: Self-pay | Admitting: Physical Therapy

## 2015-06-05 DIAGNOSIS — R278 Other lack of coordination: Secondary | ICD-10-CM

## 2015-06-05 DIAGNOSIS — R41841 Cognitive communication deficit: Secondary | ICD-10-CM

## 2015-06-05 DIAGNOSIS — R279 Unspecified lack of coordination: Secondary | ICD-10-CM

## 2015-06-05 DIAGNOSIS — Z7409 Other reduced mobility: Secondary | ICD-10-CM

## 2015-06-05 DIAGNOSIS — R2681 Unsteadiness on feet: Secondary | ICD-10-CM

## 2015-06-05 DIAGNOSIS — M6281 Muscle weakness (generalized): Secondary | ICD-10-CM

## 2015-06-05 NOTE — Therapy (Signed)
[place] MAIN [facility] SERVICES [place], [place], [address] Phone: [phone]   Fax:  [phone]  Physical Therapy Treatment  Patient Details  Name: Natasha [name] MRN: [id number] Date of Birth: [birth year] Referring Provider: Eulogio Bear  Encounter Date: 06/05/2015      PT End of Session - 06/05/15 1522    Visit Number 12   PT Start Time 0315   PT Stop Time 0400   PT Time Calculation (min) 45 min   Equipment Utilized During Treatment Gait belt   Activity Tolerance Patient tolerated treatment well      Past Medical History  Diagnosis Date  . Restless leg syndrome unk  . Polysubstance abuse     Past Surgical History  Procedure Laterality Date  . Cholecystectomy    . Tubal ligation      There were no vitals filed for this visit.  Visit Diagnosis:  Lack of coordination  Muscle weakness (generalized)  Unsteady gait  Impaired functional mobility, balance, gait, and endurance      Subjective Assessment - 06/05/15 1521    Subjective Patient says that she has been doing her HEP and is doing a little more around the house.    Currently in Pain? No/denies      THER-EX Nustep L3 x 5 minutes for warm-up during history (5 minutes unbilled); Quantum double leg press 105# x 10, 120# x 10, 135# x 10; Heel raises with UE support and toes on 2x4, 2 x 10; Mini squats with RTB around knees to prevent valgus 2 x 10; RTB side stepping in // bars 4 lengths x 2; Sit to stand without UE support RTB around knees to prevent valgus  NEUROMUSCULAR RE-EDUCATION Airex NBOS eyes open/closed x 30 seconds each; Airex NBOS eyes open horizontal and vertical head turns x 30 seconds; Airex cone  reaching crossing midline  Toe tapping 6 inch stool without UE assist Tandem gait in // bars x 4 laps  Side stepping on blue  foam balance beam x 5 lengths of the parallel bars  Standing on foam NBOS  sorting balls/ sorting shapes reaching   Patient continues to demonstrates less incoordination of movement with select exercises such as stepping backwards. Patient responds well to verbal and tactile cues to correct form and technique.  Motor control of LE is improving.   Muscle fatigue but no major pain complaints.                           PT Education - 06/05/15 1521    Education provided Yes   Education Details HEP   Person(s) Educated Patient   Methods Explanation   Comprehension Verbalized understanding             PT Long Term Goals - 05/28/15 1619    PT LONG TERM GOAL #1   Title Patient will reduce timed up and go to <11 seconds to reduce fall risk and demonstrate improved transfer/gait ability.   Time 12   Period Weeks   Status Partially Met   PT LONG TERM GOAL #2   Title Patient will increase six minute walk test distance to >1000 for progression to community ambulator and improve gait ability   Time 12   Period Weeks   Status Partially Met   PT LONG TERM GOAL #3   Title Patient will increase 10 meter walk test to >1.29ms as to improve gait speed for better  community ambulation and to reduce fall    Time 12   Period Weeks   Status Partially Met   PT LONG TERM GOAL #4   Title Patient will tolerate 5 seconds of single leg stance without loss of balance to improve ability to get in and out of shower safely   Time 12   Period Weeks   Status Partially Met   PT LONG TERM GOAL #5   Title Patient (< 79 years old) will complete five times sit to stand test in < 10 seconds indicating an increased LE strength and improved balance   Time 12   Period Weeks               Plan - 06/05/15 1522    Clinical Impression Statement Patient has fatigue with standing exercises and has pain in her feet during standing exercises. She needs rest periods after 3 minutes of gait trainng on the TM .   Pt will benefit from skilled therapeutic intervention in order to improve on the following  deficits Abnormal gait;Decreased balance;Decreased endurance;Decreased mobility;Difficulty walking;Decreased cognition;Decreased safety awareness;Decreased strength;Impaired UE functional use;Obesity   Rehab Potential Good   PT Frequency 2x / week   PT Duration 12 weeks   PT Treatment/Interventions Therapeutic activities;Therapeutic exercise;Balance training;Neuromuscular re-education;Stair training;Gait training   PT Next Visit Plan strengthening and balance training   PT Home Exercise Plan balance in corner   Consulted and Agree with Plan of Care Patient        Problem List There are no active problems to display for this patient.  Alanson Puls, PT, DPT [place], [place] S 06/05/2015, 3:25 PM  [place] MAIN [facility] SERVICES [address] [place], [place], [address] Phone: [phone]   Fax:  [phone]  Name: Natasha [name] MRN: [id number] Date of Birth: 11/16/1985
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
## 2015-06-05 NOTE — Patient Instructions (Signed)
Pt. was provided with a HEP/visual handout for pink theraputty exercises.

## 2015-06-06 ENCOUNTER — Encounter: Payer: Self-pay | Admitting: Speech Pathology

## 2015-06-06 NOTE — Therapy (Signed)
[place] MAIN [facility] SERVICES [address] [place], [place], [address] Phone: [phone]   Fax:  [phone]  Speech Language Pathology Treatment  Patient Details  Name: Natasha [name] MRN: [id number] Date of Birth: [birth year] Referring Provider: Dr. Janene Harvey  Encounter Date: 06/05/2015      End of Session - 06/06/15 1351    Visit Number 19   Number of Visits 25   Date for SLP Re-Evaluation 06/16/15   SLP Start Time 74   SLP Stop Time  1657   SLP Time Calculation (min) 56 min   Activity Tolerance Patient tolerated treatment well      Past Medical History  Diagnosis Date  . Restless leg syndrome unk  . Polysubstance abuse     Past Surgical History  Procedure Laterality Date  . Cholecystectomy    . Tubal ligation      There were no vitals filed for this visit.  Visit Diagnosis: Cognitive communication deficit      Subjective Assessment - 06/06/15 1349    Subjective The patient feels that she is working harder / better and is remembering better as well.   Currently in Pain? No/denies               ADULT SLP TREATMENT - 06/06/15 0001    General Information   Behavior/Cognition Alert;Cooperative;Pleasant mood  Decreased frustration   HPI TBI   Treatment Provided   Treatment provided Cognitive-Linquistic   Pain Assessment   Pain Assessment No/denies pain   Cognitive-Linquistic Treatment   Treatment focused on Cognition   Skilled Treatment WORD FINDING:  complete simple verbal analogies with 90% accuracy independently.  Write bridge sentences given min-mod cues.  State adjective given definition with 90% accuracy.  State abstract category given 3 members with 80% accuracy.  REASONING: situational logic: given situation, determine which (of 3) solutions is correct with mod cues. Solving letter/word puzzles: 70% accuracy independently and 90% given min cues.  Logic puzzle: requires min-mod cues to solve simple logic  puzzle.  Planning projects/logic: given cues, determine placement of items with 90% independently.   Assessment / Recommendations / Plan   Plan Continue with current plan of care   Progression Toward Goals   Progression toward goals Progressing toward goals          SLP Education - 06/06/15 1351    Education provided Yes   Education Details Need to tolerate struggle when faced with difficult task   Person(s) Educated Patient   Methods Explanation   Comprehension Verbalized understanding            SLP Long Term Goals - 04/19/15 0950    SLP LONG TERM GOAL #1   Title Patient will demonstrate functional cognitive-communication skills for independent completion of personal responsibilities.   Time 12   Period Weeks   Status Partially Met   SLP LONG TERM GOAL #2   Title Patient will complete complex executive function skills tasks with 80% accuracy.   Time 12   Period Weeks   Status Partially Met   SLP LONG TERM GOAL #3   Title Patient will complete complex attention tasks with 80% accuracy.   Time 12   Period Weeks   Status Partially Met   SLP LONG TERM GOAL #4   Title Patient will complete memory strategy activities with 80% accuracy.   Time 12   Period Weeks   Status Partially Met          Problem  List There are no active problems to display for this patient.  Leroy Sea, MS/CCC- SLP  Lou Miner 06/06/2015, 1:52 PM  [place] MAIN [facility] SERVICES [address] [place], [place], [address] Phone: [phone]   Fax:  [phone]   Name: Natasha [name] MRN: [id number] Date of Birth: 1985-06-15

## 2015-06-07 ENCOUNTER — Ambulatory Visit: Payer: Commercial Managed Care - HMO | Admitting: Speech Pathology

## 2015-06-07 ENCOUNTER — Encounter: Payer: Self-pay | Admitting: Physical Therapy

## 2015-06-07 ENCOUNTER — Ambulatory Visit: Payer: Commercial Managed Care - HMO | Admitting: Occupational Therapy

## 2015-06-07 ENCOUNTER — Ambulatory Visit: Payer: Commercial Managed Care - HMO | Admitting: Physical Therapy

## 2015-06-07 DIAGNOSIS — R279 Unspecified lack of coordination: Secondary | ICD-10-CM | POA: Diagnosis not present

## 2015-06-07 DIAGNOSIS — M6281 Muscle weakness (generalized): Secondary | ICD-10-CM

## 2015-06-07 DIAGNOSIS — R5381 Other malaise: Secondary | ICD-10-CM

## 2015-06-07 DIAGNOSIS — R278 Other lack of coordination: Secondary | ICD-10-CM

## 2015-06-07 DIAGNOSIS — R6889 Other general symptoms and signs: Secondary | ICD-10-CM

## 2015-06-07 DIAGNOSIS — Z7409 Other reduced mobility: Secondary | ICD-10-CM

## 2015-06-07 DIAGNOSIS — R2681 Unsteadiness on feet: Secondary | ICD-10-CM

## 2015-06-07 DIAGNOSIS — R41841 Cognitive communication deficit: Secondary | ICD-10-CM

## 2015-06-07 NOTE — Therapy (Signed)
[place] MAIN [facility] SERVICES [address] [place], [place], [address] Phone: [phone]   Fax:  [phone]  Physical Therapy Treatment  Patient Details  Name: Natasha [name] MRN: [id number] Date of Birth: Mar 14, 1986 Referring Provider: Eulogio Bear  Encounter Date: 06/07/2015      PT End of Session - 06/07/15 1451    Visit Number 13   PT Start Time 0315   PT Stop Time 0400   PT Time Calculation (min) 45 min   Equipment Utilized During Treatment Gait belt   Activity Tolerance Patient tolerated treatment well      Past Medical History  Diagnosis Date  . Restless leg syndrome unk  . Polysubstance abuse     Past Surgical History  Procedure Laterality Date  . Cholecystectomy    . Tubal ligation      There were no vitals filed for this visit.  Visit Diagnosis:  Lack of coordination  Muscle weakness (generalized)  Unsteady gait  Impaired functional mobility, balance, gait, and endurance      Subjective Assessment - 06/07/15 1450    Subjective Patient says that she has been doing her HEP and is doing a little more around the house.    Currently in Pain? No/denies     Therapeutic exercise:  Nustep L3 x 5 minutes for warm-up during history  Quantum double leg press 90 lbs Heel raises with UE support and toes on 2x4, 2 x 10; Mini squats 2 x 10; RTB side stepping in // bars 4 lengths x 2; Sit to stand without UE support RTB around knees to prevent valgus 2 x 10  heel raises with 90 lbs x 20 x 3 quantum   4 way hip RTB x 20 Side stepping on blue  foam balance beam x 5 lengths of the parallel bars  4 square fwd/bwd, side to side stepping/ diagonal stepping Standing on foam NBOS  sorting balls/ sorting shapes reaching  Patient needs occasional verbal cueing to improve posture and cueing to correctly perform exercises slowly, holding at end of range to increase motor firing of desired muscle to encourage fatigue.                             PT Education - 06/07/15 1450    Education provided Yes   Education Details HEP   Person(s) Educated Patient   Methods Explanation   Comprehension Verbalized understanding             PT Long Term Goals - 05/28/15 1619    PT LONG TERM GOAL #1   Title Patient will reduce timed up and go to <11 seconds to reduce fall risk and demonstrate improved transfer/gait ability.   Time 12   Period Weeks   Status Partially Met   PT LONG TERM GOAL #2   Title Patient will increase six minute walk test distance to >1000 for progression to community ambulator and improve gait ability   Time 12   Period Weeks   Status Partially Met   PT LONG TERM GOAL #3   Title Patient will increase 10 meter walk test to >1.22ms as to improve gait speed for better community ambulation and to reduce fall    Time 12   Period Weeks   Status Partially Met   PT LONG TERM GOAL #4   Title Patient will tolerate 5 seconds of single leg stance without loss of balance to  improve ability to get in and out of shower safely   Time 12   Period Weeks   Status Partially Met   PT LONG TERM GOAL #5   Title Patient (< 38 years old) will complete five times sit to stand test in < 10 seconds indicating an increased LE strength and improved balance   Time 12   Period Weeks               Plan - 06/07/15 1452    Clinical Impression Statement PT provided min - moderate verbal instruction to improve set up, proper use of LE, and improved posture and gait mechanics. Patient responded moderately to instruction   Pt will benefit from skilled therapeutic intervention in order to improve on the following deficits Abnormal gait;Decreased balance;Decreased endurance;Decreased mobility;Difficulty walking;Decreased cognition;Decreased safety awareness;Decreased strength;Impaired UE functional use;Obesity   Rehab Potential Good   PT Frequency 2x / week   PT Duration 12 weeks   PT  Treatment/Interventions Therapeutic activities;Therapeutic exercise;Balance training;Neuromuscular re-education;Stair training;Gait training   PT Next Visit Plan strengthening and balance training   PT Home Exercise Plan balance in corner   Consulted and Agree with Plan of Care Patient        Problem List There are no active problems to display for this patient.  Alanson Puls, PT, DPT [place], [place] S 06/07/2015, 2:56 PM  [place] MAIN [facility] SERVICES [address] [place], [place], [address] Phone: [phone]   Fax:  [phone]  Name: Natasha [name] MRN: [id number] Date of Birth: 05/12/1985

## 2015-06-08 ENCOUNTER — Encounter: Payer: Self-pay | Admitting: Speech Pathology
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
## 2015-06-08 NOTE — Therapy (Signed)
[place] [facility] MAIN [facility] SERVICES [address] [place], [place], [address] Phone: [phone]   Fax:  [phone]  Speech Language Pathology Treatment  Patient Details  Name: Natasha [name] MRN: [id number] Date of Birth: 1985/10/29 Referring Provider: Dr. Richardine Service  Encounter Date: 06/07/2015      End of Session - 06/08/15 0937    Visit Number 20   Number of Visits 25   Date for SLP Re-Evaluation 06/16/15   SLP Start Time 1400   SLP Stop Time  1500   SLP Time Calculation (min) 60 min   Activity Tolerance Patient tolerated treatment well      Past Medical History  Diagnosis Date  . Restless leg syndrome unk  . Polysubstance abuse     Past Surgical History  Procedure Laterality Date  . Cholecystectomy    . Tubal ligation      There were no vitals filed for this visit.  Visit Diagnosis: Cognitive communication deficit      Subjective Assessment - 06/08/15 0931    Subjective Patient reports she is thinking more clearly and feels she is making progress with her reasoning skills.   Currently in Pain? No/denies               ADULT SLP TREATMENT - 06/08/15 0001    General Information   Behavior/Cognition Alert;Cooperative;Pleasant mood  Decreased frustration   HPI TBI   Treatment Provided   Treatment provided Cognitive-Linquistic   Pain Assessment   Pain Assessment No/denies pain   Cognitive-Linquistic Treatment   Treatment focused on Cognition   Skilled Treatment Patient successfully identified key components involved in placing a phone call to vocational rehabilitation with 100% accuracy with maximum support from the clinician. Patient was 85% accurate for solving verbal analogy problems that probed for analogy type and match with moderate support from clinician.    Assessment / Recommendations / Plan   Plan Continue with current plan of care   Progression Toward Goals   Progression toward goals Progressing toward  goals          SLP Education - 06/08/15 0931    Education provided Yes   Education Details Patient was given instruction on how to properly identify verbal analogy type. Patient was given instructions regarding making contact with vocational rehabilitation   Person(s) Educated Patient   Methods Explanation   Comprehension Verbalized understanding;Returned demonstration            SLP Long Term Goals - 04/19/15 0950    SLP LONG TERM GOAL #1   Title Patient will demonstrate functional cognitive-communication skills for independent completion of personal responsibilities.   Time 12   Period Weeks   Status Partially Met   SLP LONG TERM GOAL #2   Title Patient will complete complex executive function skills tasks with 80% accuracy.   Time 12   Period Weeks   Status Partially Met   SLP LONG TERM GOAL #3   Title Patient will complete complex attention tasks with 80% accuracy.   Time 12   Period Weeks   Status Partially Met   SLP LONG TERM GOAL #4   Title Patient will complete memory strategy activities with 80% accuracy.   Time 12   Period Weeks   Status Partially Met          Plan - 06/08/15 8636    Clinical Impression Statement Patient continues to make progress with her word finding and reasoning abilities. She feels she is prepared  to make initial contact with vocational rehab to begin the process of finding employment. Patient will return next session with information she has obtained to continue to discuss plans for moving forward. Reasoning skills continue to improve as evidenced by her ability to identify the appropriate verbal analogy type and match.   Speech Therapy Frequency 2x / week   Duration Other (comment)   Treatment/Interventions Compensatory techniques;Cognitive reorganization;Internal/external aids;Functional tasks;SLP instruction and feedback;Compensatory strategies;Patient/family education   Potential to Achieve Goals Good   Potential Considerations  Ability to learn/carryover information;Cooperation/participation level;Previous level of function;Severity of impairments;Family/community support   SLP Home Exercise Plan Analogies   Consulted and Agree with Plan of Care Patient        Problem List There are no active problems to display for this patient.   Wynelle Cleveland 06/08/2015, 9:39 AM  [place] MAIN [facility] SERVICES [address] [place], [place], [address] Phone: [phone]   Fax:  [phone]   Name: Natasha [name] MRN: [id number] Date of Birth: 1985/12/21

## 2015-06-12 ENCOUNTER — Ambulatory Visit: Payer: Commercial Managed Care - HMO | Admitting: Speech Pathology

## 2015-06-12 ENCOUNTER — Ambulatory Visit: Payer: Commercial Managed Care - HMO | Admitting: Physical Therapy

## 2015-06-12 ENCOUNTER — Encounter: Payer: Self-pay | Admitting: Physical Therapy

## 2015-06-12 ENCOUNTER — Ambulatory Visit: Payer: Commercial Managed Care - HMO | Admitting: Occupational Therapy

## 2015-06-12 DIAGNOSIS — R41841 Cognitive communication deficit: Secondary | ICD-10-CM

## 2015-06-12 DIAGNOSIS — Z7409 Other reduced mobility: Secondary | ICD-10-CM

## 2015-06-12 DIAGNOSIS — R279 Unspecified lack of coordination: Secondary | ICD-10-CM

## 2015-06-12 DIAGNOSIS — R531 Weakness: Secondary | ICD-10-CM

## 2015-06-12 DIAGNOSIS — R2681 Unsteadiness on feet: Secondary | ICD-10-CM

## 2015-06-12 DIAGNOSIS — M6281 Muscle weakness (generalized): Secondary | ICD-10-CM

## 2015-06-12 NOTE — Therapy (Signed)
[place] MAIN [facility] SERVICES [place], [place], [address] Phone: [phone]   Fax:  [phone]  Physical Therapy Treatment  Patient Details  Name: Natasha [name] MRN: [id number] Date of Birth: 1986/02/09 Referring Provider: Eulogio Bear  Encounter Date: 06/12/2015      PT End of Session - 06/12/15 1728    Visit Number 14   PT Start Time 0500   PT Stop Time 0545   PT Time Calculation (min) 45 min   Equipment Utilized During Treatment Gait belt   Activity Tolerance Patient tolerated treatment well      Past Medical History  Diagnosis Date  . Restless leg syndrome unk  . Polysubstance abuse     Past Surgical History  Procedure Laterality Date  . Cholecystectomy    . Tubal ligation      There were no vitals filed for this visit.  Visit Diagnosis:  Lack of coordination  Muscle weakness (generalized)  Unsteady gait  Impaired functional mobility, balance, gait, and endurance      Subjective Assessment - 06/12/15 1727    Subjective Patient says that she has been doing her HEP and is doing a little more around the house.    Currently in Pain? No/denies      THER-EX Nustep L3 x 5 minutes for warm-up   Quantum double leg press 100 lbs x 20 x 3 Heel raises with UE support and toes on 2x4, 2 x 10; Mini squats with RTB around knees to prevent valgus 2 x 10; RTB side stepping in // bars 4 lengths x 2; Sit to stand without UE support RTB around knees to prevent valgus 2 x 10 Gait training with spc 1000 feet x 3 with slow gait speed and CGA Patient needs occasional verbal cueing to improve posture and cueing to correctly perform exercises slowly, holding at end of range to increase motor firing of desired muscle to encourage fatigue.                            PT Education - 06/12/15 1727    Education provided Yes   Education Details HEP review   Person(s) Educated Patient   Methods  Explanation   Comprehension Verbalized understanding             PT Long Term Goals - 05/28/15 1619    PT LONG TERM GOAL #1   Title Patient will reduce timed up and go to <11 seconds to reduce fall risk and demonstrate improved transfer/gait ability.   Time 12   Period Weeks   Status Partially Met   PT LONG TERM GOAL #2   Title Patient will increase six minute walk test distance to >1000 for progression to community ambulator and improve gait ability   Time 12   Period Weeks   Status Partially Met   PT LONG TERM GOAL #3   Title Patient will increase 10 meter walk test to >1.57ms as to improve gait speed for better community ambulation and to reduce fall    Time 12   Period Weeks   Status Partially Met   PT LONG TERM GOAL #4   Title Patient will tolerate 5 seconds of single leg stance without loss of balance to improve ability to get in and out of shower safely   Time 12   Period Weeks   Status Partially Met   PT LONG TERM GOAL #5  Title Patient (< 25 years old) will complete five times sit to stand test in < 10 seconds indicating an increased LE strength and improved balance   Time 12   Period Weeks               Plan - 06/12/15 1728    Clinical Impression Statement  Fatigue with sit to stand but demonstrating more control, Increase weight for standing exercises. Fatigue still evident with cross trainer and endurance.    Pt will benefit from skilled therapeutic intervention in order to improve on the following deficits Abnormal gait;Decreased balance;Decreased endurance;Decreased mobility;Difficulty walking;Decreased cognition;Decreased safety awareness;Decreased strength;Impaired UE functional use;Obesity   Rehab Potential Good   PT Frequency 2x / week   PT Duration 12 weeks   PT Treatment/Interventions Therapeutic activities;Therapeutic exercise;Balance training;Neuromuscular re-education;Stair training;Gait training   PT Next Visit Plan strengthening and  balance training   PT Home Exercise Plan balance in corner   Consulted and Agree with Plan of Care Patient        Problem List There are no active problems to display for this patient. Alanson Puls, PT, DPT  [place], [place] S 06/12/2015, 5:31 PM  [place] MAIN [facility] SERVICES [address] [place], [place], [address] Phone: [phone]   Fax:  [phone]  Name: Natasha [name] MRN: [id number] Date of Birth: 06/15/1985

## 2015-06-13 ENCOUNTER — Encounter: Payer: Self-pay | Admitting: Speech Pathology

## 2015-06-13 NOTE — Therapy (Signed)
[place] MAIN [facility] SERVICES [address] [place], [place], [address] Phone: [phone]   Fax:  [phone]  Speech Language Pathology Treatment  Patient Details  Name: Natasha [name] MRN: [id number] Date of Birth: 06-04-1985 Referring Provider: Dr. Janene Harvey  Encounter Date: 06/12/2015      End of Session - 06/13/15 1243    Visit Number 21   Number of Visits 25   Date for SLP Re-Evaluation 06/16/15   SLP Start Time 76   SLP Stop Time  1400   SLP Time Calculation (min) 60 min   Activity Tolerance Patient tolerated treatment well      Past Medical History  Diagnosis Date  . Restless leg syndrome unk  . Polysubstance abuse     Past Surgical History  Procedure Laterality Date  . Cholecystectomy    . Tubal ligation      There were no vitals filed for this visit.  Visit Diagnosis: Cognitive communication deficit      Subjective Assessment - 06/13/15 1242    Subjective Patient reports she continues to improve with her reasoning and cognitive abilities.   Currently in Pain? No/denies               ADULT SLP TREATMENT - 06/13/15 0001    General Information   Behavior/Cognition Alert;Cooperative;Pleasant mood  Decreased frustration   HPI TBI   Treatment Provided   Treatment provided Cognitive-Linquistic   Pain Assessment   Pain Assessment No/denies pain   Cognitive-Linquistic Treatment   Treatment focused on Cognition   Skilled Treatment Patient successfully wrote a script for placing a phone call to vocational rehabilitation with 100% accuracy with maximum support from the clinician. Patient was 85% accurate for solving verbal analogy problems that probed for analogy type and match with moderate support from clinician. Patient was 100% accurate for solving logical reasoning problems with min cues.   Assessment / Recommendations / Plan   Plan Continue with current plan of care   Progression Toward Goals   Progression toward goals Progressing toward goals          SLP Education - 06/13/15 1242    Education provided Yes   Education Details information about how to contact vocational rehab   Person(s) Educated Patient   Methods Explanation   Comprehension Verbalized understanding            SLP Long Term Goals - 04/19/15 0950    SLP LONG TERM GOAL #1   Title Patient will demonstrate functional cognitive-communication skills for independent completion of personal responsibilities.   Time 12   Period Weeks   Status Partially Met   SLP LONG TERM GOAL #2   Title Patient will complete complex executive function skills tasks with 80% accuracy.   Time 12   Period Weeks   Status Partially Met   SLP LONG TERM GOAL #3   Title Patient will complete complex attention tasks with 80% accuracy.   Time 12   Period Weeks   Status Partially Met   SLP LONG TERM GOAL #4   Title Patient will complete memory strategy activities with 80% accuracy.   Time 12   Period Weeks   Status Partially Met          Plan - 06/13/15 1243    Clinical Impression Statement Patient continues to make progress with her cognitive and reasoning abilities. She plans to make initial contact with vocational rehab to begin the process of finding employment. Patient will  return next session with information she has obtained to continue to discuss plans for moving forward. Reasoning skills continue to improve as evidenced by her accuracy during logical thinking exercises.   Speech Therapy Frequency 2x / week   Duration Other (comment)   Treatment/Interventions Compensatory techniques;Cognitive reorganization;Internal/external aids;Functional tasks;SLP instruction and feedback;Compensatory strategies;Patient/family education   Potential to Achieve Goals Good   Potential Considerations Ability to learn/carryover information;Cooperation/participation level;Previous level of function;Severity of impairments;Family/community  support   SLP Home Exercise Plan vocational rehab phone call   Consulted and Agree with Plan of Care Patient        Problem List There are no active problems to display for this patient.   Wynelle Cleveland 06/13/2015, 12:44 PM  [place] MAIN [facility] SERVICES [address] [place], [place], [address] Phone: [phone]   Fax:  [phone]   Name: Natasha [name] MRN: [id number] Date of Birth: [birth year]
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
## 2015-06-14 ENCOUNTER — Ambulatory Visit: Payer: Commercial Managed Care - HMO | Admitting: Occupational Therapy

## 2015-06-14 ENCOUNTER — Ambulatory Visit: Payer: Commercial Managed Care - HMO | Admitting: Speech Pathology

## 2015-06-14 DIAGNOSIS — R41841 Cognitive communication deficit: Secondary | ICD-10-CM

## 2015-06-14 DIAGNOSIS — R279 Unspecified lack of coordination: Secondary | ICD-10-CM

## 2015-06-14 DIAGNOSIS — R531 Weakness: Secondary | ICD-10-CM
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
## 2015-06-15 ENCOUNTER — Encounter: Payer: Self-pay | Admitting: Speech Pathology

## 2015-06-15 NOTE — Therapy (Signed)
[place] MAIN [facility] SERVICES [place], [place], [address] Phone: [phone]   Fax:  [phone]  Speech Language Pathology Treatment  Patient Details  Name: Natasha [name] MRN: [id number] Date of Birth: 10-06-1985 Referring Provider: Dr. Janene Harvey  Encounter Date: 06/14/2015      End of Session - 06/15/15 1240    Visit Number 22   Number of Visits 25   Date for SLP Re-Evaluation 06/16/15   SLP Start Time 52   SLP Stop Time  1500   SLP Time Calculation (min) 60 min   Activity Tolerance Patient tolerated treatment well      Past Medical History  Diagnosis Date  . Restless leg syndrome unk  . Polysubstance abuse     Past Surgical History  Procedure Laterality Date  . Cholecystectomy    . Tubal ligation      There were no vitals filed for this visit.  Visit Diagnosis: Cognitive communication deficit      Subjective Assessment - 06/15/15 1239    Subjective Patient reports she continues to improve with her reasoning and cognitive abilities. She returns with a report that she has scheduled an appointment with vocational rehab to discuss how they may be able to help her find employment.   Currently in Pain? No/denies               ADULT SLP TREATMENT - 06/15/15 0001    General Information   Behavior/Cognition Alert;Cooperative;Pleasant mood  Decreased frustration   HPI TBI   Treatment Provided   Treatment provided Cognitive-Linquistic   Pain Assessment   Pain Assessment No/denies pain   Cognitive-Linquistic Treatment   Treatment focused on Cognition   Skilled Treatment Patient was 90% accurate for solving verbal analogy problems that probed for analogy type and match with moderate support from clinician. Patient was 100% accurate for solving logical reasoning problems with min cues. Patient was 100% accurate for identifying relevant components of potential areas of employment with maximum support from  clinician.   Assessment / Recommendations / Plan   Plan Continue with current plan of care   Progression Toward Goals   Progression toward goals Progressing toward goals          SLP Education - 06/15/15 1239    Education provided Yes   Education Details information about relevant concepts related to potential areas of employment   Person(s) Educated Patient   Methods Explanation   Comprehension Verbalized understanding            SLP Long Term Goals - 04/19/15 0950    SLP LONG TERM GOAL #1   Title Patient will demonstrate functional cognitive-communication skills for independent completion of personal responsibilities.   Time 12   Period Weeks   Status Partially Met   SLP LONG TERM GOAL #2   Title Patient will complete complex executive function skills tasks with 80% accuracy.   Time 12   Period Weeks   Status Partially Met   SLP LONG TERM GOAL #3   Title Patient will complete complex attention tasks with 80% accuracy.   Time 12   Period Weeks   Status Partially Met   SLP LONG TERM GOAL #4   Title Patient will complete memory strategy activities with 80% accuracy.   Time 12   Period Weeks   Status Partially Met          Plan - 06/15/15 1241    Clinical Impression Statement Patient continues to  make progress with her cognitive and reasoning abilities. She made initial contact with vocational rehab over the phone and is scheduled to meet with them on June 27, 2015 to discuss the prospect of employment. Reasoning skills continue to improve as evidenced by her accuracy during logical thinking exercises.   Speech Therapy Frequency 2x / week   Duration Other (comment)   Treatment/Interventions Compensatory techniques;Cognitive reorganization;Internal/external aids;Functional tasks;SLP instruction and feedback;Compensatory strategies;Patient/family education   Potential to Achieve Goals Good   Potential Considerations Ability to learn/carryover  information;Cooperation/participation level;Previous level of function;Severity of impairments;Family/community support   SLP Home Exercise Plan logical reasoning problems   Consulted and Agree with Plan of Care Patient        Problem List There are no active problems to display for this patient.   Wynelle Cleveland 06/15/2015, 12:42 PM  [place] MAIN [facility] SERVICES [address] [place], [place], [address] Phone: [phone]   Fax:  [phone]   Name: FLORINDA TAFLINGER MRN: [id number] Date of Birth: Aug 22, 1985

## 2015-06-19 ENCOUNTER — Ambulatory Visit: Payer: Commercial Managed Care - HMO | Admitting: Physical Therapy

## 2015-06-19 ENCOUNTER — Encounter: Payer: Self-pay | Admitting: Physical Therapy

## 2015-06-19 ENCOUNTER — Ambulatory Visit: Payer: Commercial Managed Care - HMO | Admitting: Occupational Therapy

## 2015-06-19 ENCOUNTER — Encounter: Payer: Self-pay | Admitting: Speech Pathology

## 2015-06-19 ENCOUNTER — Ambulatory Visit: Payer: Commercial Managed Care - HMO | Admitting: Speech Pathology

## 2015-06-19 DIAGNOSIS — R41841 Cognitive communication deficit: Secondary | ICD-10-CM

## 2015-06-19 DIAGNOSIS — R531 Weakness: Secondary | ICD-10-CM

## 2015-06-19 DIAGNOSIS — R4689 Other symptoms and signs involving appearance and behavior: Secondary | ICD-10-CM

## 2015-06-19 DIAGNOSIS — Z789 Other specified health status: Secondary | ICD-10-CM

## 2015-06-19 DIAGNOSIS — R279 Unspecified lack of coordination: Secondary | ICD-10-CM | POA: Diagnosis not present

## 2015-06-19 DIAGNOSIS — Z7409 Other reduced mobility: Secondary | ICD-10-CM

## 2015-06-19 DIAGNOSIS — R2681 Unsteadiness on feet: Secondary | ICD-10-CM

## 2015-06-19 NOTE — Therapy (Signed)
[place] MAIN [facility] SERVICES [address] [place], [place], [address] Phone: [phone]   Fax:  [phone]  Physical Therapy Treatment  Patient Details  Name: FAIGE SEELY MRN: [id number] Date of Birth: 12-24-1985 Referring Provider: Eulogio Bear  Encounter Date: 06/19/2015      PT End of Session - 06/19/15 1608    Visit Number 15   Number of Visits 25   PT Start Time 0350   PT Stop Time 0430   PT Time Calculation (min) 40 min   Equipment Utilized During Treatment Gait belt   Activity Tolerance Patient tolerated treatment well      Past Medical History  Diagnosis Date  . Restless leg syndrome unk  . Polysubstance abuse     Past Surgical History  Procedure Laterality Date  . Cholecystectomy    . Tubal ligation      There were no vitals filed for this visit.  Visit Diagnosis:  Lack of coordination  Weakness generalized  Unsteady gait  Impaired functional mobility, balance, gait, and endurance      Subjective Assessment - 06/19/15 1607    Subjective Patient says that she has been doing her HEP and is visiting with her children.       Neuromuscular Re-education   Marching in place on blue foam pad x 30 seconds for 2 sets   Tandem standing in // bars  x 1 minute Step ups to blue foam pad x 10 bilaterally for 2 sets bilaterally   Heel raises bilateral feet  Sit to stand training x5 repetitions for 3 set Balance beam tandem with UE support  Feet together on blue foam and holding ball x 1 minute, holding ball and fwd and bwd movement elbow flex/ext x 20, horizontal abd/add with ball and arms extended. Toe taps on AIREX + step 3x10 no Ue Fwd step up onto 6inch step from AIREX no UE Fwd step up onto 4inch step no UE x 10 each side CGA to min A needed cues for safety Heel raises with UE support and toes on 2x4, 2 x 10; Mini squats with RTB around knees to prevent valgus 2 x 10; RTB side stepping in // bars 4  lengths x 2; Sit to stand without UE                            PT Education - 06/19/15 1608    Education provided Yes   Education Details safety with mobility   Person(s) Educated Patient   Methods Explanation   Comprehension Verbalized understanding             PT Long Term Goals - 05/28/15 1619    PT LONG TERM GOAL #1   Title Patient will reduce timed up and go to <11 seconds to reduce fall risk and demonstrate improved transfer/gait ability.   Time 12   Period Weeks   Status Partially Met   PT LONG TERM GOAL #2   Title Patient will increase six minute walk test distance to >1000 for progression to community ambulator and improve gait ability   Time 12   Period Weeks   Status Partially Met   PT LONG TERM GOAL #3   Title Patient will increase 10 meter walk test to >1.23ms as to improve gait speed for better community ambulation and to reduce fall    Time 12   Period Weeks   Status Partially Met  PT LONG TERM GOAL #4   Title Patient will tolerate 5 seconds of single leg stance without loss of balance to improve ability to get in and out of shower safely   Time 12   Period Weeks   Status Partially Met   PT LONG TERM GOAL #5   Title Patient (< 98 years old) will complete five times sit to stand test in < 10 seconds indicating an increased LE strength and improved balance   Time 12   Period Weeks               Plan - 06/19/15 1609    Clinical Impression Statement  Patient tolerates exercises without a rest period and has  reports of pain/fatigue in her feet.   Pt will benefit from skilled therapeutic intervention in order to improve on the following deficits Abnormal gait;Decreased balance;Decreased endurance;Decreased mobility;Difficulty walking;Decreased cognition;Decreased safety awareness;Decreased strength;Impaired UE functional use;Obesity   Rehab Potential Good   PT Frequency 2x / week   PT Duration 12 weeks   PT  Treatment/Interventions Therapeutic activities;Therapeutic exercise;Balance training;Neuromuscular re-education;Stair training;Gait training   PT Next Visit Plan strengthening and balance training   PT Home Exercise Plan balance in corner   Consulted and Agree with Plan of Care Patient        Problem List There are no active problems to display for this patient. Alanson Puls, PT, DPT  [place], [place] 06/19/2015, 4:18 PM  [place] MAIN [facility] SERVICES [address] [place], [place], [address] Phone: [phone]   Fax:  [phone]  Name: KHRISTIE SAK MRN: [id number] Date of Birth: 1986/02/10

## 2015-06-19 NOTE — Therapy (Signed)
[place] [facility] MAIN [facility] SERVICES [address] [place], [place], [address] Phone: [phone]   Fax:  [phone]  Speech Language Pathology Treatment  Patient Details  Name: Natasha [name] MRN: [id number] Date of Birth: 10-10-1985 Referring Provider: Dr. Richardine Service  Encounter Date: 06/19/2015      End of Session - 06/19/15 1549    Visit Number 23   Number of Visits 25   Date for SLP Re-Evaluation 06/16/15   SLP Start Time 1400   SLP Stop Time  1500   SLP Time Calculation (min) 60 min   Activity Tolerance Patient tolerated treatment well      Past Medical History  Diagnosis Date  . Restless leg syndrome unk  . Polysubstance abuse     Past Surgical History  Procedure Laterality Date  . Cholecystectomy    . Tubal ligation      There were no vitals filed for this visit.  Visit Diagnosis: Cognitive communication deficit      Subjective Assessment - 06/19/15 1548    Subjective Patient returns with reports that she is thinking clearly and feels she is prepared to attend a meeting with vocational rehab to gather information about employment.   Currently in Pain? No/denies               ADULT SLP TREATMENT - 06/19/15 0001    General Information   Behavior/Cognition Alert;Cooperative;Pleasant mood  Decreased frustration   HPI TBI   Treatment Provided   Treatment provided Cognitive-Linquistic   Pain Assessment   Pain Assessment No/denies pain   Cognitive-Linquistic Treatment   Treatment focused on Cognition   Skilled Treatment Patient was 90% accurate for completing logic puzzles with moderate support from the clinician. Patient was 100% accurate for identifying appropriate cause and effect relationships with reasoning problems given moderate support from clinician. Patient was 100% accurate for identifying the components of a job description given min support from clinician.   Assessment / Recommendations / Plan   Plan  Continue with current plan of care   Progression Toward Goals   Progression toward goals Progressing toward goals          SLP Education - 06/19/15 1548    Education provided Yes   Education Details information about relevant concepts related to potential areas of employment   Person(s) Educated Patient   Methods Explanation   Comprehension Verbalized understanding            SLP Long Term Goals - 04/19/15 0950    SLP LONG TERM GOAL #1   Title Patient will demonstrate functional cognitive-communication skills for independent completion of personal responsibilities.   Time 12   Period Weeks   Status Partially Met   SLP LONG TERM GOAL #2   Title Patient will complete complex executive function skills tasks with 80% accuracy.   Time 12   Period Weeks   Status Partially Met   SLP LONG TERM GOAL #3   Title Patient will complete complex attention tasks with 80% accuracy.   Time 12   Period Weeks   Status Partially Met   SLP LONG TERM GOAL #4   Title Patient will complete memory strategy activities with 80% accuracy.   Time 12   Period Weeks   Status Partially Met          Plan - 06/19/15 1549    Clinical Impression Statement Patient continues to show improvement on cognitive thinking abilities as evidenced by her performance on logic  puzzles, reasoning problems, and understanding of components of job descriptions. 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   Speech Therapy Frequency 2x / week   Duration Other (comment)   Treatment/Interventions Compensatory techniques;Cognitive reorganization;Internal/external aids;Functional tasks;SLP instruction and feedback;Compensatory strategies;Patient/family education   Potential to Achieve Goals Good   Potential Considerations Ability to learn/carryover information;Cooperation/participation level;Previous level of function;Severity of  impairments;Family/community support   SLP Home Exercise Plan logical reasoning problems   Consulted and Agree with Plan of Care Patient        Problem List There are no active problems to display for this patient.   Wynelle Cleveland 06/19/2015, 3:51 PM  [place] MAIN [facility] SERVICES [address] [place], [place], [address] Phone: [phone]   Fax:  [phone]   Name: LORENE KLIMAS MRN: [id number] Date of Birth: 02-Jul-1985
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
## 2015-06-21 ENCOUNTER — Ambulatory Visit: Payer: Commercial Managed Care - HMO | Admitting: Speech Pathology

## 2015-06-21 ENCOUNTER — Encounter: Payer: Self-pay | Admitting: Physical Therapy

## 2015-06-21 ENCOUNTER — Ambulatory Visit: Payer: Commercial Managed Care - HMO | Admitting: Physical Therapy

## 2015-06-21 ENCOUNTER — Ambulatory Visit: Payer: Commercial Managed Care - HMO | Admitting: Occupational Therapy

## 2015-06-21 DIAGNOSIS — R531 Weakness: Secondary | ICD-10-CM

## 2015-06-21 DIAGNOSIS — R4689 Other symptoms and signs involving appearance and behavior: Secondary | ICD-10-CM

## 2015-06-21 DIAGNOSIS — R41841 Cognitive communication deficit: Secondary | ICD-10-CM

## 2015-06-21 DIAGNOSIS — R279 Unspecified lack of coordination: Secondary | ICD-10-CM | POA: Diagnosis not present

## 2015-06-21 DIAGNOSIS — Z789 Other specified health status: Secondary | ICD-10-CM

## 2015-06-21 DIAGNOSIS — R2681 Unsteadiness on feet: Secondary | ICD-10-CM
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
## 2015-06-21 NOTE — Therapy (Signed)
[place] MAIN [facility] SERVICES [place], [place], [address] Phone: [phone]   Fax:  [phone]  Physical Therapy Treatment  Patient Details  Name: DARRELL LEONHARDT MRN: [id number] Date of Birth: 20-Jul-1985 Referring Provider: Eulogio Bear  Encounter Date: 06/21/2015      PT End of Session - 06/21/15 1555    Visit Number 16   Number of Visits 25   Date for PT Re-Evaluation 07/05/15   PT Start Time 0345   PT Stop Time 0430   PT Time Calculation (min) 45 min   Equipment Utilized During Treatment Gait belt   Activity Tolerance Patient tolerated treatment well      Past Medical History  Diagnosis Date  . Restless leg syndrome unk  . Polysubstance abuse     Past Surgical History  Procedure Laterality Date  . Cholecystectomy    . Tubal ligation      There were no vitals filed for this visit.  Visit Diagnosis:  Weakness generalized  Unsteady gait      Subjective Assessment - 06/21/15 1555    Subjective Patient says that she has been doing her HEP and is visiting with her children.       THER-EX Standing exercises with RTB BLE : Marching 2 x 10; SLR 2 x 10; Abduction 2 x 10; Extension 2 x 10; Knee flexion 2 x 10; Heel raises 2 x 10; Eccentric step downs x 10 BLE Squats x 10 with 5 sec hold Heel raises x 10 x 2  Resisted side-steeping RTB 4 lengths x 2; Standing mini squats 2 x 10 with RTB around knees to encourage abduction; Sit to stand without UE support 2 x 10; Step-ups to 6" step x 10 bilateral; Gait training: Loft strand crutches x 1000 feet with 4 step gait pattern and CGA No reports of pain and no loss of balance.                            PT Education - 06/21/15 1555    Education provided Yes   Education Details strengthening   Person(s) Educated Patient   Methods Explanation   Comprehension Verbalized understanding             PT Long Term Goals -  05/28/15 1619    PT LONG TERM GOAL #1   Title Patient will reduce timed up and go to <11 seconds to reduce fall risk and demonstrate improved transfer/gait ability.   Time 12   Period Weeks   Status Partially Met   PT LONG TERM GOAL #2   Title Patient will increase six minute walk test distance to >1000 for progression to community ambulator and improve gait ability   Time 12   Period Weeks   Status Partially Met   PT LONG TERM GOAL #3   Title Patient will increase 10 meter walk test to >1.52ms as to improve gait speed for better community ambulation and to reduce fall    Time 12   Period Weeks   Status Partially Met   PT LONG TERM GOAL #4   Title Patient will tolerate 5 seconds of single leg stance without loss of balance to improve ability to get in and out of shower safely   Time 12   Period Weeks   Status Partially Met   PT LONG TERM GOAL #5   Title Patient (< 668years old) will complete five  times sit to stand test in < 10 seconds indicating an increased LE strength and improved balance   Time 12   Period Weeks               Plan - 06/21/15 1556    Pt will benefit from skilled therapeutic intervention in order to improve on the following deficits (p) Abnormal gait;Decreased balance;Decreased endurance;Decreased mobility;Difficulty walking;Decreased cognition;Decreased safety awareness;Decreased strength;Impaired UE functional use;Obesity   Rehab Potential (p) Good   PT Frequency (p) 2x / week   PT Duration (p) 12 weeks   PT Treatment/Interventions (p) Therapeutic activities;Therapeutic exercise;Balance training;Neuromuscular re-education;Stair training;Gait training   PT Next Visit Plan (p) strengthening and balance training   PT Home Exercise Plan (p) balance in corner   Consulted and Agree with Plan of Care (p) Patient        Problem List There are no active problems to display for this patient.  Alanson Puls, PT, DPT [place], [place]  S 06/21/2015, 4:28 PM  [place] MAIN [facility] SERVICES [address] [place], [place], [address] Phone: [phone]   Fax:  [phone]  Name: GREENLY RARICK MRN: [id number] Date of Birth: 1985/05/29

## 2015-06-22 ENCOUNTER — Encounter: Payer: Self-pay | Admitting: Speech Pathology

## 2015-06-22 NOTE — Therapy (Signed)
[place] MAIN [facility] SERVICES [address] [place], [place], [address] Phone: [phone]   Fax:  [phone]  Speech Language Pathology Treatment  Patient Details  Name: Natasha [name] MRN: [id number] Date of Birth: [birth year] Referring Provider: Dr. Janene Harvey  Encounter Date: 06/21/2015      End of Session - 06/22/15 1324    Visit Number 24   Number of Visits 25   Date for SLP Re-Evaluation 06/16/15   SLP Start Time 40   SLP Stop Time  1500   SLP Time Calculation (min) 60 min   Activity Tolerance Patient tolerated treatment well      Past Medical History  Diagnosis Date  . Restless leg syndrome unk  . Polysubstance abuse     Past Surgical History  Procedure Laterality Date  . Cholecystectomy    . Tubal ligation      There were no vitals filed for this visit.  Visit Diagnosis: Cognitive communication deficit      Subjective Assessment - 06/22/15 1324    Subjective Patient returns with reports that she continues to improve with her thinking. She met with her doctor the previous day and reports that he validated her assessment that she is ready to get a job.   Currently in Pain? No/denies               ADULT SLP TREATMENT - 06/22/15 0001    General Information   Behavior/Cognition Alert;Cooperative;Pleasant mood  Decreased frustration   HPI TBI   Treatment Provided   Treatment provided Cognitive-Linquistic   Pain Assessment   Pain Assessment No/denies pain   Cognitive-Linquistic Treatment   Treatment focused on Cognition   Skilled Treatment Patient was 80% accurate for completing logic puzzles with moderate support from the clinician that included using planning and organizational strategies. Patient was 100% accurate for identifying the relevant components of job descriptions given min support from clinician.   Assessment / Recommendations / Plan   Plan Continue with current plan of care   Progression  Toward Goals   Progression toward goals Progressing toward goals          SLP Education - 06/22/15 1324    Education provided Yes   Education Details job descriptions for potential employment opportunities   Person(s) Educated Patient   Methods Explanation   Comprehension Verbalized understanding            SLP Long Term Goals - 04/19/15 0950    SLP LONG TERM GOAL #1   Title Patient will demonstrate functional cognitive-communication skills for independent completion of personal responsibilities.   Time 12   Period Weeks   Status Partially Met   SLP LONG TERM GOAL #2   Title Patient will complete complex executive function skills tasks with 80% accuracy.   Time 12   Period Weeks   Status Partially Met   SLP LONG TERM GOAL #3   Title Patient will complete complex attention tasks with 80% accuracy.   Time 12   Period Weeks   Status Partially Met   SLP LONG TERM GOAL #4   Title Patient will complete memory strategy activities with 80% accuracy.   Time 12   Period Weeks   Status Partially Met          Plan - 06/22/15 1325    Clinical Impression Statement Patient continues to show improvement on cognitive thinking abilities as evidenced by her performance on logic puzzles and understanding of components of job  descriptions. She still requires some assistance when working through the more complex logic puzzles. Review planning and organizational strategies with logic puzzles for next session.   Speech Therapy Frequency 2x / week   Duration Other (comment)  12 weeks   Treatment/Interventions Compensatory techniques;Cognitive reorganization;Internal/external aids;Functional tasks;SLP instruction and feedback;Compensatory strategies;Patient/family education   Potential to Achieve Goals Good   Potential Considerations Ability to learn/carryover information;Cooperation/participation level;Previous level of function;Severity of impairments;Family/community support   SLP Home  Exercise Plan logical reasoning problems   Consulted and Agree with Plan of Care Patient        Problem List There are no active problems to display for this patient.   Wynelle Cleveland 06/22/2015, 1:26 PM  [place] MAIN [facility] SERVICES [address] [place], [place], [address] Phone: [phone]   Fax:  [phone]   Name: Natasha [name] MRN: [id number] Date of Birth: 03-15-1986

## 2015-06-26 ENCOUNTER — Ambulatory Visit: Payer: Commercial Managed Care - HMO | Admitting: Speech Pathology

## 2015-06-26 ENCOUNTER — Encounter: Payer: Self-pay | Admitting: Physical Therapy

## 2015-06-26 ENCOUNTER — Ambulatory Visit: Payer: Commercial Managed Care - HMO | Admitting: Occupational Therapy

## 2015-06-26 ENCOUNTER — Ambulatory Visit: Payer: Commercial Managed Care - HMO | Admitting: Physical Therapy

## 2015-06-26 DIAGNOSIS — R279 Unspecified lack of coordination: Secondary | ICD-10-CM | POA: Diagnosis not present

## 2015-06-26 DIAGNOSIS — R4689 Other symptoms and signs involving appearance and behavior: Secondary | ICD-10-CM

## 2015-06-26 DIAGNOSIS — R278 Other lack of coordination: Secondary | ICD-10-CM

## 2015-06-26 DIAGNOSIS — Z789 Other specified health status: Secondary | ICD-10-CM

## 2015-06-26 DIAGNOSIS — R2681 Unsteadiness on feet: Secondary | ICD-10-CM

## 2015-06-26 DIAGNOSIS — R41841 Cognitive communication deficit: Secondary | ICD-10-CM

## 2015-06-26 DIAGNOSIS — R531 Weakness: Secondary | ICD-10-CM
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
## 2015-06-26 NOTE — Therapy (Signed)
[place] MAIN [facility] SERVICES [address] [place], [place], [address] Phone: [phone]   Fax:  [phone]  Physical Therapy Treatment  Patient Details  Name: Natasha [name] MRN: [id number] Date of Birth: 1985-07-09 Referring Provider: Eulogio Bear  Encounter Date: 06/26/2015      PT End of Session - 06/26/15 1516    Visit Number 17   Number of Visits 25   Date for PT Re-Evaluation 07/05/15   PT Start Time 0345   PT Stop Time 0430   PT Time Calculation (min) 45 min      Past Medical History  Diagnosis Date  . Restless leg syndrome unk  . Polysubstance abuse     Past Surgical History  Procedure Laterality Date  . Cholecystectomy    . Tubal ligation      There were no vitals filed for this visit.  Visit Diagnosis:  Weakness generalized  Unsteady gait  Lack of coordination      Subjective Assessment - 06/26/15 1621    Subjective Patient says that she has been doing her HEP and is visiting with her children.      THER-EX Standing exercises with 2# ankle weights: Marching 2 x 10; SLR 2 x 10; Abduction 2 x 10; Extension 2 x 10; Knee flexion 2 x 10; Heel raises 2 x 10;  Resisted side-steeping RTB 4 lengths x 2; Standing mini squats 2 x 10 with RTB around knees to encourage abduction; Sit to stand without UE support 2 x 10; Step-ups to 6" step x 10 bilateral; Quantum leg press 105# x 10, 120# x 10;  NEUROMUSCULAR RE-EDUCATION Airex NBOS eyes open/closed x 30 seconds each; Airex NBOS eyes open horizontal and vertical head turns x 30 seconds; Airex cone taps alternating LE x 60 seconds; Tandem gait in // bars x 4 laps  Patient needs occasional verbal cueing to improve posture and cueing to correctly perform exercises slowly, holding at end of range to increase motor firing of desired muscle to encourage fatigue.                              PT Education - 06/26/15 1516    Education  provided Yes   Education Details HEP and strengthening LE   Person(s) Educated Patient   Methods Explanation   Comprehension Verbalized understanding             PT Long Term Goals - 05/28/15 1619    PT LONG TERM GOAL #1   Title Patient will reduce timed up and go to <11 seconds to reduce fall risk and demonstrate improved transfer/gait ability.   Time 12   Period Weeks   Status Partially Met   PT LONG TERM GOAL #2   Title Patient will increase six minute walk test distance to >1000 for progression to community ambulator and improve gait ability   Time 12   Period Weeks   Status Partially Met   PT LONG TERM GOAL #3   Title Patient will increase 10 meter walk test to >1.60ms as to improve gait speed for better community ambulation and to reduce fall    Time 12   Period Weeks   Status Partially Met   PT LONG TERM GOAL #4   Title Patient will tolerate 5 seconds of single leg stance without loss of balance to improve ability to get in and out of shower safely   Time  12   Period Weeks   Status Partially Met   PT LONG TERM GOAL #5   Title Patient (< 29 years old) will complete five times sit to stand test in < 10 seconds indicating an increased LE strength and improved balance   Time 12   Period Weeks               Plan - 06/26/15 1517    Clinical Impression Statement  Fatigue with sit to stand but demonstrating more control, Increase weight for standing exercises. Fatigue still evident with cross trainer and endurance.    Pt will benefit from skilled therapeutic intervention in order to improve on the following deficits Abnormal gait;Decreased balance;Decreased endurance;Decreased mobility;Difficulty walking;Decreased cognition;Decreased safety awareness;Decreased strength;Impaired UE functional use;Obesity   Rehab Potential Good   PT Frequency 2x / week   PT Duration 12 weeks   PT Treatment/Interventions Therapeutic activities;Therapeutic exercise;Balance  training;Neuromuscular re-education;Stair training;Gait training   PT Next Visit Plan strengthening and balance training   PT Home Exercise Plan balance in corner   Consulted and Agree with Plan of Care Patient        Problem List There are no active problems to display for this patient.  Alanson Puls, PT, DPT [place], Minette Headland S 06/26/2015, 4:23 PM  [place] MAIN [facility] SERVICES [address] [place], [place], [address] Phone: [phone]   Fax:  [phone]  Name: Natasha [name] MRN: [id number] Date of Birth: Sep 13, 1985

## 2015-06-27 ENCOUNTER — Encounter: Payer: Self-pay | Admitting: Speech Pathology
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
## 2015-06-28 ENCOUNTER — Encounter: Payer: Self-pay | Admitting: Physical Therapy

## 2015-06-28 ENCOUNTER — Ambulatory Visit: Payer: 59 | Admitting: Physical Therapy

## 2015-06-28 ENCOUNTER — Ambulatory Visit: Payer: 59 | Admitting: Occupational Therapy

## 2015-06-28 ENCOUNTER — Ambulatory Visit: Payer: 59 | Attending: Pediatrics | Admitting: Speech Pathology

## 2015-06-28 DIAGNOSIS — R4689 Other symptoms and signs involving appearance and behavior: Secondary | ICD-10-CM | POA: Diagnosis present

## 2015-06-28 DIAGNOSIS — R270 Ataxia, unspecified: Secondary | ICD-10-CM | POA: Insufficient documentation

## 2015-06-28 DIAGNOSIS — M6281 Muscle weakness (generalized): Secondary | ICD-10-CM | POA: Insufficient documentation

## 2015-06-28 DIAGNOSIS — R531 Weakness: Secondary | ICD-10-CM | POA: Insufficient documentation

## 2015-06-28 DIAGNOSIS — R279 Unspecified lack of coordination: Secondary | ICD-10-CM

## 2015-06-28 DIAGNOSIS — R41841 Cognitive communication deficit: Secondary | ICD-10-CM | POA: Diagnosis present

## 2015-06-28 DIAGNOSIS — R278 Other lack of coordination: Secondary | ICD-10-CM | POA: Insufficient documentation

## 2015-06-28 DIAGNOSIS — Z789 Other specified health status: Secondary | ICD-10-CM

## 2015-06-28 DIAGNOSIS — R2681 Unsteadiness on feet: Secondary | ICD-10-CM | POA: Insufficient documentation

## 2015-06-28 NOTE — Therapy (Signed)
[place] MAIN [facility] SERVICES [place], [place], [address] Phone: [phone]   Fax:  [phone]  Physical Therapy Treatment  Patient Details  Name: Natasha [name] MRN: [id number] Date of Birth: Aug 21, 1985 Referring Provider: Eulogio Bear  Encounter Date: 06/28/2015      PT End of Session - 06/28/15 1504    Visit Number 18   Number of Visits 25   Date for PT Re-Evaluation 07/05/15   PT Start Time 0345   PT Stop Time 0430   PT Time Calculation (min) 45 min      Past Medical History  Diagnosis Date  . Restless leg syndrome unk  . Polysubstance abuse     Past Surgical History  Procedure Laterality Date  . Cholecystectomy    . Tubal ligation      There were no vitals filed for this visit.  Visit Diagnosis:  Unsteady gait  Weakness generalized  Lack of coordination      Subjective Assessment - 06/28/15 1557    Subjective Patient says that she thinks that her mom is nervous about her using the loft strand crutches.      THER-EX Standing exercises with 2# ankle weights: Marching 2 x 10; SLR 2 x 10; Abduction 2 x 10; Extension 2 x 10; Knee flexion 2 x 10; Heel raises 2 x 10;  Resisted side-steeping RTB 4 lengths x 2; Standing mini squats 2 x 10 with RTB around knees to encourage abduction; Sit to stand without UE support 2 x 10; Step-ups to 6" step x 10 bilateral; Quantum leg press 90 lbs x20 x 3  Gait training with loft strand crutches and 1000 feet with CGA Patient needs occasional verbal cueing to improve posture and cueing to correctly perform exercises slowly, holding at end of range to increase motor firing of desired muscle to encourage fatigue.                              PT Education - 06/28/15 1503    Education provided Yes   Education Details HEP   Person(s) Educated Patient   Methods Explanation   Comprehension Verbalized understanding             PT  Long Term Goals - 05/28/15 1619    PT LONG TERM GOAL #1   Title Patient will reduce timed up and go to <11 seconds to reduce fall risk and demonstrate improved transfer/gait ability.   Time 12   Period Weeks   Status Partially Met   PT LONG TERM GOAL #2   Title Patient will increase six minute walk test distance to >1000 for progression to community ambulator and improve gait ability   Time 12   Period Weeks   Status Partially Met   PT LONG TERM GOAL #3   Title Patient will increase 10 meter walk test to >1.48ms as to improve gait speed for better community ambulation and to reduce fall    Time 12   Period Weeks   Status Partially Met   PT LONG TERM GOAL #4   Title Patient will tolerate 5 seconds of single leg stance without loss of balance to improve ability to get in and out of shower safely   Time 12   Period Weeks   Status Partially Met   PT LONG TERM GOAL #5   Title Patient (< 640years old) will complete five times sit  to stand test in < 10 seconds indicating an increased LE strength and improved balance   Time 12   Period Weeks               Plan - 06/28/15 1559    Clinical Impression Statement  Fatigue with sit to stand but demonstrating more control, Increase weight for standing exercises. Fatigue still evident with cross trainer and endurance   Pt will benefit from skilled therapeutic intervention in order to improve on the following deficits Abnormal gait;Decreased balance;Decreased endurance;Decreased mobility;Difficulty walking;Decreased cognition;Decreased safety awareness;Decreased strength;Impaired UE functional use;Obesity   Rehab Potential Good   PT Frequency 2x / week   PT Duration 12 weeks   PT Treatment/Interventions Therapeutic activities;Therapeutic exercise;Balance training;Neuromuscular re-education;Stair training;Gait training   PT Next Visit Plan strengthening and balance training   PT Home Exercise Plan balance in corner   Consulted and Agree  with Plan of Care Patient        Problem List There are no active problems to display for this patient.  Alanson Puls, PT, DPT [place], Minette Headland S 06/28/2015, 4:00 PM  [place] MAIN [facility] SERVICES [address] [place], [place], [address] Phone: [phone]   Fax:  [phone]  Name: Natasha [name] MRN: [id number] Date of Birth: 22-Aug-1985
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
## 2015-06-29 ENCOUNTER — Encounter: Payer: Self-pay | Admitting: Speech Pathology

## 2015-06-29 NOTE — Therapy (Signed)
[place] MAIN [facility] SERVICES [address] [place], [place], [address] Phone: [phone]   Fax:  [phone]  Speech Language Pathology Treatment  Patient Details  Name: Natasha [name] MRN: [id number] Date of Birth: Jul 22, 1985 Referring Provider: Dr. Janene Harvey  Encounter Date: 06/28/2015      End of Session - 06/29/15 0947    Visit Number 26   Number of Visits 70   Date for SLP Re-Evaluation 09/21/15   SLP Start Time 1400   SLP Stop Time  1457   SLP Time Calculation (min) 57 min   Activity Tolerance Patient tolerated treatment well      Past Medical History  Diagnosis Date  . Restless leg syndrome unk  . Polysubstance abuse     Past Surgical History  Procedure Laterality Date  . Cholecystectomy    . Tubal ligation      There were no vitals filed for this visit.  Visit Diagnosis: Cognitive communication deficit      Subjective Assessment - 06/29/15 0946    Subjective Patient returns with reports that she continues to improve with her thinking. She attended her first meeting with vocational rehabilitation and reports that it went very well.   Currently in Pain? No/denies               ADULT SLP TREATMENT - 06/29/15 0001    General Information   Behavior/Cognition Alert;Cooperative;Pleasant mood   HPI TBI   Treatment Provided   Treatment provided Cognitive-Linquistic   Pain Assessment   Pain Assessment No/denies pain   Cognitive-Linquistic Treatment   Treatment focused on Cognition   Skilled Treatment Patient was 80% accurate for completing logic puzzles that focused on scheduling with moderate support from the clinician that included using planning and organizational strategies. Patient was 90% accurate for making appropriate inferences about images that depicted unsafe situations with moderate support from clinician. Patient was 80% accurate for solving deductive reasoning problems related to a short passage  with maximum support.   Assessment / Recommendations / Plan   Plan Continue with current plan of care   Progression Toward Goals   Progression toward goals Progressing toward goals          SLP Education - 06/29/15 0946    Education provided Yes   Education Details planning and organizational strategies; deductive reasoning strategies   Person(s) Educated Patient   Methods Explanation   Comprehension Verbalized understanding            SLP Long Term Goals - 06/27/15 1317    SLP LONG TERM GOAL #1   Title Patient will demonstrate functional cognitive-communication skills for independent completion of personal responsibilities.   Status Partially Met   SLP LONG TERM GOAL #2   Title Patient will complete complex executive function skills tasks with 80% accuracy.   Status Partially Met   SLP LONG TERM GOAL #3   Title Patient will complete complex attention tasks with 80% accuracy.   Status Achieved   SLP LONG TERM GOAL #4   Title Patient will complete memory strategy activities with 80% accuracy.   Status Partially Met   SLP LONG TERM GOAL #5   Title Patient will generate grammatical and cogent sentences to complete abstract/complex linguistic tasks with 80% accuracy   Status New          Plan - 06/29/15 0948    Clinical Impression Statement Patient continues to show improvement on cognitive thinking abilities as evidenced by her performance  on logic puzzles and her ability to think through depictions of situations that may lead to adverse events. She still requires some assistance when working through the more complex logic puzzles. Continue to review planning and organizational strategies with logic puzzles for next session. She reports she had a positive meeting with vocational rehabilitation on Wednesday of this week and reports her next steps are to file paper work and meet with her counselor.   Speech Therapy Frequency 2x / week   Duration Other (comment)  12 weeks    Treatment/Interventions Compensatory techniques;Cognitive reorganization;Internal/external aids;Functional tasks;SLP instruction and feedback;Compensatory strategies;Patient/family education   Potential to Achieve Goals Good   Potential Considerations Ability to learn/carryover information;Cooperation/participation level;Previous level of function;Severity of impairments;Family/community support   SLP Home Exercise Plan to be determined   Consulted and Agree with Plan of Care Patient        Problem List There are no active problems to display for this patient.   Wynelle Cleveland 06/29/2015, 9:51 AM  [place] MAIN [facility] SERVICES [address] [place], [place], [address] Phone: [phone]   Fax:  [phone]   Name: Natasha [name] MRN: [id number] Date of Birth: 08-31-1985

## 2015-07-02 ENCOUNTER — Ambulatory Visit: Payer: 59 | Admitting: Occupational Therapy

## 2015-07-02 ENCOUNTER — Ambulatory Visit: Payer: 59 | Admitting: Physical Therapy

## 2015-07-02 ENCOUNTER — Ambulatory Visit: Payer: 59 | Admitting: Speech Pathology

## 2015-07-03 ENCOUNTER — Encounter: Payer: 59 | Admitting: Speech Pathology

## 2015-07-04 ENCOUNTER — Ambulatory Visit: Payer: 59 | Admitting: Physical Therapy

## 2015-07-04 ENCOUNTER — Ambulatory Visit: Payer: 59 | Admitting: Speech Pathology

## 2015-07-04 ENCOUNTER — Ambulatory Visit: Payer: 59 | Admitting: Occupational Therapy

## 2015-07-04 ENCOUNTER — Encounter: Payer: Self-pay | Admitting: Physical Therapy

## 2015-07-04 DIAGNOSIS — Z789 Other specified health status: Secondary | ICD-10-CM

## 2015-07-04 DIAGNOSIS — R4689 Other symptoms and signs involving appearance and behavior: Secondary | ICD-10-CM

## 2015-07-04 DIAGNOSIS — R41841 Cognitive communication deficit: Secondary | ICD-10-CM

## 2015-07-04 DIAGNOSIS — R531 Weakness: Secondary | ICD-10-CM

## 2015-07-04 DIAGNOSIS — R279 Unspecified lack of coordination: Secondary | ICD-10-CM

## 2015-07-04 DIAGNOSIS — R2681 Unsteadiness on feet: Secondary | ICD-10-CM

## 2015-07-04 NOTE — Therapy (Signed)
[place] MAIN [facility] SERVICES [place], [place], [address] Phone: [phone]   Fax:  [phone]  Physical Therapy Treatment  Patient Details  Name: Natasha [name] MRN: [id number] Date of Birth: [birth year] Referring Provider: Eulogio Bear  Encounter Date: 07/04/2015      PT End of Session - 07/04/15 1325    Visit Number 19   Number of Visits 25   Date for PT Re-Evaluation 07/05/15   PT Start Time 0100   PT Stop Time 0145   PT Time Calculation (min) 45 min      Past Medical History  Diagnosis Date  . Restless leg syndrome unk  . Polysubstance abuse     Past Surgical History  Procedure Laterality Date  . Cholecystectomy    . Tubal ligation      There were no vitals filed for this visit.  Visit Diagnosis:  Unsteady gait  Weakness generalized  Lack of coordination      Subjective Assessment - 07/04/15 1324    Subjective Patient says that she is feeling good today.     Currently in Pain? No/denies      Resisted side-steeping RTB 4 lengths x 2; Standing mini squats 2 x 10 with RTB around knees to encourage abduction; Sit to stand without UE support 2 x 10; Step-ups to 6" step x 10 bilateral; Quantum leg press 100# x 10,   Gait training with loft strand crutches 1000 feet and in/out of door ways and outside on uneven surfaces with SBA. Patient has on new shoes and her feet are catching on the carpet occasionally.                             PT Education - 07/04/15 1325    Education provided Yes   Education Details saftey with loft strand crutches   Person(s) Educated Patient   Comprehension Verbalized understanding             PT Long Term Goals - 05/28/15 1619    PT LONG TERM GOAL #1   Title Patient will reduce timed up and go to <11 seconds to reduce fall risk and demonstrate improved transfer/gait ability.   Time 12   Period Weeks   Status Partially Met   PT  LONG TERM GOAL #2   Title Patient will increase six minute walk test distance to >1000 for progression to community ambulator and improve gait ability   Time 12   Period Weeks   Status Partially Met   PT LONG TERM GOAL #3   Title Patient will increase 10 meter walk test to >1.38ms as to improve gait speed for better community ambulation and to reduce fall    Time 12   Period Weeks   Status Partially Met   PT LONG TERM GOAL #4   Title Patient will tolerate 5 seconds of single leg stance without loss of balance to improve ability to get in and out of shower safely   Time 12   Period Weeks   Status Partially Met   PT LONG TERM GOAL #5   Title Patient (< 628years old) will complete five times sit to stand test in < 10 seconds indicating an increased LE strength and improved balance   Time 12   Period Weeks               Plan - 07/04/15 1405  Clinical Impression Statement Instruction in use of adapted devices.   Pt will benefit from skilled therapeutic intervention in order to improve on the following deficits Abnormal gait;Decreased balance;Decreased endurance;Decreased mobility;Difficulty walking;Decreased cognition;Decreased safety awareness;Decreased strength;Impaired UE functional use;Obesity   Rehab Potential Good   PT Frequency 2x / week   PT Duration 12 weeks   PT Treatment/Interventions Therapeutic activities;Therapeutic exercise;Balance training;Neuromuscular re-education;Stair training;Gait training   PT Next Visit Plan strengthening and balance training   PT Home Exercise Plan balance in corner   Consulted and Agree with Plan of Care Patient        Problem List There are no active problems to display for this patient.   Alanson Puls 07/04/2015, 2:07 PM  [place] MAIN [facility] SERVICES [address] [place], [place], [address] Phone: [phone]   Fax:  [phone]  Name: Natasha [name] MRN:  [id number] Date of Birth: 28-Jul-1985
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
## 2015-07-05 ENCOUNTER — Encounter: Payer: Self-pay | Admitting: Speech Pathology

## 2015-07-05 NOTE — Therapy (Signed)
[place] MAIN [facility] SERVICES [place], [place], [address] Phone: [phone]   Fax:  [phone]  Speech Language Pathology Treatment  Patient Details  Name: Natasha [name] MRN: [id number] Date of Birth: 1985-07-09 Referring Provider: Dr. Janene Harvey  Encounter Date: 07/04/2015      End of Session - 07/05/15 0840    Visit Number 27   Number of Visits 49   Date for SLP Re-Evaluation 09/21/15   SLP Start Time 26   SLP Stop Time  1500   SLP Time Calculation (min) 60 min   Activity Tolerance Patient tolerated treatment well      Past Medical History  Diagnosis Date  . Restless leg syndrome unk  . Polysubstance abuse     Past Surgical History  Procedure Laterality Date  . Cholecystectomy    . Tubal ligation      There were no vitals filed for this visit.  Visit Diagnosis: Cognitive communication deficit      Subjective Assessment - 07/05/15 0839    Subjective Patient returns with reports that she continues to improve with her thinking. She is working on her paper work for vocational rehab and is looking forward to her first meeting with her    Currently in Pain? No/denies               ADULT SLP TREATMENT - 07/05/15 0001    General Information   Behavior/Cognition Alert;Cooperative;Pleasant mood   HPI TBI   Treatment Provided   Treatment provided Cognitive-Linquistic   Pain Assessment   Pain Assessment No/denies pain   Cognitive-Linquistic Treatment   Treatment focused on Cognition   Skilled Treatment Patient was 70% accurate for completing logic puzzles that focused on making inferences from given information with moderate support from the clinician that included cues to attend to information relevant to the task at hand, and planning and organizational strategies. Patient was 75% accurate for solving deductive reasoning problems related to a short passage with maximum support.   Assessment /  Recommendations / Plan   Plan Continue with current plan of care   Progression Toward Goals   Progression toward goals Progressing toward goals          SLP Education - 07/05/15 0839    Education provided Yes   Education Details write down key information during logical thinking problems for ogranization and planning   Person(s) Educated Patient   Methods Explanation;Demonstration   Comprehension Verbalized understanding;Returned demonstration;Need further instruction            SLP Long Term Goals - 06/27/15 1317    SLP LONG TERM GOAL #1   Title Patient will demonstrate functional cognitive-communication skills for independent completion of personal responsibilities.   Status Partially Met   SLP LONG TERM GOAL #2   Title Patient will complete complex executive function skills tasks with 80% accuracy.   Status Partially Met   SLP LONG TERM GOAL #3   Title Patient will complete complex attention tasks with 80% accuracy.   Status Achieved   SLP LONG TERM GOAL #4   Title Patient will complete memory strategy activities with 80% accuracy.   Status Partially Met   SLP LONG TERM GOAL #5   Title Patient will generate grammatical and cogent sentences to complete abstract/complex linguistic tasks with 80% accuracy   Status New          Plan - 07/05/15 0840    Clinical Impression Statement Patient continues to show  improvement on cognitive thinking abilities as evidenced by her performance on logic puzzles and her ability to think make inferences from given information. She still requires some assistance when working through the more complex logic puzzles. Cues to write down key information and to be patient with herself were helpful during today's session. Continue to review planning and organizational strategies with logic puzzles for next session. She reports she is still working on finishing her paper work for [employer] and that her next step will be to set up a meeting  with her counselor.   Speech Therapy Frequency 2x / week   Duration Other (comment)  12 weeks   Treatment/Interventions Compensatory techniques;Cognitive reorganization;Internal/external aids;Functional tasks;SLP instruction and feedback;Compensatory strategies;Patient/family education   Potential to Achieve Goals Good   Potential Considerations Ability to learn/carryover information;Cooperation/participation level;Previous level of function;Severity of impairments;Family/community support   SLP Home Exercise Plan finish VR paper work and set up a meeting with counselor   Consulted and Agree with Plan of Care Patient        Problem List There are no active problems to display for this patient.   Wynelle Cleveland 07/05/2015, 8:41 AM  [place] MAIN [facility] SERVICES [address] [place], [place], [address] Phone: [phone]   Fax:  [phone]   Name: Natasha [name] MRN: [id number] Date of Birth: [birth year]

## 2015-07-09 ENCOUNTER — Ambulatory Visit: Payer: 59 | Admitting: Speech Pathology

## 2015-07-09 ENCOUNTER — Ambulatory Visit: Payer: 59 | Admitting: Physical Therapy

## 2015-07-09 ENCOUNTER — Ambulatory Visit: Payer: 59 | Admitting: Occupational Therapy

## 2015-07-09 DIAGNOSIS — R279 Unspecified lack of coordination: Secondary | ICD-10-CM

## 2015-07-09 DIAGNOSIS — R531 Weakness: Secondary | ICD-10-CM

## 2015-07-09 DIAGNOSIS — M6281 Muscle weakness (generalized): Secondary | ICD-10-CM

## 2015-07-09 DIAGNOSIS — R278 Other lack of coordination: Secondary | ICD-10-CM

## 2015-07-09 DIAGNOSIS — Z789 Other specified health status: Secondary | ICD-10-CM

## 2015-07-09 DIAGNOSIS — R2681 Unsteadiness on feet: Secondary | ICD-10-CM

## 2015-07-09 DIAGNOSIS — R41841 Cognitive communication deficit: Secondary | ICD-10-CM | POA: Diagnosis not present

## 2015-07-09 DIAGNOSIS — R4689 Other symptoms and signs involving appearance and behavior: Secondary | ICD-10-CM

## 2015-07-09 NOTE — Therapy (Signed)
[place] MAIN [facility] SERVICES [address] [place], [place], [address] Phone: [phone]   Fax:  [phone]  Physical Therapy Treatment  Patient Details  Name: Natasha [name] MRN: [id number] Date of Birth: [birth year] Referring Provider: Eulogio Bear  Encounter Date: 07/09/2015      PT End of Session - 07/09/15 1659    Visit Number 20   Number of Visits 25   Date for PT Re-Evaluation 07/05/15   PT Start Time 0300   PT Stop Time 0345   PT Time Calculation (min) 45 min   Activity Tolerance Patient tolerated treatment well      Past Medical History  Diagnosis Date  . Restless leg syndrome unk  . Polysubstance abuse     Past Surgical History  Procedure Laterality Date  . Cholecystectomy    . Tubal ligation      There were no vitals filed for this visit.  Visit Diagnosis:  Lack of coordination  Muscle weakness (generalized)  Unsteady gait  Loss of coordination  Weakness generalized      Subjective Assessment - 07/09/15 1658    Subjective Patient says that she is feeling good today.     Currently in Pain? No/denies      Gait training with loftstrand crutches on level surfaces and ramp, side stepping and going thru doorways and elevators with SBA. No loss of balance with decreased gait speed.                            PT Education - 07/09/15 1658    Education provided Yes   Education Details safety with crutches   Person(s) Educated Patient   Methods Explanation   Comprehension Verbalized understanding             PT Long Term Goals - 05/28/15 1619    PT LONG TERM GOAL #1   Title Patient will reduce timed up and go to <11 seconds to reduce fall risk and demonstrate improved transfer/gait ability.   Time 12   Period Weeks   Status Partially Met   PT LONG TERM GOAL #2   Title Patient will increase six minute walk test distance to >1000 for progression to community ambulator  and improve gait ability   Time 12   Period Weeks   Status Partially Met   PT LONG TERM GOAL #3   Title Patient will increase 10 meter walk test to >1.7ms as to improve gait speed for better community ambulation and to reduce fall    Time 12   Period Weeks   Status Partially Met   PT LONG TERM GOAL #4   Title Patient will tolerate 5 seconds of single leg stance without loss of balance to improve ability to get in and out of shower safely   Time 12   Period Weeks   Status Partially Met   PT LONG TERM GOAL #5   Title Patient (< 613years old) will complete five times sit to stand test in < 10 seconds indicating an increased LE strength and improved balance   Time 12   Period Weeks               Plan - 07/09/15 1659    Clinical Impression Statement Instruction in loft strand crutches on ramp and level surfaces.   Pt will benefit from skilled therapeutic intervention in order to improve on the following deficits Abnormal gait;Decreased balance;Decreased  endurance;Decreased mobility;Difficulty walking;Decreased cognition;Decreased safety awareness;Decreased strength;Impaired UE functional use;Obesity   Rehab Potential Good   PT Frequency 2x / week   PT Duration 12 weeks   PT Treatment/Interventions Therapeutic activities;Therapeutic exercise;Balance training;Neuromuscular re-education;Stair training;Gait training   PT Next Visit Plan strengthening and balance training   PT Home Exercise Plan balance in corner   Consulted and Agree with Plan of Care Patient        Problem List There are no active problems to display for this patient.  Alanson Puls, PT, DPT [place], [place] S 07/09/2015, 5:01 PM  [place] MAIN [facility] SERVICES [address] [place], [place], [address] Phone: [phone]   Fax:  [phone]  Name: HUNTER PINKARD MRN: [id number] Date of Birth: [birth year]
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
## 2015-07-10 ENCOUNTER — Encounter: Payer: Self-pay | Admitting: Speech Pathology

## 2015-07-10 NOTE — Therapy (Signed)
[name] MAIN [facility] SERVICES [address] [place], [place], [address] Phone: [phone]   Fax:  [phone]  Speech Language Pathology Treatment  Patient Details  Name: Natasha [name] MRN: [id number] Date of Birth: Jan 26, 1986 Referring Provider: Dr. Janene Harvey  Encounter Date: 07/09/2015      End of Session - 07/10/15 1114    Visit Number 28   Number of Visits 49   Date for SLP Re-Evaluation 09/21/15   SLP Start Time 52   SLP Stop Time  1500   SLP Time Calculation (min) 60 min   Activity Tolerance Patient tolerated treatment well      Past Medical History  Diagnosis Date  . Restless leg syndrome unk  . Polysubstance abuse     Past Surgical History  Procedure Laterality Date  . Cholecystectomy    . Tubal ligation      There were no vitals filed for this visit.  Visit Diagnosis: Cognitive communication deficit      Subjective Assessment - 07/10/15 1113    Subjective Patient returns with reports that she continues to improve with her thinking. She is working on her paper work for vocational rehab and is looking forward to her first meeting with her    Currently in Pain? No/denies               ADULT SLP TREATMENT - 07/10/15 0001    General Information   Behavior/Cognition Alert;Cooperative;Pleasant mood   HPI TBI   Treatment Provided   Treatment provided Cognitive-Linquistic   Pain Assessment   Pain Assessment No/denies pain   Cognitive-Linquistic Treatment   Treatment focused on Cognition   Skilled Treatment WORD FINDING: state reason for item that does not belong with 70% accuracy; 90% given min cues to clarify reason by verbalizing category.  ATTENTION:  The patient demonstrated improved sustained attention for difficult tasks, with less tendency to "give up".  READING COMPREHENSION:  Read mystery story (Grade 3-5) and answer factual questions with 100% accuracy; solve mystery given no cues. REASONING:  complete logical solutions task (solving tricks) with 90% accuracy, demonstrating increased use of verbalizing her thoughts to reason through the problem.     Assessment / Recommendations / Plan   Plan Continue with current plan of care   Progression Toward Goals   Progression toward goals Progressing toward goals          SLP Education - 07/10/15 1113    Education provided Yes   Education Details Be persistent when working through problems   Person(s) Educated Patient   Methods Explanation   Comprehension Verbalized understanding            SLP Long Term Goals - 06/27/15 1317    SLP LONG TERM GOAL #1   Title Patient will demonstrate functional cognitive-communication skills for independent completion of personal responsibilities.   Status Partially Met   SLP LONG TERM GOAL #2   Title Patient will complete complex executive function skills tasks with 80% accuracy.   Status Partially Met   SLP LONG TERM GOAL #3   Title Patient will complete complex attention tasks with 80% accuracy.   Status Achieved   SLP LONG TERM GOAL #4   Title Patient will complete memory strategy activities with 80% accuracy.   Status Partially Met   SLP LONG TERM GOAL #5   Title Patient will generate grammatical and cogent sentences to complete abstract/complex linguistic tasks with 80% accuracy   Status New  Plan - 07/10/15 1114    Clinical Impression Statement The patient is attending well to simple therapeutic tasks and is exhibiting less frustration with more difficult tasks.  She is having word retrieval problems, abstract thinking problems, and visual perceptual problems.  She enjoys language tasks and is better able to independently identify and problem solve errors.   Speech Therapy Frequency 2x / week   Duration Other (comment)   Treatment/Interventions Compensatory techniques;Cognitive reorganization;Internal/external aids;Functional tasks;SLP instruction and  feedback;Compensatory strategies;Patient/family education   Potential to Achieve Goals Good   Potential Considerations Ability to learn/carryover information;Cooperation/participation level;Previous level of function;Severity of impairments;Family/community support   Consulted and Agree with Plan of Care Patient        Problem List There are no active problems to display for this patient.  Leroy Sea, MS/CCC- SLP  Lou Miner 07/10/2015, 11:16 AM  [place] MAIN [facility] SERVICES [address] [place], [place], [address] Phone: [phone]   Fax:  [phone]   Name: Natasha [name] MRN: [id number] Date of Birth: [birth year]

## 2015-07-11 ENCOUNTER — Encounter: Payer: Self-pay | Admitting: Physical Therapy

## 2015-07-11 ENCOUNTER — Ambulatory Visit: Payer: 59 | Admitting: Occupational Therapy

## 2015-07-11 ENCOUNTER — Ambulatory Visit: Payer: 59 | Admitting: Physical Therapy

## 2015-07-11 ENCOUNTER — Ambulatory Visit: Payer: 59 | Admitting: Speech Pathology

## 2015-07-11 DIAGNOSIS — R2681 Unsteadiness on feet: Secondary | ICD-10-CM

## 2015-07-11 DIAGNOSIS — R531 Weakness: Secondary | ICD-10-CM

## 2015-07-11 DIAGNOSIS — R279 Unspecified lack of coordination: Secondary | ICD-10-CM

## 2015-07-11 DIAGNOSIS — R41841 Cognitive communication deficit: Secondary | ICD-10-CM

## 2015-07-11 NOTE — Therapy (Signed)
[place] MAIN [facility] SERVICES [address] [place], [place], [address] Phone: [phone]   Fax:  [phone]  Physical Therapy Treatment  Patient Details  Name: Natasha [name] MRN: [id number] Date of Birth: 06-21-1985 Referring Provider: Eulogio Bear  Encounter Date: 07/11/2015      PT End of Session - 07/11/15 1529    Visit Number 21   Number of Visits 25   Date for PT Re-Evaluation 07/05/15   PT Start Time 0300   PT Stop Time 0345   PT Time Calculation (min) 45 min   Equipment Utilized During Treatment Gait belt   Activity Tolerance Patient tolerated treatment well;No increased pain;Patient limited by fatigue      Past Medical History  Diagnosis Date  . Restless leg syndrome unk  . Polysubstance abuse     Past Surgical History  Procedure Laterality Date  . Cholecystectomy    . Tubal ligation      There were no vitals filed for this visit.  Visit Diagnosis:  Weakness generalized  Lack of coordination  Unsteady gait      Subjective Assessment - 07/11/15 1529    Subjective Patient says that she is feeling good today.     Currently in Pain? No/denies     Therapeutic exercise:   Heel raises 2 x 10 4 way hip Resisted side-steeping RTB 4 lengths x 2; Standing mini squats 2 x 10 with RTB around knees to encourage abduction; Sit to stand without UE support 2 x 10; Step-ups to 6" step x 10 bilateral; Quantum leg press 105# x 10, 120# x 10 Gait training with loft strand crutches and level surfaces, doorways, elevator, ramp and tight spaces with SBA.  Patient needs occasional verbal cueing to improve posture and cueing to correctly perform exercises slowly, holding at end of range to increase motor firing of desired muscle to encourage fatigue. No loss of balance with gait training.                             PT Education - 07/11/15 1529    Education provided Yes   Education Details safety  with getting on and off elevator   Person(s) Educated Patient   Methods Explanation   Comprehension Verbalized understanding             PT Long Term Goals - 05/28/15 1619    PT LONG TERM GOAL #1   Title Patient will reduce timed up and go to <11 seconds to reduce fall risk and demonstrate improved transfer/gait ability.   Time 12   Period Weeks   Status Partially Met   PT LONG TERM GOAL #2   Title Patient will increase six minute walk test distance to >1000 for progression to community ambulator and improve gait ability   Time 12   Period Weeks   Status Partially Met   PT LONG TERM GOAL #3   Title Patient will increase 10 meter walk test to >1.25ms as to improve gait speed for better community ambulation and to reduce fall    Time 12   Period Weeks   Status Partially Met   PT LONG TERM GOAL #4   Title Patient will tolerate 5 seconds of single leg stance without loss of balance to improve ability to get in and out of shower safely   Time 12   Period Weeks   Status Partially Met   PT LONG  TERM GOAL #5   Title Patient (< 64 years old) will complete five times sit to stand test in < 10 seconds indicating an increased LE strength and improved balance   Time 12   Period Weeks               Plan - 07/11/15 1530    Clinical Impression Statement Instruction with loft strand crutches in/off elevator.   Pt will benefit from skilled therapeutic intervention in order to improve on the following deficits Abnormal gait;Decreased balance;Decreased endurance;Decreased mobility;Difficulty walking;Decreased cognition;Decreased safety awareness;Decreased strength;Impaired UE functional use;Obesity   Rehab Potential Good   PT Frequency 2x / week   PT Duration 12 weeks   PT Treatment/Interventions Therapeutic activities;Therapeutic exercise;Balance training;Neuromuscular re-education;Stair training;Gait training   PT Next Visit Plan strengthening and balance training   PT Home  Exercise Plan balance in corner   Consulted and Agree with Plan of Care Patient        Problem List There are no active problems to display for this patient. Alanson Puls, PT, DPT  [place]ler, Minette Headland S 07/11/2015, 3:33 PM  [place] MAIN [facility] SERVICES [address] [place], [place], [address] Phone: [phone]   Fax:  [phone]  Name: Natasha [name] MRN: [id number] Date of Birth: May 23, 1985
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
## 2015-07-12 ENCOUNTER — Encounter: Payer: Self-pay | Admitting: Speech Pathology

## 2015-07-12 NOTE — Therapy (Signed)
[place] MAIN [facility] SERVICES [address] [place], [place], [address] Phone: [phone]   Fax:  [phone]  Speech Language Pathology Treatment  Patient Details  Name: BLAKELYN DINGES MRN: [id number] Date of Birth: 1985-05-15 Referring Provider: Dr. Janene Harvey  Encounter Date: 07/11/2015      End of Session - 07/12/15 0945    Visit Number 29   Number of Visits 53   Date for SLP Re-Evaluation 09/21/15   SLP Start Time 1400   SLP Stop Time  1455   SLP Time Calculation (min) 55 min   Activity Tolerance Patient tolerated treatment well      Past Medical History  Diagnosis Date  . Restless leg syndrome unk  . Polysubstance abuse     Past Surgical History  Procedure Laterality Date  . Cholecystectomy    . Tubal ligation      There were no vitals filed for this visit.  Visit Diagnosis: Cognitive communication deficit      Subjective Assessment - 07/12/15 0943    Subjective The patient would like to be able to live independently   Currently in Pain? No/denies               ADULT SLP TREATMENT - 07/12/15 0001    General Information   Behavior/Cognition Alert;Cooperative;Pleasant mood   HPI TBI   Treatment Provided   Treatment provided Cognitive-Linquistic   Pain Assessment   Pain Assessment No/denies pain   Cognitive-Linquistic Treatment   Treatment focused on Cognition   Skilled Treatment WORD FINDING: state reason for item that does not belong with 80% accuracy; 100% given min cues to clarify reason by verbalizing category.  ATTENTION:  The patient demonstrated improved sustained attention for difficult tasks, with less tendency to "give up".  READING COMPREHENSION:  Read mystery story (Grade 3-5) and answer factual questions with 100% accuracy; solve mystery given no cues. REASONING: complete logical solutions task (literal meaning) with 90% accuracy, demonstrating increased use of verbalizing her thoughts to reason  through the problem and generating multiple hypotheses.     Assessment / Recommendations / Plan   Plan Continue with current plan of care   Progression Toward Goals   Progression toward goals Progressing toward goals          SLP Education - 07/12/15 0944    Education provided Yes   Education Details Begin identifying barriers to independent living and begin taking responsibility for taks her mother is completing (eg., filling medicine boxes)   Person(s) Educated Patient   Methods Explanation   Comprehension Verbalized understanding            SLP Long Term Goals - 06/27/15 1317    SLP LONG TERM GOAL #1   Title Patient will demonstrate functional cognitive-communication skills for independent completion of personal responsibilities.   Status Partially Met   SLP LONG TERM GOAL #2   Title Patient will complete complex executive function skills tasks with 80% accuracy.   Status Partially Met   SLP LONG TERM GOAL #3   Title Patient will complete complex attention tasks with 80% accuracy.   Status Achieved   SLP LONG TERM GOAL #4   Title Patient will complete memory strategy activities with 80% accuracy.   Status Partially Met   SLP LONG TERM GOAL #5   Title Patient will generate grammatical and cogent sentences to complete abstract/complex linguistic tasks with 80% accuracy   Status New  Plan - 07/12/15 0946    Clinical Impression Statement The patient has identified returning to independent living as her primary goal.  We will continue to determine cognitive barriers to independence and devise therapeutic tasks to increase safe independence.   Speech Therapy Frequency 2x / week   Duration Other (comment)   Treatment/Interventions Compensatory techniques;Cognitive reorganization;Internal/external aids;Functional tasks;SLP instruction and feedback;Compensatory strategies;Patient/family education   Potential to Achieve Goals Good   Potential Considerations Ability  to learn/carryover information;Cooperation/participation level;Previous level of function;Severity of impairments;Family/community support   SLP Home Exercise Plan Begin taking reponsibility for medicine   Consulted and Agree with Plan of Care Patient        Problem List There are no active problems to display for this patient.  Leroy Sea, MS/CCC- SLP  Lou Miner 07/12/2015, 9:50 AM  [place] MAIN [facility] SERVICES [address] [place], [place], [address] Phone: [phone]   Fax:  [phone]   Name: DENESHA BROUSE MRN: [id number] Date of Birth: 06/28/1985

## 2015-07-16 ENCOUNTER — Ambulatory Visit: Payer: 59 | Admitting: Occupational Therapy

## 2015-07-16 ENCOUNTER — Ambulatory Visit: Payer: 59 | Admitting: Physical Therapy

## 2015-07-16 ENCOUNTER — Encounter: Payer: 59 | Admitting: Occupational Therapy

## 2015-07-16 ENCOUNTER — Ambulatory Visit: Payer: 59 | Admitting: Speech Pathology

## 2015-07-16 DIAGNOSIS — R2681 Unsteadiness on feet: Secondary | ICD-10-CM

## 2015-07-16 DIAGNOSIS — R279 Unspecified lack of coordination: Secondary | ICD-10-CM

## 2015-07-16 DIAGNOSIS — R4689 Other symptoms and signs involving appearance and behavior: Secondary | ICD-10-CM

## 2015-07-16 DIAGNOSIS — Z789 Other specified health status: Secondary | ICD-10-CM

## 2015-07-16 DIAGNOSIS — R41841 Cognitive communication deficit: Secondary | ICD-10-CM | POA: Diagnosis not present

## 2015-07-16 DIAGNOSIS — R531 Weakness: Secondary | ICD-10-CM
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
## 2015-07-16 NOTE — Patient Instructions (Signed)
Pt. performed light meal preparation preparing classic oatmeal at the stovetop. Pt. presented with good balance, and no episodes of LOB.  Pt. did require cues for turning off the stove, hot pans , or burners. Pt. Was able to wash the dishes with supervision.

## 2015-07-17 ENCOUNTER — Encounter: Payer: Self-pay | Admitting: Physical Therapy

## 2015-07-17 ENCOUNTER — Encounter: Payer: Self-pay | Admitting: Speech Pathology

## 2015-07-17 NOTE — Therapy (Signed)
[place] MAIN [facility] SERVICES [address] [place], [place], [address] Phone: [phone]   Fax:  [phone]  Physical Therapy Treatment  Patient Details  Name: Natasha [name] MRN: [id number] Date of Birth: 1985/10/15 Referring Provider: Eulogio Bear  Encounter Date: 07/16/2015      PT End of Session - 07/16/15 0925    Visit Number 22   Number of Visits 25   Date for PT Re-Evaluation 07/05/15   PT Start Time 0215   PT Stop Time 0300   PT Time Calculation (min) 45 min   Equipment Utilized During Treatment Gait belt   Activity Tolerance Patient tolerated treatment well;No increased pain;Patient limited by fatigue      Past Medical History  Diagnosis Date  . Restless leg syndrome unk  . Polysubstance abuse     Past Surgical History  Procedure Laterality Date  . Cholecystectomy    . Tubal ligation      There were no vitals filed for this visit.  Visit Diagnosis:  Weakness generalized  Lack of coordination  Unsteady gait      Subjective Assessment - 07/16/15 0924    Subjective Patient says that she is feeling good today.     Currently in Pain? No/denies     Gait training with loft strand crutches on level and unlevel surfaces and on and off elevator for distances of over 1000 feet x 3. Leg press x 90 lbs x 20 x 3 sets, ankle PF x 60 lbs x 15 x 3 CGA and Min to mod verbal cues used throughout with increased in postural sway and LOB most seen with narrow base of support and while on uneven surfaces. Continues to have balance deficits typical with diagnosis. Patient performs intermediate level exercises without pain behaviors.                            PT Education - 07/17/15 [phone]    Education provided Yes   Education Details safety with loft strand crutches   Person(s) Educated Patient   Methods Explanation   Comprehension Verbalized understanding             PT Long Term Goals -  05/28/15 1619    PT LONG TERM GOAL #1   Title Patient will reduce timed up and go to <11 seconds to reduce fall risk and demonstrate improved transfer/gait ability.   Time 12   Period Weeks   Status Partially Met   PT LONG TERM GOAL #2   Title Patient will increase six minute walk test distance to >1000 for progression to community ambulator and improve gait ability   Time 12   Period Weeks   Status Partially Met   PT LONG TERM GOAL #3   Title Patient will increase 10 meter walk test to >1.86ms as to improve gait speed for better community ambulation and to reduce fall    Time 12   Period Weeks   Status Partially Met   PT LONG TERM GOAL #4   Title Patient will tolerate 5 seconds of single leg stance without loss of balance to improve ability to get in and out of shower safely   Time 12   Period Weeks   Status Partially Met   PT LONG TERM GOAL #5   Title Patient (< 648years old) will complete five times sit to stand test in < 10 seconds indicating an increased LE strength  and improved balance   Time 12   Period Weeks               Plan - 07/16/15 6962    Clinical Impression Statement Instruction with loft strand crutches on and off step and elevator and on level surfaces.   Pt will benefit from skilled therapeutic intervention in order to improve on the following deficits Abnormal gait;Decreased balance;Decreased endurance;Decreased mobility;Difficulty walking;Decreased cognition;Decreased safety awareness;Decreased strength;Impaired UE functional use;Obesity   Rehab Potential Good   PT Frequency 2x / week   PT Duration 12 weeks   PT Treatment/Interventions Therapeutic activities;Therapeutic exercise;Balance training;Neuromuscular re-education;Stair training;Gait training   PT Next Visit Plan strengthening and balance training   PT Home Exercise Plan balance in corner   Consulted and Agree with Plan of Care Patient        Problem List There are no active problems to  display for this patient. Alanson Puls, PT, DPT  [place], Minette Headland S 07/17/2015, 9:28 AM  [place] MAIN [facility] SERVICES [address] [place], [place], [address] Phone: [phone]   Fax:  [phone]  Name: Natasha [name] MRN: [id number] Date of Birth: 1985/07/14

## 2015-07-17 NOTE — Therapy (Signed)
[place] MAIN [facility] SERVICES [address] [place], [place], [address] Phone: [phone]   Fax:  [phone]  Speech Language Pathology Treatment  Patient Details  Name: Natasha [name] MRN: [id number] Date of Birth: Nov 24, 1985 Referring Provider: Dr. Janene Harvey  Encounter Date: 07/16/2015      End of Session - 07/17/15 1323    Visit Number 30   Number of Visits 49   Date for SLP Re-Evaluation 09/21/15   SLP Start Time 1500   SLP Stop Time  5993   SLP Time Calculation (min) 57 min   Activity Tolerance Patient tolerated treatment well      Past Medical History  Diagnosis Date  . Restless leg syndrome unk  . Polysubstance abuse     Past Surgical History  Procedure Laterality Date  . Cholecystectomy    . Tubal ligation      There were no vitals filed for this visit.  Visit Diagnosis: Cognitive communication deficit      Subjective Assessment - 07/17/15 1322    Subjective Patient returns with reports that she continues to improve with her thinking. She is working on her paper work for [employer] and is looking forward to her first meeting with her counselor.   Currently in Pain? No/denies               ADULT SLP TREATMENT - 07/17/15 0001    General Information   Behavior/Cognition Alert;Cooperative;Pleasant mood   HPI TBI   Treatment Provided   Treatment provided Cognitive-Linquistic   Pain Assessment   Pain Assessment No/denies pain   Cognitive-Linquistic Treatment   Treatment focused on Cognition   Skilled Treatment Patient was 90% accurate for completing logic puzzles that focused on attention to literal meaning with moderate support from the clinician. Patient was 80% accurate for solving logic problems that focused on scheduling with moderate support from clinician that included cues to attend to information relevant to the task at hand, and planning and organizational strategies. Patient was 70% accurate  for solving deductive reasoning problems that focused on making inferences related to a short passage with maximum support. Patient was 100% accurate for answering questions that probed for understanding of content related to a short passage with min support.   Assessment / Recommendations / Plan   Plan Continue with current plan of care   Progression Toward Goals   Progression toward goals Progressing toward goals          SLP Education - 07/17/15 1323    Education provided Yes   Education Details organizational and planning strategies for problem solving   Person(s) Educated Patient   Methods Explanation;Demonstration   Comprehension Verbalized understanding;Returned demonstration;Need further instruction            SLP Long Term Goals - 06/27/15 1317    SLP LONG TERM GOAL #1   Title Patient will demonstrate functional cognitive-communication skills for independent completion of personal responsibilities.   Status Partially Met   SLP LONG TERM GOAL #2   Title Patient will complete complex executive function skills tasks with 80% accuracy.   Status Partially Met   SLP LONG TERM GOAL #3   Title Patient will complete complex attention tasks with 80% accuracy.   Status Achieved   SLP LONG TERM GOAL #4   Title Patient will complete memory strategy activities with 80% accuracy.   Status Partially Met   SLP LONG TERM GOAL #5   Title Patient will generate grammatical and  cogent sentences to complete abstract/complex linguistic tasks with 80% accuracy   Status New          Plan - 07/17/15 1324    Clinical Impression Statement Patient continues to show improvement on cognitive thinking abilities as evidenced by her performance on logic puzzles and questions that probe for understanding of content. She had some difficulty today with questions that challenged her to make inferences from the information provided. Cues to write down key information and to be patient with herself were  helpful during today's session. Continue to review planning and organizational strategies with logic puzzles for next session. She reports she is still working on finishing her paper work for [employer] and that her next step will be to set up a meeting with her counselor.   Speech Therapy Frequency 2x / week   Duration Other (comment)  12 weeks   Treatment/Interventions Compensatory techniques;Cognitive reorganization;Internal/external aids;Functional tasks;SLP instruction and feedback;Compensatory strategies;Patient/family education   Potential to Achieve Goals Good   Potential Considerations Ability to learn/carryover information;Cooperation/participation level;Previous level of function;Severity of impairments;Family/community support   SLP Home Exercise Plan finish paper work for [employer] and make appointment   Consulted and Agree with Plan of Care Patient        Problem List There are no active problems to display for this patient.   Wynelle Cleveland 07/17/2015, 1:25 PM  [place] MAIN [facility] SERVICES [address] [place], [place], [address] Phone: [phone]   Fax:  [phone]   Name: LATISE DILLEY MRN: [id number] Date of Birth: 1985/11/20

## 2015-07-18 ENCOUNTER — Ambulatory Visit: Payer: 59 | Admitting: Speech Pathology

## 2015-07-18 ENCOUNTER — Encounter: Payer: Self-pay | Admitting: Physical Therapy

## 2015-07-18 ENCOUNTER — Encounter: Payer: Self-pay | Admitting: Speech Pathology

## 2015-07-18 ENCOUNTER — Ambulatory Visit: Payer: 59 | Admitting: Occupational Therapy

## 2015-07-18 ENCOUNTER — Ambulatory Visit: Payer: 59 | Admitting: Physical Therapy

## 2015-07-18 DIAGNOSIS — R41841 Cognitive communication deficit: Secondary | ICD-10-CM

## 2015-07-18 DIAGNOSIS — R531 Weakness: Secondary | ICD-10-CM

## 2015-07-18 DIAGNOSIS — R278 Other lack of coordination: Secondary | ICD-10-CM

## 2015-07-18 DIAGNOSIS — R2681 Unsteadiness on feet: Secondary | ICD-10-CM

## 2015-07-18 NOTE — Patient Instructions (Signed)
Pt. worked on grasping small 1/8" mini beads with her right hand,  and placing them on a wire.  Pt. worked on removing the beads with right alternating thumb to 2nd through 5th digits.

## 2015-07-18 NOTE — Therapy (Signed)
[place] MAIN [facility] SERVICES [address] [place], [place], [address] Phone: [phone]   Fax:  [phone]  Physical Therapy Treatment  Patient Details  Name: Natasha [name] MRN: [id number] Date of Birth: 1985-08-20 Referring Provider: Eulogio Bear  Encounter Date: 07/18/2015      PT End of Session - 07/18/15 1427    Visit Number 23   Number of Visits 25   Date for PT Re-Evaluation 07/05/15   PT Start Time 0215   PT Stop Time 0300   PT Time Calculation (min) 45 min   Equipment Utilized During Treatment Gait belt   Activity Tolerance Patient tolerated treatment well;No increased pain;Patient limited by fatigue      Past Medical History  Diagnosis Date  . Restless leg syndrome unk  . Polysubstance abuse     Past Surgical History  Procedure Laterality Date  . Cholecystectomy    . Tubal ligation      There were no vitals filed for this visit.  Visit Diagnosis:  Loss of coordination  Weakness generalized  Unsteady gait      Subjective Assessment - 07/18/15 1424    Subjective Patient is doing well.    Currently in Pain? No/denies     Therapeutic exercise: standing hip abd with YTB x 20  step ups from floor to 6 inch stool x 20 bilateral sit to stand x 10 x 2 Gait training with loft strand crutches x 1000 feet x 3 , ascending/descending 3 steps Patient needs occasional verbal cueing to improve posture and cueing to correctly perform exercises slowly, holding at end of range to increase motor firing of desired muscle to encourage fatigue.                             PT Education - 07/18/15 1425    Education provided Yes   Education Details review of adaptative equipment    Person(s) Educated Patient   Methods Explanation   Comprehension Verbalized understanding             PT Long Term Goals - 05/28/15 1619    PT LONG TERM GOAL #1   Title Patient will reduce timed up and go to  <11 seconds to reduce fall risk and demonstrate improved transfer/gait ability.   Time 12   Period Weeks   Status Partially Met   PT LONG TERM GOAL #2   Title Patient will increase six minute walk test distance to >1000 for progression to community ambulator and improve gait ability   Time 12   Period Weeks   Status Partially Met   PT LONG TERM GOAL #3   Title Patient will increase 10 meter walk test to >1.66ms as to improve gait speed for better community ambulation and to reduce fall    Time 12   Period Weeks   Status Partially Met   PT LONG TERM GOAL #4   Title Patient will tolerate 5 seconds of single leg stance without loss of balance to improve ability to get in and out of shower safely   Time 12   Period Weeks   Status Partially Met   PT LONG TERM GOAL #5   Title Patient (< 639years old) will complete five times sit to stand test in < 10 seconds indicating an increased LE strength and improved balance   Time 12   Period Weeks  Plan - 07/18/15 1428    Clinical Impression Statement Saftey with transfers and steps with equipment.   Pt will benefit from skilled therapeutic intervention in order to improve on the following deficits Abnormal gait;Decreased balance;Decreased endurance;Decreased mobility;Difficulty walking;Decreased cognition;Decreased safety awareness;Decreased strength;Impaired UE functional use;Obesity   Rehab Potential Good   PT Frequency 2x / week   PT Duration 12 weeks   PT Treatment/Interventions Therapeutic activities;Therapeutic exercise;Balance training;Neuromuscular re-education;Stair training;Gait training   PT Next Visit Plan strengthening and balance training   PT Home Exercise Plan balance in corner   Consulted and Agree with Plan of Care Patient        Problem List There are no active problems to display for this patient.  Alanson Puls, PT, DPT [place], [place] S 07/18/2015, 2:56 PM  [place] MAIN [facility] SERVICES [address] [place], [place], [address] Phone: [phone]   Fax:  [phone]  Name: Natasha [name] MRN: [id number] Date of Birth: 1985/08/12
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
## 2015-07-18 NOTE — Therapy (Signed)
[place] MAIN [facility] SERVICES [address] [place], [place], [address] Phone: [phone]   Fax:  [phone]  Speech Language Pathology Treatment  Patient Details  Name: Natasha [name] MRN: [id number] Date of Birth: [birth year] Referring Provider: Dr. Janene Harvey  Encounter Date: 07/18/2015      End of Session - 07/18/15 1626    Visit Number 31   Number of Visits 60   Date for SLP Re-Evaluation 09/21/15   SLP Start Time 1500   SLP Stop Time  1600   SLP Time Calculation (min) 60 min   Activity Tolerance Patient tolerated treatment well      Past Medical History  Diagnosis Date  . Restless leg syndrome unk  . Polysubstance abuse     Past Surgical History  Procedure Laterality Date  . Cholecystectomy    . Tubal ligation      There were no vitals filed for this visit.  Visit Diagnosis: Cognitive communication deficit      Subjective Assessment - 07/18/15 1625    Subjective Patient returns with reports that she continues to improve with her thinking.   Currently in Pain? No/denies               ADULT SLP TREATMENT - 07/18/15 0001    General Information   Behavior/Cognition Alert;Cooperative;Pleasant mood   HPI TBI   Treatment Provided   Treatment provided Cognitive-Linquistic   Cognitive-Linquistic Treatment   Treatment focused on Cognition   Skilled Treatment Patient was 80% accurate for completing logic puzzles that focused on attention to literal meaning with moderate support from the clinician. Patient was 70% accurate for solving deductive reasoning problems that focused on making inferences related to a short passage with maximum support. Patient was 60% accurate for answering questions that probed for understanding of content related to a short passage with min support. Patient was 90% accurate for solving logic problems that focused on homonym decoding.    Assessment / Recommendations / Plan   Plan Continue  with current plan of care   Progression Toward Goals   Progression toward goals Progressing toward goals          SLP Education - 07/18/15 1625    Education provided Yes   Education Details planning and organizational strategies for problem solving   Person(s) Educated Patient   Methods Explanation   Comprehension Verbalized understanding            SLP Long Term Goals - 06/27/15 1317    SLP LONG TERM GOAL #1   Title Patient will demonstrate functional cognitive-communication skills for independent completion of personal responsibilities.   Status Partially Met   SLP LONG TERM GOAL #2   Title Patient will complete complex executive function skills tasks with 80% accuracy.   Status Partially Met   SLP LONG TERM GOAL #3   Title Patient will complete complex attention tasks with 80% accuracy.   Status Achieved   SLP LONG TERM GOAL #4   Title Patient will complete memory strategy activities with 80% accuracy.   Status Partially Met   SLP LONG TERM GOAL #5   Title Patient will generate grammatical and cogent sentences to complete abstract/complex linguistic tasks with 80% accuracy   Status New          Plan - 07/18/15 1626    Clinical Impression Statement Patient continues to show improvement on cognitive thinking abilities as evidenced by her performance on logic puzzles and questions that probe for  understanding of content. She had some difficulty today with questions that challenged her to make inferences from the information provided. Cues to write down key information and to be patient with herself were helpful during today's session. Continue to review planning and organizational strategies with logic puzzles for next session. She reports she is still working on finishing her paper work for [employer] and that her next step will be to set up a meeting with her counselor. Today, the clinician encouraged her to bring the paper work to work on and to think about other  barriers there might be to her living independently.    Speech Therapy Frequency 2x / week   Duration Other (comment)  12 weeks   Treatment/Interventions Compensatory techniques;Cognitive reorganization;Internal/external aids;Functional tasks;SLP instruction and feedback;Compensatory strategies;Patient/family education   Potential to Achieve Goals Good   Potential Considerations Ability to learn/carryover information;Cooperation/participation level;Previous level of function;Severity of impairments;Family/community support   SLP Home Exercise Plan finish paper work for [employer] and make appointment   Consulted and Agree with Plan of Care Patient        Problem List There are no active problems to display for this patient.   Wynelle Cleveland 07/18/2015, 4:27 PM  [place] MAIN [facility] SERVICES [address] [place], [place], [address] Phone: [phone]   Fax:  [phone]   Name: Natasha [name] MRN: [id number] Date of Birth: 1985-10-03

## 2015-07-23 ENCOUNTER — Ambulatory Visit: Payer: 59 | Admitting: Physical Therapy

## 2015-07-23 ENCOUNTER — Ambulatory Visit: Payer: 59 | Admitting: Speech Pathology

## 2015-07-23 ENCOUNTER — Ambulatory Visit: Payer: 59 | Admitting: Occupational Therapy

## 2015-07-23 ENCOUNTER — Encounter: Payer: Self-pay | Admitting: Physical Therapy

## 2015-07-23 DIAGNOSIS — R278 Other lack of coordination: Secondary | ICD-10-CM

## 2015-07-23 DIAGNOSIS — Z789 Other specified health status: Secondary | ICD-10-CM

## 2015-07-23 DIAGNOSIS — M6281 Muscle weakness (generalized): Secondary | ICD-10-CM

## 2015-07-23 DIAGNOSIS — R41841 Cognitive communication deficit: Secondary | ICD-10-CM

## 2015-07-23 DIAGNOSIS — R4689 Other symptoms and signs involving appearance and behavior: Secondary | ICD-10-CM

## 2015-07-23 DIAGNOSIS — R2681 Unsteadiness on feet: Secondary | ICD-10-CM

## 2015-07-23 DIAGNOSIS — R531 Weakness: Secondary | ICD-10-CM

## 2015-07-23 NOTE — Patient Instructions (Signed)
Self-care  Pt. requires CGA to stand at the stove and cook bacon, and stand to wash dishes. Pt. requires forearm crutches. Pt. with no episodes of loss of balance. Pt. demonstrated good safety awareness and judgement while cooking , and preparing bacon. No cues for safety awareness were required as pt. performed the simple cooking task.
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
## 2015-07-23 NOTE — Therapy (Signed)
[place] MAIN [facility] SERVICES [place], [place], [address] Phone: [phone]   Fax:  [phone]  Physical Therapy Treatment  Patient Details  Name: Natasha [name] MRN: [id number] Date of Birth: 02/15/1986 Referring Provider: Eulogio Bear  Encounter Date: 07/23/2015      PT End of Session - 07/23/15 1450    Visit Number 24   Number of Visits 25   Date for PT Re-Evaluation 07/05/15   PT Start Time 0215   PT Stop Time 0300   PT Time Calculation (min) 45 min      Past Medical History  Diagnosis Date  . Restless leg syndrome unk  . Polysubstance abuse     Past Surgical History  Procedure Laterality Date  . Cholecystectomy    . Tubal ligation      There were no vitals filed for this visit.  Visit Diagnosis:  Unsteady gait  Loss of coordination  Weakness generalized      Subjective Assessment - 07/23/15 1448    Subjective Patient is doing well.    Currently in Pain? No/denies     Gait training with loft strand crutches on uneven ground with SBA x 1000 feet Ascending and descending steps with one loft strand crutch and 1 railing and with 2 loftstrand crutch and  Min assist. Therapeutic exercises: standing hip abd with YTB x 20  step ups from floor to 6 inch stool x 20 bilateral sit to stand x 10 marching in parallel bars x 20 Patient needs occasional verbal cueing to improve posture and cueing to correctly perform exercises slowly.                            PT Education - 07/23/15 1449    Education provided Yes   Education Details instruction in steps with loft strand crutches   Person(s) Educated Patient   Methods Explanation   Comprehension Verbalized understanding             PT Long Term Goals - 05/28/15 1619    PT LONG TERM GOAL #1   Title Patient will reduce timed up and go to <11 seconds to reduce fall risk and demonstrate improved transfer/gait ability.   Time 12   Period Weeks   Status Partially Met   PT LONG TERM GOAL #2   Title Patient will increase six minute walk test distance to >1000 for progression to community ambulator and improve gait ability   Time 12   Period Weeks   Status Partially Met   PT LONG TERM GOAL #3   Title Patient will increase 10 meter walk test to >1.50ms as to improve gait speed for better community ambulation and to reduce fall    Time 12   Period Weeks   Status Partially Met   PT LONG TERM GOAL #4   Title Patient will tolerate 5 seconds of single leg stance without loss of balance to improve ability to get in and out of shower safely   Time 12   Period Weeks   Status Partially Met   PT LONG TERM GOAL #5   Title Patient (< 62years old) will complete five times sit to stand test in < 10 seconds indicating an increased LE strength and improved balance   Time 12   Period Weeks               Plan - 07/23/15 1450  Clinical Impression Statement Patient was seen for instruction and saftey with loft strand cruches on steps and curbs and uneven ground.    Pt will benefit from skilled therapeutic intervention in order to improve on the following deficits Abnormal gait;Decreased balance;Decreased endurance;Decreased mobility;Difficulty walking;Decreased cognition;Decreased safety awareness;Decreased strength;Impaired UE functional use;Obesity   Rehab Potential Good   PT Frequency 2x / week   PT Duration 12 weeks   PT Treatment/Interventions Therapeutic activities;Therapeutic exercise;Balance training;Neuromuscular re-education;Stair training;Gait training   PT Next Visit Plan strengthening and balance training   PT Home Exercise Plan balance in corner   Consulted and Agree with Plan of Care Patient        Problem List There are no active problems to display for this patient.   Alanson Puls 07/23/2015, 2:54 PM  [place] MAIN [facility] SERVICES [address] [place], [place], [address] Phone: [phone]   Fax:  [phone]  Name: TESIA LYBRAND MRN: [id number] Date of Birth: [birth year]

## 2015-07-24 ENCOUNTER — Encounter: Payer: Self-pay | Admitting: Speech Pathology

## 2015-07-24 NOTE — Addendum Note (Signed)
Addended by: [name], [name] on: 07/24/2015 11:55 AM   Modules accepted: Orders

## 2015-07-24 NOTE — Therapy (Signed)
[place] MAIN [facility] SERVICES [address] [place], [place], [address] Phone: [phone]   Fax:  [phone]  Speech Language Pathology Treatment  Patient Details  Name: Natasha [name] MRN: [id number] Date of Birth: Aug 20, 1985 Referring Provider: Dr. Janene Harvey  Encounter Date: 07/23/2015      End of Session - 07/24/15 1316    Visit Number 32   Number of Visits 7   Date for SLP Re-Evaluation 09/21/15   SLP Start Time 1500   SLP Stop Time  1600   SLP Time Calculation (min) 60 min   Activity Tolerance Patient tolerated treatment well      Past Medical History  Diagnosis Date  . Restless leg syndrome unk  . Polysubstance abuse     Past Surgical History  Procedure Laterality Date  . Cholecystectomy    . Tubal ligation      There were no vitals filed for this visit.  Visit Diagnosis: Cognitive communication deficit      Subjective Assessment - 07/24/15 1315    Subjective Patient returns with reports that she continues to improve with her thinking.   Currently in Pain? No/denies               ADULT SLP TREATMENT - 07/24/15 0001    General Information   Behavior/Cognition Alert;Cooperative;Pleasant mood   HPI TBI   Treatment Provided   Treatment provided Cognitive-Linquistic   Pain Assessment   Pain Assessment No/denies pain   Cognitive-Linquistic Treatment   Treatment focused on Cognition   Skilled Treatment Patient was 70% accurate for solving deductive reasoning problems that focused on making inferences related to a short passage with maximum support. Patient was 90% accurate for answering questions that probed for understanding of content related to a short passage with min support.    Assessment / Recommendations / Plan   Plan Continue with current plan of care   Progression Toward Goals   Progression toward goals Progressing toward goals          SLP Education - 07/24/15 1315    Education provided  Yes   Education Details how to continue to make progress with vocational rehab in her job search   Person(s) Educated Patient   Methods Explanation   Comprehension Verbalized understanding            SLP Long Term Goals - 06/27/15 1317    SLP LONG TERM GOAL #1   Title Patient will demonstrate functional cognitive-communication skills for independent completion of personal responsibilities.   Status Partially Met   SLP LONG TERM GOAL #2   Title Patient will complete complex executive function skills tasks with 80% accuracy.   Status Partially Met   SLP LONG TERM GOAL #3   Title Patient will complete complex attention tasks with 80% accuracy.   Status Achieved   SLP LONG TERM GOAL #4   Title Patient will complete memory strategy activities with 80% accuracy.   Status Partially Met   SLP LONG TERM GOAL #5   Title Patient will generate grammatical and cogent sentences to complete abstract/complex linguistic tasks with 80% accuracy   Status New          Plan - 07/24/15 1316    Clinical Impression Statement Patient returns today with materials from Vocational Rehab (VR) for review. She had completed most of the paper work on her own and demonstrated understanding of the information provided and how to proceed. Her next task is to call VR  and set up an appointment with her counselor. Patient continues to show improvement on cognitive thinking abilities as evidenced by her understanding and completion of vocational rehabilitation paper work, performance on logic puzzles and questions that probe for understanding of content. She continues to have some difficulty with questions that challenge her to make inferences from information provided in short passages. Cues to write down key information and to be patient with herself were helpful during today's session.    Speech Therapy Frequency 2x / week   Duration Other (comment)  12 weeks   Treatment/Interventions Compensatory  techniques;Cognitive reorganization;Internal/external aids;Functional tasks;SLP instruction and feedback;Compensatory strategies;Patient/family education   Potential to Achieve Goals Good   Potential Considerations Ability to learn/carryover information;Cooperation/participation level;Previous level of function;Severity of impairments;Family/community support   SLP Home Exercise Plan finish paper work for VR and make appointment   Consulted and Agree with Plan of Care Patient        Problem List There are no active problems to display for this patient.   Wynelle Cleveland 07/24/2015, 1:17 PM  [place] MAIN [facility] SERVICES [address] [place], [place], [address] Phone: [phone]   Fax:  [phone]   Name: CHARLOTT CALVARIO MRN: [id number] Date of Birth: 09-12-1985

## 2015-07-25 ENCOUNTER — Encounter: Payer: Self-pay | Admitting: Physical Therapy

## 2015-07-25 ENCOUNTER — Ambulatory Visit: Payer: 59 | Admitting: Speech Pathology

## 2015-07-25 ENCOUNTER — Ambulatory Visit: Payer: 59 | Admitting: Physical Therapy

## 2015-07-25 ENCOUNTER — Ambulatory Visit: Payer: 59 | Admitting: Occupational Therapy

## 2015-07-25 DIAGNOSIS — R41841 Cognitive communication deficit: Secondary | ICD-10-CM | POA: Diagnosis not present

## 2015-07-25 DIAGNOSIS — R278 Other lack of coordination: Secondary | ICD-10-CM

## 2015-07-25 DIAGNOSIS — R2681 Unsteadiness on feet: Secondary | ICD-10-CM

## 2015-07-25 DIAGNOSIS — M6281 Muscle weakness (generalized): Secondary | ICD-10-CM

## 2015-07-25 NOTE — Therapy (Signed)
[place] MAIN [facility] SERVICES [place], [place], [address] Phone: [phone]   Fax:  [phone]  Physical Therapy Treatment  Patient Details  Name: Natasha [name] MRN: [id number] Date of Birth: November 08, 1985 Referring Provider: Eulogio Bear  Encounter Date: 07/25/2015      PT End of Session - 07/25/15 1505    Visit Number 25   Number of Visits 25   Date for PT Re-Evaluation 07/05/15   PT Start Time 0215   PT Stop Time 0300   PT Time Calculation (min) 45 min      Past Medical History  Diagnosis Date  . Restless leg syndrome unk  . Polysubstance abuse     Past Surgical History  Procedure Laterality Date  . Cholecystectomy    . Tubal ligation      There were no vitals filed for this visit.  Visit Diagnosis:  Muscle weakness (generalized)  Unsteady gait  Loss of coordination      Subjective Assessment - 07/25/15 1504    Subjective Patient is doing well.    Currently in Pain? No/denies     Gait training with 1 loft strand crutch in hall ways, door ways and level surfaces 1000 feet x 3 Gait training in parallel bars without UE with cues for posture and side stepping in parallel bars and CGA without UE support.  Patient needs occasional verbal cueing to improve posture and cueing to correctly perform gait slowly.                            PT Education - 07/25/15 1505    Education provided Yes   Education Details Safety with loft strand crutch   Person(s) Educated Patient   Methods Explanation   Comprehension Verbalized understanding             PT Long Term Goals - 05/28/15 1619    PT LONG TERM GOAL #1   Title Patient will reduce timed up and go to <11 seconds to reduce fall risk and demonstrate improved transfer/gait ability.   Time 12   Period Weeks   Status Partially Met   PT LONG TERM GOAL #2   Title Patient will increase six minute walk test distance to >1000 for  progression to community ambulator and improve gait ability   Time 12   Period Weeks   Status Partially Met   PT LONG TERM GOAL #3   Title Patient will increase 10 meter walk test to >1.27ms as to improve gait speed for better community ambulation and to reduce fall    Time 12   Period Weeks   Status Partially Met   PT LONG TERM GOAL #4   Title Patient will tolerate 5 seconds of single leg stance without loss of balance to improve ability to get in and out of shower safely   Time 12   Period Weeks   Status Partially Met   PT LONG TERM GOAL #5   Title Patient (< 658years old) will complete five times sit to stand test in < 10 seconds indicating an increased LE strength and improved balance   Time 12   Period Weeks               Plan - 07/25/15 1506    Clinical Impression Statement Patient  performed walking in parallel bars wihtout Ue support and dynaic standing balance exercises.   Pt will benefit  from skilled therapeutic intervention in order to improve on the following deficits Abnormal gait;Decreased balance;Decreased endurance;Decreased mobility;Difficulty walking;Decreased cognition;Decreased safety awareness;Decreased strength;Impaired UE functional use;Obesity   Rehab Potential Good   PT Frequency 2x / week   PT Duration 12 weeks   PT Treatment/Interventions Therapeutic activities;Therapeutic exercise;Balance training;Neuromuscular re-education;Stair training;Gait training   PT Next Visit Plan strengthening and balance training   PT Home Exercise Plan balance in corner   Consulted and Agree with Plan of Care Patient        Problem List There are no active problems to display for this patient. Alanson Puls, PT, DPT  [place], Minette Headland S 07/25/2015, 3:08 PM  [place] MAIN [facility] SERVICES [address] [place], [place], [address] Phone: [phone]   Fax:  [phone]  Name: CAYLYNN MINCHEW MRN:  [id number] Date of Birth: 01-Oct-1985

## 2015-07-25 NOTE — Patient Instructions (Signed)
OT TREATMENT  Therapeutic Activities:  Pt. worked on Orthoptistcopying and building PCP Insurance claims handlerpipe designs following a Chief Executive Officerprinted handout. Pt. worked on progressively building more Production managercomplex designs. Pt. utilized bilateral cuff weights when reaching while building the design working in a vertical plane. Pt. Completed 4 designs with cues.
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
## 2015-07-26 ENCOUNTER — Encounter: Payer: Self-pay | Admitting: Speech Pathology

## 2015-07-26 NOTE — Therapy (Signed)
[place] MAIN [facility] SERVICES [address] [place], [place], [address] Phone: [phone]   Fax:  [phone]  Speech Language Pathology Treatment  Patient Details  Name: Natasha [name] MRN: [id number] Date of Birth: 11/08/1985 Referring Provider: Dr. Janene Harvey  Encounter Date: 07/25/2015      End of Session - 07/26/15 1437    Visit Number 33   Number of Visits 71   Date for SLP Re-Evaluation 09/21/15   SLP Start Time 71   SLP Stop Time  1500   SLP Time Calculation (min) 60 min   Activity Tolerance Patient tolerated treatment well      Past Medical History  Diagnosis Date  . Restless leg syndrome unk  . Polysubstance abuse     Past Surgical History  Procedure Laterality Date  . Cholecystectomy    . Tubal ligation      There were no vitals filed for this visit.  Visit Diagnosis: Cognitive communication deficit      Subjective Assessment - 07/26/15 1437    Subjective Patient returns with reports that she continues to improve with her thinking.   Currently in Pain? No/denies               ADULT SLP TREATMENT - 07/26/15 0001    General Information   Behavior/Cognition Alert;Cooperative;Pleasant mood   HPI TBI   Treatment Provided   Treatment provided Cognitive-Linquistic   Pain Assessment   Pain Assessment No/denies pain   Cognitive-Linquistic Treatment   Treatment focused on Cognition   Skilled Treatment Patient was 70% accurate for solving deductive reasoning problems that focused on making inferences related to a short passage with maximum support. Patient was 90% accurate for answering questions that probed for understanding of content related to a short passage with min support. Patient was 90% accurate for identifying the correct verbal analogy in a group of four with minimum support.   Assessment / Recommendations / Plan   Plan Continue with current plan of care   Progression Toward Goals   Progression  toward goals Progressing toward goals          SLP Education - 07/26/15 1437    Education provided Yes   Education Details planning and organizational strategies for problem solving   Person(s) Educated Patient   Methods Explanation;Demonstration   Comprehension Verbalized understanding;Returned demonstration            SLP Long Term Goals - 06/27/15 1317    SLP LONG TERM GOAL #1   Title Patient will demonstrate functional cognitive-communication skills for independent completion of personal responsibilities.   Status Partially Met   SLP LONG TERM GOAL #2   Title Patient will complete complex executive function skills tasks with 80% accuracy.   Status Partially Met   SLP LONG TERM GOAL #3   Title Patient will complete complex attention tasks with 80% accuracy.   Status Achieved   SLP LONG TERM GOAL #4   Title Patient will complete memory strategy activities with 80% accuracy.   Status Partially Met   SLP LONG TERM GOAL #5   Title Patient will generate grammatical and cogent sentences to complete abstract/complex linguistic tasks with 80% accuracy   Status New          Plan - 07/26/15 1439    Clinical Impression Statement Patient returns today with reports that she has made several attempts to schedule an appointment with vocational rehab and is confident she will be able to secure an  appointment in the near future. Patient continues to show improvement on cognitive thinking abilities as evidenced by her understanding and completion of verbal analogy word problems and questions that challenge her to make inferences from information provided.    Speech Therapy Frequency 2x / week   Duration Other (comment)  12 weeks   Treatment/Interventions Compensatory techniques;Cognitive reorganization;Internal/external aids;Functional tasks;SLP instruction and feedback;Compensatory strategies;Patient/family education   Potential to Achieve Goals Good   Potential Considerations  Ability to learn/carryover information;Cooperation/participation level;Previous level of function;Severity of impairments;Family/community support   SLP Home Exercise Plan finish paper work for VR and make appointment   Consulted and Agree with Plan of Care Patient        Problem List There are no active problems to display for this patient.   Wynelle Cleveland 07/26/2015, 2:40 PM  [place] MAIN [facility] SERVICES [address] [place], [place], [address] Phone: [phone]   Fax:  [phone]   Name: Natasha [name] MRN: [id number] Date of Birth: [birth year]

## 2015-07-30 ENCOUNTER — Ambulatory Visit: Payer: Commercial Managed Care - HMO | Attending: Pediatrics | Admitting: Speech Pathology

## 2015-07-30 ENCOUNTER — Ambulatory Visit: Payer: Commercial Managed Care - HMO | Admitting: Physical Therapy

## 2015-07-30 ENCOUNTER — Encounter: Payer: Self-pay | Admitting: Speech Pathology

## 2015-07-30 ENCOUNTER — Encounter: Payer: Self-pay | Admitting: Physical Therapy

## 2015-07-30 DIAGNOSIS — R270 Ataxia, unspecified: Secondary | ICD-10-CM | POA: Diagnosis present

## 2015-07-30 DIAGNOSIS — M6281 Muscle weakness (generalized): Secondary | ICD-10-CM

## 2015-07-30 DIAGNOSIS — R278 Other lack of coordination: Secondary | ICD-10-CM | POA: Diagnosis present

## 2015-07-30 DIAGNOSIS — R41841 Cognitive communication deficit: Secondary | ICD-10-CM | POA: Insufficient documentation

## 2015-07-30 DIAGNOSIS — R2681 Unsteadiness on feet: Secondary | ICD-10-CM | POA: Diagnosis present

## 2015-07-30 DIAGNOSIS — R262 Difficulty in walking, not elsewhere classified: Secondary | ICD-10-CM | POA: Diagnosis present

## 2015-07-30 DIAGNOSIS — R279 Unspecified lack of coordination: Secondary | ICD-10-CM | POA: Diagnosis present

## 2015-07-30 NOTE — Therapy (Signed)
[place] MAIN [facility] SERVICES [place], [place], [address] Phone: [phone]   Fax:  [phone]  Speech Language Pathology Treatment  Patient Details  Name: Natasha [name] MRN: [id number] Date of Birth: [birth year] Referring Provider: Dr. Janene Harvey  Encounter Date: 07/30/2015      End of Session - 07/30/15 1520    Visit Number 34   Number of Visits 49   Date for SLP Re-Evaluation 09/21/15   SLP Start Time 8309   SLP Stop Time  1500   SLP Time Calculation (min) 55 min   Activity Tolerance Patient tolerated treatment well      Past Medical History  Diagnosis Date  . Restless leg syndrome unk  . Polysubstance abuse     Past Surgical History  Procedure Laterality Date  . Cholecystectomy    . Tubal ligation      There were no vitals filed for this visit.  Visit Diagnosis: Cognitive communication deficit      Subjective Assessment - 07/30/15 1519    Subjective Patient returns with reports that she continues to improve with her thinking.   Currently in Pain? No/denies               ADULT SLP TREATMENT - 07/30/15 0001    General Information   Behavior/Cognition Alert;Cooperative;Pleasant mood   HPI TBI   Treatment Provided   Treatment provided Cognitive-Linquistic   Pain Assessment   Pain Assessment No/denies pain   Cognitive-Linquistic Treatment   Treatment focused on Cognition   Skilled Treatment Patient was 75% accurate for solving logic puzzles that focused planning, organization, and making inferences with mod cues. Patient was 70% accurate for solving deductive reasoning problems that focused on making inferences related to a short passage with maximum support. Patient was 90% accurate for answering questions that probed for understanding of content related to a short passage with min support.    Assessment / Recommendations / Plan   Plan Continue with current plan of care   Progression Toward  Goals   Progression toward goals Progressing toward goals          SLP Education - 07/30/15 1519    Education provided Yes   Education Details planning and organizational strategies for problem solving   Person(s) Educated Patient   Methods Explanation;Demonstration   Comprehension Verbalized understanding;Returned demonstration            SLP Long Term Goals - 06/27/15 1317    SLP LONG TERM GOAL #1   Title Patient will demonstrate functional cognitive-communication skills for independent completion of personal responsibilities.   Status Partially Met   SLP LONG TERM GOAL #2   Title Patient will complete complex executive function skills tasks with 80% accuracy.   Status Partially Met   SLP LONG TERM GOAL #3   Title Patient will complete complex attention tasks with 80% accuracy.   Status Achieved   SLP LONG TERM GOAL #4   Title Patient will complete memory strategy activities with 80% accuracy.   Status Partially Met   SLP LONG TERM GOAL #5   Title Patient will generate grammatical and cogent sentences to complete abstract/complex linguistic tasks with 80% accuracy   Status New          Plan - 07/30/15 1520    Clinical Impression Statement Patient returns today with reports that she has made an appointment with vocational rehab for next Monday, April 10. Patient continues to show improvement on cognitive thinking  abilities as evidenced by her understanding and completion of logic puzzles and questions that challenge her to make inferences from information provided.    Speech Therapy Frequency 2x / week   Duration Other (comment)  12 weeks   Treatment/Interventions Compensatory techniques;Cognitive reorganization;Internal/external aids;Functional tasks;SLP instruction and feedback;Compensatory strategies;Patient/family education   Potential to Achieve Goals Good   Potential Considerations Ability to learn/carryover information;Cooperation/participation level;Previous  level of function;Severity of impairments;Family/community support   SLP Home Exercise Plan identify barriers for ADLs   Consulted and Agree with Plan of Care Patient        Problem List There are no active problems to display for this patient.   Wynelle Cleveland 07/30/2015, 3:21 PM  [place] [facility] MAIN [facility] SERVICES [address] [place], [place], [address] Phone: [phone]   Fax:  [phone]   Name: ASLYNN BRUNETTI MRN: [id number] Date of Birth: [birth year]

## 2015-07-30 NOTE — Therapy (Signed)
[place] MAIN [facility] SERVICES [place], [place], [address] Phone: [phone]   Fax:  [phone]  Physical Therapy Treatment  Patient Details  Name: Natasha [name] MRN: [id number] Date of Birth: 08/05/1985 Referring Provider: Eulogio Bear  Encounter Date: 07/30/2015      PT End of Session - 07/30/15 1630    Visit Number 26   Number of Visits 25   Date for PT Re-Evaluation 07/05/15   PT Start Time 0300   PT Stop Time 0325   PT Time Calculation (min) 25 min      Past Medical History  Diagnosis Date  . Restless leg syndrome unk  . Polysubstance abuse     Past Surgical History  Procedure Laterality Date  . Cholecystectomy    . Tubal ligation      There were no vitals filed for this visit.  Visit Diagnosis:  Other lack of coordination  Muscle weakness (generalized)      Subjective Assessment - 07/30/15 1629    Subjective Patient is doing well. She has an appointment today at 3:30.   Currently in Pain? No/denies      Squats x 10 x 2 Heel raises x 10 x 2. Leg press x 90 lbs x 15 x 3 Patient needs occasional verbal cueing to improve posture and cueing to correctly perform exercises slowly, holding at end of range to increase motor firing of desired muscle to encourage fatigue.                             PT Education - 07/30/15 1629    Education provided Yes   Education Details safety with loft strand crutches   Person(s) Educated Patient   Methods Explanation   Comprehension Verbalized understanding             PT Long Term Goals - 05/28/15 1619    PT LONG TERM GOAL #1   Title Patient will reduce timed up and go to <11 seconds to reduce fall risk and demonstrate improved transfer/gait ability.   Time 12   Period Weeks   Status Partially Met   PT LONG TERM GOAL #2   Title Patient will increase six minute walk test distance to >1000 for progression to community ambulator  and improve gait ability   Time 12   Period Weeks   Status Partially Met   PT LONG TERM GOAL #3   Title Patient will increase 10 meter walk test to >1.60ms as to improve gait speed for better community ambulation and to reduce fall    Time 12   Period Weeks   Status Partially Met   PT LONG TERM GOAL #4   Title Patient will tolerate 5 seconds of single leg stance without loss of balance to improve ability to get in and out of shower safely   Time 12   Period Weeks   Status Partially Met   PT LONG TERM GOAL #5   Title Patient (< 634years old) will complete five times sit to stand test in < 10 seconds indicating an increased LE strength and improved balance   Time 12   Period Weeks               Plan - 07/30/15 1630    Clinical Impression Statement Patient is able to perfom all exercises wihtout pain behaviors.    Pt will benefit from skilled therapeutic intervention in  order to improve on the following deficits Abnormal gait;Decreased balance;Decreased endurance;Decreased mobility;Difficulty walking;Decreased cognition;Decreased safety awareness;Decreased strength;Impaired UE functional use;Obesity   Rehab Potential Good   PT Frequency 2x / week   PT Duration 12 weeks   PT Treatment/Interventions Therapeutic activities;Therapeutic exercise;Balance training;Neuromuscular re-education;Stair training;Gait training   PT Next Visit Plan strengthening and balance training   PT Home Exercise Plan balance in corner   Consulted and Agree with Plan of Care Patient        Problem List There are no active problems to display for this patient. [name] Mansfield, PT, DPT  [place], [name] 07/30/2015, 4:41 PM  [place] [facility] MA[facility] REHAB SERVICES [address] [place], [place], [address] Phone: [phone]   Fax:  [phone]  Name: Natasha [name] MRN: [id number] Date of Birth: 07/24/1985     

## 2015-08-01 ENCOUNTER — Encounter: Payer: Self-pay | Admitting: Physical Therapy

## 2015-08-01 ENCOUNTER — Ambulatory Visit: Payer: Commercial Managed Care - HMO | Admitting: Physical Therapy

## 2015-08-01 ENCOUNTER — Ambulatory Visit: Payer: Commercial Managed Care - HMO | Admitting: Occupational Therapy

## 2015-08-01 ENCOUNTER — Ambulatory Visit: Payer: Commercial Managed Care - HMO | Admitting: Speech Pathology

## 2015-08-01 DIAGNOSIS — R278 Other lack of coordination: Secondary | ICD-10-CM

## 2015-08-01 DIAGNOSIS — R41841 Cognitive communication deficit: Secondary | ICD-10-CM

## 2015-08-01 DIAGNOSIS — R2681 Unsteadiness on feet: Secondary | ICD-10-CM

## 2015-08-01 DIAGNOSIS — M6281 Muscle weakness (generalized): Secondary | ICD-10-CM
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
## 2015-08-01 NOTE — Therapy (Signed)
[facility] MA[facility] REH[facility] SERVICES [address] [place], [place], [address] Phone: [phone]   Fax:  [phone]  Physical Therapy Treatment  Patient Details  Name: Natasha [name] MRN: [id number] Date of Birth: 12/30/1985 Referring Provider: White elizabeth  Encounter Date: 08/01/2015      PT End of Session - 08/01/15 1503    Visit Number 27   Number of Visits 25   Date for PT Re-Evaluation 07/05/15   PT Start Time 0300   PT Stop Time 0345   PT Time Calculation (min) 45 min      Past Medical History  Diagnosis Date  . Restless leg syndrome unk  . Polysubstance abuse     Past Surgical History  Procedure Laterality Date  . Cholecystectomy    . Tubal ligation      There were no vitals filed for this visit.  Visit Diagnosis:  Muscle weakness (generalized)  Unsteady gait  Loss of coordination      Subjective Assessment - 08/01/15 1502    Subjective Patient is doing well. .     Gait training without loft strand crutches in parallel bars and with CGA in hallway 1000 feet x 3 with no head movement Gait training in  Hall way near the wall and CGA with head turns and loss of balance Gait training in parallel bars with head turns 500 feet with multiple loss of balance and use of UE for support  Side stepping in parallel bars Tapping 6 inch stool x 20 x 2 Step ups to 6 inch stool x 20 Patient needs occasional verbal cueing to improve posture and cueing to correctly perform exercises slowly, holding at end of range to increase motor firing of desired muscle to encourage fatigue.                              PT Education - 08/01/15 1503    Education provided Yes   Education Details standing balance trianing   Person(s) Educated Patient   Comprehension Verbalized understanding             PT Long Term Goals - 05/28/15 1619    PT LONG TERM GOAL #1   Title Patient will reduce timed up and go to <11  seconds to reduce fall risk and demonstrate improved transfer/gait ability.   Time 12   Period Weeks   Status Partially Met   PT LONG TERM GOAL #2   Title Patient will increase six minute walk test distance to >1000 for progression to community ambulator and improve gait ability   Time 12   Period Weeks   Status Partially Met   PT LONG TERM GOAL #3   Title Patient will increase 10 meter walk test to >1.0m/s as to improve gait speed for better community ambulation and to reduce fall    Time 12   Period Weeks   Status Partially Met   PT LONG TERM GOAL #4   Title Patient will tolerate 5 seconds of single leg stance without loss of balance to improve ability to get in and out of shower safely   Time 12   Period Weeks   Status Partially Met   PT LONG TERM GOAL #5   Title Patient (< 60 years old) will complete five times sit to stand test in < 10 seconds indicating an increased LE strength and improved balance   Time 12     Period Weeks               Plan - 08/01/15 1503    Clinical Impression Statement Patient is able to ambulate without UE support in parallel bars and dynamic standing balance to reach goals.    Pt will benefit from skilled therapeutic intervention in order to improve on the following deficits Abnormal gait;Decreased balance;Decreased endurance;Decreased mobility;Difficulty walking;Decreased cognition;Decreased safety awareness;Decreased strength;Impaired UE functional use;Obesity   Rehab Potential Good   PT Frequency 2x / week   PT Duration 12 weeks   PT Treatment/Interventions Therapeutic activities;Therapeutic exercise;Balance training;Neuromuscular re-education;Stair training;Gait training   PT Next Visit Plan strengthening and balance training   PT Home Exercise Plan balance in corner   Consulted and Agree with Plan of Care Patient        Problem List There are no active problems to display for this patient.  [name] Mansfield, PT, DPT [place],  [name] 08/01/2015, 3:05 PM  [place] [facility] MA[facility] REH[facility] SERVICES [address] [place], [place], [address] Phone: [phone]   Fax:  [phone]  Name: Rylah B Aldape MRN: [id number] Date of Birth: 09/26/1985     

## 2015-08-01 NOTE — Patient Instructions (Signed)
OT TREATMENT     Neuro muscular re-education: Pt. Worked on Biomedical scientistsmall parquetry designs with blocks. Pt. Was able to grasp, and sort the small design blocks with her right hand from the box. Pt. Worked on completing designs in varying degrees of difficulty.  Therapeutic Exercise: Pt. performed gross gripping with grip strengthener. Pt. worked on sustaining grip while grasping pegs and reaching at various angle with the right hand. Pt. Also worked on gripping with the left hand, and place th pegs in at tabletop height.  Selfcare:  Manual Therapy:

## 2015-08-02 ENCOUNTER — Encounter: Payer: Self-pay | Admitting: Speech Pathology

## 2015-08-02 NOTE — Therapy (Signed)
[place] MAIN [facility] SERVICES [address] [place], [place], [address] Phone: [phone]   Fax:  [phone]  Speech Language Pathology Treatment  Patient Details  Name: Natasha [name] MRN: [id number] Date of Birth: Jun 18, 1985 Referring Provider: Dr. Janene Harvey  Encounter Date: 08/01/2015      End of Session - 08/02/15 1007    Visit Number 35   Number of Visits 75   Date for SLP Re-Evaluation 09/21/15   SLP Start Time 43   SLP Stop Time  1459   SLP Time Calculation (min) 57 min   Activity Tolerance Patient tolerated treatment well      Past Medical History  Diagnosis Date  . Restless leg syndrome unk  . Polysubstance abuse     Past Surgical History  Procedure Laterality Date  . Cholecystectomy    . Tubal ligation      There were no vitals filed for this visit.  Visit Diagnosis: Cognitive communication deficit      Subjective Assessment - 08/02/15 1007    Subjective Patient returns with reports that she continues to improve with her thinking.   Currently in Pain? No/denies               ADULT SLP TREATMENT - 08/02/15 0001    General Information   Behavior/Cognition Alert;Cooperative;Pleasant mood   HPI TBI   Treatment Provided   Treatment provided Cognitive-Linquistic   Pain Assessment   Pain Assessment No/denies pain   Cognitive-Linquistic Treatment   Treatment focused on Cognition   Skilled Treatment Patient was 75% accurate for solving logic puzzles that focused on planning, organization, and making inferences with mod cues from clinician that included cues to utilize organizational and planning strategies, attention to task for maintaining place, and cues to remember details of information. Patient was 65% accurate for solving deductive reasoning problems that focused on making inferences related to a short passage with maximum support. Patient was 90% accurate for answering questions that probed for  understanding of content related to a short passage with min support.    Assessment / Recommendations / Plan   Plan Continue with current plan of care   Progression Toward Goals   Progression toward goals Progressing toward goals          SLP Education - 08/02/15 1007    Education provided Yes   Education Details planning and organizational strategies for problem solving   Person(s) Educated Patient   Methods Explanation;Demonstration   Comprehension Verbalized understanding;Returned demonstration            SLP Long Term Goals - 06/27/15 1317    SLP LONG TERM GOAL #1   Title Patient will demonstrate functional cognitive-communication skills for independent completion of personal responsibilities.   Status Partially Met   SLP LONG TERM GOAL #2   Title Patient will complete complex executive function skills tasks with 80% accuracy.   Status Partially Met   SLP LONG TERM GOAL #3   Title Patient will complete complex attention tasks with 80% accuracy.   Status Achieved   SLP LONG TERM GOAL #4   Title Patient will complete memory strategy activities with 80% accuracy.   Status Partially Met   SLP LONG TERM GOAL #5   Title Patient will generate grammatical and cogent sentences to complete abstract/complex linguistic tasks with 80% accuracy   Status New          Plan - 08/02/15 1008    Clinical Impression Statement Patient returns  today with reports that she is looking forward to her appointment with vocational rehab next Thursday, April 13. Patient identified information relevant to her upcoming appointment that she needs to obtain, including information about any outstanding legal concerns. Clinician counseled Patient on how to obtain said information. Patient continues to show improvement on cognitive thinking abilities as evidenced by her understanding and completion of logic puzzles and questions that challenge her to make inferences from information provided.    Speech  Therapy Frequency 2x / week   Duration Other (comment)  12 weeks   Treatment/Interventions Compensatory techniques;Cognitive reorganization;Internal/external aids;Functional tasks;SLP instruction and feedback;Compensatory strategies;Patient/family education   Potential to Achieve Goals Good   Potential Considerations Ability to learn/carryover information;Cooperation/participation level;Previous level of function;Severity of impairments;Family/community support   SLP Home Exercise Plan identify barriers for ADLs   Consulted and Agree with Plan of Care Patient        Problem List There are no active problems to display for this patient.   Wynelle Cleveland 08/02/2015, 10:09 AM  [place] MAIN [facility] SERVICES [address] [place], [place], [address] Phone: [phone]   Fax:  [phone]   Name: Natasha [name] MRN: [id number] Date of Birth: 1985-09-25

## 2015-08-06 ENCOUNTER — Ambulatory Visit: Payer: Commercial Managed Care - HMO | Admitting: Occupational Therapy

## 2015-08-06 ENCOUNTER — Encounter: Payer: Self-pay | Admitting: Physical Therapy

## 2015-08-06 ENCOUNTER — Encounter: Payer: Self-pay | Admitting: Speech Pathology

## 2015-08-06 ENCOUNTER — Ambulatory Visit: Payer: Commercial Managed Care - HMO | Admitting: Physical Therapy

## 2015-08-06 ENCOUNTER — Ambulatory Visit: Payer: Commercial Managed Care - HMO | Admitting: Speech Pathology

## 2015-08-06 DIAGNOSIS — R41841 Cognitive communication deficit: Secondary | ICD-10-CM

## 2015-08-06 DIAGNOSIS — M6281 Muscle weakness (generalized): Secondary | ICD-10-CM

## 2015-08-06 DIAGNOSIS — R2681 Unsteadiness on feet: Secondary | ICD-10-CM

## 2015-08-06 DIAGNOSIS — R279 Unspecified lack of coordination: Secondary | ICD-10-CM

## 2015-08-06 DIAGNOSIS — R278 Other lack of coordination: Secondary | ICD-10-CM

## 2015-08-06 NOTE — Therapy (Signed)
[place] MAIN [facility] SERVICES [place], [place], [address] Phone: [phone]   Fax:  [phone]  Physical Therapy Treatment  Patient Details  Name: Natasha [name] MRN: [id number] Date of Birth: 01-24-1986 Referring Provider: Eulogio Bear  Encounter Date: 08/06/2015      PT End of Session - 08/06/15 1642    Visit Number 28   Number of Visits 25   Date for PT Re-Evaluation 07/05/15   PT Start Time 2951   PT Stop Time 1638   PT Time Calculation (min) 45 min   Equipment Utilized During Treatment Gait belt   Activity Tolerance Patient tolerated treatment well   Behavior During Therapy [facility] for tasks assessed/performed      Past Medical History  Diagnosis Date  . Restless leg syndrome unk  . Polysubstance abuse     Past Surgical History  Procedure Laterality Date  . Cholecystectomy    . Tubal ligation      There were no vitals filed for this visit.      Subjective Assessment - 08/06/15 1600    Subjective Patient is doing well. She has had no falls since last session.    Currently in Pain? No/denies      Warm up: Nustep L4 x 5 min (unbilled)  Balance: Forward walking in parallel bars without UE support Side stepping in parallel bars with out UE support Step over and back with half foam roll (pre gait) without UE support Step up/back and L/R without UE support x10 each Cues for increased step height to improve foot clearance and safety, postural correction. Matrix resisted walking with 2.5# F/B with min A; cues for weight shifting, heel to toe and increase step length and BOS for increased stability.    Gait: Ambulation with B Loft strand crutches x 1000 ft with CGA with HT for dynamic gait. Demonstrated four point gait. Mod cues for heel to toe gait pattern for improved stability and progression.   Therex: Standing hip flexion, abd, and ext with 5# 2x10 each Cues for slow controlled eccentric  contractions and postural correction for proper technique.                           PT Education - 08/06/15 1638    Education provided Yes   Education Details heel to toe gait with all ambulation; pacing herself to increase safety   Person(s) Educated Patient   Methods Explanation;Demonstration   Comprehension Verbalized understanding;Returned demonstration             PT Long Term Goals - 05/28/15 1619    PT LONG TERM GOAL #1   Title Patient will reduce timed up and go to <11 seconds to reduce fall risk and demonstrate improved transfer/gait ability.   Time 12   Period Weeks   Status Partially Met   PT LONG TERM GOAL #2   Title Patient will increase six minute walk test distance to >1000 for progression to community ambulator and improve gait ability   Time 12   Period Weeks   Status Partially Met   PT LONG TERM GOAL #3   Title Patient will increase 10 meter walk test to >1.68ms as to improve gait speed for better community ambulation and to reduce fall    Time 12   Period Weeks   Status Partially Met   PT LONG TERM GOAL #4   Title Patient will tolerate 5 seconds  of single leg stance without loss of balance to improve ability to get in and out of shower safely   Time 12   Period Weeks   Status Partially Met   PT LONG TERM GOAL #5   Title Patient (< 26 years old) will complete five times sit to stand test in < 10 seconds indicating an increased LE strength and improved balance   Time 12   Period Weeks               Plan - 08/06/15 1643    Clinical Impression Statement Pt gradually improving in ambulation with decreased AD needed, as well as overall strength and balance. Coordination remains to be a limitation but she is able to improve with cues during functional mobility tasks. Min A for dnamic balance tasks required. She will benefit from continued gait training with LRAD focusing on heel to toe pattern and proper weight shifting, higher  level balance and strengthening exercises.   Rehab Potential Good   PT Frequency 2x / week   PT Duration 12 weeks   PT Treatment/Interventions Therapeutic activities;Therapeutic exercise;Balance training;Neuromuscular re-education;Stair training;Gait training   PT Next Visit Plan strengthening and balance training; gait training   PT Home Exercise Plan balance in corner   Consulted and Agree with Plan of Care Patient      Patient will benefit from skilled therapeutic intervention in order to improve the following deficits and impairments:  Abnormal gait, Decreased balance, Decreased endurance, Decreased mobility, Difficulty walking, Decreased cognition, Decreased safety awareness, Decreased strength, Impaired UE functional use, Obesity  Visit Diagnosis: Unsteady gait  Muscle weakness (generalized)  Lack of coordination     Problem List There are no active problems to display for this patient.   Nabeeha Badertscher Shiela Mayer, PT, DPT  08/06/2015, 4:51 PM [place] MAIN [facility] SERVICES [address] [place], [place], [address] Phone: [phone]   Fax:  [phone]  Name: Natasha [name] MRN: [id number] Date of Birth: [birth year]

## 2015-08-06 NOTE — Therapy (Signed)
[place] MAIN [facility] SERVICES [place], [place], [address] Phone: [phone]   Fax:  [phone]  Speech Language Pathology Treatment / Progress Note  Patient Details  Name: Natasha [name] MRN: [id number] Date of Birth: 01-Dec-1985 Referring Provider: Dr. Janene Harvey  Encounter Date: 08/06/2015      End of Session - 08/06/15 1553    Visit Number 36   Number of Visits 49   Date for SLP Re-Evaluation 09/21/15   SLP Start Time 5993   SLP Stop Time  1500   SLP Time Calculation (min) 62 min   Activity Tolerance Patient tolerated treatment well      Past Medical History  Diagnosis Date  . Restless leg syndrome unk  . Polysubstance abuse     Past Surgical History  Procedure Laterality Date  . Cholecystectomy    . Tubal ligation      There were no vitals filed for this visit.      Subjective Assessment - 08/06/15 1553    Subjective Patient returns with reports that she continues to improve with her thinking.   Currently in Pain? No/denies               ADULT SLP TREATMENT - 08/06/15 0001    General Information   Behavior/Cognition Alert;Cooperative;Pleasant mood   HPI TBI   Treatment Provided   Treatment provided Cognitive-Linquistic   Pain Assessment   Pain Assessment No/denies pain   Cognitive-Linquistic Treatment   Treatment focused on Cognition   Skilled Treatment Patient was 70% accurate for generating grammatical and cogent sentences relevant to images displayed by clinician. Patient was 75% accurate for solving logic puzzles that focused on planning, organization, and making inferences with mod cues from clinician that included cues to utilize organizational and planning strategies, attention to task for maintaining place, and cues to remember details of information. Patient was 65% accurate for solving deductive reasoning problems that focused on making inferences related to a short passage with maximum  support. Patient was 80% accurate for answering questions that probed for understanding of content related to a short passage with min support.    Assessment / Recommendations / Plan   Plan Continue with current plan of care   Progression Toward Goals   Progression toward goals Progressing toward goals          SLP Education - 08/06/15 1553    Education provided Yes   Education Details planning and organizational strategies for problem solving   Person(s) Educated Patient   Methods Explanation;Demonstration   Comprehension Verbalized understanding;Returned demonstration            SLP Long Term Goals - 08/06/15 1554    SLP LONG TERM GOAL #1   Title Patient will demonstrate functional cognitive-communication skills for independent completion of personal responsibilities.   Status Partially Met   SLP LONG TERM GOAL #2   Title Patient will complete complex executive function skills tasks with 80% accuracy.   Status Partially Met   SLP LONG TERM GOAL #3   Title Patient will complete complex attention tasks with 80% accuracy.   Status Achieved   SLP LONG TERM GOAL #4   Title Patient will complete memory strategy activities with 80% accuracy.   Status Partially Met   SLP LONG TERM GOAL #5   Title Patient will generate grammatical and cogent sentences to complete abstract/complex linguistic tasks with 80% accuracy   Status Partially Met  Plan - 08/06/15 1553    Clinical Impression Statement Patient returns today with reports that she is making progress with goals relevant to independent completion of personal responsibilities. Patient identified taking responsibility for management of her medications as an important step toward self-reliance, a long-term goal for her that includes returning to work. Although she still requires assistance from her mother to organize her pill box, she has begun to take her pills on her own. Patient also has a meeting scheduled with her  vocational rehab (VR) counselor for Thursday, April 13. However, she noted that she may have to reschedule due to the fact that she may require more time to obtain key pieces of information including her social security card and information about any outstanding legal concerns. Clinician counseled Patient on how to obtain said information, and the importance of communicating to her VR counselor that she is working hard to obtain the necessary information. Patient noted that she does not receive a great deal of support from her family and must consistently advocate for herself. Patient continues to show improvement on cognitive thinking abilities as evidenced by her understanding and completion of logic puzzles and questions that challenge her to make inferences from information provided.    Speech Therapy Frequency 2x / week   Duration Other (comment)  12 weeks   Treatment/Interventions Compensatory techniques;Cognitive reorganization;Internal/external aids;Functional tasks;SLP instruction and feedback;Compensatory strategies;Patient/family education   Potential to Achieve Goals Good   Potential Considerations Ability to learn/carryover information;Cooperation/participation level;Previous level of function;Severity of impairments;Family/community support   SLP Home Exercise Plan identify barriers for ADLs   Consulted and Agree with Plan of Care Patient      Patient will benefit from skilled therapeutic intervention in order to improve the following deficits and impairments:   Cognitive communication deficit    Problem List There are no active problems to display for this patient.   Wynelle Cleveland 08/06/2015, 3:55 PM  [place] MAIN [facility] SERVICES [address] [place], [place], [address] Phone: [phone]   Fax:  [phone]   Name: Natasha [name] MRN: [id number] Date of Birth: [birth year]
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
## 2015-08-06 NOTE — Patient Instructions (Signed)
OT TREATMENT    Neuro muscular re-education: Pt. worked on improving [facility] skills grasping and manipulating coins and placing them in a resistive counter container after calculating coin amounts from one dollar. Pt. worked on grasping and storing coins before placing them in the counter. Pt. Worked on grasping [place] style discs. Grasping the items, and flipping them in hand.  Therapeutic Exercise: Pt. worked on grip strengthening with gross gripper. In both the right and left hands. Pt. worked on sustaining grip while reaching in all planes of the right and left UE and hands.

## 2015-08-08 ENCOUNTER — Ambulatory Visit: Payer: Commercial Managed Care - HMO | Admitting: Occupational Therapy

## 2015-08-08 ENCOUNTER — Encounter: Payer: Self-pay | Admitting: Physical Therapy

## 2015-08-08 ENCOUNTER — Ambulatory Visit: Payer: Commercial Managed Care - HMO | Admitting: Physical Therapy

## 2015-08-08 ENCOUNTER — Ambulatory Visit: Payer: Commercial Managed Care - HMO | Admitting: Speech Pathology

## 2015-08-08 DIAGNOSIS — M6281 Muscle weakness (generalized): Secondary | ICD-10-CM

## 2015-08-08 DIAGNOSIS — R262 Difficulty in walking, not elsewhere classified: Secondary | ICD-10-CM

## 2015-08-08 DIAGNOSIS — R41841 Cognitive communication deficit: Secondary | ICD-10-CM

## 2015-08-08 DIAGNOSIS — R278 Other lack of coordination: Secondary | ICD-10-CM

## 2015-08-08 DIAGNOSIS — R279 Unspecified lack of coordination: Secondary | ICD-10-CM

## 2015-08-08 DIAGNOSIS — R2681 Unsteadiness on feet: Secondary | ICD-10-CM

## 2015-08-08 NOTE — Patient Instructions (Addendum)
Provided and reviewed HEP for corner balance SLS exercise from www.hep2go.com

## 2015-08-08 NOTE — Therapy (Signed)
[place] [facility] MAIN [facility] SERVICES [address] [place], [place], [address] Phone: [phone]   Fax:  [phone]  Physical Therapy Treatment  Patient Details  Name: Natasha ShadeWhitney B [name] MRN: [id number] Date of Birth: 11/05/1985 Referring Provider: Doristine MangoWhite elizabeth  Encounter Date: 08/08/2015      PT End of Session - 08/08/15 1644    Visit Number 29   Number of Visits 49   Date for PT Re-Evaluation 10/31/15   PT Start Time 1600   PT Stop Time 1645   PT Time Calculation (min) 45 min   Equipment Utilized During Treatment Gait belt   Activity Tolerance Patient tolerated treatment well   Behavior During Therapy [facility] for tasks assessed/performed      Past Medical History  Diagnosis Date  . Restless leg syndrome unk  . Polysubstance abuse     Past Surgical History  Procedure Laterality Date  . Cholecystectomy    . Tubal ligation      There were no vitals filed for this visit.      Subjective Assessment - 08/08/15 1607    Subjective Pt reports she is feeling good. She has been practicing heel to toe gait.   Currently in Pain? No/denies      Warm up: Nustep L4 x 5 min (unbilled)  Neuro re-education: Forward walking in parallel bars without UE support x 6 Side stepping over foam in parallel bars with out UE support x 20 Step over and back with half foam roll (pre gait) without UE support x 20 each side Step up/back onto 6 inch step without UE support x10 each Cues for increased step height to improve foot clearance and safety, postural correction. Matrix resisted walking with 7.5# F/B with min A; cues for weight shifting, heel to toe and increase step length and BOS for increased stability.  SLS in corner x5 each side up to 3 seconds  Cues for weight shifting and slow controlled movements.                           PT Education - 08/08/15 1637    Education provided Yes   Education Details HEP corner balance  exercise   Person(s) Educated Patient   Methods Explanation;Demonstration   Comprehension Verbalized understanding;Returned demonstration             PT Long Term Goals - 08/08/15 1647    PT LONG TERM GOAL #1   Title Patient will reduce timed up and go to <11 seconds to reduce fall risk and demonstrate improved transfer/gait ability.   Time 12   Period Weeks   Status On-going   PT LONG TERM GOAL #2   Title Patient will increase six minute walk test distance to >1000 for progression to community ambulator and improve gait ability   Time 12   Period Weeks   Status On-going   PT LONG TERM GOAL #3   Title Patient will increase 10 meter walk test to >1.3836m/s as to improve gait speed for better community ambulation and to reduce fall    Time 12   Period Weeks   Status On-going   PT LONG TERM GOAL #4   Title Patient will tolerate 5 seconds of single leg stance without loss of balance to improve ability to get in and out of shower safely   Time 12   Period Weeks   Status On-going   PT LONG TERM GOAL #5  Title Patient (< 11 years old) will complete five times sit to stand test in < 10 seconds indicating an increased LE strength and improved balance   Time 12   Period Weeks   Status On-going               Plan - 08/08/15 1645    Clinical Impression Statement Pt's gait pattern, balance and strength gradually improving. She demonstrates improved heel to toe with less cues. Coordination remains limited. She will benefit from continued skilled PT to address deficits of strength, balance, gait and coordination to increase functional independence.   Rehab Potential Good   PT Frequency 2x / week   PT Duration 12 weeks   PT Treatment/Interventions Therapeutic activities;Therapeutic exercise;Balance training;Neuromuscular re-education;Stair training;Gait training;Patient/family education   PT Next Visit Plan strengthening and balance training; gait training   PT Home Exercise Plan  balance in corner   Consulted and Agree with Plan of Care Patient      Patient will benefit from skilled therapeutic intervention in order to improve the following deficits and impairments:  Abnormal gait, Decreased balance, Decreased endurance, Decreased mobility, Difficulty walking, Decreased cognition, Decreased safety awareness, Decreased strength, Impaired UE functional use, Obesity  Visit Diagnosis: Muscle weakness (generalized) - Plan: PT plan of care cert/re-cert  Unsteadiness on feet - Plan: PT plan of care cert/re-cert  Difficulty in walking, not elsewhere classified - Plan: PT plan of care cert/re-cert  Unspecified lack of coordination - Plan: PT plan of care cert/re-cert     Problem List There are no active problems to display for this patient.   Natasha [name], PT, DPT  08/08/2015, 4:51 PM [phone]  [facility] MAIN [facility] SERVICES [address] [place], [place], [address] Phone: [phone]   Fax:  [phone]  Name: Natasha [name] MRN: [id number] Date of Birth: 09-04-1985

## 2015-08-09 ENCOUNTER — Encounter: Payer: Self-pay | Admitting: Speech Pathology

## 2015-08-09 NOTE — Therapy (Signed)
[place] MAIN [facility] SERVICES [address] [place], [place], [address] Phone: [phone]   Fax:  [phone]  Speech Language Pathology Treatment  Patient Details  Name: Natasha [name] MRN: [id number] Date of Birth: 01-Oct-1985 Referring Provider: Dr. Janene Harvey  Encounter Date: 08/08/2015      End of Session - 08/09/15 1620    Visit Number 37   Number of Visits 49   Date for SLP Re-Evaluation 09/21/15   SLP Start Time 12   SLP Stop Time  1500   SLP Time Calculation (min) 60 min   Activity Tolerance Patient tolerated treatment well      Past Medical History  Diagnosis Date  . Restless leg syndrome unk  . Polysubstance abuse     Past Surgical History  Procedure Laterality Date  . Cholecystectomy    . Tubal ligation      There were no vitals filed for this visit.      Subjective Assessment - 08/09/15 1619    Subjective Patient returns with reports that she continues to improve with her thinking.               ADULT SLP TREATMENT - 08/09/15 0001    General Information   Behavior/Cognition Alert;Cooperative;Pleasant mood   HPI TBI   Treatment Provided   Treatment provided Cognitive-Linquistic   Pain Assessment   Pain Assessment No/denies pain   Cognitive-Linquistic Treatment   Treatment focused on Cognition   Skilled Treatment Patient was 75% accurate for identifying the outlier in a group of five semantically related, abstract concepts with moderate support from the clinician. Patient was 75% accurate for generating grammatical and cogent sentences relevant to images displayed by clinician. Patient was 65% accurate for solving deductive reasoning problems that focused on making inferences related to a short passage with maximum support. Patient was 80% accurate for answering questions that probed for understanding of content related to a short passage with min support.    Assessment / Recommendations / Plan   Plan  Continue with current plan of care   Progression Toward Goals   Progression toward goals Progressing toward goals          SLP Education - 08/09/15 1619    Education provided Yes   Education Details organizational and planning strategies for problem solving   Person(s) Educated Patient   Methods Explanation;Demonstration   Comprehension Verbalized understanding;Returned demonstration            SLP Long Term Goals - 08/06/15 1554    SLP LONG TERM GOAL #1   Title Patient will demonstrate functional cognitive-communication skills for independent completion of personal responsibilities.   Status Partially Met   SLP LONG TERM GOAL #2   Title Patient will complete complex executive function skills tasks with 80% accuracy.   Status Partially Met   SLP LONG TERM GOAL #3   Title Patient will complete complex attention tasks with 80% accuracy.   Status Achieved   SLP LONG TERM GOAL #4   Title Patient will complete memory strategy activities with 80% accuracy.   Status Partially Met   SLP LONG TERM GOAL #5   Title Patient will generate grammatical and cogent sentences to complete abstract/complex linguistic tasks with 80% accuracy   Status Partially Met          Plan - 08/09/15 1621    Clinical Impression Statement Patient returns today with reports that she continues to make progress with goals relevant to independent completion  of personal responsibilities including cooking some of her own meals at home. Patient has also started to take her pills on her own, but continues to require assistance from her mother to organize her pill box. Patient noted that had to reschedule her meeting with vocational rehab due to the fact that she will require more time to obtain key pieces of information including her social security card and information about any outstanding legal concerns. Clinician counseled Patient on how to obtain said information, and the importance of communicating to her VR  counselor that she is working hard to obtain the necessary information.    Speech Therapy Frequency 2x / week   Duration Other (comment)  12 weeks   Treatment/Interventions Compensatory techniques;Cognitive reorganization;Internal/external aids;Functional tasks;SLP instruction and feedback;Compensatory strategies;Patient/family education   Potential to Achieve Goals Good   Potential Considerations Ability to learn/carryover information;Cooperation/participation level;Previous level of function;Severity of impairments;Family/community support   SLP Home Exercise Plan identify barriers for ADLs   Consulted and Agree with Plan of Care Patient      Patient will benefit from skilled therapeutic intervention in order to improve the following deficits and impairments:   Cognitive communication deficit    Problem List There are no active problems to display for this patient.   Wynelle Cleveland 08/09/2015, 4:21 PM  [place] MAIN [facility] SERVICES [address] [place], [place], [address] Phone: [phone]   Fax:  [phone]   Name: Natasha [name] MRN: [id number] Date of Birth: [birth year]

## 2015-08-10 ENCOUNTER — Encounter: Payer: Self-pay | Admitting: Occupational Therapy

## 2015-08-10 NOTE — Therapy (Signed)
[place] MAIN [facility] SERVICES [place], [place], [address] Phone: [phone]   Fax:  [phone]  Occupational Therapy Treatment  Patient Details  Name: Natasha [name] MRN: [id number] Date of Birth: [birth year] No Data Recorded  Encounter Date: 08/08/2015      OT End of Session - 08/10/15 1126    Visit Number 30   Number of Visits 50   Date for OT Re-Evaluation 09/11/15   Authorization Type next 30 day progress note: 08/19/2015   OT Start Time 1515   OT Stop Time 1601   OT Time Calculation (min) 46 min   Activity Tolerance Patient tolerated treatment well   Behavior During Therapy [facility] for tasks assessed/performed      Past Medical History  Diagnosis Date  . Restless leg syndrome unk  . Polysubstance abuse     Past Surgical History  Procedure Laterality Date  . Cholecystectomy    . Tubal ligation      There were no vitals filed for this visit.      Subjective Assessment - 08/10/15 1125    Subjective  Patient reports she would like to be able to cook more at home and wants to focus on meal planning.     Patient Stated Goals Patient reports she would like to be able to play with her kids, walk, talk, cook and be able to live independently.   Currently in Pain? No/denies   Pain Score 0-No pain   Multiple Pain Sites No                      OT Treatments/Exercises (OP) - 08/09/15 1129    ADLs   ADL Comments Patient seen this date for process of meal planning for 7 days, supper meal only for her family.  She was able to organize a 7 day list with a main course (meat) and 2 vegetables for each meal and any associated items such as rolls/bread.  She required occasional cues for generating ideas for meals and to alter choices throughout the week.  Discussed the next step of formulation of grocery list to be able to have ingredients to prepare the current list.  Patient also seen for handwriting of items  for list with cues for legibility and form of letters.    Fine Motor Coordination   Other Fine Motor Exercises Patient seen for fine motor coordination tasks with right hand with manipulation of 1/2 inch sized objects, prehension patterns, translatory movements of the hand and using the hand for storage with cues.                  OT Education - 08/10/15 1126    Education provided Yes   Education Details meal planning, HEP   Person(s) Educated Patient   Methods Explanation;Demonstration;Verbal cues   Comprehension Verbal cues required;Returned demonstration;Verbalized understanding             OT Long Term Goals - 08/09/15 1127    OT LONG TERM GOAL #1   Title Patient will improve bilateral UE strength by 1 mm grade to complete IADL tasks with modified independence.     Baseline 4/5 bilateral UE at eval   Time 12   Period Weeks   Status Partially Met   OT LONG TERM GOAL #2   Title Patient will complete laundry tasks with supervision.   Time 12   Period Weeks   Status Achieved   OT LONG  TERM GOAL #3   Title Patient will pour tea from a pitcher without spillage   Time 12   Period Weeks   Status Achieved   OT LONG TERM GOAL #4   Title Patient will demonstrate the ability to transport snack items from the kitchen to the den without spilling items.    Time 12   Period Weeks   Status Achieved   OT LONG TERM GOAL #5   Title Patient will demonstrate the ability to choose her own clothing each day, matching 100% of the time.   Baseline mom has to perform.   Time 12   Period Weeks   Status Achieved   OT LONG TERM GOAL #6   Title Pt. will improve right [place] by 5 sec. to be able to independently open packages and container lids.   Baseline Pt. has difficulty opening snack packets and container lids.   Time 12   Period Weeks   Status On-going   OT LONG TERM GOAL #7   Title Pt. will independently plan meals and menus for one week in preparation for creating a weekly  shopping list.   Baseline Pt. currently has difficulty with meal planning, and creating shopping lists.   Time 12   Period Weeks   Status On-going   OT LONG TERM GOAL #8   Title Pt. will require supervision vacuuming while using compensatory strategies.   Baseline Pt. is unable to vacuuming   Time 12   Period Weeks   Status On-going   OT LONG TERM GOAL  #9   Baseline Pt. will independently demonstrate organization and placement of items in the refrigerator for easier access.   Time 12   Period Weeks   Status Achieved   OT LONG TERM GOAL  #10   TITLE Pt. will independently calculate, manage, and simulate writing checks for one month of bill management.      Baseline unable    Time 12   Period Weeks   Status Partially Met   OT LONG TERM GOAL  #11   TITLE Pt. will independently be able to put jewelry including fastening clasps.   Baseline difficulty   Time 12   Period Weeks   Status Partially Met   OT LONG TERM GOAL  #12   TITLE Pt. will prepare and cook a complex, multiple step meal with supervision.    Baseline Pt. is able to cook a simple one step meal with S-CGA.   Time 12   Period Weeks   Status On-going               Plan - 08/10/15 1127    Clinical Impression Statement Patient has continued to make good progess towards goals with focus on strength, coordination, self care tasks, IADL tasks and cognition as it relates to these areas.  She would like to eventually be able to live alone and wants to improve her independence with all tasks.  She would continue to benefit from skilled OT intervention to work on these areas towards her goals of independent living.    Rehab Potential Good   OT Frequency 2x / week   OT Duration 12 weeks   OT Treatment/Interventions Self-care/ADL training;Therapeutic exercise;Moist Heat;Neuromuscular education;DME and/or AE instruction;Therapeutic activities;Patient/family education;Cognitive remediation/compensation   Consulted and Agree  with Plan of Care Patient      Patient will benefit from skilled therapeutic intervention in order to improve the following deficits and impairments:  Decreased cognition, Decreased knowledge of use  of DME, Decreased coordination, Decreased mobility, Decreased endurance, Decreased strength, Decreased balance, Decreased safety awareness, Decreased knowledge of precautions, Impaired UE functional use  Visit Diagnosis: Muscle weakness (generalized)  Other lack of coordination    Problem List There are no active problems to display for this patient.  Achilles Dunk, OTR/L, CLT  Narcisa Ganesh 08/10/2015, 11:41 AM  [place] MAIN [facility] SERVICES [address] [place], [place], [address] Phone: [phone]   Fax:  [phone]  Name: Natasha [name] MRN: [id number] Date of Birth: 1986/01/16

## 2015-08-13 ENCOUNTER — Ambulatory Visit: Payer: Commercial Managed Care - HMO | Admitting: Physical Therapy

## 2015-08-13 ENCOUNTER — Encounter: Payer: Self-pay | Admitting: Physical Therapy

## 2015-08-13 ENCOUNTER — Ambulatory Visit: Payer: Commercial Managed Care - HMO | Admitting: Occupational Therapy

## 2015-08-13 ENCOUNTER — Ambulatory Visit: Payer: Commercial Managed Care - HMO | Admitting: Speech Pathology

## 2015-08-13 DIAGNOSIS — R41841 Cognitive communication deficit: Secondary | ICD-10-CM | POA: Diagnosis not present

## 2015-08-13 DIAGNOSIS — R2681 Unsteadiness on feet: Secondary | ICD-10-CM

## 2015-08-13 DIAGNOSIS — R278 Other lack of coordination: Secondary | ICD-10-CM

## 2015-08-13 DIAGNOSIS — M6281 Muscle weakness (generalized): Secondary | ICD-10-CM

## 2015-08-13 NOTE — Therapy (Addendum)
[place] [facility] MAIN [facility] SERVICES [address] [place], [place], [address] Phone: [phone]   Fax:  [phone]  Physical Therapy Treatment  Patient Details  Name: Natasha [name] MRN: [id number] Date of Birth: 1986/04/04 Referring Provider: Doristine Mango  Encounter Date: 08/13/2015      PT End of Session - 08/13/15 1428    Visit Number 30   PT Start Time 0300   PT Stop Time 0345   PT Time Calculation (min) 45 min   Equipment Utilized During Treatment Gait belt   Behavior During Therapy [facility] for tasks assessed/performed      Past Medical History  Diagnosis Date  . Restless leg syndrome unk  . Polysubstance abuse     Past Surgical History  Procedure Laterality Date  . Cholecystectomy    . Tubal ligation      There were no vitals filed for this visit.      Subjective Assessment - 08/13/15 1427    Subjective Pt reports she is feeling good. She has been practicing heel to toe gait.   Currently in Pain? No/denies   Pain Score 0-No pain        Therex: Seated LAQ: RLE 2.5lbs 2x10 0lbs LAQ 2x10 Seated hamstring curl yellow band 2x10 BLE Supine march 2x10 with cues for increased hip flexion 2x10 Supine bridge with cues to stablilize pelvis 2x10 Supine SLR 4x5 BLE (assist for LLE) sidelying clamshell 3x5 BLE sidelying hip abduction with assist 3x5 BLE Sit to stand from elevated mat table 3x5 with cues for hip abduction Therex: Nustep L 3 x 4 min no charge In // bars: Standing march 2x10 Fwd/retro walking with 1UE x 3 laps Side steps x 3 laps   Mini squat 2x10 Standing hip abd 2x10 Standing ankle PF/ DF 2x10 LAQ 2x10 Pt requires mod verbal and tactile cues for proper exercise performance                               PT Long Term Goals - 08/08/15 1647    PT LONG TERM GOAL #1   Title Patient will reduce timed up and go to <11 seconds to reduce fall risk and demonstrate improved  transfer/gait ability.   Time 12   Period Weeks   Status On-going   PT LONG TERM GOAL #2   Title Patient will increase six minute walk test distance to >1000 for progression to community ambulator and improve gait ability   Time 12   Period Weeks   Status On-going   PT LONG TERM GOAL #3   Title Patient will increase 10 meter walk test to >1.39m/[name] as to improve gait speed for better community ambulation and to reduce fall    Time 12   Period Weeks   Status On-going   PT LONG TERM GOAL #4   Title Patient will tolerate 5 seconds of single leg stance without loss of balance to improve ability to get in and out of shower safely   Time 12   Period Weeks   Status On-going   PT LONG TERM GOAL #5   Title Patient (< 32 years old) will complete five times sit to stand test in < 10 seconds indicating an increased LE strength and improved balance   Time 12   Period Weeks   Status On-going               Plan - 08/13/15  1428    Clinical Impression Statement Patient has wide base of support with walking and balance activities in the parallel bars.   Rehab Potential Good   PT Frequency 2x / week   PT Duration 12 weeks   PT Treatment/Interventions Therapeutic activities;Therapeutic exercise;Balance training;Neuromuscular re-education;Stair training;Gait training;Patient/family education   PT Next Visit Plan strengthening and balance training; gait training   PT Home Exercise Plan balance in corner   Consulted and Agree with Plan of Care Patient      Patient will benefit from skilled therapeutic intervention in order to improve the following deficits and impairments:  Abnormal gait, Decreased balance, Decreased endurance, Decreased mobility, Difficulty walking, Decreased cognition, Decreased safety awareness, Decreased strength, Impaired UE functional use, Obesity  Visit Diagnosis:  Muscle weakness (generalized)  Unsteadiness on feet     Problem List There are no active problems  to display for this patient.  Natasha [name], PT, DPT Natasha [name], [name] 08/13/2015, 2:52 PM  [place] [facility] [address] [place], [place], [address] Phone: [phone]   Fax:  [phone]  Name: Natasha [name] MRN: [id number] Date of Birth: 05/31/1985

## 2015-08-13 NOTE — Patient Instructions (Signed)
OT TREATMENT    Selfcare: Pt. worked on meal planning, and meal preparation. Pt. worked on creating a Adult nurseshopping/grocery list for 7 dinners. Verbal cues were required for creating the list, organizing the items on the list, and for not duplicating items on the list.  Increased time was required for listing all the items from the meal plan.
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
## 2015-08-14 ENCOUNTER — Encounter: Payer: Self-pay | Admitting: Speech Pathology

## 2015-08-14 NOTE — Therapy (Signed)
[place] MAIN [facility] SERVICES [address] [place], [place], [address] Phone: [phone]   Fax:  [phone]  Speech Language Pathology Treatment  Patient Details  Name: Natasha [name] MRN: [id number] Date of Birth: 26-Dec-1985 Referring Provider: Dr. Janene Harvey  Encounter Date: 08/13/2015      End of Session - 08/14/15 1314    Visit Number 38   Number of Visits 49   Date for SLP Re-Evaluation 09/21/15   SLP Start Time 1500   SLP Stop Time  1600   SLP Time Calculation (min) 60 min   Activity Tolerance Patient tolerated treatment well      Past Medical History  Diagnosis Date  . Restless leg syndrome unk  . Polysubstance abuse     Past Surgical History  Procedure Laterality Date  . Cholecystectomy    . Tubal ligation      There were no vitals filed for this visit.      Subjective Assessment - 08/14/15 1314    Subjective Patient returns with reports that she continues to improve with her thinking.   Currently in Pain? No/denies               ADULT SLP TREATMENT - 08/14/15 0001    General Information   Behavior/Cognition Alert;Cooperative;Pleasant mood   HPI TBI   Treatment Provided   Treatment provided Cognitive-Linquistic   Pain Assessment   Pain Assessment No/denies pain   Cognitive-Linquistic Treatment   Treatment focused on Cognition   Skilled Treatment Patient was 80% accurate for solving verbal analogy problems with min cues; 70% accurate for identifying the bridge type of verbal analogies. Patient was 80% accurate for generating grammatical and cogent sentences relevant to images displayed by clinician. Patient was 75% accurate for solving deductive reasoning problems that focused on making inferences related to a short passage with maximum support. Patient was 80% accurate for answering questions that probed for understanding of content related to a short passage with min support.    Assessment /  Recommendations / Plan   Plan Continue with current plan of care   Progression Toward Goals   Progression toward goals Progressing toward goals          SLP Education - 08/14/15 1314    Education provided Yes   Education Details planning and organizational strategies for problem solving   Person(s) Educated Patient   Methods Explanation;Demonstration   Comprehension Verbalized understanding;Returned demonstration            SLP Long Term Goals - 08/06/15 1554    SLP LONG TERM GOAL #1   Title Patient will demonstrate functional cognitive-communication skills for independent completion of personal responsibilities.   Status Partially Met   SLP LONG TERM GOAL #2   Title Patient will complete complex executive function skills tasks with 80% accuracy.   Status Partially Met   SLP LONG TERM GOAL #3   Title Patient will complete complex attention tasks with 80% accuracy.   Status Achieved   SLP LONG TERM GOAL #4   Title Patient will complete memory strategy activities with 80% accuracy.   Status Partially Met   SLP LONG TERM GOAL #5   Title Patient will generate grammatical and cogent sentences to complete abstract/complex linguistic tasks with 80% accuracy   Status Partially Met          Plan - 08/14/15 1315    Clinical Impression Statement Patient returns today with reports that she continues to make progress with  goals relevant to independent completion of personal responsibilities including cooking some of her own meals at home. However, Patient noted that she will likely have to reschedule her meeting with vocational rehab due to the fact that her mother has medical concerns of her own that will prevent her from being able to drive Patient to the meeting. Clinician counseled Patient on the importance of communicating to her VR counselor that she is committed to returning to work, but will have to reschedule due to family medical concerns.    Speech Therapy Frequency 2x /  week   Duration Other (comment)  12 weeks   Treatment/Interventions Compensatory techniques;Cognitive reorganization;Internal/external aids;Functional tasks;SLP instruction and feedback;Compensatory strategies;Patient/family education   Potential to Achieve Goals Good   Potential Considerations Ability to learn/carryover information;Cooperation/participation level;Previous level of function;Severity of impairments;Family/community support   SLP Home Exercise Plan identify barriers for ADLs   Consulted and Agree with Plan of Care Patient      Patient will benefit from skilled therapeutic intervention in order to improve the following deficits and impairments:   Cognitive communication deficit    Problem List There are no active problems to display for this patient.   Wynelle Cleveland 08/14/2015, 1:16 PM  [place] MAIN [facility] SERVICES [address] [place], [place], [address] Phone: [phone]   Fax:  [phone]   Name: Natasha [name] MRN: [id number] Date of Birth: [birth year]

## 2015-08-15 ENCOUNTER — Encounter: Payer: Self-pay | Admitting: Speech Pathology

## 2015-08-15 ENCOUNTER — Ambulatory Visit: Payer: Commercial Managed Care - HMO | Admitting: Physical Therapy

## 2015-08-15 ENCOUNTER — Ambulatory Visit: Payer: Commercial Managed Care - HMO | Admitting: Occupational Therapy

## 2015-08-15 ENCOUNTER — Ambulatory Visit: Payer: Commercial Managed Care - HMO | Admitting: Speech Pathology

## 2015-08-15 ENCOUNTER — Encounter: Payer: Self-pay | Admitting: Physical Therapy

## 2015-08-15 DIAGNOSIS — R41841 Cognitive communication deficit: Secondary | ICD-10-CM | POA: Diagnosis not present

## 2015-08-15 DIAGNOSIS — M6281 Muscle weakness (generalized): Secondary | ICD-10-CM

## 2015-08-15 DIAGNOSIS — R278 Other lack of coordination: Secondary | ICD-10-CM

## 2015-08-15 DIAGNOSIS — R262 Difficulty in walking, not elsewhere classified: Secondary | ICD-10-CM

## 2015-08-15 DIAGNOSIS — R2681 Unsteadiness on feet: Secondary | ICD-10-CM

## 2015-08-15 NOTE — Patient Instructions (Signed)
OT TREATMENT     Neuro muscular re-education: Pt. performed FMC tasks using the grooved pegboard. Pt. worked on grasping the grooved pegs from a horizontal position, and moving the pegs to a vertical position in the hand to prepare for placing them in the grooved slot. Pt. Worked on grasping and storing multiple pegs at once. Pt. Worked on alternating thumb opposition to the 2nd through 5th digits.  Selfcare: Pt. worked on Producer, television/film/videocreating and organizing a weekly shopping list for lunches for one week, creating a grocery list, and identifying the area of the store where the items are located. Pt. Requires cues and increased time to complete.

## 2015-08-15 NOTE — Therapy (Signed)
[place] [facility] MAIN [facility] SERVICES [address] [place], [place], [address] Phone: [phone]   Fax:  [phone]  Physical Therapy Treatment  Patient Details  Name: Natasha [name] MRN: [id number] Date of Birth: 1985/11/24 Referring Provider: Doristine Mango  Encounter Date: 08/15/2015      PT End of Session - 08/15/15 1445    Visit Number 31   Number of Visits 49   PT Start Time 0215   PT Stop Time 0300   PT Time Calculation (min) 45 min   Equipment Utilized During Treatment Gait belt   Activity Tolerance Patient tolerated treatment well      Past Medical History  Diagnosis Date  . Restless leg syndrome unk  . Polysubstance abuse     Past Surgical History  Procedure Laterality Date  . Cholecystectomy    . Tubal ligation      There were no vitals filed for this visit.      Subjective Assessment - 08/15/15 1443    Subjective Pt reports she is feeling good. She has been practicing heel to toe gait.   Currently in Pain? No/denies      standing hip abd with YTB x 20  side stepping left and right in parallel bars 10 feet x 3 standing on blue foam with cone reaching x 20 across midline step ups from floor to 6 inch stool x 20 bilateral sit to stand x 10 marching in parallel bars x 20 Gait training with 1 loftstrand crutch and 1000 feet x 3 Patient continues to demonstrates less incoordination of movement with select exercises. Patient responds well to verbal and tactile cues to correct form and technique. .  Muscle fatigue but no major pain complaints.                            PT Education - 08/15/15 1444    Education Details practicing ambulation wth one loftstrand crutch in her home.   Methods Explanation   Comprehension Verbalized understanding             PT Long Term Goals - 08/08/15 1647    PT LONG TERM GOAL #1   Title Patient will reduce timed up and go to <11 seconds to reduce fall  risk and demonstrate improved transfer/gait ability.   Time 12   Period Weeks   Status On-going   PT LONG TERM GOAL #2   Title Patient will increase six minute walk test distance to >1000 for progression to community ambulator and improve gait ability   Time 12   Period Weeks   Status On-going   PT LONG TERM GOAL #3   Title Patient will increase 10 meter walk test to >1.16m/s as to improve gait speed for better community ambulation and to reduce fall    Time 12   Period Weeks   Status On-going   PT LONG TERM GOAL #4   Title Patient will tolerate 5 seconds of single leg stance without loss of balance to improve ability to get in and out of shower safely   Time 12   Period Weeks   Status On-going   PT LONG TERM GOAL #5   Title Patient (< 28 years old) will complete five times sit to stand test in < 10 seconds indicating an increased LE strength and improved balance   Time 12   Period Weeks   Status On-going  Plan - 08/15/15 1446    Clinical Impression Statement Patient is able to perform balance and gait activities without pain behaviors.   Rehab Potential Good   PT Frequency 2x / week   PT Duration 12 weeks   PT Treatment/Interventions Therapeutic activities;Therapeutic exercise;Balance training;Neuromuscular re-education;Stair training;Gait training;Patient/family education   PT Next Visit Plan strengthening and balance training; gait training   PT Home Exercise Plan balance in corner   Consulted and Agree with Plan of Care Patient      Patient will benefit from skilled therapeutic intervention in order to improve the following deficits and impairments:  Abnormal gait, Decreased balance, Decreased endurance, Decreased mobility, Difficulty walking, Decreased cognition, Decreased safety awareness, Decreased strength, Impaired UE functional use, Obesity  Visit Diagnosis: Unsteadiness on feet  Muscle weakness (generalized)  Difficulty in walking, not  elsewhere classified     Problem List There are no active problems to display for this patient. Ezekiel InaKristine S [place], PT, DPT  [place], [place] 08/15/2015, 2:48 PM  [place] [facility] MAIN [facility] [address] [place], [place], [address] Phone: [phone]   Fax:  [phone]  Name: Natasha [name] MRN: [id number] Date of Birth: 08/11/1985

## 2015-08-15 NOTE — Therapy (Signed)
[place] MAIN [facility] SERVICES [address] [place], [place], [address] Phone: [phone]   Fax:  [phone]  Speech Language Pathology Treatment  Patient Details  Name: QUETZAL MEANY MRN: [id number] Date of Birth: 1985-09-10 Referring Provider: Dr. Janene Harvey  Encounter Date: 08/15/2015      End of Session - 08/15/15 1732    Visit Number 39   Number of Visits 49   Date for SLP Re-Evaluation 09/21/15   SLP Start Time 1500   SLP Stop Time  1600   SLP Time Calculation (min) 60 min   Activity Tolerance Patient tolerated treatment well      Past Medical History  Diagnosis Date  . Restless leg syndrome unk  . Polysubstance abuse     Past Surgical History  Procedure Laterality Date  . Cholecystectomy    . Tubal ligation      There were no vitals filed for this visit.      Subjective Assessment - 08/15/15 1731    Subjective Patient returns with reports that she continues to improve with her thinking.   Currently in Pain? No/denies               ADULT SLP TREATMENT - 08/15/15 0001    General Information   Behavior/Cognition Alert;Cooperative;Pleasant mood   HPI TBI   Treatment Provided   Treatment provided Cognitive-Linquistic   Pain Assessment   Pain Assessment No/denies pain   Cognitive-Linquistic Treatment   Treatment focused on Cognition   Skilled Treatment Patient was 80% accurate for answering questions that probed for understanding of content related to a short passage with min support. Patient was 80% accurate for answering questions that challenged her to make inferences from information provided with max support. Patient was 70% accurate for solving complex logic problems that targeted planning and organization with max support from clinician.   Assessment / Recommendations / Plan   Plan Continue with current plan of care   Progression Toward Goals   Progression toward goals Progressing toward goals           SLP Education - 08/15/15 1732    Education provided Yes   Education Details planning and organizational strategies for problem solving   Person(s) Educated Patient   Methods Explanation;Demonstration   Comprehension Verbalized understanding;Returned demonstration            SLP Long Term Goals - 08/06/15 1554    SLP LONG TERM GOAL #1   Title Patient will demonstrate functional cognitive-communication skills for independent completion of personal responsibilities.   Status Partially Met   SLP LONG TERM GOAL #2   Title Patient will complete complex executive function skills tasks with 80% accuracy.   Status Partially Met   SLP LONG TERM GOAL #3   Title Patient will complete complex attention tasks with 80% accuracy.   Status Achieved   SLP LONG TERM GOAL #4   Title Patient will complete memory strategy activities with 80% accuracy.   Status Partially Met   SLP LONG TERM GOAL #5   Title Patient will generate grammatical and cogent sentences to complete abstract/complex linguistic tasks with 80% accuracy   Status Partially Met          Plan - 08/15/15 1732    Clinical Impression Statement Patient returns today with reports that she continues to make progress with goals relevant to independent completion of personal responsibilities including doing her own laundry and cooking some of her own meals at home. Patient  noted that she will have to reschedule her meeting with vocational rehab due to the fact that her mother has medical concerns of her own that will prevent her from being able to drive Patient to the meeting. Clinician counseled Patient on the importance of communicating to her VR counselor that she is committed to returning to work, but will have to reschedule due to family medical concerns.    Speech Therapy Frequency 2x / week   Duration Other (comment)  12 weeks   Treatment/Interventions Compensatory techniques;Cognitive reorganization;Internal/external  aids;Functional tasks;SLP instruction and feedback;Compensatory strategies;Patient/family education   Potential to Achieve Goals Good   Potential Considerations Ability to learn/carryover information;Cooperation/participation level;Previous level of function;Severity of impairments;Family/community support   SLP Home Exercise Plan identify barriers for ADLs   Consulted and Agree with Plan of Care Patient      Patient will benefit from skilled therapeutic intervention in order to improve the following deficits and impairments:   Cognitive communication deficit    Problem List There are no active problems to display for this patient.   Wynelle Cleveland 08/15/2015, 5:33 PM  [place] MAIN [facility] SERVICES [address] [place], [place], [address] Phone: [phone]   Fax:  [phone]   Name: RUDY DOMEK MRN: [id number] Date of Birth: 10-May-1985
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
## 2015-08-20 ENCOUNTER — Ambulatory Visit: Payer: Commercial Managed Care - HMO | Admitting: Occupational Therapy

## 2015-08-20 ENCOUNTER — Ambulatory Visit: Payer: Commercial Managed Care - HMO | Admitting: Physical Therapy

## 2015-08-20 ENCOUNTER — Ambulatory Visit: Payer: Commercial Managed Care - HMO | Admitting: Speech Pathology

## 2015-08-20 ENCOUNTER — Encounter: Payer: Self-pay | Admitting: Physical Therapy

## 2015-08-20 DIAGNOSIS — R41841 Cognitive communication deficit: Secondary | ICD-10-CM

## 2015-08-20 DIAGNOSIS — R262 Difficulty in walking, not elsewhere classified: Secondary | ICD-10-CM

## 2015-08-20 DIAGNOSIS — M6281 Muscle weakness (generalized): Secondary | ICD-10-CM

## 2015-08-20 DIAGNOSIS — R278 Other lack of coordination: Secondary | ICD-10-CM

## 2015-08-20 NOTE — Patient Instructions (Addendum)
OT TREATMENT    Selfcare: Pt. worked on creating meal plans for one week of meals including breakfast, lunch, and dinner. Pt. formulated a grocery list for one full week of meals, and snacks. Pt. requires cues and increased time for meal planning, and creating a shopping list.  Pt. was able to keep her attention during the full duration of the treatment session.

## 2015-08-20 NOTE — Therapy (Signed)
[place] [facility] [address] [place], [place], [address] Phone: [phone]   Fax:  [phone]  Physical Therapy Treatment  Patient Details  Name: Natasha ShadeWhitney B [name] MRN: [id number] Date of Birth: 08/27/1985 Referring Provider: Doristine MangoWhite elizabeth  Encounter Date: 08/20/2015      PT End of Session - 08/20/15 1622    Visit Number 32   PT Start Time 0400   PT Stop Time 0440   PT Time Calculation (min) 40 min   Activity Tolerance Patient tolerated treatment well      Past Medical History  Diagnosis Date  . Restless leg syndrome unk  . Polysubstance abuse     Past Surgical History  Procedure Laterality Date  . Cholecystectomy    . Tubal ligation      There were no vitals filed for this visit.      Subjective Assessment - 08/20/15 1611    Subjective Patient is feeling more steady on her feet.    Currently in Pain? No/denies      standing hip abd/extension/flex  with YTB x 20  side stepping left and right in parallel bars on blue foam 10 feet x 3 Walking in parallel bars x 5 without UE support step ups from floor to 6 inch stool x 20 bilateral sit to stand x 10 marching in parallel bars x 20 TM walking . 3 miles/ hour without UE, 1.0 m/ hour with UE support Patient needs occasional verbal cueing to improve posture and cueing to correctly perform exercises slowly, holding at end of range to increase motor firing of desired muscle to encourage fatigue.                             PT Education - 08/20/15 1622    Education provided Yes   Education Details safety with 1 loftstrand   Person(s) Educated Patient   Methods Explanation   Comprehension Verbalized understanding             PT Long Term Goals - 08/08/15 1647    PT LONG TERM GOAL #1   Title Patient will reduce timed up and go to <11 seconds to reduce fall risk and demonstrate improved transfer/gait ability.   Time 12   Period  Weeks   Status On-going   PT LONG TERM GOAL #2   Title Patient will increase six minute walk test distance to >1000 for progression to community ambulator and improve gait ability   Time 12   Period Weeks   Status On-going   PT LONG TERM GOAL #3   Title Patient will increase 10 meter walk test to >1.7032m/s as to improve gait speed for better community ambulation and to reduce fall    Time 12   Period Weeks   Status On-going   PT LONG TERM GOAL #4   Title Patient will tolerate 5 seconds of single leg stance without loss of balance to improve ability to get in and out of shower safely   Time 12   Period Weeks   Status On-going   PT LONG TERM GOAL #5   Title Patient (< 30 years old) will complete five times sit to stand test in < 10 seconds indicating an increased LE strength and improved balance   Time 12   Period Weeks   Status On-going               Plan -  08/20/15 1623    Clinical Impression Statement Patient continues to have strength deficits in BLE,and decreased dynamic and static standing balance without pain behaviors.    Rehab Potential Good   PT Frequency 2x / week   PT Duration 12 weeks   PT Treatment/Interventions Therapeutic activities;Therapeutic exercise;Balance training;Neuromuscular re-education;Stair training;Gait training;Patient/family education   PT Next Visit Plan strengthening and balance training; gait training   PT Home Exercise Plan balance in corner   Consulted and Agree with Plan of Care Patient      Patient will benefit from skilled therapeutic intervention in order to improve the following deficits and impairments:  Abnormal gait, Decreased balance, Decreased endurance, Decreased mobility, Difficulty walking, Decreased cognition, Decreased safety awareness, Decreased strength, Impaired UE functional use, Obesity  Visit Diagnosis: Muscle weakness (generalized)  Difficulty in walking, not elsewhere classified     Problem List There are no  active problems to display for this patient. [name], PT, DPT  [place], Bark[place] Bruns S 08/20/2015, 4:46 PM  [place] [facility] MAIN [facility] SERVICES [address] [place], [place], [address] Phone: [phone]   Fax:  [phone]  Name: Natasha [name] MRN: [id number] Date of Birth: Nov 30, 1985
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
## 2015-08-21 ENCOUNTER — Encounter: Payer: Self-pay | Admitting: Speech Pathology

## 2015-08-21 NOTE — Therapy (Signed)
[place] MAIN [facility] SERVICES [address] [place], [place], [address] Phone: [phone]   Fax:  [phone]  Speech Language Pathology Treatment  Patient Details  Name: Natasha [name] MRN: [id number] Date of Birth: [birth year] Referring Provider: Dr. Janene Harvey  Encounter Date: 08/20/2015      End of Session - 08/21/15 1331    Visit Number 40   Number of Visits 49   Date for SLP Re-Evaluation 09/21/15   SLP Start Time 79   SLP Stop Time  1600   SLP Time Calculation (min) 50 min   Activity Tolerance Patient tolerated treatment well      Past Medical History  Diagnosis Date  . Restless leg syndrome unk  . Polysubstance abuse     Past Surgical History  Procedure Laterality Date  . Cholecystectomy    . Tubal ligation      There were no vitals filed for this visit.      Subjective Assessment - 08/21/15 1330    Subjective Patient returns with reports that she continues to improve with her thinking.   Currently in Pain? No/denies               ADULT SLP TREATMENT - 08/21/15 0001    General Information   Behavior/Cognition Alert;Cooperative;Pleasant mood   HPI TBI   Treatment Provided   Treatment provided Cognitive-Linquistic   Pain Assessment   Pain Assessment No/denies pain   Cognitive-Linquistic Treatment   Treatment focused on Cognition   Skilled Treatment Patient was 80% accurate for answering questions that probed for understanding of content related to a short passage with min support. Patient was 80% accurate for answering questions that challenged her to make inferences from information provided with max support. Patient was 70% accurate for solving complex logic problems that targeted planning and organization with max support from clinician.   Assessment / Recommendations / Plan   Plan Continue with current plan of care   Progression Toward Goals   Progression toward goals Progressing toward goals           SLP Education - 08/21/15 1330    Education provided Yes   Education Details planning and organizational strategies for problem solving   Person(s) Educated Patient   Methods Explanation;Demonstration   Comprehension Verbalized understanding;Returned demonstration            SLP Long Term Goals - 08/06/15 1554    SLP LONG TERM GOAL #1   Title Patient will demonstrate functional cognitive-communication skills for independent completion of personal responsibilities.   Status Partially Met   SLP LONG TERM GOAL #2   Title Patient will complete complex executive function skills tasks with 80% accuracy.   Status Partially Met   SLP LONG TERM GOAL #3   Title Patient will complete complex attention tasks with 80% accuracy.   Status Achieved   SLP LONG TERM GOAL #4   Title Patient will complete memory strategy activities with 80% accuracy.   Status Partially Met   SLP LONG TERM GOAL #5   Title Patient will generate grammatical and cogent sentences to complete abstract/complex linguistic tasks with 80% accuracy   Status Partially Met          Plan - 08/21/15 1331    Clinical Impression Statement Patient returns today with reports that she continues to make progress with goals relevant to independent completion of personal responsibilities including doing her own laundry and cooking some of her own meals at home. Today,  Patient identified several areas she would like to take responsibility for including paying her bills and rent, and managing her medications. Currently, Patient's mother and sister are helping her complete these activities.      Speech Therapy Frequency 2x / week   Duration Other (comment)  12 weeks   Treatment/Interventions Compensatory techniques;Cognitive reorganization;Internal/external aids;Functional tasks;SLP instruction and feedback;Compensatory strategies;Patient/family education   Potential to Achieve Goals Good   Potential Considerations Ability to  learn/carryover information;Cooperation/participation level;Previous level of function;Severity of impairments;Family/community support   SLP Home Exercise Plan identify barriers for ADLs   Consulted and Agree with Plan of Care Patient      Patient will benefit from skilled therapeutic intervention in order to improve the following deficits and impairments:   Cognitive communication deficit    Problem List There are no active problems to display for this patient.   Wynelle Cleveland 08/21/2015, 1:32 PM  [place] MAIN [facility] SERVICES [address] [place], [place], [address] Phone: [phone]   Fax:  [phone]   Name: Natasha [name] MRN: [id number] Date of Birth: 1985/12/16

## 2015-08-22 ENCOUNTER — Ambulatory Visit: Payer: Commercial Managed Care - HMO | Admitting: Speech Pathology

## 2015-08-22 ENCOUNTER — Encounter: Payer: Self-pay | Admitting: Occupational Therapy

## 2015-08-22 ENCOUNTER — Encounter: Payer: Self-pay | Admitting: Physical Therapy

## 2015-08-22 ENCOUNTER — Encounter: Payer: Self-pay | Admitting: Speech Pathology

## 2015-08-22 ENCOUNTER — Ambulatory Visit: Payer: Commercial Managed Care - HMO | Admitting: Physical Therapy

## 2015-08-22 ENCOUNTER — Ambulatory Visit: Payer: Commercial Managed Care - HMO | Admitting: Occupational Therapy

## 2015-08-22 DIAGNOSIS — R262 Difficulty in walking, not elsewhere classified: Secondary | ICD-10-CM

## 2015-08-22 DIAGNOSIS — R278 Other lack of coordination: Secondary | ICD-10-CM

## 2015-08-22 DIAGNOSIS — M6281 Muscle weakness (generalized): Secondary | ICD-10-CM

## 2015-08-22 DIAGNOSIS — R41841 Cognitive communication deficit: Secondary | ICD-10-CM

## 2015-08-22 NOTE — Therapy (Signed)
[place] [facility] MAIN [facility] SERVICES [address] [place], [place], [address] Phone: [phone]   Fax:  [phone]  Physical Therapy Treatment  Patient Details  Name: Natasha [name] MRN: [id number] Date of Birth: 05-10-1985 Referring Provider: Doristine Mango  Encounter Date: 08/22/2015      PT End of Session - 08/22/15 1610    Visit Number 33   PT Start Time 0400   PT Stop Time 0445   PT Time Calculation (min) 45 min   Equipment Utilized During Treatment Gait belt   Activity Tolerance Patient tolerated treatment well      Past Medical History  Diagnosis Date  . Restless leg syndrome unk  . Polysubstance abuse     Past Surgical History  Procedure Laterality Date  . Cholecystectomy    . Tubal ligation      There were no vitals filed for this visit.      Subjective Assessment - 08/22/15 1608    Subjective Patient is walking with loft strand crutches outside of the house and using rollaor inside the house.    Currently in Pain? No/denies      standing hip abd with YTB x 20  side stepping left and right in parallel bars 10 feet x 3 standing on blue foam with cone reaching x 20 across midline step ups from floor to 6 inch stool x 20 bilateral sit to stand x 10 marching in parallel bars x 20 Leg press 90 lbs Sorting balls on blue foam  Side stepping on blue balance beam Tandem standing on blue foam  Gait training with 1 loftstrand crutch and SBA 1000 feet on uneven surfaces and 1000 feet x 3.  Patient needs occasional verbal cueing to improve posture and cueing to correctly perform exercises slowly, holding at end of range to increase motor firing of desired muscle to encourage fatigue.                             PT Education - 08/22/15 1609    Education provided Yes   Education Details safety with balance and turning and reaching   Person(s) Educated Patient   Methods Explanation   Comprehension  Verbalized understanding             PT Long Term Goals - 08/08/15 1647    PT LONG TERM GOAL #1   Title Patient will reduce timed up and go to <11 seconds to reduce fall risk and demonstrate improved transfer/gait ability.   Time 12   Period Weeks   Status On-going   PT LONG TERM GOAL #2   Title Patient will increase six minute walk test distance to >1000 for progression to community ambulator and improve gait ability   Time 12   Period Weeks   Status On-going   PT LONG TERM GOAL #3   Title Patient will increase 10 meter walk test to >1.94m/s as to improve gait speed for better community ambulation and to reduce fall    Time 12   Period Weeks   Status On-going   PT LONG TERM GOAL #4   Title Patient will tolerate 5 seconds of single leg stance without loss of balance to improve ability to get in and out of shower safely   Time 12   Period Weeks   Status On-going   PT LONG TERM GOAL #5   Title Patient (< 52 years old) will complete five  times sit to stand test in < 10 seconds indicating an increased LE strength and improved balance   Time 12   Period Weeks   Status On-going               Plan - 08/22/15 1611    Clinical Impression Statement Muscle fatigue but no major pain complaints. Patient advancing to red theraband for exercises listed above.   Rehab Potential Good   PT Frequency 2x / week   PT Duration 12 weeks   PT Treatment/Interventions Therapeutic activities;Therapeutic exercise;Balance training;Neuromuscular re-education;Stair training;Gait training;Patient/family education   PT Next Visit Plan strengthening and balance training; gait training   PT Home Exercise Plan balance in corner   Consulted and Agree with Plan of Care Patient      Patient will benefit from skilled therapeutic intervention in order to improve the following deficits and impairments:  Abnormal gait, Decreased balance, Decreased endurance, Decreased mobility, Difficulty walking,  Decreased cognition, Decreased safety awareness, Decreased strength, Impaired UE functional use, Obesity  Visit Diagnosis: Muscle weakness (generalized)  Difficulty in walking, not elsewhere classified     Problem List There are no active problems to display for this patient. Ezekiel InaKristine S Yoselin Amerman, PT, DPT  [place], [place] BrunsKristine S 08/22/2015, 4:38 PM  [place] [facility] MAIN [facility] [address], [place], [id number] Phone: [phone]   Fax:  [phone]  Name: Natasha [name] MRN: [id number] Date of Birth: 02/16/1986

## 2015-08-22 NOTE — Therapy (Signed)
[place] MAIN [facility] SERVICES [address] [place], [place], [address] Phone: [phone]   Fax:  [phone]  Speech Language Pathology Treatment  Patient Details  Name: Natasha [name] MRN: [id number] Date of Birth: 05-12-1985 Referring Provider: Dr. Janene Harvey  Encounter Date: 08/22/2015      End of Session - 08/22/15 1620    Visit Number 41   Number of Visits 34   Date for SLP Re-Evaluation 09/21/15   SLP Start Time 1500   SLP Stop Time  1600   SLP Time Calculation (min) 60 min   Activity Tolerance Patient tolerated treatment well      Past Medical History  Diagnosis Date  . Restless leg syndrome unk  . Polysubstance abuse     Past Surgical History  Procedure Laterality Date  . Cholecystectomy    . Tubal ligation      There were no vitals filed for this visit.      Subjective Assessment - 08/22/15 1619    Subjective Patient returns with reports that she continues to improve with her thinking.   Currently in Pain? No/denies               ADULT SLP TREATMENT - 08/22/15 0001    General Information   Behavior/Cognition Alert;Cooperative;Pleasant mood   HPI TBI   Treatment Provided   Treatment provided Cognitive-Linquistic   Pain Assessment   Pain Assessment No/denies pain   Cognitive-Linquistic Treatment   Treatment focused on Cognition   Skilled Treatment Patient was 90% accurate for solving verbal math problems that targeted working memory with min cues. Patient was 80% accurate for answering questions that probed for understanding of content related to a short passage with min support. Patient was 70% accurate for answering questions that challenged her to make inferences from information provided with max support. Patient was 70% accurate for solving complex logic problems that targeted planning and organization with max support from clinician.   Assessment / Recommendations / Plan   Plan Continue with  current plan of care   Progression Toward Goals   Progression toward goals Progressing toward goals          SLP Education - 08/22/15 1619    Education provided Yes   Education Details planning and organizational strategies for problem solving   Person(s) Educated Patient   Methods Explanation;Demonstration   Comprehension Verbalized understanding;Returned demonstration            SLP Long Term Goals - 08/06/15 1554    SLP LONG TERM GOAL #1   Title Patient will demonstrate functional cognitive-communication skills for independent completion of personal responsibilities.   Status Partially Met   SLP LONG TERM GOAL #2   Title Patient will complete complex executive function skills tasks with 80% accuracy.   Status Partially Met   SLP LONG TERM GOAL #3   Title Patient will complete complex attention tasks with 80% accuracy.   Status Achieved   SLP LONG TERM GOAL #4   Title Patient will complete memory strategy activities with 80% accuracy.   Status Partially Met   SLP LONG TERM GOAL #5   Title Patient will generate grammatical and cogent sentences to complete abstract/complex linguistic tasks with 80% accuracy   Status Partially Met          Plan - 08/22/15 1620    Clinical Impression Statement Patient returns today with reports that she spoke with her mother and sister about taking more responsibility for management  of her medications and finances. Patient continues to show improvement on cognitive thinking abilities as evidenced by her performance on logic puzzles, working memory related exercises, and reading comprehension activities. She still requires some assistance when working through the more complex logic puzzles.    Speech Therapy Frequency 2x / week   Duration Other (comment)  12 weeks   Treatment/Interventions Compensatory techniques;Cognitive reorganization;Internal/external aids;Functional tasks;SLP instruction and feedback;Compensatory  strategies;Patient/family education   Potential to Achieve Goals Good   Potential Considerations Ability to learn/carryover information;Cooperation/participation level;Previous level of function;Severity of impairments;Family/community support   SLP Home Exercise Plan identify barriers for ADLs   Consulted and Agree with Plan of Care Patient      Patient will benefit from skilled therapeutic intervention in order to improve the following deficits and impairments:   Cognitive communication deficit    Problem List There are no active problems to display for this patient.   Wynelle Cleveland 08/22/2015, 4:21 PM  [place] MAIN [facility] SERVICES [address] [place], [place], [address] Phone: [phone]   Fax:  [phone]   Name: Natasha [name] MRN: [id number] Date of Birth: 04/29/1985
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
## 2015-08-27 ENCOUNTER — Encounter: Payer: 59 | Admitting: Speech Pathology

## 2015-08-28 ENCOUNTER — Ambulatory Visit: Payer: Commercial Managed Care - HMO | Attending: Pediatrics | Admitting: Speech Pathology

## 2015-08-28 ENCOUNTER — Ambulatory Visit: Payer: Commercial Managed Care - HMO | Admitting: Physical Therapy

## 2015-08-28 ENCOUNTER — Encounter: Payer: Self-pay | Admitting: Occupational Therapy

## 2015-08-28 ENCOUNTER — Ambulatory Visit: Payer: Commercial Managed Care - HMO | Admitting: Occupational Therapy

## 2015-08-28 ENCOUNTER — Encounter: Payer: Self-pay | Admitting: Physical Therapy

## 2015-08-28 DIAGNOSIS — R262 Difficulty in walking, not elsewhere classified: Secondary | ICD-10-CM | POA: Diagnosis present

## 2015-08-28 DIAGNOSIS — R278 Other lack of coordination: Secondary | ICD-10-CM | POA: Insufficient documentation

## 2015-08-28 DIAGNOSIS — R41841 Cognitive communication deficit: Secondary | ICD-10-CM | POA: Diagnosis not present

## 2015-08-28 DIAGNOSIS — M6281 Muscle weakness (generalized): Secondary | ICD-10-CM

## 2015-08-28 DIAGNOSIS — R2681 Unsteadiness on feet: Secondary | ICD-10-CM | POA: Insufficient documentation
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
## 2015-08-28 NOTE — Therapy (Signed)
[place] [facility] MAIN [facility] SERVICES [address] [place], [place], [address] Phone: [phone]   Fax:  [phone]  Physical Therapy Treatment  Patient Details  Name: ANTONIQUE LANGFORD MRN: [id number] Date of Birth: [birth year] Referring Provider: Doristine Mango  Encounter Date: 08/28/2015      PT End of Session - 08/28/15 1614    Visit Number 34   PT Start Time 0350   PT Stop Time 0430   PT Time Calculation (min) 40 min   Behavior During Therapy [facility] for tasks assessed/performed      Past Medical History  Diagnosis Date  . Restless leg syndrome unk  . Polysubstance abuse     Past Surgical History  Procedure Laterality Date  . Cholecystectomy    . Tubal ligation      There were no vitals filed for this visit.      Subjective Assessment - 08/28/15 1613    Subjective Patient is walking with loft strand crutches outside of the house and using rollaor inside the house. She walked into [employer] with B loftstrand crutches.    Currently in Pain? No/denies    Therapeutic exercise; SLR with 3 # x 20 x 2 BLE  SAQ with 3 # x 20 x 2 Bridging x 20 Hip abd with 3 # x 20 x 2 BLE  leg press with 90 lbs heel slides x 20 x 3  Gait training with 1  loft strand crutch Sit to stand x 10 x 2 Patient needs occasional verbal cueing to improve posture and cueing to correctly perform exercises slowly, holding at end of range to increase motor firing of desired muscle to encourage fatigue.                              PT Education - 08/28/15 1614    Education provided Yes   Education Details HEP, safety with 1 loftstrand crutch   Person(s) Educated Patient   Methods Explanation   Comprehension Verbalized understanding             PT Long Term Goals - 08/08/15 1647    PT LONG TERM GOAL #1   Title Patient will reduce timed up and go to <11 seconds to reduce fall risk and demonstrate improved transfer/gait ability.    Time 12   Period Weeks   Status On-going   PT LONG TERM GOAL #2   Title Patient will increase six minute walk test distance to >1000 for progression to community ambulator and improve gait ability   Time 12   Period Weeks   Status On-going   PT LONG TERM GOAL #3   Title Patient will increase 10 meter walk test to >1.79m/s as to improve gait speed for better community ambulation and to reduce fall    Time 12   Period Weeks   Status On-going   PT LONG TERM GOAL #4   Title Patient will tolerate 5 seconds of single leg stance without loss of balance to improve ability to get in and out of shower safely   Time 12   Period Weeks   Status On-going   PT LONG TERM GOAL #5   Title Patient (< 4 years old) will complete five times sit to stand test in < 10 seconds indicating an increased LE strength and improved balance   Time 12   Period Weeks   Status On-going  Plan - 08/28/15 1615    Clinical Impression Statement Min cueing needed during hip 3-way to maintain upright posture with knees straight. Pt had good performance of therapeutic exercise today.   Rehab Potential Good   PT Frequency 2x / week   PT Duration 12 weeks   PT Treatment/Interventions Therapeutic activities;Therapeutic exercise;Balance training;Neuromuscular re-education;Stair training;Gait training;Patient/family education   PT Next Visit Plan strengthening and balance training; gait training   PT Home Exercise Plan balance in corner   Consulted and Agree with Plan of Care Patient      Patient will benefit from skilled therapeutic intervention in order to improve the following deficits and impairments:  Abnormal gait, Decreased balance, Decreased endurance, Decreased mobility, Difficulty walking, Decreased cognition, Decreased safety awareness, Decreased strength, Impaired UE functional use, Obesity  Visit Diagnosis: Muscle weakness (generalized)  Difficulty in walking, not elsewhere  classified     Problem List There are no active problems to display for this patient. Ezekiel InaKristine S Barbi Kumagai, PT, DPT  [place], Barkley BrunsKristine S 08/28/2015, 4:17 PM  [place] [facility] MAIN [facility] SERVICES [address] [place], [place], [address] Phone: [phone]   Fax:  [phone]  Name: Hector ShadeWhitney B Heiner MRN: [id number] Date of Birth: 12/30/1985

## 2015-08-28 NOTE — Patient Instructions (Signed)
OT TREATMENT    Neuro muscular re-education: Pt. worked on [facility] with the right,[facility] and left hands grasping 1/4" pegs and placing them following a complex pattern. Pt. required cues at time to follow the pattern.  Selfcare: Pt. worked on home independence Electrical engineersituational safety awareness, and judgement worksheet for home management, and medical situations.  Pt. Requires cues to elaborate on the with multiple possible answers.

## 2015-08-29 ENCOUNTER — Encounter: Payer: Self-pay | Admitting: Speech Pathology

## 2015-08-29 NOTE — Therapy (Signed)
[place] MAIN [facility] SERVICES [address] [place], [place], [address] Phone: [phone]   Fax:  [phone]  Speech Language Pathology Treatment  Patient Details  Name: Natasha [name] MRN: [id number] Date of Birth: [birth year] Referring Provider: Dr. Janene Harvey  Encounter Date: 08/28/2015      End of Session - 08/29/15 0838    Visit Number 42   Number of Visits 49   Date for SLP Re-Evaluation 09/21/15   SLP Start Time 62   SLP Stop Time  1500   SLP Time Calculation (min) 60 min   Activity Tolerance Patient tolerated treatment well      Past Medical History  Diagnosis Date  . Restless leg syndrome unk  . Polysubstance abuse     Past Surgical History  Procedure Laterality Date  . Cholecystectomy    . Tubal ligation      There were no vitals filed for this visit.      Subjective Assessment - 08/29/15 0836    Subjective The patient feels that she is working harder / better and is remembering better as well.   Currently in Pain? No/denies               ADULT SLP TREATMENT - 08/29/15 0001    General Information   Behavior/Cognition Alert;Cooperative;Pleasant mood   HPI TBI   Treatment Provided   Treatment provided Cognitive-Linquistic   Pain Assessment   Pain Assessment No/denies pain   Cognitive-Linquistic Treatment   Treatment focused on Cognition   Skilled Treatment WORD FINDING:  Identify item that does not belong (abstract categories) with 80% accuracy.  Errors due primarily to decreased vocabulary knowledge; patient with improved response when she asks for definition of words or when she talks through meanings.  REASONING: Logic puzzle: requires min-mod cues to solve Perplexor Level A puzzle.  READING COMPREHENSION:  Read mystery story (Grade 6-8) and answer factual questions with 70% accuracy; improves with cues to literally locate answers in text vs. relying on memory.   Assessment / Recommendations / Plan   Plan Continue with current plan of care   Progression Toward Goals   Progression toward goals Progressing toward goals          SLP Education - 08/29/15 0837    Education provided Yes   Education Details Cognitive compensatory strategies   Person(s) Educated Patient   Methods Explanation   Comprehension Verbalized understanding            SLP Long Term Goals - 08/06/15 1554    SLP LONG TERM GOAL #1   Title Patient will demonstrate functional cognitive-communication skills for independent completion of personal responsibilities.   Status Partially Met   SLP LONG TERM GOAL #2   Title Patient will complete complex executive function skills tasks with 80% accuracy.   Status Partially Met   SLP LONG TERM GOAL #3   Title Patient will complete complex attention tasks with 80% accuracy.   Status Achieved   SLP LONG TERM GOAL #4   Title Patient will complete memory strategy activities with 80% accuracy.   Status Partially Met   SLP LONG TERM GOAL #5   Title Patient will generate grammatical and cogent sentences to complete abstract/complex linguistic tasks with 80% accuracy   Status Partially Met          Plan - 08/29/15 0839    Clinical Impression Statement The patient is attending well to simple therapeutic tasks and is exhibiting less frustration with  more difficult tasks.  She is having word retrieval problems, abstract thinking problems, and visual perceptual problems.  She enjoys language tasks and is better able to independently identify and problem solve errors.  The patient has identified returning to independent living as her primary goal.  We will continue to determine cognitive barriers to independence and devise therapeutic tasks to increase safe independence.   Speech Therapy Frequency 2x / week   Duration Other (comment)   Treatment/Interventions Compensatory techniques;Cognitive reorganization;Internal/external aids;Functional tasks;SLP instruction and  feedback;Compensatory strategies;Patient/family education   Potential to Achieve Goals Good   Potential Considerations Ability to learn/carryover information;Cooperation/participation level;Previous level of function;Severity of impairments;Family/community support   SLP Home Exercise Plan identify barriers for ADLs   Consulted and Agree with Plan of Care Patient      Patient will benefit from skilled therapeutic intervention in order to improve the following deficits and impairments:   Cognitive communication deficit    Problem List There are no active problems to display for this patient.  Leroy Sea, MS/CCC- SLP  Lou Miner 08/29/2015, 8:41 AM  [place] MAIN [facility] SERVICES [address] [place], [place], [address] Phone: [phone]   Fax:  [phone]   Name: Natasha [name] MRN: [id number] Date of Birth: 04/20/1986

## 2015-08-30 ENCOUNTER — Ambulatory Visit: Payer: Commercial Managed Care - HMO | Admitting: Physical Therapy

## 2015-08-30 ENCOUNTER — Ambulatory Visit: Payer: Commercial Managed Care - HMO | Admitting: Occupational Therapy

## 2015-08-30 ENCOUNTER — Encounter: Payer: Self-pay | Admitting: Physical Therapy

## 2015-08-30 ENCOUNTER — Ambulatory Visit: Payer: Commercial Managed Care - HMO | Admitting: Speech Pathology

## 2015-08-30 DIAGNOSIS — R41841 Cognitive communication deficit: Secondary | ICD-10-CM | POA: Diagnosis not present

## 2015-08-30 DIAGNOSIS — R262 Difficulty in walking, not elsewhere classified: Secondary | ICD-10-CM

## 2015-08-30 DIAGNOSIS — R278 Other lack of coordination: Secondary | ICD-10-CM

## 2015-08-30 DIAGNOSIS — M6281 Muscle weakness (generalized): Secondary | ICD-10-CM
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
## 2015-08-30 NOTE — Patient Instructions (Addendum)
OT TREATMENT    Self-care: Pt. worked on check writing tasks including: filling out the checks, the check register, and calculating/subtracting the dollar amounts. Pt. Requires cues for each step, and assist for  Subtracting numbers.

## 2015-08-30 NOTE — Therapy (Signed)
[place] [facility] SERVICES [address] [place], [place], [address] Phone: [phone]   Fax:  [phone]  Physical Therapy Treatment  Patient Details  Name: Natasha [name] MRN: [id number] Date of Birth: Sep 08, 1985 Referring Provider: Doristine Mango  Encounter Date: 08/30/2015      PT End of Session - 08/30/15 1327    Visit Number 35   PT Start Time 0100   PT Stop Time 0145   PT Time Calculation (min) 45 min   Equipment Utilized During Treatment Gait belt   Behavior During Therapy [facility] for tasks assessed/performed      Past Medical History  Diagnosis Date  . Restless leg syndrome unk  . Polysubstance abuse     Past Surgical History  Procedure Laterality Date  . Cholecystectomy    . Tubal ligation      There were no vitals filed for this visit.      Subjective Assessment - 08/30/15 1327    Subjective Patient is walking with loft strand crutches outside of the house and using rollaor inside the house.      Gait training with loft strand crutches outside on uneven surfaces, inclines, pepbles and cobble stone with CGA 1000 feet x 5  therapeutic exericses: standing hip abd with YTB x 20  side stepping left and right in parallel bars 10 feet x 3 standing on blue foam with step ups to 6 inch stool x 20 across midline step ups from floor to 6 inch stool x 20 bilateral sit to stand x 10 marching in parallel bars x 20 Patient continues to demonstrates less incoordination of movement with select exercises. Patient responds well to verbal and tactile cues to correct form and technique. Motor control of LE much improved.  Muscle fatigue but no major pain complaints.                           PT Education - 08/30/15 1327    Education provided Yes   Education Details safety with inclines   Person(s) Educated Patient   Methods Explanation   Comprehension Verbalized understanding             PT  Long Term Goals - 08/08/15 1647    PT LONG TERM GOAL #1   Title Patient will reduce timed up and go to <11 seconds to reduce fall risk and demonstrate improved transfer/gait ability.   Time 12   Period Weeks   Status On-going   PT LONG TERM GOAL #2   Title Patient will increase six minute walk test distance to >1000 for progression to community ambulator and improve gait ability   Time 12   Period Weeks   Status On-going   PT LONG TERM GOAL #3   Title Patient will increase 10 meter walk test to >1.53m/s as to improve gait speed for better community ambulation and to reduce fall    Time 12   Period Weeks   Status On-going   PT LONG TERM GOAL #4   Title Patient will tolerate 5 seconds of single leg stance without loss of balance to improve ability to get in and out of shower safely   Time 12   Period Weeks   Status On-going   PT LONG TERM GOAL #5   Title Patient (< 54 years old) will complete five times sit to stand test in < 10 seconds indicating an increased LE strength and improved balance  Time 12   Period Weeks   Status On-going               Plan - 08/30/15 1328    Clinical Impression Statement Motor control of LE much improved.  Muscle fatigue but no major pain complaints   Rehab Potential Good   PT Frequency 2x / week   PT Duration 12 weeks   PT Treatment/Interventions Therapeutic activities;Therapeutic exercise;Balance training;Neuromuscular re-education;Stair training;Gait training;Patient/family education   PT Next Visit Plan strengthening and balance training; gait training   PT Home Exercise Plan balance in corner   Consulted and Agree with Plan of Care Patient      Patient will benefit from skilled therapeutic intervention in order to improve the following deficits and impairments:  Abnormal gait, Decreased balance, Decreased endurance, Decreased mobility, Difficulty walking, Decreased cognition, Decreased safety awareness, Decreased strength, Impaired UE  functional use, Obesity  Visit Diagnosis: Muscle weakness (generalized)  Difficulty in walking, not elsewhere classified     Problem List There are no active problems to display for this patient.  Natasha [name], PT, DPT [place] 08/30/2015, 1:29 PM  [facility] MAIN [facility] [address] [place], [place], [address] Phone: [phone]   Fax:  [phone]  Name: Natasha [name] MRN: [id number] Date of Birth: 07/15/1985

## 2015-08-31 ENCOUNTER — Encounter: Payer: Self-pay | Admitting: Speech Pathology

## 2015-08-31 NOTE — Therapy (Signed)
[place] MAIN [facility] SERVICES [place], [place], [address] Phone: [phone]   Fax:  [phone]  Speech Language Pathology Treatment  Patient Details  Name: Natasha [name] MRN: [id number] Date of Birth: [birth year] Referring Provider: Dr. Janene Harvey  Encounter Date: 08/30/2015      End of Session - 08/31/15 1045    Visit Number 43   Number of Visits 23   Date for SLP Re-Evaluation 09/21/15   SLP Start Time 63   SLP Stop Time  1500   SLP Time Calculation (min) 60 min   Activity Tolerance Patient tolerated treatment well      Past Medical History  Diagnosis Date  . Restless leg syndrome unk  . Polysubstance abuse     Past Surgical History  Procedure Laterality Date  . Cholecystectomy    . Tubal ligation      There were no vitals filed for this visit.      Subjective Assessment - 08/31/15 1044    Subjective The patient feels that she is working harder / better and is remembering better as well.   Currently in Pain? No/denies               ADULT SLP TREATMENT - 08/31/15 0001    General Information   Behavior/Cognition Alert;Cooperative;Pleasant mood   HPI TBI   Treatment Provided   Treatment provided Cognitive-Linquistic   Pain Assessment   Pain Assessment No/denies pain   Cognitive-Linquistic Treatment   Treatment focused on Cognition   Skilled Treatment REASONING: Logic puzzle: requires min-mod cues to solve Perplexor Level A puzzle.  READING COMPREHENSION:  Read mystery story (Grade 6-8) and answer factual questions with 70% accuracy; improves with cues to literally locate answers in text vs. relying on memory.  MEMORY: Recalled fact from last session.  Is recalling "rules" for solving Perplor puzzles with fewer cues from SLP.   Assessment / Recommendations / Plan   Plan Continue with current plan of care   Progression Toward Goals   Progression toward goals Progressing toward goals           SLP Education - 08/31/15 1044    Education provided Yes   Education Details compensatory strategies   Person(s) Educated Patient   Methods Explanation   Comprehension Verbalized understanding            SLP Long Term Goals - 08/06/15 1554    SLP LONG TERM GOAL #1   Title Patient will demonstrate functional cognitive-communication skills for independent completion of personal responsibilities.   Status Partially Met   SLP LONG TERM GOAL #2   Title Patient will complete complex executive function skills tasks with 80% accuracy.   Status Partially Met   SLP LONG TERM GOAL #3   Title Patient will complete complex attention tasks with 80% accuracy.   Status Achieved   SLP LONG TERM GOAL #4   Title Patient will complete memory strategy activities with 80% accuracy.   Status Partially Met   SLP LONG TERM GOAL #5   Title Patient will generate grammatical and cogent sentences to complete abstract/complex linguistic tasks with 80% accuracy   Status Partially Met          Plan - 08/31/15 1045    Clinical Impression Statement The patient is attending well to simple therapeutic tasks and is exhibiting less frustration with more difficult tasks.  She is having word retrieval problems, abstract thinking problems, and visual perceptual problems.  She enjoys  language tasks and is better able to independently identify and problem solve errors.  The patient has identified returning to independent living as her primary goal.  We will continue to determine cognitive barriers to independence and devise therapeutic tasks to increase safe independence.   Speech Therapy Frequency 2x / week   Duration Other (comment)   Treatment/Interventions Compensatory techniques;Cognitive reorganization;Internal/external aids;Functional tasks;SLP instruction and feedback;Compensatory strategies;Patient/family education   Potential to Achieve Goals Good   Potential Considerations Ability to learn/carryover  information;Cooperation/participation level;Previous level of function;Severity of impairments;Family/community support   SLP Home Exercise Plan identify barriers for ADLs   Consulted and Agree with Plan of Care Patient      Patient will benefit from skilled therapeutic intervention in order to improve the following deficits and impairments:   Cognitive communication deficit    Problem List There are no active problems to display for this patient.  Leroy Sea, MS/CCC- SLP  Lou Miner 08/31/2015, 10:46 AM  [place] MAIN [facility] SERVICES [address] [place], [place], [address] Phone: [phone]   Fax:  [phone]   Name: Natasha [name] MRN: [id number] Date of Birth: January 11, 1986

## 2015-09-03 ENCOUNTER — Ambulatory Visit: Payer: Commercial Managed Care - HMO | Admitting: Occupational Therapy

## 2015-09-03 ENCOUNTER — Ambulatory Visit: Payer: Commercial Managed Care - HMO | Admitting: Physical Therapy

## 2015-09-03 ENCOUNTER — Ambulatory Visit: Payer: Commercial Managed Care - HMO | Admitting: Speech Pathology

## 2015-09-03 ENCOUNTER — Encounter: Payer: Self-pay | Admitting: Physical Therapy

## 2015-09-03 DIAGNOSIS — M6281 Muscle weakness (generalized): Secondary | ICD-10-CM

## 2015-09-03 DIAGNOSIS — R41841 Cognitive communication deficit: Secondary | ICD-10-CM

## 2015-09-03 NOTE — Patient Instructions (Signed)
OT TREATMENT    Neuro muscular re-education: Pt. worked on Biomedical scientistsmall Parquetry designs. Pt. Was able to grasp and place the design pieces. Pt. Required cues for more complex designs.  Therapeutic Exercise: pt. worked out on the [employer] UBE for 11 min. Pt. Worked on forward and reverse motions with resistances. Constant monitoring of the UEs on the handles was provided throughout the session. Pt. Was able to complete it with distraction.  Selfcare: Pt. worked on calculating, and subtracting amounts on a check registry. Pt. requires verbal cues for the process of subtracting.
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
## 2015-09-04 ENCOUNTER — Encounter: Payer: Self-pay | Admitting: Speech Pathology

## 2015-09-04 NOTE — Therapy (Signed)
[place] MAIN [facility] SERVICES [place], [place], [address] Phone: [phone]   Fax:  [phone]  Speech Language Pathology Treatment  Patient Details  Name: Natasha [name] MRN: [id number] Date of Birth: 1985-07-13 Referring Provider: Dr. Janene Harvey  Encounter Date: 09/03/2015      End of Session - 09/04/15 0840    Visit Number 44   Number of Visits 49   Date for SLP Re-Evaluation 09/21/15   SLP Start Time 1500   SLP Stop Time  1600   SLP Time Calculation (min) 60 min   Activity Tolerance Patient tolerated treatment well      Past Medical History  Diagnosis Date  . Restless leg syndrome unk  . Polysubstance abuse     Past Surgical History  Procedure Laterality Date  . Cholecystectomy    . Tubal ligation      There were no vitals filed for this visit.      Subjective Assessment - 09/04/15 0840    Subjective The patient feels that she is working harder / better and is remembering better as well.   Currently in Pain? No/denies               ADULT SLP TREATMENT - 09/04/15 0001    General Information   Behavior/Cognition Alert;Cooperative;Pleasant mood   HPI TBI   Treatment Provided   Treatment provided Cognitive-Linquistic   Pain Assessment   Pain Assessment No/denies pain   Cognitive-Linquistic Treatment   Treatment focused on Cognition   Skilled Treatment REASONING: Logic puzzle: requires min-mod cues to solve Perplexor Level A puzzle.  READING COMPREHENSION:  Read mystery story (Grade 6-8) and answer factual questions with 70% accuracy; improves with cues to literally locate answers in text vs. relying on memory.  MEMORY: Recalled fact from last session.  Is recalling "rules" for solving Perplor puzzles with fewer cues from SLP.   Assessment / Recommendations / Plan   Plan Continue with current plan of care   Progression Toward Goals   Progression toward goals Progressing toward goals           SLP Education - 09/04/15 0840    Education provided Yes   Education Details Strategies to improve cognitive independence   Person(s) Educated Patient   Methods Explanation   Comprehension Verbalized understanding            SLP Long Term Goals - 08/06/15 1554    SLP LONG TERM GOAL #1   Title Patient will demonstrate functional cognitive-communication skills for independent completion of personal responsibilities.   Status Partially Met   SLP LONG TERM GOAL #2   Title Patient will complete complex executive function skills tasks with 80% accuracy.   Status Partially Met   SLP LONG TERM GOAL #3   Title Patient will complete complex attention tasks with 80% accuracy.   Status Achieved   SLP LONG TERM GOAL #4   Title Patient will complete memory strategy activities with 80% accuracy.   Status Partially Met   SLP LONG TERM GOAL #5   Title Patient will generate grammatical and cogent sentences to complete abstract/complex linguistic tasks with 80% accuracy   Status Partially Met          Plan - 09/04/15 0841    Clinical Impression Statement The patient is attending well to simple therapeutic tasks and is exhibiting less frustration with more difficult tasks.  She is having word retrieval problems, abstract thinking problems, and visual perceptual problems.  She enjoys language tasks and is better able to independently identify and problem solve errors.  The patient has identified returning to independent living as her primary goal.  We will continue to determine cognitive barriers to independence and devise therapeutic tasks to increase safe independence.      Patient will benefit from skilled therapeutic intervention in order to improve the following deficits and impairments:   Cognitive communication deficit    Problem List There are no active problems to display for this patient.  Leroy Sea, MS/CCC- SLP  Lou Miner 09/04/2015, 8:42 AM  [place] MAIN [facility] SERVICES [address] [place], [place], [address] Phone: [phone]   Fax:  [phone]   Name: Natasha [name] MRN: [id number] Date of Birth: 1985/06/18

## 2015-09-05 ENCOUNTER — Ambulatory Visit: Payer: Commercial Managed Care - HMO | Admitting: Occupational Therapy

## 2015-09-05 ENCOUNTER — Encounter: Payer: Self-pay | Admitting: Physical Therapy

## 2015-09-05 ENCOUNTER — Ambulatory Visit: Payer: Commercial Managed Care - HMO | Admitting: Physical Therapy

## 2015-09-05 ENCOUNTER — Ambulatory Visit: Payer: Commercial Managed Care - HMO | Admitting: Speech Pathology

## 2015-09-05 DIAGNOSIS — R41841 Cognitive communication deficit: Secondary | ICD-10-CM

## 2015-09-05 DIAGNOSIS — M6281 Muscle weakness (generalized): Secondary | ICD-10-CM

## 2015-09-05 DIAGNOSIS — R278 Other lack of coordination: Secondary | ICD-10-CM

## 2015-09-05 NOTE — Patient Instructions (Signed)
OT TREATMENT    Neuro muscular re-education: [facility]. performed [facility] skills training to improve speed and dexterity needed for ADL tasks and writing. Pt. demonstrated grasping 1 inch sticks,  inch cylindrical collars, and  inch flat washers on the Purdue pegboard. Pt. performed grasping each item with her 2nd digit and thumb, and storing them in the palm. Pt. presented with difficulty storing  inch objects at a time in the palmar aspect of the hand.  Selfcare: Pt. worked on counting and adding money amounts with bills and coins. Pt. was able to complete with minimal cues. Pt. worked calculating subtraction for dollar amounts in preparation for check writing. Pt. required cues for the process of subtracting, and organization.

## 2015-09-05 NOTE — Therapy (Signed)
[place] [facility] MA[facility] [address] [place], [place], [address] Phone: [phone]   Fax:  [phone]  Physical Therapy Treatment  Patient Details  Name: Natasha ShadeWhitney B [name] MRN: [id number] Date of Birth: 06/17/1985 Referring Provider: Doristine MangoWhite elizabeth  Encounter Date: 09/05/2015      PT End of Session - 09/05/15 1711    Visit Number 36   Number of Visits 49   Date for PT Re-Evaluation 10/31/15   PT Start Time 0450   PT Stop Time 0530   PT Time Calculation (min) 40 min   Behavior During Therapy [facility] for tasks assessed/performed      Past Medical History  Diagnosis Date  . Restless leg syndrome unk  . Polysubstance abuse     Past Surgical History  Procedure Laterality Date  . Cholecystectomy    . Tubal ligation      There were no vitals filed for this visit.      Subjective Assessment - 09/05/15 1710    Subjective Patient is walking with loft strand crutches outside of the house and using rollaor inside the house.   Currently in Pain? No/denies     Neuromuscular training: Side stepping left and right in parallel bars and fwd/bwd on blue foam  Stepping fwd and backs over 1/2 foam  Step ups from blue foam to 6 inch stool Walking in parallel bars without UE support x 100 feet   Gait training on 1 loftstrand crutches with UE and no oss of balance  For 1000 feet x 3.   Patient continues to demonstrates less incoordination of movement. Patient responds well to verbal and tactile cues to correct form and technique. Motor control of LE much improved.  Muscle fatigue but no major pain complaints.                            PT Education - 09/05/15 1711    Education provided Yes   Education Details balance and safety   Person(s) Educated Patient   Methods Explanation   Comprehension Verbalized understanding             PT Long Term Goals - 08/08/15 1647    PT LONG TERM GOAL #1   Title  Patient will reduce timed up and go to <11 seconds to reduce fall risk and demonstrate improved transfer/gait ability.   Time 12   Period Weeks   Status On-going   PT LONG TERM GOAL #2   Title Patient will increase six minute walk test distance to >1000 for progression to community ambulator and improve gait ability   Time 12   Period Weeks   Status On-going   PT LONG TERM GOAL #3   Title Patient will increase 10 meter walk test to >1.4312m/s as to improve gait speed for better community ambulation and to reduce fall    Time 12   Period Weeks   Status On-going   PT LONG TERM GOAL #4   Title Patient will tolerate 5 seconds of single leg stance without loss of balance to improve ability to get in and out of shower safely   Time 12   Period Weeks   Status On-going   PT LONG TERM GOAL #5   Title Patient (< 30 years old) will complete five times sit to stand test in < 10 seconds indicating an increased LE strength and improved balance   Time 12   Period  Weeks   Status On-going               Plan - 09/05/15 1714    Clinical Impression Statement Fatigue with sit to stand but demonstrating more control, Increase weight for standing exercises. Fatigue still evident with cross trainer and endurance.    Rehab Potential Good   PT Frequency 2x / week   PT Duration 12 weeks   PT Treatment/Interventions Therapeutic activities;Therapeutic exercise;Balance training;Neuromuscular re-education;Stair training;Gait training;Patient/family education   PT Next Visit Plan strengthening and balance training; gait training   PT Home Exercise Plan balance in corner   Consulted and Agree with Plan of Care Patient      Patient will benefit from skilled therapeutic intervention in order to improve the following deficits and impairments:  Abnormal gait, Decreased balance, Decreased endurance, Decreased mobility, Difficulty walking, Decreased cognition, Decreased safety awareness, Decreased strength,  Impaired UE functional use, Obesity  Visit Diagnosis: Muscle weakness (generalized)     Problem List There are no active problems to display for this patient.  [name], PT, DPT [employer], Barkley Bruns S 09/05/2015, 5:24 PM  [place] [facility] MAIN [facility] SERVICES [address] [place], [place], [address] Phone: [phone]   Fax:  [phone]  Name: Natasha [name] MRN: [id number] Date of Birth: 02-11-1986
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
## 2015-09-06 ENCOUNTER — Encounter: Payer: Self-pay | Admitting: Speech Pathology

## 2015-09-06 NOTE — Therapy (Signed)
[place] MAIN [facility] SERVICES [address] [place], [place], [address] Phone: [phone]   Fax:  [phone]  Speech Language Pathology Treatment  Patient Details  Name: Natasha [name] MRN: [id number] Date of Birth: [birth year] Referring Provider: Dr. Janene Harvey  Encounter Date: 09/05/2015      End of Session - 09/06/15 1635    Visit Number 46   Number of Visits 70   Date for SLP Re-Evaluation 09/21/15   SLP Start Time 1500   SLP Stop Time  1600   SLP Time Calculation (min) 60 min   Activity Tolerance Patient tolerated treatment well      Past Medical History  Diagnosis Date  . Restless leg syndrome unk  . Polysubstance abuse     Past Surgical History  Procedure Laterality Date  . Cholecystectomy    . Tubal ligation      There were no vitals filed for this visit.      Subjective Assessment - 09/06/15 1635    Subjective The patient feels that she is working harder / better and is remembering better as well.   Currently in Pain? No/denies               ADULT SLP TREATMENT - 09/06/15 0001    General Information   Behavior/Cognition Alert;Cooperative;Pleasant mood   HPI TBI   Treatment Provided   Treatment provided Cognitive-Linquistic   Pain Assessment   Pain Assessment No/denies pain   Cognitive-Linquistic Treatment   Treatment focused on Cognition   Skilled Treatment REASONING: Logic puzzle: requires min-mod cues to solve Perplexor Level A puzzle.  READING COMPREHENSION:  Read mystery story (Grade 6-8) and answer factual questions with 70% accuracy; improves with cues to literally locate answers in text vs. relying on memory.  Solved mystery independently.  MEMORY: Recalled fact from last session.  Requires fewer cues to locate answers in test.  Is recalling "rules" for solving Perplor puzzles with fewer cues from SLP.   Assessment / Recommendations / Plan   Plan Continue with current plan of care   Progression  Toward Goals   Progression toward goals Progressing toward goals          SLP Education - 09/06/15 1635    Education provided Yes   Education Details Cognitive strategies to improve independence   Person(s) Educated Patient   Methods Explanation   Comprehension Verbalized understanding            SLP Long Term Goals - 08/06/15 1554    SLP LONG TERM GOAL #1   Title Patient will demonstrate functional cognitive-communication skills for independent completion of personal responsibilities.   Status Partially Met   SLP LONG TERM GOAL #2   Title Patient will complete complex executive function skills tasks with 80% accuracy.   Status Partially Met   SLP LONG TERM GOAL #3   Title Patient will complete complex attention tasks with 80% accuracy.   Status Achieved   SLP LONG TERM GOAL #4   Title Patient will complete memory strategy activities with 80% accuracy.   Status Partially Met   SLP LONG TERM GOAL #5   Title Patient will generate grammatical and cogent sentences to complete abstract/complex linguistic tasks with 80% accuracy   Status Partially Met          Plan - 09/06/15 1636    Clinical Impression Statement The patient is attending well to simple therapeutic tasks and is exhibiting less frustration with more difficult tasks.  She is having word retrieval problems, abstract thinking problems, and visual perceptual problems.  She enjoys language tasks and is better able to independently identify and problem solve errors.  The patient has identified returning to independent living as her primary goal.  We will continue to determine cognitive barriers to independence and devise therapeutic tasks to increase safe independence.      Patient will benefit from skilled therapeutic intervention in order to improve the following deficits and impairments:   Cognitive communication deficit    Problem List There are no active problems to display for this patient.  Leroy Sea, MS/CCC- SLP  Lou Miner 09/06/2015, 4:37 PM  [place] MAIN [facility] SERVICES [address] [place], [place], [address] Phone: [phone]   Fax:  [phone]   Name: LATANJA LEHENBAUER MRN: [id number] Date of Birth: December 21, 1985

## 2015-09-06 NOTE — Therapy (Signed)
[facility] [name] [address] [place], [place], [address] Phone: [phone]   Fax:  [phone]  Physical Therapy Treatment  Patient Details  Name: Natasha [name] MRN: [id number] Date of Birth: 02/28/1986 Referring Provider: Doristine [name] elizabeth  Encounter Date: 09/03/2015      PT End of Session - 09/05/15 1711    Visit Number 36   Number of Visits 49   Date for PT Re-Evaluation 10/31/15   PT Start Time 0450   PT Stop Time 0530   PT Time Calculation (min) 40 min   Behavior During Therapy [facility] for tasks assessed/performed      Past Medical History  Diagnosis Date  . Restless leg syndrome unk  . Polysubstance abuse     Past Surgical History  Procedure Laterality Date  . Cholecystectomy    . Tubal ligation      There were no vitals filed for this visit.      Subjective Assessment - 09/03/15 1710    Subjective Patient is walking with loft strand crutches outside of the house and using rollaor inside the house.   Currently in Pain? No/denies      standing hip abd with YTB x 20  side stepping left and right in parallel bars 10 feet x 3 standing on blue foam with cone reaching x 20 across midline step ups from floor to 6 inch stool x 20 bilateral sit to stand x 10 marching in parallel bars x 20 Gait training with loft strand crutch and no loss of balance.  Patient needs occasional verbal cueing to improve posture and cueing to correctly perform exercises slowly, holding at end of range to increase motor firing of desired muscle to encourage fatigue.                             PT Education - 09/05/15 1711    Education provided Yes   Education Details balance and safety   Person([name]) Educated Patient   Methods Explanation   Comprehension Verbalized understanding             PT Long Term Goals - 08/08/15 1647    PT LONG TERM GOAL #1   Title Patient will reduce timed up and go to <11  seconds to reduce fall risk and demonstrate improved transfer/gait ability.   Time 12   Period Weeks   Status On-going   PT LONG TERM GOAL #2   Title Patient will increase six minute walk test distance to >1000 for progression to community ambulator and improve gait ability   Time 12   Period Weeks   Status On-going   PT LONG TERM GOAL #3   Title Patient will increase 10 meter walk test to >1.5262m/[name] as to improve gait speed for better community ambulation and to reduce fall    Time 12   Period Weeks   Status On-going   PT LONG TERM GOAL #4   Title Patient will tolerate 5 seconds of single leg stance without loss of balance to improve ability to get in and out of shower safely   Time 12   Period Weeks   Status On-going   PT LONG TERM GOAL #5   Title Patient (< 30 years old) will complete five times sit to stand test in < 10 seconds indicating an increased LE strength and improved balance   Time 12   Period Weeks   Status  On-going               Plan - 09/05/15 1714    Clinical Impression Statement Fatigue with sit to stand but demonstrating more control, Increase weight for standing exercises. Fatigue still evident with cross trainer and endurance.    Rehab Potential Good   PT Frequency 2x / week   PT Duration 12 weeks   PT Treatment/Interventions Therapeutic activities;Therapeutic exercise;Balance training;Neuromuscular re-education;Stair training;Gait training;Patient/family education   PT Next Visit Plan strengthening and balance training; gait training   PT Home Exercise Plan balance in corner   Consulted and Agree with Plan of Care Patient      Patient will benefit from skilled therapeutic intervention in order to improve the following deficits and impairments:  Abnormal gait, Decreased balance, Decreased endurance, Decreased mobility, Difficulty walking, Decreased cognition, Decreased safety awareness, Decreased strength, Impaired UE functional use, Obesity  Visit  Diagnosis: Muscle weakness (generalized)     Problem List There are no active problems to display for this patient. Natasha [name], PT, DPT  Natasha [name], Natasha [name] 09/06/2015, 5:58 PM  [name] [address] [place], [place], [address] Phone: [phone]   Fax:  [phone]  Name: Natasha [name] MRN: [id number] Date of Birth: 11-17-1985

## 2015-09-10 ENCOUNTER — Ambulatory Visit: Payer: Commercial Managed Care - HMO | Admitting: Occupational Therapy

## 2015-09-10 ENCOUNTER — Ambulatory Visit: Payer: Commercial Managed Care - HMO | Admitting: Speech Pathology

## 2015-09-10 ENCOUNTER — Ambulatory Visit: Payer: Commercial Managed Care - HMO | Admitting: Physical Therapy

## 2015-09-10 ENCOUNTER — Encounter: Payer: Self-pay | Admitting: Physical Therapy

## 2015-09-10 DIAGNOSIS — R2681 Unsteadiness on feet: Secondary | ICD-10-CM

## 2015-09-10 DIAGNOSIS — R41841 Cognitive communication deficit: Secondary | ICD-10-CM | POA: Diagnosis not present

## 2015-09-10 DIAGNOSIS — R262 Difficulty in walking, not elsewhere classified: Secondary | ICD-10-CM

## 2015-09-10 DIAGNOSIS — R278 Other lack of coordination: Secondary | ICD-10-CM

## 2015-09-10 DIAGNOSIS — M6281 Muscle weakness (generalized): Secondary | ICD-10-CM
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
## 2015-09-10 NOTE — Therapy (Signed)
[place] [facility] MAIN [facility] SERVICES [address] [place], [place], [address] Phone: [phone]   Fax:  [phone]  Physical Therapy Treatment  Patient Details  Name: Natasha [name] MRN: [id number] Date of Birth: 10/19/1985 Referring Provider: Doristine MangoWhite elizabeth  Encounter Date: 09/10/2015      PT End of Session - 09/10/15 1600    Visit Number 37   Number of Visits 49   PT Start Time 0320   PT Stop Time 0400   PT Time Calculation (min) 40 min   Activity Tolerance Patient tolerated treatment well      Past Medical History  Diagnosis Date  . Restless leg syndrome unk  . Polysubstance abuse     Past Surgical History  Procedure Laterality Date  . Cholecystectomy    . Tubal ligation      There were no vitals filed for this visit.      Subjective Assessment - 09/10/15 1559    Subjective Patient is practicing walking in the hall wihtout AD inside.    Currently in Pain? No/denies     Gait training wiithout AD in hallway near the wall 400 feet, 600 feet, 200 feet with rest periods Gait training in parallel bars 10 x 5 without UE support TM walking x 10 minutes 1. 2 miles/hour  Leg press x 20 x 3 and heel raises x 20 x 3  Step ups x 20 x 2 4 way hip BLE RTB  X 20  mod verbal cues used throughout with increased in postural sway and LOB most seen with narrow base of support. Continues to have balance deficits typical with diagnosis. Patient performs intermediate level exercises without pain behaviors and needs verbal cuing for postural alignment and head positioning                             PT Education - 09/10/15 1559    Education provided Yes   Education Details safety without AD   Person(s) Educated Patient   Methods Explanation   Comprehension Verbalized understanding             PT Long Term Goals - 08/08/15 1647    PT LONG TERM GOAL #1   Title Patient will reduce timed up and go to <11  seconds to reduce fall risk and demonstrate improved transfer/gait ability.   Time 12   Period Weeks   Status On-going   PT LONG TERM GOAL #2   Title Patient will increase six minute walk test distance to >1000 for progression to community ambulator and improve gait ability   Time 12   Period Weeks   Status On-going   PT LONG TERM GOAL #3   Title Patient will increase 10 meter walk test to >1.2160m/s as to improve gait speed for better community ambulation and to reduce fall    Time 12   Period Weeks   Status On-going   PT LONG TERM GOAL #4   Title Patient will tolerate 5 seconds of single leg stance without loss of balance to improve ability to get in and out of shower safely   Time 12   Period Weeks   Status On-going   PT LONG TERM GOAL #5   Title Patient (< 30 years old) will complete five times sit to stand test in < 10 seconds indicating an increased LE strength and improved balance   Time 12   Period Weeks  Status On-going               Plan - 09/10/15 1600    Clinical Impression Statement Patient is able to ambulate short distances without AD today near a wall for safety.    Rehab Potential Good   PT Frequency 2x / week   PT Duration 12 weeks   PT Treatment/Interventions Therapeutic activities;Therapeutic exercise;Balance training;Neuromuscular re-education;Stair training;Gait training;Patient/family education   PT Next Visit Plan strengthening and balance training; gait training   PT Home Exercise Plan balance in corner   Consulted and Agree with Plan of Care Patient      Patient will benefit from skilled therapeutic intervention in order to improve the following deficits and impairments:  Abnormal gait, Decreased balance, Decreased endurance, Decreased mobility, Difficulty walking, Decreased cognition, Decreased safety awareness, Decreased strength, Impaired UE functional use, Obesity  Visit Diagnosis: Muscle weakness (generalized)  Unsteadiness on  feet  Difficulty in walking, not elsewhere classified     Problem List There are no active problems to display for this patient.  [name], PT, DPT [place], [place] S 09/10/2015, 4:03 PM  [place] [facility] MAIN [facility] SERVICES [address] [place], [place], [address] Phone: [phone]   Fax:  [phone]  Name: Natasha [name] MRN: [id number] Date of Birth: [birth year]

## 2015-09-10 NOTE — Patient Instructions (Signed)
OT TREATMENT    Neuro muscular re-education: Pt. worked on grasping and placing 1/8 inch objects with hand tweezers.  Pt. worked on grasping mini clips with thumb to 2nd digit through 5th digits. Pt. Requires cues and increased time for manipulating small objects. Cues Selfcare: Pt. worked on Engineer, civil (consulting)calculating/subtracting dollars amounts in preparation for completing a check registry. Pt. Requires visual demonstration, verbal, cues, and increased time to complete.

## 2015-09-11 ENCOUNTER — Encounter: Payer: Self-pay | Admitting: Speech Pathology

## 2015-09-11 NOTE — Therapy (Signed)
[place] MAIN [facility] SERVICES [place], [place], [address] Phone: [phone]   Fax:  [phone]  Speech Language Pathology Treatment  Patient Details  Name: Natasha [name] MRN: [id number] Date of Birth: 1985/12/10 Referring Provider: Dr. Janene Harvey  Encounter Date: 09/10/2015      End of Session - 09/11/15 0820    Visit Number 47   Number of Visits 49   Date for SLP Re-Evaluation 09/21/15   SLP Start Time 73   SLP Stop Time  1500   SLP Time Calculation (min) 60 min   Activity Tolerance Patient tolerated treatment well      Past Medical History  Diagnosis Date  . Restless leg syndrome unk  . Polysubstance abuse     Past Surgical History  Procedure Laterality Date  . Cholecystectomy    . Tubal ligation      There were no vitals filed for this visit.      Subjective Assessment - 09/11/15 0819    Subjective The patient feels that she is working harder / better and is remembering better as well.   Currently in Pain? No/denies               ADULT SLP TREATMENT - 09/11/15 0001    General Information   Behavior/Cognition Alert;Cooperative;Pleasant mood   HPI TBI   Treatment Provided   Treatment provided Cognitive-Linquistic   Pain Assessment   Pain Assessment No/denies pain   Cognitive-Linquistic Treatment   Treatment focused on Cognition   Skilled Treatment REASONING: Logic puzzle: requires min-mod cues to solve Perplexor Level A puzzle.  READING COMPREHENSION:  Read mystery story (Grade 6-8) and answer factual questions with 70% accuracy; improves with cues to literally locate answers in text vs. relying on memory.  Solved mystery given min cues.  MEMORY: Recalled fact from last session.  Requires fewer cues to locate answers in test.  Is recalling "rules" for solving Perplexor puzzles with fewer cues from SLP.   LANGUAGE: Generate cogent and grammatical sentences for simple linguistic task with 75%  accuracy.   Assessment / Recommendations / Plan   Plan Continue with current plan of care   Progression Toward Goals   Progression toward goals Progressing toward goals          SLP Education - 09/11/15 0819    Education provided Yes   Education Details Cognitive strategies to increase independence   Person(s) Educated Patient   Methods Explanation   Comprehension Verbalized understanding            SLP Long Term Goals - 08/06/15 1554    SLP LONG TERM GOAL #1   Title Patient will demonstrate functional cognitive-communication skills for independent completion of personal responsibilities.   Status Partially Met   SLP LONG TERM GOAL #2   Title Patient will complete complex executive function skills tasks with 80% accuracy.   Status Partially Met   SLP LONG TERM GOAL #3   Title Patient will complete complex attention tasks with 80% accuracy.   Status Achieved   SLP LONG TERM GOAL #4   Title Patient will complete memory strategy activities with 80% accuracy.   Status Partially Met   SLP LONG TERM GOAL #5   Title Patient will generate grammatical and cogent sentences to complete abstract/complex linguistic tasks with 80% accuracy   Status Partially Met          Plan - 09/11/15 0820    Clinical Impression Statement The patient  is attending well to simple therapeutic tasks and is exhibiting less frustration with more difficult tasks.  She is having word retrieval problems, abstract thinking problems, and visual perceptual problems.  She enjoys language tasks and is better able to independently identify and problem solve errors.  The patient has identified returning to independent living as her primary goal.  We will continue to determine cognitive barriers to independence and devise therapeutic tasks to increase safe independence.   Speech Therapy Frequency 2x / week   Duration Other (comment)   Treatment/Interventions Compensatory techniques;Cognitive  reorganization;Internal/external aids;Functional tasks;SLP instruction and feedback;Compensatory strategies;Patient/family education   Potential to Achieve Goals Good   Potential Considerations Ability to learn/carryover information;Cooperation/participation level;Previous level of function;Severity of impairments;Family/community support   SLP Home Exercise Plan identify barriers for ADLs   Consulted and Agree with Plan of Care Patient      Patient will benefit from skilled therapeutic intervention in order to improve the following deficits and impairments:   Cognitive communication deficit    Problem List There are no active problems to display for this patient.  Natasha Sea, MS/CCC- SLP  Natasha [name] 09/11/2015, 8:21 AM  [place] MAIN [facility] SERVICES [address] [place], [place], [address] Phone: [phone]   Fax:  [phone]   Name: Natasha [name] MRN: [id number] Date of Birth: 09-17-1985

## 2015-09-13 ENCOUNTER — Ambulatory Visit: Payer: Commercial Managed Care - HMO | Admitting: Speech Pathology

## 2015-09-13 ENCOUNTER — Ambulatory Visit: Payer: Commercial Managed Care - HMO | Admitting: Occupational Therapy

## 2015-09-13 ENCOUNTER — Encounter: Payer: Self-pay | Admitting: Physical Therapy

## 2015-09-13 ENCOUNTER — Ambulatory Visit: Payer: Commercial Managed Care - HMO | Admitting: Physical Therapy

## 2015-09-13 ENCOUNTER — Encounter: Payer: Self-pay | Admitting: Speech Pathology

## 2015-09-13 DIAGNOSIS — M6281 Muscle weakness (generalized): Secondary | ICD-10-CM

## 2015-09-13 DIAGNOSIS — R278 Other lack of coordination: Secondary | ICD-10-CM

## 2015-09-13 DIAGNOSIS — R41841 Cognitive communication deficit: Secondary | ICD-10-CM

## 2015-09-13 DIAGNOSIS — R2681 Unsteadiness on feet: Secondary | ICD-10-CM
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
## 2015-09-13 NOTE — Therapy (Signed)
[facility] MAIN [facility] SERVICES [address] [place], [place], [address] Phone: [phone]   Fax:  [phone]  Physical Therapy Treatment  Patient Details  Name: Natasha [name] MRN: [id number] Date of Birth: 02-15-1986 Referring Provider: Doristine Mango  Encounter Date: 09/13/2015      PT End of Session - 09/13/15 1407    Visit Number 38   Number of Visits 49   PT Start Time 0315   PT Stop Time 0400   PT Time Calculation (min) 45 min   Activity Tolerance Patient tolerated treatment well      Past Medical History  Diagnosis Date  . Restless leg syndrome unk  . Polysubstance abuse     Past Surgical History  Procedure Laterality Date  . Cholecystectomy    . Tubal ligation      There were no vitals filed for this visit.      Subjective Assessment - 09/13/15 1405    Subjective Patient is practicing walking in the hall wihtout AD inside.       TREATMENT Therapeutic exercise;  slde stepping on TM left and right with . 4 Miles / hour 4 square fwd/bwd/ side stepping left and right/ diagonals Tapping on steps x 20  Ascending/descending steps without railing    THER-EX Nustep L3 x 5 minutes  Quantum double leg press 75 x 15 x 3 Heel raises with UE support  2 x 10; Mini squats with RTB around knees to prevent valgus 2 x 10; GTB side stepping in // bars 4 lengths x 2;   NEUROMUSCULAR RE-ED Gait in hallway with horizontal head turns 80' x 2; Standing slow marching without UE support 2 x 10; Modified tandem stance eyes open/closed x 30 seconds each alternating LE; Modified tandem stance eyes open with horizontal and vertical head turns alternating                             PT Education - 09/13/15 1406    Education provided Yes   Education Details HEP progression   Person(s) Educated Patient   Methods Explanation   Comprehension Verbalized understanding             PT Long Term Goals  - 08/08/15 1647    PT LONG TERM GOAL #1   Title Patient will reduce timed up and go to <11 seconds to reduce fall risk and demonstrate improved transfer/gait ability.   Time 12   Period Weeks   Status On-going   PT LONG TERM GOAL #2   Title Patient will increase six minute walk test distance to >1000 for progression to community ambulator and improve gait ability   Time 12   Period Weeks   Status On-going   PT LONG TERM GOAL #3   Title Patient will increase 10 meter walk test to >1.51m/s as to improve gait speed for better community ambulation and to reduce fall    Time 12   Period Weeks   Status On-going   PT LONG TERM GOAL #4   Title Patient will tolerate 5 seconds of single leg stance without loss of balance to improve ability to get in and out of shower safely   Time 12   Period Weeks   Status On-going   PT LONG TERM GOAL #5   Title Patient (< 64 years old) will complete five times sit to stand test in < 10 seconds indicating an increased  LE strength and improved balance   Time 12   Period Weeks   Status On-going               Plan - 09/13/15 1508    Clinical Impression Statement Patient is able to perform ambulation short distances without AD and perform LE exercises.    Rehab Potential Good   PT Frequency 2x / week   PT Duration 12 weeks   PT Treatment/Interventions Therapeutic activities;Therapeutic exercise;Balance training;Neuromuscular re-education;Stair training;Gait training;Patient/family education   PT Next Visit Plan strengthening and balance training; gait training   PT Home Exercise Plan balance in corner   Consulted and Agree with Plan of Care Patient      Patient will benefit from skilled therapeutic intervention in order to improve the following deficits and impairments:  Abnormal gait, Decreased balance, Decreased endurance, Decreased mobility, Difficulty walking, Decreased cognition, Decreased safety awareness, Decreased strength, Impaired UE  functional use, Obesity  Visit Diagnosis: Muscle weakness (generalized)  Unsteadiness on feet     Problem List There are no active problems to display for this patient.  Ezekiel InaKristine S Joenathan Sakuma, PT, DPT [place], [place] 09/13/2015, 3:10 PM  [facility] MAIN [facility] [address] [place], [place], [address] Phone: [phone]   Fax:  [phone]  Name: Natasha [name] MRN: [id number] Date of Birth: 06/20/1985

## 2015-09-13 NOTE — Patient Instructions (Signed)
OT TREATMENT    Neuro muscular re-education:  Pt. worked on [facility] skills grasping 1/8 inch objects with tweezers. Pt. Worked on controlling hand movements when grasping and changing peg position to fit in a small slot/space on the tray. Pt. Presented with limited refined motor control, coordination with the tweezers when placing the pegs close to one another. Pt. required multiple rest breaks.   Selfcare: Reviewed and modified self-care goals with pt.

## 2015-09-13 NOTE — Therapy (Signed)
[place] MAIN [facility] SERVICES [address] [place], [place], [address] Phone: [phone]   Fax:  [phone]  Speech Language Pathology Treatment  Patient Details  Name: ZARIA TAHA MRN: [id number] Date of Birth: 02/07/1986 Referring Provider: Dr. Janene Harvey  Encounter Date: 09/13/2015      End of Session - 09/13/15 1459    Visit Number 48   Number of Visits 53   Date for SLP Re-Evaluation 09/21/15   SLP Start Time 1400   SLP Stop Time  1459   SLP Time Calculation (min) 59 min   Activity Tolerance Patient tolerated treatment well      Past Medical History  Diagnosis Date  . Restless leg syndrome unk  . Polysubstance abuse     Past Surgical History  Procedure Laterality Date  . Cholecystectomy    . Tubal ligation      There were no vitals filed for this visit.      Subjective Assessment - 09/13/15 1458    Subjective The patient feels that she is working harder / better and is remembering better as well.   Currently in Pain? No/denies               ADULT SLP TREATMENT - 09/13/15 0001    General Information   Behavior/Cognition Alert;Cooperative;Pleasant mood   HPI TBI   Treatment Provided   Treatment provided Cognitive-Linquistic   Pain Assessment   Pain Assessment No/denies pain   Cognitive-Linquistic Treatment   Treatment focused on Cognition   Skilled Treatment REASONING: Logic puzzle: requires min-mod cues to solve Perplexor Level A puzzle.  READING COMPREHENSION:  Read mystery story (Grade 6-8) and answer factual questions with 80% accuracy; improves with cues to literally locate answers in text vs. relying on memory.  Solved mystery independently.  MEMORY: Recalled fact from last session.  Requires fewer cues to locate answers in test.  Is recalling "rules" for solving Perplexor puzzles with fewer cues from SLP.   LANGUAGE: Generate cogent and grammatical sentences for moderate linguistic task with 95%  accuracy.   Assessment / Recommendations / Plan   Plan Continue with current plan of care   Progression Toward Goals   Progression toward goals Progressing toward goals          SLP Education - 09/13/15 1458    Education provided Yes   Education Details Cognitive strategies to improve independence   Person(s) Educated Patient   Methods Explanation   Comprehension Verbalized understanding            SLP Long Term Goals - 08/06/15 1554    SLP LONG TERM GOAL #1   Title Patient will demonstrate functional cognitive-communication skills for independent completion of personal responsibilities.   Status Partially Met   SLP LONG TERM GOAL #2   Title Patient will complete complex executive function skills tasks with 80% accuracy.   Status Partially Met   SLP LONG TERM GOAL #3   Title Patient will complete complex attention tasks with 80% accuracy.   Status Achieved   SLP LONG TERM GOAL #4   Title Patient will complete memory strategy activities with 80% accuracy.   Status Partially Met   SLP LONG TERM GOAL #5   Title Patient will generate grammatical and cogent sentences to complete abstract/complex linguistic tasks with 80% accuracy   Status Partially Met          Plan - 09/13/15 1459    Clinical Impression Statement The patient is attending  well to simple therapeutic tasks and is exhibiting less frustration with more difficult tasks.  She is having word retrieval problems, abstract thinking problems, and visual perceptual problems.  She enjoys language tasks and is better able to independently identify and problem solve errors.  The patient has identified returning to independent living as her primary goal.  We will continue to determine cognitive barriers to independence and devise therapeutic tasks to increase safe independence.   Speech Therapy Frequency 2x / week   Duration Other (comment)   Treatment/Interventions Compensatory techniques;Cognitive  reorganization;Internal/external aids;Functional tasks;SLP instruction and feedback;Compensatory strategies;Patient/family education   Potential to Achieve Goals Good   Potential Considerations Ability to learn/carryover information;Cooperation/participation level;Previous level of function;Severity of impairments;Family/community support   SLP Home Exercise Plan identify barriers for ADLs   Consulted and Agree with Plan of Care Patient      Patient will benefit from skilled therapeutic intervention in order to improve the following deficits and impairments:   Cognitive communication deficit    Problem List There are no active problems to display for this patient.  Leroy Sea, MS/CCC- SLP  Lou Miner 09/13/2015, 3:00 PM  [place] MAIN [facility] SERVICES [address] [place], [place], [address] Phone: [phone]   Fax:  [phone]   Name: KEEANA PIERATT MRN: [id number] Date of Birth: 10-02-1985

## 2015-09-18 ENCOUNTER — Encounter: Payer: Self-pay | Admitting: Physical Therapy

## 2015-09-18 ENCOUNTER — Ambulatory Visit: Payer: Commercial Managed Care - HMO | Admitting: Speech Pathology

## 2015-09-18 ENCOUNTER — Ambulatory Visit: Payer: Commercial Managed Care - HMO | Admitting: Occupational Therapy

## 2015-09-18 ENCOUNTER — Ambulatory Visit: Payer: Commercial Managed Care - HMO | Admitting: Physical Therapy

## 2015-09-18 DIAGNOSIS — R41841 Cognitive communication deficit: Secondary | ICD-10-CM

## 2015-09-18 DIAGNOSIS — R278 Other lack of coordination: Secondary | ICD-10-CM

## 2015-09-18 DIAGNOSIS — R2681 Unsteadiness on feet: Secondary | ICD-10-CM

## 2015-09-18 DIAGNOSIS — M6281 Muscle weakness (generalized): Secondary | ICD-10-CM

## 2015-09-18 NOTE — Therapy (Signed)
[facility] [address] [place], [place], [address] Phone: [phone]   Fax:  [phone]  Physical Therapy Treatment  Patient Details  Name: Natasha [name] MRN: [id number] Date of Birth: 08/02/1985 Referring Provider: Doristine MangoWhite elizabeth  Encounter Date: 09/18/2015      PT End of Session - 09/18/15 1424    Visit Number 39   Number of Visits 49   PT Start Time 0200   PT Stop Time 0245   PT Time Calculation (min) 45 min   Activity Tolerance Patient tolerated treatment well      Past Medical History  Diagnosis Date  . Restless leg syndrome unk  . Polysubstance abuse     Past Surgical History  Procedure Laterality Date  . Cholecystectomy    . Tubal ligation      There were no vitals filed for this visit.      Subjective Assessment - 09/18/15 1423    Subjective Patient is practicing walking in the hall wihtout AD inside.    Currently in Pain? No/denies      Therex: Nustep L 3 x 4 min no charge  Standing march 2x10 Fwd/retro walking without UE  x 100 feet Side steps x 3o feet x 3   Four square diagonals/side to side/ fwd/bwd Standing hip abd 2x10 Standing hip ext 2x10 Standing ankle PF/ PF 2x10  Pt requires min verbal and tactile cues for proper exercise performance  Gait training without AD in hall way close to the wall with CGA and 1000 feet x 3 with turning and head turns left and right.  Patient has no loss of balance during ambulation but does have fatigue.                           PT Education - 09/18/15 1423    Education provided Yes   Education Details safety   Person(s) Educated Patient   Methods Explanation   Comprehension Verbalized understanding             PT Long Term Goals - 08/08/15 1647    PT LONG TERM GOAL #1   Title Patient will reduce timed up and go to <11 seconds to reduce fall risk and demonstrate improved transfer/gait ability.   Time 12   Period Weeks   Status On-going   PT LONG TERM GOAL #2   Title Patient will increase six minute walk test distance to >1000 for progression to community ambulator and improve gait ability   Time 12   Period Weeks   Status On-going   PT LONG TERM GOAL #3   Title Patient will increase 10 meter walk test to >1.2278m/s as to improve gait speed for better community ambulation and to reduce fall    Time 12   Period Weeks   Status On-going   PT LONG TERM GOAL #4   Title Patient will tolerate 5 seconds of single leg stance without loss of balance to improve ability to get in and out of shower safely   Time 12   Period Weeks   Status On-going   PT LONG TERM GOAL #5   Title Patient (< 143 years old) will complete five times sit to stand test in < 10 seconds indicating an increased LE strength and improved balance   Time 12   Period Weeks   Status On-going  Plan - 09/18/15 1425    Clinical Impression Statement Decreased coordination demonstrated requiring consistent verbal cueing to correct form.   Rehab Potential Good   PT Frequency 2x / week   PT Duration 12 weeks   PT Treatment/Interventions Therapeutic activities;Therapeutic exercise;Balance training;Neuromuscular re-education;Stair training;Gait training;Patient/family education   PT Next Visit Plan strengthening and balance training; gait training   PT Home Exercise Plan balance in corner   Consulted and Agree with Plan of Care Patient      Patient will benefit from skilled therapeutic intervention in order to improve the following deficits and impairments:  Abnormal gait, Decreased balance, Decreased endurance, Decreased mobility, Difficulty walking, Decreased cognition, Decreased safety awareness, Decreased strength, Impaired UE functional use, Obesity  Visit Diagnosis: Muscle weakness (generalized)  Unsteadiness on feet     Problem List There are no active problems to display for this patient.  [name], PT, DPT [place], [place] 09/18/2015, 2:27 PM  [place] [facility] MAIN [name] SERVICES [address] [place], [place], [address] Phone: [phone]   Fax:  [phone]  Name: Natasha [name] MRN: [id number] Date of Birth: [birth year]
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
## 2015-09-18 NOTE — Patient Instructions (Signed)
OT TREATMENT     Neuro muscular re-education:   Pt. worked on Arboriculturistcalculating/subtracting dollar amounts with coins. Pt. Worked on grasping the coins, and storing them in her palm. Pt. Also worked on moving the coins from her palm to the tip of 2nd digit and thumb to place it through the container. Pt. Worked on grasping flat cards after calculating number amounts on the cards.  Therapeutic Exercise:   Pt. performed gross gripping with grip strengthener. Pt. worked on sustaining grip while grasping pegs and reaching at various heights.

## 2015-09-19 ENCOUNTER — Encounter: Payer: Self-pay | Admitting: Speech Pathology

## 2015-09-19 NOTE — Therapy (Signed)
[place] MAIN [facility] SERVICES [address] [place], [place], [address] Phone: [phone]   Fax:  [phone]  Speech Language Pathology Treatment  Patient Details  Name: NIKOLINA SIMERSON MRN: [id number] Date of Birth: [birth year] Referring Provider: Dr. Janene Harvey  Encounter Date: 09/18/2015      End of Session - 09/19/15 0819    Visit Number 49   Number of Visits 49   Date for SLP Re-Evaluation 09/21/15   SLP Start Time 1500   SLP Stop Time  1600   SLP Time Calculation (min) 60 min   Activity Tolerance Patient tolerated treatment well      Past Medical History  Diagnosis Date  . Restless leg syndrome unk  . Polysubstance abuse     Past Surgical History  Procedure Laterality Date  . Cholecystectomy    . Tubal ligation      There were no vitals filed for this visit.      Subjective Assessment - 09/19/15 0818    Subjective The patient feels that she is working harder / better and is remembering better as well.   Currently in Pain? No/denies               ADULT SLP TREATMENT - 09/19/15 0001    General Information   Behavior/Cognition Alert;Cooperative;Pleasant mood   HPI TBI   Treatment Provided   Treatment provided Cognitive-Linquistic   Pain Assessment   Pain Assessment No/denies pain   Cognitive-Linquistic Treatment   Treatment focused on Cognition   Skilled Treatment REASONING: Logic puzzle: requires min-mod cues to solve Perplexor Level A puzzle.  READING COMPREHENSION:  Read mystery story (Grade 6-8) and answer factual questions with 80% accuracy; improves with cues to literally locate answers in text vs. relying on memory.  Solved mystery given min cues.  MEMORY: Recalled fact from last session.  Requires fewer cues to locate answers in test.  Is recalling "rules" for solving Perplexor puzzles with fewer cues from SLP.   LANGUAGE: Generate synonyms with 75% accuracy.   Assessment / Recommendations / Plan   Plan Continue with current plan of care   Progression Toward Goals   Progression toward goals Progressing toward goals          SLP Education - 09/19/15 0818    Education provided Yes   Education Details Cognitive strategies to improve independence   Person(s) Educated Patient   Methods Explanation   Comprehension Verbalized understanding            SLP Long Term Goals - 08/06/15 1554    SLP LONG TERM GOAL #1   Title Patient will demonstrate functional cognitive-communication skills for independent completion of personal responsibilities.   Status Partially Met   SLP LONG TERM GOAL #2   Title Patient will complete complex executive function skills tasks with 80% accuracy.   Status Partially Met   SLP LONG TERM GOAL #3   Title Patient will complete complex attention tasks with 80% accuracy.   Status Achieved   SLP LONG TERM GOAL #4   Title Patient will complete memory strategy activities with 80% accuracy.   Status Partially Met   SLP LONG TERM GOAL #5   Title Patient will generate grammatical and cogent sentences to complete abstract/complex linguistic tasks with 80% accuracy   Status Partially Met          Plan - 09/19/15 0819    Clinical Impression Statement The patient is attending well to simple therapeutic tasks and  is exhibiting less frustration with more difficult tasks.  She is having word retrieval problems, abstract thinking problems, and visual perceptual problems.  She enjoys language tasks and is better able to independently identify and problem solve errors.  The patient has identified returning to independent living as her primary goal.  We will continue to determine cognitive barriers to independence and devise therapeutic tasks to increase safe independence.   Speech Therapy Frequency 2x / week   Duration Other (comment)   Treatment/Interventions Compensatory techniques;Cognitive reorganization;Internal/external aids;Functional tasks;SLP instruction and  feedback;Compensatory strategies;Patient/family education   Potential to Achieve Goals Good   Potential Considerations Ability to learn/carryover information;Cooperation/participation level;Previous level of function;Severity of impairments;Family/community support   SLP Home Exercise Plan identify barriers for ADLs   Consulted and Agree with Plan of Care Patient      Patient will benefit from skilled therapeutic intervention in order to improve the following deficits and impairments:   Cognitive communication deficit    Problem List There are no active problems to display for this patient.  Leroy Sea, MS/CCC- SLP  Lou Miner 09/19/2015, 8:21 AM  [place] MAIN [facility] SERVICES [address] [place], [place], [address] Phone: [phone]   Fax:  [phone]   Name: RAGINA FENTER MRN: [id number] Date of Birth: 16-Mar-1986

## 2015-09-20 ENCOUNTER — Ambulatory Visit: Payer: Commercial Managed Care - HMO | Admitting: Occupational Therapy

## 2015-09-20 ENCOUNTER — Encounter: Payer: Self-pay | Admitting: Speech Pathology

## 2015-09-20 ENCOUNTER — Encounter: Payer: Self-pay | Admitting: Physical Therapy

## 2015-09-20 ENCOUNTER — Ambulatory Visit: Payer: Commercial Managed Care - HMO | Admitting: Speech Pathology

## 2015-09-20 ENCOUNTER — Ambulatory Visit: Payer: Commercial Managed Care - HMO | Admitting: Physical Therapy

## 2015-09-20 DIAGNOSIS — M6281 Muscle weakness (generalized): Secondary | ICD-10-CM

## 2015-09-20 DIAGNOSIS — R41841 Cognitive communication deficit: Secondary | ICD-10-CM

## 2015-09-20 DIAGNOSIS — R278 Other lack of coordination: Secondary | ICD-10-CM

## 2015-09-20 DIAGNOSIS — R2681 Unsteadiness on feet: Secondary | ICD-10-CM
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
## 2015-09-20 NOTE — Patient Instructions (Addendum)
OT TREATMENT    Neuro muscular re-education:  Pt. worked on grasping mini clips with thumb and 2nd digit and fastening them on a vertical angle. Pt. worked on removing them with thumb opposition to the 2nd through 5th digits. Pt. worked on tasks to sustain lateral pinch on resistive tweezers while grasping and moving 2" toothpick sticks from a horizontal flat position to a vertical position in order to place it in the holder. Pt. was able to sustain grasp while positioning and extending the wrist/hand in the necessary alignment needed to place the stick through the top of the holder. Pt. Required verbal cues for position of wrist.  Selfcare:  Pt. worked on filling out a Interior and spatial designercheck register, and calculating/subtracting amounts on the register. Pt. Required cues for aligning the numbers, and decimal points. Cues required for the process of subtracting.

## 2015-09-20 NOTE — Therapy (Signed)
[place] [facility] SERVICES [address] [place], [place], [address] Phone: [phone]   Fax:  [phone]  Physical Therapy Treatment  Patient Details  Name: Hector ShadeWhitney B Forgy MRN: [id number] Date of Birth: 11/07/1985 Referring Provider: Doristine MangoWhite elizabeth  Encounter Date: 09/20/2015      PT End of Session - 09/20/15 1409    Visit Number 40   Number of Visits 49   PT Start Time 0200   PT Stop Time 0245   PT Time Calculation (min) 45 min   Activity Tolerance Patient tolerated treatment well      Past Medical History  Diagnosis Date  . Restless leg syndrome unk  . Polysubstance abuse     Past Surgical History  Procedure Laterality Date  . Cholecystectomy    . Tubal ligation      There were no vitals filed for this visit.      Subjective Assessment - 09/20/15 1409    Subjective Patient is practicing walking in the hall wihtout AD inside.    Currently in Pain? No/denies      TM walking 1.0 x 10 mins  Therapeutic exercise including ; Leg press x 20 x 3 sets double leg press 90 lbs, heel raises 90 lbs x 20 x 3 RTB hip 4 way x 15 x 3 BLE Step ups x 20 x 2 Eccentric step downs x 20 x 2 Walking on 2 x 4 x 5 lengths of the parallel bars Patient needs occasional verbal cueing to improve posture and cueing to correctly perform exercises slowly, holding at end of range to increase motor firing of desired muscle to encourage fatigue.                            PT Education - 09/20/15 1409    Education provided Yes   Education Details HEP   Person(s) Educated Patient   Methods Explanation   Comprehension Verbalized understanding             PT Long Term Goals - 08/08/15 1647    PT LONG TERM GOAL #1   Title Patient will reduce timed up and go to <11 seconds to reduce fall risk and demonstrate improved transfer/gait ability.   Time 12   Period Weeks   Status On-going   PT LONG TERM GOAL #2   Title Patient will increase six minute walk test distance to >1000 for progression to community ambulator and improve gait ability   Time 12   Period Weeks   Status On-going   PT LONG TERM GOAL #3   Title Patient will increase 10 meter walk test to >1.4140m/s as to improve gait speed for better community ambulation and to reduce fall    Time 12   Period Weeks   Status On-going   PT LONG TERM GOAL #4   Title Patient will tolerate 5 seconds of single leg stance without loss of balance to improve ability to get in and out of shower safely   Time 12   Period Weeks   Status On-going   PT LONG TERM GOAL #5   Title Patient (< 30 years old) will complete five times sit to stand test in < 10 seconds indicating an increased LE strength and improved balance   Time 12   Period Weeks   Status On-going               Plan - 09/20/15  1410    Clinical Impression Statement Continues to have balance deficits typical with diagnosis. Patient performs intermediate level exercises without pain behaviors and needs verbal cuing for postural alignment and head positioning   Rehab Potential Good   PT Frequency 2x / week   PT Duration 12 weeks   PT Treatment/Interventions Therapeutic activities;Therapeutic exercise;Balance training;Neuromuscular re-education;Stair training;Gait training;Patient/family education   PT Next Visit Plan strengthening and balance training; gait training   PT Home Exercise Plan balance in corner   Consulted and Agree with Plan of Care Patient      Patient will benefit from skilled therapeutic intervention in order to improve the following deficits and impairments:  Abnormal gait, Decreased balance, Decreased endurance, Decreased mobility, Difficulty walking, Decreased cognition, Decreased safety awareness, Decreased strength, Impaired UE functional use, Obesity  Visit Diagnosis: Muscle weakness (generalized)  Unsteadiness on feet     Problem List There are no active  problems to display for this patient. [name], PT, DPT  [place] 09/20/2015, 2:11 PM  [place] [facility] MAIN [facility] SERVICES [address] [place], [place], [address] Phone: [phone]   Fax:  [phone]  Name: EMELYNN RANCE MRN: [id number] Date of Birth: [birth year]

## 2015-09-20 NOTE — Therapy (Signed)
[place] MAIN [facility] SERVICES [address] [place], [place], [address] Phone: [phone]   Fax:  [phone]  Speech Language Pathology Treatment  Patient Details  Name: Natasha [name] MRN: [id number] Date of Birth: 06/03/1985 Referring Provider: Dr. Janene Harvey  Encounter Date: 09/20/2015      End of Session - 09/20/15 1431    Visit Number 50   Number of Visits 74   Date for SLP Re-Evaluation 11/20/15   SLP Start Time 80   SLP Stop Time  1400   SLP Time Calculation (min) 60 min   Activity Tolerance Patient tolerated treatment well      Past Medical History  Diagnosis Date  . Restless leg syndrome unk  . Polysubstance abuse     Past Surgical History  Procedure Laterality Date  . Cholecystectomy    . Tubal ligation      There were no vitals filed for this visit.      Subjective Assessment - 09/20/15 1430    Subjective The patient feels that she is working harder / better and is remembering better as well.   Currently in Pain? No/denies               ADULT SLP TREATMENT - 09/20/15 0001    General Information   Behavior/Cognition Alert;Cooperative;Pleasant mood   HPI TBI   Treatment Provided   Treatment provided Cognitive-Linquistic   Pain Assessment   Pain Assessment No/denies pain   Cognitive-Linquistic Treatment   Treatment focused on Cognition   Skilled Treatment REASONING: Logic puzzle: requires min cues to solve Perplexor Level A puzzle.  READING COMPREHENSION:  Read mystery story (Grade 6-8) and answer factual questions with 80% accuracy; improves with cues to literally locate answers in text vs. relying on memory.  Solved mystery given min cues.  MEMORY: Recalled fact from last session.  Requires fewer cues to locate answers in test.  Is recalling "rules" for solving Perplexor puzzles with fewer cues from SLP.   LANGUAGE: Generate antonyms with 80% accuracy.   Assessment / Recommendations / Plan   Plan  Continue with current plan of care   Progression Toward Goals   Progression toward goals Progressing toward goals          SLP Education - 09/20/15 1430    Education provided Yes   Education Details Cognitive strategies to enhance independence   Person(s) Educated Patient   Methods Explanation   Comprehension Verbalized understanding            SLP Long Term Goals - 09/20/15 1433    SLP LONG TERM GOAL #1   Title Patient will demonstrate functional cognitive-communication skills for independent completion of personal responsibilities.   Time 8   Period Weeks   Status Partially Met   SLP LONG TERM GOAL #4   Title Patient will complete memory strategy activities with 80% accuracy.   Time 8   Period Weeks   Status Partially Met   SLP LONG TERM GOAL #5   Title Patient will generate grammatical and cogent sentences to complete abstract/complex linguistic tasks with 80% accuracy   Time 8   Period Weeks   Status Partially Met          Plan - 09/20/15 1436    Clinical Impression Statement The patient is attending well to simple therapeutic tasks and is exhibiting less frustration with more difficult tasks.  She is having word retrieval problems, abstract thinking problems, and visual perceptual problems.  She  enjoys language tasks and is better able to independently identify and problem solve errors.  The patient has identified returning to independent living as her primary goal.  We will continue to determine cognitive barriers to independence and devise therapeutic tasks to increase safe independence.      Patient will benefit from skilled therapeutic intervention in order to improve the following deficits and impairments:   Cognitive communication deficit - Plan: SLP plan of care cert/re-cert    Problem List There are no active problems to display for this patient.  Leroy Sea, MS/CCC- SLP  Lou Miner 09/20/2015, 2:39 PM  [place] MAIN [facility] SERVICES [address] [place], [place], [address] Phone: [phone]   Fax:  [phone]   Name: HUNTLEIGH DOOLEN MRN: [id number] Date of Birth: [birth year]

## 2015-09-25 ENCOUNTER — Ambulatory Visit: Payer: Commercial Managed Care - HMO | Admitting: Speech Pathology

## 2015-09-25 ENCOUNTER — Encounter: Payer: Self-pay | Admitting: Physical Therapy

## 2015-09-25 ENCOUNTER — Ambulatory Visit: Payer: Commercial Managed Care - HMO | Admitting: Physical Therapy

## 2015-09-25 ENCOUNTER — Ambulatory Visit: Payer: Commercial Managed Care - HMO | Admitting: Occupational Therapy

## 2015-09-25 ENCOUNTER — Encounter: Payer: Self-pay | Admitting: Speech Pathology

## 2015-09-25 DIAGNOSIS — M6281 Muscle weakness (generalized): Secondary | ICD-10-CM

## 2015-09-25 DIAGNOSIS — R278 Other lack of coordination: Secondary | ICD-10-CM

## 2015-09-25 DIAGNOSIS — R41841 Cognitive communication deficit: Secondary | ICD-10-CM | POA: Diagnosis not present

## 2015-09-25 DIAGNOSIS — R2681 Unsteadiness on feet: Secondary | ICD-10-CM

## 2015-09-25 NOTE — Therapy (Signed)
[facility] MAIN REHAB SERVICES [address] [place], [place], [address] Phone: [phone]   Fax:  [phone]  Speech Language Pathology Treatment  Patient Details  Name: Natasha [name] MRN: [id number] Date of Birth: 11/02/1985 Referring Provider: Dr. Behling  Encounter Date: 09/25/2015      End of Session - 09/25/15 1712    Visit Number 51   Number of Visits 65   Date for SLP Re-Evaluation 11/20/15   SLP Start Time 1300   SLP Stop Time  1400   SLP Time Calculation (min) 60 min   Activity Tolerance Patient tolerated treatment well      Past Medical History  Diagnosis Date  . Restless leg syndrome unk  . Polysubstance abuse     Past Surgical History  Procedure Laterality Date  . Cholecystectomy    . Tubal ligation      There were no vitals filed for this visit.      Subjective Assessment - 09/25/15 1712    Subjective The patient feels that she is working harder / better and is remembering better as well.   Currently in Pain? No/denies               ADULT SLP TREATMENT - 09/25/15 0001    General Information   Behavior/Cognition Alert;Cooperative;Pleasant mood   HPI TBI   Treatment Provided   Treatment provided Cognitive-Linquistic   Pain Assessment   Pain Assessment No/denies pain   Cognitive-Linquistic Treatment   Treatment focused on Cognition   Skilled Treatment REASONING: Logic puzzle: requires min cues to solve Perplexor Level A puzzle.  READING COMPREHENSION:  Read mystery story (Grade 6-8) and answer factual questions with 80% accuracy; improves with cues to literally locate answers in text vs. relying on memory.  Solved mystery given no cues.  MEMORY: Recalled fact from last session.  Requires fewer cues to locate answers in text.  Is recalling "rules" for solving Perplexor puzzles with fewer cues from SLP.   LANGUAGE: Given picture stimulus, generate 5 nouns and 5 verbs with minimal difficulty.  Patient  demonstrates significant difficulty generating a list of adjectives.   Assessment / Recommendations / Plan   Plan Continue with current plan of care   Progression Toward Goals   Progression toward goals Progressing toward goals          SLP Education - 09/25/15 1712    Education provided Yes   Education Details language and cognitive strategies   Person(s) Educated Patient   Methods Explanation   Comprehension Verbalized understanding            SLP Long Term Goals - 09/20/15 1433    SLP LONG TERM GOAL #1   Title Patient will demonstrate functional cognitive-communication skills for independent completion of personal responsibilities.   Time 8   Period Weeks   Status Partially Met   SLP LONG TERM GOAL #4   Title Patient will complete memory strategy activities with 80% accuracy.   Time 8   Period Weeks   Status Partially Met   SLP LONG TERM GOAL #5   Title Patient will generate grammatical and cogent sentences to complete abstract/complex linguistic tasks with 80% accuracy   Time 8   Period Weeks   Status Partially Met          Plan - 09/25/15 1713    Clinical Impression Statement The patient is attending well to simple therapeutic tasks and is exhibiting less frustration with more difficult tasks.    She is having word retrieval problems, abstract thinking problems, and visual perceptual problems.  She enjoys language tasks and is better able to independently identify and problem solve errors.  The patient has identified returning to independent living as her primary goal.  We will continue to determine cognitive barriers to independence and devise therapeutic tasks to increase safe independence.   Speech Therapy Frequency 2x / week   Duration Other (comment)   Treatment/Interventions Compensatory techniques;Cognitive reorganization;Internal/external aids;Functional tasks;SLP instruction and feedback;Compensatory strategies;Patient/family education   Potential to  Achieve Goals Good   Potential Considerations Ability to learn/carryover information;Cooperation/participation level;Previous level of function;Severity of impairments;Family/community support   SLP Home Exercise Plan identify barriers for ADLs and independence   Consulted and Agree with Plan of Care Patient      Patient will benefit from skilled therapeutic intervention in order to improve the following deficits and impairments:   Cognitive communication deficit    Problem List There are no active problems to display for this patient.  Leroy Sea, MS/CCC- SLP  Lou Miner 09/25/2015, 5:14 PM  [place] MAIN [facility] SERVICES [address] [place], [place], [address] Phone: [phone]   Fax:  [phone]   Name: Natasha [name] MRN: [id number] Date of Birth: 1985-07-03
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
## 2015-09-25 NOTE — Patient Instructions (Addendum)
OT TREATMENT    Neuro muscular re-education:   Pt. worked on bilateral hand coordination building PCP towers. Pt. worked on multiple towers of varying degrees of complexity. Pt. Required cues to follow the complex designs, and cues for corrections. Pt. worked with 2# cuff weights on for constructing the last tower for strengthening.

## 2015-09-25 NOTE — Therapy (Signed)
[place] [facility] MAIN [facility] SERVICES [address] [place], [place], [address] Phone: [phone]   Fax:  [phone]  Physical Therapy Treatment  Patient Details  Name: Natasha [name] MRN: [id number] Date of Birth: Aug 29, 1985 Referring Provider: Doristine Mango  Encounter Date: 09/25/2015      PT End of Session - 09/25/15 1422    Visit Number 41   Number of Visits 49   PT Start Time 0205   PT Stop Time 0245   PT Time Calculation (min) 40 min   Equipment Utilized During Treatment Gait belt   Activity Tolerance Patient tolerated treatment well   Behavior During Therapy [facility] for tasks assessed/performed      Past Medical History  Diagnosis Date  . Restless leg syndrome unk  . Polysubstance abuse     Past Surgical History  Procedure Laterality Date  . Cholecystectomy    . Tubal ligation      There were no vitals filed for this visit.      Subjective Assessment - 09/25/15 1421    Subjective Patient is practicing walking in the hall wihtout AD inside.       THER-EX Standing exercises with 2# ankle weights: Marching 2 x 10; SLR 2 x 10; Abduction 2 x 10; Extension 2 x 10; Knee flexion 2 x 10; Heel raises 2 x 10;  Resisted side-steeping RTB 4 lengths x 2; Standing mini squats 2 x 10 with RTB around knees to encourage abduction; Sit to stand without UE support 2 x 10; Step-ups to 6" step x 10 bilateral; Quantum leg press 105# x 10, 120# x 10;  Gait training without loft strand crutches and CGA for 1000 feet x 3 TM walking 1.2 miles / hour and elevation 3 x 5 minutes Patient has fatigue today and no reports of pain. Patient continues to demonstrates  incoordination of movement with select exercises  Patient responds well to verbal and tactile cues to correct form and technique. Motor control of LE is  improved.  Muscle fatigue but no major pain complaints.                           PT Education - 09/25/15  1422    Education provided Yes   Education Details safety with ambulation using a wall for balance during walking without AD   Person(s) Educated Patient   Methods Explanation   Comprehension Verbalized understanding             PT Long Term Goals - 08/08/15 1647    PT LONG TERM GOAL #1   Title Patient will reduce timed up and go to <11 seconds to reduce fall risk and demonstrate improved transfer/gait ability.   Time 12   Period Weeks   Status On-going   PT LONG TERM GOAL #2   Title Patient will increase six minute walk test distance to >1000 for progression to community ambulator and improve gait ability   Time 12   Period Weeks   Status On-going   PT LONG TERM GOAL #3   Title Patient will increase 10 meter walk test to >1.63m/s as to improve gait speed for better community ambulation and to reduce fall    Time 12   Period Weeks   Status On-going   PT LONG TERM GOAL #4   Title Patient will tolerate 5 seconds of single leg stance without loss of balance to improve ability to get in  and out of shower safely   Time 12   Period Weeks   Status On-going   PT LONG TERM GOAL #5   Title Patient (< 30 years old) will complete five times sit to stand test in < 10 seconds indicating an increased LE strength and improved balance   Time 12   Period Weeks   Status On-going               Plan - 09/25/15 1423    Clinical Impression Statement Patient has decreased BLE strength and decreased dynamic standing balance and is able to perform supine, sidelying and seated LE exercises wihtout pain behaviors.    Rehab Potential Good   PT Frequency 2x / week   PT Duration 12 weeks   PT Treatment/Interventions Therapeutic activities;Therapeutic exercise;Balance training;Neuromuscular re-education;Stair training;Gait training;Patient/family education   PT Next Visit Plan strengthening and balance training; gait training   PT Home Exercise Plan balance in corner   Consulted and Agree  with Plan of Care Patient      Patient will benefit from skilled therapeutic intervention in order to improve the following deficits and impairments:  Abnormal gait, Decreased balance, Decreased endurance, Decreased mobility, Difficulty walking, Decreased cognition, Decreased safety awareness, Decreased strength, Impaired UE functional use, Obesity  Visit Diagnosis: Muscle weakness (generalized)  Unsteadiness on feet     Problem List There are no active problems to display for this patient.  Ezekiel InaKristine S Castiel Lauricella, PT, DPT [place] 09/25/2015, 2:27 PM  [place] [facility] [address] [place], [place], [address] Phone: [phone]   Fax:  [phone]  Name: Natasha [name] MRN: [id number] Date of Birth: 01/25/1986
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
## 2015-09-27 ENCOUNTER — Ambulatory Visit: Payer: Commercial Managed Care - HMO | Admitting: Occupational Therapy

## 2015-09-27 ENCOUNTER — Ambulatory Visit: Payer: Commercial Managed Care - HMO | Admitting: Physical Therapy

## 2015-09-27 ENCOUNTER — Ambulatory Visit: Payer: Commercial Managed Care - HMO | Admitting: Speech Pathology

## 2015-10-02 ENCOUNTER — Ambulatory Visit: Payer: Commercial Managed Care - HMO | Attending: Pediatrics | Admitting: Speech Pathology

## 2015-10-02 ENCOUNTER — Encounter: Payer: Self-pay | Admitting: Speech Pathology

## 2015-10-02 ENCOUNTER — Ambulatory Visit: Payer: Commercial Managed Care - HMO | Admitting: Occupational Therapy

## 2015-10-02 ENCOUNTER — Encounter: Payer: Self-pay | Admitting: Physical Therapy

## 2015-10-02 ENCOUNTER — Ambulatory Visit: Payer: Commercial Managed Care - HMO | Admitting: Physical Therapy

## 2015-10-02 DIAGNOSIS — R41841 Cognitive communication deficit: Secondary | ICD-10-CM | POA: Diagnosis not present

## 2015-10-02 DIAGNOSIS — M6281 Muscle weakness (generalized): Secondary | ICD-10-CM | POA: Insufficient documentation

## 2015-10-02 DIAGNOSIS — R278 Other lack of coordination: Secondary | ICD-10-CM | POA: Diagnosis present

## 2015-10-02 DIAGNOSIS — R2681 Unsteadiness on feet: Secondary | ICD-10-CM | POA: Diagnosis present

## 2015-10-02 DIAGNOSIS — R262 Difficulty in walking, not elsewhere classified: Secondary | ICD-10-CM | POA: Diagnosis present

## 2015-10-02 NOTE — Therapy (Signed)
[facility] MAIN [facility] [address] [place], [place], [address] Phone: [phone]   Fax:  [phone]  Physical Therapy Treatment  Patient Details  Name: Hector ShadeWhitney B Laverdure MRN: [id number] Date of Birth: 07/12/1985 Referring Provider: Doristine MangoWhite elizabeth  Encounter Date: 10/02/2015      PT End of Session - 10/02/15 1416    Visit Number 42   Number of Visits 49   PT Start Time 0200   PT Stop Time 0245   PT Time Calculation (min) 45 min   Equipment Utilized During Treatment Gait belt   Activity Tolerance Patient tolerated treatment well   Behavior During Therapy [facility] for tasks assessed/performed      Past Medical History  Diagnosis Date  . Restless leg syndrome unk  . Polysubstance abuse     Past Surgical History  Procedure Laterality Date  . Cholecystectomy    . Tubal ligation      There were no vitals filed for this visit.      Subjective Assessment - 10/02/15 1415    Subjective Patient is practicing walking in the hall wihtout AD inside.    Currently in Pain? No/denies      THER-EX Standing exercises with RTB BLE : Marching 2 x 10; SLR 2 x 10; Abduction 2 x 10; Extension 2 x 10; Knee flexion 2 x 10; Heel raises 2 x 10; Eccentric step downs x 10 BLE Squats x 10 with 5 sec hold Heel raises x 10 x 2  Resisted side-steeping RTB 4 lengths x 2; Standing mini squats 2 x 10 with RTB around knees to encourage abduction; Sit to stand without UE support 2 x 10; Step-ups to 6" step x 10 bilateral; Quantum leg press 100# x 10, Patient needs occasional verbal cueing to improve posture and cueing to correctly perform exercises slowly, holding at end of range to increase motor firing of desired muscle to encourage fatigue.                            PT Education - 10/02/15 1415    Education provided Yes   Education Details safety with HEP   Person(s) Educated Patient   Methods Explanation   Comprehension Verbalized understanding             PT Long Term Goals - 08/08/15 1647    PT LONG TERM GOAL #1   Title Patient will reduce timed up and go to <11 seconds to reduce fall risk and demonstrate improved transfer/gait ability.   Time 12   Period Weeks   Status On-going   PT LONG TERM GOAL #2   Title Patient will increase six minute walk test distance to >1000 for progression to community ambulator and improve gait ability   Time 12   Period Weeks   Status On-going   PT LONG TERM GOAL #3   Title Patient will increase 10 meter walk test to >1.6231m/s as to improve gait speed for better community ambulation and to reduce fall    Time 12   Period Weeks   Status On-going   PT LONG TERM GOAL #4   Title Patient will tolerate 5 seconds of single leg stance without loss of balance to improve ability to get in and out of shower safely   Time 12   Period Weeks   Status On-going   PT LONG TERM GOAL #5   Title Patient (30 years old)  will complete five times sit to stand test in < 10 seconds indicating an increased LE strength and improved balance   Time 12   Period Weeks   Status On-going               Plan - 10/02/15 1416    Clinical Impression Statement Patient has decreased dynamic standing balance and weakness in her LE's and she was able to perform gait training wiht loftstrand crutches.    Rehab Potential Good   PT Frequency 2x / week   PT Duration 12 weeks   PT Treatment/Interventions Therapeutic activities;Therapeutic exercise;Balance training;Neuromuscular re-education;Stair training;Gait training;Patient/family education   PT Next Visit Plan strengthening and balance training; gait training   PT Home Exercise Plan balance in corner   Consulted and Agree with Plan of Care Patient      Patient will benefit from skilled therapeutic intervention in order to improve the following deficits and impairments:  Abnormal gait, Decreased balance, Decreased  endurance, Decreased mobility, Difficulty walking, Decreased cognition, Decreased safety awareness, Decreased strength, Impaired UE functional use, Obesity  Visit Diagnosis: Muscle weakness (generalized)  Unsteadiness on feet     Problem List There are no active problems to display for this patient.  [name], PT, DPT [place], [place] 10/02/2015, 2:20 PM  [place] [facility] MAIN [facility] SERVICES [address] [place], [place], [address] Phone: [phone]   Fax:  [phone]  Name: CHLOEY RICARD MRN: [id number] Date of Birth: 31-Aug-1985

## 2015-10-02 NOTE — Patient Instructions (Signed)
OT TREATMENT    Neuro muscular re-education:  Pt. worked on creating and copying a design of moderate difficulty. Pt. Worked on worked on grasping 1/4" pegs, and placing them on the board following the design pattern. Pt. Worked on removing pegs while alternating her thumb to the tip of the 2nd through 5th digits. Cues were required for changing colors when following the pattern.  Therapeutic Exercise:   Was provided with theraputty per pt. request secondary to losing her putty. Pt. Was upgraded to green level of resistance. Putty exercises were reviewed with the pt. with verbal cues, visual cues, and visual demonstration.   3# dumbbell ex. for elbow flexion and extension,  2# then upgraded to 3# for forearm supination/pronation, wrist flexion/extension, and radial deviation. Pt. requires rest breaks and verbal cues for proper technique.

## 2015-10-02 NOTE — Therapy (Signed)
[place] MAIN [facility] SERVICES [address] [place], [place], [address] Phone: [phone]   Fax:  [phone]  Speech Language Pathology Treatment  Patient Details  Name: BRINLY MAIETTA MRN: [id number] Date of Birth: April 28, 1986 Referring Provider: Dr. Janene Harvey  Encounter Date: 10/02/2015      End of Session - 10/02/15 1659    Visit Number 52   Number of Visits 21   Date for SLP Re-Evaluation 11/20/15   SLP Start Time 54   SLP Stop Time  1400   SLP Time Calculation (min) 60 min   Activity Tolerance Patient tolerated treatment well      Past Medical History  Diagnosis Date  . Restless leg syndrome unk  . Polysubstance abuse     Past Surgical History  Procedure Laterality Date  . Cholecystectomy    . Tubal ligation      There were no vitals filed for this visit.      Subjective Assessment - 10/02/15 1658    Subjective Ms. Vanhecke was alert and engaged throughout the session. She reports that she thinks her speech is getting better and that she is functioning well at home.   Currently in Pain? No/denies               ADULT SLP TREATMENT - 10/02/15 0001    General Information   Behavior/Cognition Alert;Cooperative;Pleasant mood   HPI TBI   Treatment Provided   Treatment provided Cognitive-Linquistic   Pain Assessment   Pain Assessment No/denies pain   Cognitive-Linquistic Treatment   Treatment focused on Cognition   Skilled Treatment READING COMPREHENSION: Ms. Harries read a mystery story (Grades 6-8) and answered comprehension questions with 100% accuracy with minimal cues from the clinician and reminders to refer to the text. She solved the mystery with no cues. REASONING: Ms. Knapke solved a Perplexor Level A with min/mod cues from the clinician. Use of strategies from earlier sessions (crossing out and circling, taking notes) was beneficial. LANGUAGE: Given picture stimulus, Ms. Tsukamoto generated 3 each of  nouns, verbs, and adjectives for each image with 40% accuracy. This improved to 90% accuracy with moderate clinician cues and examples.   Assessment / Recommendations / Plan   Plan Continue with current plan of care   Progression Toward Goals   Progression toward goals Progressing toward goals          SLP Education - 10/02/15 1659    Education provided Yes   Education Details cognitive strategies   Person(s) Educated Patient   Methods Explanation   Comprehension Verbalized understanding            SLP Long Term Goals - 09/20/15 1433    SLP LONG TERM GOAL #1   Title Patient will demonstrate functional cognitive-communication skills for independent completion of personal responsibilities.   Time 8   Period Weeks   Status Partially Met   SLP LONG TERM GOAL #4   Title Patient will complete memory strategy activities with 80% accuracy.   Time 8   Period Weeks   Status Partially Met   SLP LONG TERM GOAL #5   Title Patient will generate grammatical and cogent sentences to complete abstract/complex linguistic tasks with 80% accuracy   Time 8   Period Weeks   Status Partially Met          Plan - 10/02/15 1659    Clinical Impression Statement Ms. Thalman shows great improvement in her use of strategies to solve logic problems.  She continues to struggle with concepts of language and with memory. Further skilled intervention will be helpful in exploring these difficulties further and working to find solutions.   Speech Therapy Frequency 2x / week   Duration Other (comment)   Treatment/Interventions Compensatory techniques;Cognitive reorganization;Internal/external aids;Functional tasks;SLP instruction and feedback;Compensatory strategies;Patient/family education   Potential to Achieve Goals Good   Potential Considerations Ability to learn/carryover information;Cooperation/participation level;Previous level of function;Severity of impairments;Family/community support   SLP  Home Exercise Plan identify barriers for ADLs and independence   Consulted and Agree with Plan of Care Patient      Patient will benefit from skilled therapeutic intervention in order to improve the following deficits and impairments:   Cognitive communication deficit    Problem List There are no active problems to display for this patient.  Leroy Sea, [place], [place]ie 10/02/2015, 5:00 PM  [place] MAIN [facility] SERVICES [address] [place], [place], [address] Phone: [phone]   Fax:  [phone]   Name: LORELL THIBODAUX MRN: [id number] Date of Birth: [birth year]
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
## 2015-10-04 ENCOUNTER — Ambulatory Visit: Payer: Commercial Managed Care - HMO | Admitting: Speech Pathology

## 2015-10-04 ENCOUNTER — Encounter: Payer: Self-pay | Admitting: Physical Therapy

## 2015-10-04 ENCOUNTER — Ambulatory Visit: Payer: Commercial Managed Care - HMO | Admitting: Physical Therapy

## 2015-10-04 ENCOUNTER — Ambulatory Visit: Payer: Commercial Managed Care - HMO | Admitting: Occupational Therapy

## 2015-10-04 ENCOUNTER — Encounter: Payer: Self-pay | Admitting: Speech Pathology

## 2015-10-04 DIAGNOSIS — R278 Other lack of coordination: Secondary | ICD-10-CM

## 2015-10-04 DIAGNOSIS — R262 Difficulty in walking, not elsewhere classified: Secondary | ICD-10-CM

## 2015-10-04 DIAGNOSIS — R41841 Cognitive communication deficit: Secondary | ICD-10-CM

## 2015-10-04 DIAGNOSIS — M6281 Muscle weakness (generalized): Secondary | ICD-10-CM

## 2015-10-04 NOTE — Therapy (Signed)
[facility] MAIN [facility] SERVICES [address] [place], [place], [address] Phone: [phone]   Fax:  [phone]  Physical Therapy Treatment  Patient Details  Name: Natasha [name] MRN: [id number] Date of Birth: 11/30/1985 Referring Provider: Doristine Mango  Encounter Date: 10/04/2015      PT End of Session - 10/04/15 1413    Visit Number 43   Number of Visits 49   PT Start Time 0200   PT Stop Time 0240   PT Time Calculation (min) 40 min   Equipment Utilized During Treatment Gait belt   Activity Tolerance Patient tolerated treatment well   Behavior During Therapy [facility] for tasks assessed/performed      Past Medical History  Diagnosis Date  . Restless leg syndrome unk  . Polysubstance abuse     Past Surgical History  Procedure Laterality Date  . Cholecystectomy    . Tubal ligation      There were no vitals filed for this visit.      Subjective Assessment - 10/04/15 1412    Subjective Patient is practicing walking in the hall wihtout AD inside. She is feeling more confident on steps.   Currently in Pain? No/denies        Therapeutic exercise: Leg press x 20 x 3 with 90 lbs Squats x 10 x 2 Step ups x 10 x 2 Eccentric step downs x 10 x 2 TM walking 1.2 miles / hour x 10 minutes with elevation of 1 Gait training without loftstrand crutches 1000 feet.with CGA.  Patient needs occasional verbal cueing to improve posture and cueing to correctly perform exercises slowly. She needs rest periods after ambulation                         PT Education - 10/04/15 1412    Education provided Yes   Education Details safety with [employer]) Educated Patient   Methods Explanation   Comprehension Verbalized understanding             PT Long Term Goals - 08/08/15 1647    PT LONG TERM GOAL #1   Title Patient will reduce timed up and go to <11 seconds to reduce fall risk and demonstrate improved  transfer/gait ability.   Time 12   Period Weeks   Status On-going   PT LONG TERM GOAL #2   Title Patient will increase six minute walk test distance to >1000 for progression to community ambulator and improve gait ability   Time 12   Period Weeks   Status On-going   PT LONG TERM GOAL #3   Title Patient will increase 10 meter walk test to >1.60m/s as to improve gait speed for better community ambulation and to reduce fall    Time 12   Period Weeks   Status On-going   PT LONG TERM GOAL #4   Title Patient will tolerate 5 seconds of single leg stance without loss of balance to improve ability to get in and out of shower safely   Time 12   Period Weeks   Status On-going   PT LONG TERM GOAL #5   Title Patient (< 24 years old) will complete five times sit to stand test in < 10 seconds indicating an increased LE strength and improved balance   Time 12   Period Weeks   Status On-going               Plan -  10/04/15 1413    Clinical Impression Statement  Fatigue with sit to stand but demonstrating more control, Increase weight for standing exercises. Fatigue still evident with cross trainer and endurance   Rehab Potential Good   PT Frequency 2x / week   PT Duration 12 weeks   PT Treatment/Interventions Therapeutic activities;Therapeutic exercise;Balance training;Neuromuscular re-education;Stair training;Gait training;Patient/family education   PT Next Visit Plan strengthening and balance training; gait training   PT Home Exercise Plan balance in corner   Consulted and Agree with Plan of Care Patient      Patient will benefit from skilled therapeutic intervention in order to improve the following deficits and impairments:  Abnormal gait, Decreased balance, Decreased endurance, Decreased mobility, Difficulty walking, Decreased cognition, Decreased safety awareness, Decreased strength, Impaired UE functional use, Obesity  Visit Diagnosis: Muscle weakness (generalized)  Difficulty  in walking, not elsewhere classified     Problem List There are no active problems to display for this patient. Ezekiel InaKristine S Tillmon Kisling, PT, DPT  [place], [place] 10/04/2015, 2:50 PM  [place] [facility] [address] [place], [place], [address] Phone: [phone]   Fax:  [phone]  Name: Natasha [name] MRN: [id number] Date of Birth: 12/11/1985

## 2015-10-04 NOTE — Therapy (Signed)
[place] MAIN [facility] SERVICES [place], [place], [address] Phone: [phone]   Fax:  [phone]  Speech Language Pathology Treatment  Patient Details  Name: YSENIA FILICE MRN: [id number] Date of Birth: 1985-12-02 Referring Provider: Dr. Janene Harvey  Encounter Date: 10/04/2015      End of Session - 10/04/15 1424    Visit Number 40   Number of Visits 55   Date for SLP Re-Evaluation 11/20/15   SLP Start Time 37   SLP Stop Time  1400   SLP Time Calculation (min) 60 min   Activity Tolerance Patient tolerated treatment well      Past Medical History  Diagnosis Date  . Restless leg syndrome unk  . Polysubstance abuse     Past Surgical History  Procedure Laterality Date  . Cholecystectomy    . Tubal ligation      There were no vitals filed for this visit.      Subjective Assessment - 10/04/15 1423    Subjective Ms. Newstrom was alert and engaged throughout the session. She says that she still has trouble thinking of what she wants to say, and that she either asks someone else for help or "just waits for it". She expressed that she enjoys coming to [place] and feels she is doing fine.   Currently in Pain? No/denies               ADULT SLP TREATMENT - 10/04/15 0001    General Information   Behavior/Cognition Alert;Cooperative;Pleasant mood   HPI TBI   Treatment Provided   Treatment provided Cognitive-Linquistic   Pain Assessment   Pain Assessment No/denies pain   Cognitive-Linquistic Treatment   Treatment focused on Cognition   Skilled Treatment READING COMPREHENSION: Ms. Cowman read a mystery story (Grades 6-8) and answered comprehension questions with 100% accuracy with minimal cues from the clinician (open ended questions). She solved the mystery with no cues. REASONING: Ms. Baltes solved a Perplexor Level A with minimal cues from the clinician. Prior to beginning the puzzle, she reviewed and wrote down her  strategies and was urged to refer to them before asking the clinician. EXPRESSIVE LANGUAGE: Given picture stimulus, Ms. Kilmer generated 3 each of nouns, verbs, and adjectives for each image with 60% accuracy when provided with a sheet defining the parts of speech. This improved to 90% accuracy with moderate clinician cues and examples. When provided with a category, Ms. Dahir brainstormed an average of 10 items in 1 minute. Her responses were not organized in any of the expected patterns (rainbow order for colors, habitat of animals, etc.).   Assessment / Recommendations / Plan   Plan Continue with current plan of care   Progression Toward Goals   Progression toward goals Progressing toward goals          SLP Education - 10/04/15 1424    Education provided Yes   Education Details Cognitive strategies   Person(s) Educated Patient   Methods Explanation   Comprehension Verbalized understanding            SLP Long Term Goals - 09/20/15 1433    SLP LONG TERM GOAL #1   Title Patient will demonstrate functional cognitive-communication skills for independent completion of personal responsibilities.   Time 8   Period Weeks   Status Partially Met   SLP LONG TERM GOAL #4   Title Patient will complete memory strategy activities with 80% accuracy.   Time 8   Period Weeks  Status Partially Met   SLP LONG TERM GOAL #5   Title Patient will generate grammatical and cogent sentences to complete abstract/complex linguistic tasks with 80% accuracy   Time 8   Period Weeks   Status Partially Met          Plan - 10/04/15 1426    Clinical Impression Statement Ms. Schnurr continues to make progress on using strategies to solve a variety of problems. She still needs frequent reminders to use the resources available to her. She tends to ask the clinician for help before exhausting her other options, so she will benefit from continued ST to gain independence in problem solving and  communication.   Speech Therapy Frequency 2x / week   Duration Other (comment)   Treatment/Interventions Compensatory techniques;Cognitive reorganization;Internal/external aids;Functional tasks;SLP instruction and feedback;Compensatory strategies;Patient/family education   Potential to Achieve Goals Good   Potential Considerations Ability to learn/carryover information;Cooperation/participation level;Previous level of function;Severity of impairments;Family/community support   SLP Home Exercise Plan identify barriers for ADLs and independence   Consulted and Agree with Plan of Care Patient      Patient will benefit from skilled therapeutic intervention in order to improve the following deficits and impairments:   Cognitive communication deficit    Problem List There are no active problems to display for this patient.   Leroy Kennedy 10/04/2015, 2:29 PM  [place] MAIN [facility] SERVICES [address] [place], [place], [address] Phone: [phone]   Fax:  [phone]   Name: WINDSOR ZIRKELBACH MRN: [id number] Date of Birth: 1986/04/27

## 2015-10-05 ENCOUNTER — Encounter: Payer: Self-pay | Admitting: Occupational Therapy

## 2015-10-05 NOTE — Therapy (Signed)
[place] MAIN [facility] SERVICES [address] [place], [place], [address] Phone: [phone]   Fax:  [phone]  Occupational Therapy Treatment  Patient Details  Name: Natasha [name] MRN: [id number] Date of Birth: [birth year] No Data Recorded  Encounter Date: 10/04/2015      OT End of Session - 10/05/15 1615    Visit Number 43   Number of Visits 50   Date for OT Re-Evaluation 10/31/15   OT Start Time 3005   OT Stop Time 1530   OT Time Calculation (min) 43 min   Activity Tolerance Patient tolerated treatment well   Behavior During Therapy [facility] for tasks assessed/performed      Past Medical History  Diagnosis Date  . Restless leg syndrome unk  . Polysubstance abuse     Past Surgical History  Procedure Laterality Date  . Cholecystectomy    . Tubal ligation      There were no vitals filed for this visit.      Subjective Assessment - 10/05/15 1611    Subjective  Patient reports she hasn't been allowed to cook much at her mom's house but she would like to be able to cook so she can eventually live alone.  She reports she has been working towards buying snacks and food each month with her own money and has to budget what she has.  Uses a calculator when she goes to make sure she stays within the budget.    Patient Stated Goals Patient reports she would like to be able to play with her kids, walk, talk, cook and be able to live independently.   Currently in Pain? No/denies   Pain Score 0-No pain                      OT Treatments/Exercises (OP) - 10/05/15 1612    ADLs   ADL Comments Patient seen this date for meal planning with use of a 3 day worksheet for breakfast, lunch, snack and dinner grid along with a grocery list to purchase items.  She was able to complete with minimal cues and missed about 5 items on her grocery list which would be important in the preparation of the meals.    Neurological Re-education  Exercises   Other Exercises 1 Patient seen for grip strengthening tasks with 17# for 25 reps for 2 sets, bilaterally.                OT Education - 10/05/15 1614    Education provided Yes   Education Details Meal planning, issued week long meal planning grid to complete by next session.    Person(s) Educated Patient   Methods Explanation;Demonstration;Verbal cues   Comprehension Verbalized understanding;Returned demonstration;Verbal cues required             OT Long Term Goals - 09/13/15 1343    OT LONG TERM GOAL #1   Title Patient will improve bilateral UE strength by 1 mm grade to complete IADL tasks with modified independence.     Baseline 4/5 bilateral UE at eval   Time 12   Period Weeks   Status Achieved   OT LONG TERM GOAL #2   Title Patient will complete laundry tasks with supervision.   Time 12   Period Weeks   Status Achieved   OT LONG TERM GOAL #3   Title Patient will pour tea from a pitcher without spillage   Time 12   Period Weeks  Status Achieved   OT LONG TERM GOAL #4   Title Patient will demonstrate the ability to transport snack items from the kitchen to the den without spilling items.    Time 12   Period Weeks   Status Achieved   OT LONG TERM GOAL #5   Title Patient will demonstrate the ability to choose her own clothing each day, matching 100% of the time.   Baseline mom has to perform.   Time 12   Period Weeks   Status Achieved   OT LONG TERM GOAL #6   Title Pt. will improve right [place] by 5 sec. to be able to independently open packages and container lids.   Baseline Pt. has difficulty opening snack packets and container lids.   Time 12   Period Weeks   Status Achieved   OT LONG TERM GOAL #7   Title Pt. will independently plan meals and menus for one week in preparation for creating a weekly shopping list.   Baseline Pt. currently has difficulty with meal planning, and creating shopping lists.   Time 12   Period Weeks   Status  Partially Met   OT LONG TERM GOAL #8   Title Pt. will require supervision vacuuming while using compensatory strategies.   Baseline Pt. is unable to vacuuming   Time 12   Period Weeks   Status Achieved   OT LONG TERM GOAL  #9   Baseline Pt. will independently demonstrate organization and placement of items in the refrigerator for easier access.   Time 12   Period Weeks   Status Achieved   OT LONG TERM GOAL  #10   TITLE Pt. will independently calculate, manage, and simulate writing checks for one month of bill management.      Baseline unable    Time 12   Period Weeks   Status Partially Met   OT LONG TERM GOAL  #11   TITLE Pt. will independently be able to put jewelry including fastening clasps.   Baseline difficulty   Time 12   Period Weeks   Status Partially Met   OT LONG TERM GOAL  #12   TITLE Pt. will prepare and cook a complex, multiple step meal with supervision.    Baseline Pt. is able to cook a simple one step meal with S-CGA.   Time 12   Period Weeks   Status On-going               Plan - 10/05/15 1615    Clinical Impression Statement Patient requires minimal cues for meal planning and formulation of grocery list for 3 days, she needs to complete homework of 7 day meal planning and bring in next session.  It is uncertain at this time whether patient will remain at home with her mom or if other options will be explored.  She may require additional focus on specific tasks if she eventually is allowed to live alone.    Rehab Potential Good   OT Frequency 2x / week   OT Duration 12 weeks   OT Treatment/Interventions Self-care/ADL training;Therapeutic exercise;Moist Heat;Neuromuscular education;DME and/or AE instruction;Therapeutic activities;Patient/family education;Cognitive remediation/compensation   Consulted and Agree with Plan of Care Patient      Patient will benefit from skilled therapeutic intervention in order to improve the following deficits and  impairments:  Decreased cognition, Decreased knowledge of use of DME, Decreased coordination, Decreased mobility, Decreased endurance, Decreased strength, Decreased balance, Decreased safety awareness, Decreased knowledge of precautions, Impaired UE functional  use  Visit Diagnosis: Muscle weakness (generalized)  Cognitive communication deficit  Other lack of coordination    Problem List There are no active problems to display for this patient.  Achilles Dunk, OTR/L, CLT  Lovett,Amy 10/05/2015, 4:18 PM  [place] MAIN [facility] SERVICES [address] [place], [place], [address] Phone: [phone]   Fax:  [phone]  Name: Natasha [name] MRN: [id number] Date of Birth: 1986/04/08

## 2015-10-08 ENCOUNTER — Encounter: Payer: Self-pay | Admitting: Physical Therapy

## 2015-10-08 ENCOUNTER — Ambulatory Visit: Payer: Commercial Managed Care - HMO | Admitting: Speech Pathology

## 2015-10-08 ENCOUNTER — Ambulatory Visit: Payer: Commercial Managed Care - HMO | Admitting: Physical Therapy

## 2015-10-08 ENCOUNTER — Encounter: Payer: Self-pay | Admitting: Speech Pathology

## 2015-10-08 ENCOUNTER — Ambulatory Visit: Payer: Commercial Managed Care - HMO | Admitting: Occupational Therapy

## 2015-10-08 DIAGNOSIS — R41841 Cognitive communication deficit: Secondary | ICD-10-CM | POA: Diagnosis not present

## 2015-10-08 DIAGNOSIS — R262 Difficulty in walking, not elsewhere classified: Secondary | ICD-10-CM

## 2015-10-08 DIAGNOSIS — M6281 Muscle weakness (generalized): Secondary | ICD-10-CM

## 2015-10-08 DIAGNOSIS — R278 Other lack of coordination: Secondary | ICD-10-CM

## 2015-10-08 DIAGNOSIS — R2681 Unsteadiness on feet: Secondary | ICD-10-CM

## 2015-10-08 NOTE — Therapy (Signed)
[place] MAIN [facility] SERVICES [address] [place], [place], [address] Phone: [phone]   Fax:  [phone]  Speech Language Pathology Treatment  Patient Details  Name: Natasha [name] MRN: [id number] Date of Birth: [birth year] Referring Provider: Dr. Janene Harvey  Encounter Date: 10/08/2015      End of Session - 10/08/15 1714    Visit Number 59   Number of Visits 31   Date for SLP Re-Evaluation 11/20/15   SLP Start Time 18   SLP Stop Time  1500   SLP Time Calculation (min) 60 min   Activity Tolerance Patient tolerated treatment well      Past Medical History  Diagnosis Date  . Restless leg syndrome unk  . Polysubstance abuse     Past Surgical History  Procedure Laterality Date  . Cholecystectomy    . Tubal ligation      There were no vitals filed for this visit.      Subjective Assessment - 10/08/15 1714    Subjective Natasha [name] was alert and engaged throughout the session. She reports that she can't remember events that occurred long ago such as her wedding but feels that she is doing better remembering new things and recurring events such as her children's birthdays. She states that she feels she is getting better.   Currently in Pain? No/denies               ADULT SLP TREATMENT - 10/08/15 0001    General Information   Behavior/Cognition Alert;Cooperative;Pleasant mood   HPI TBI   Treatment Provided   Treatment provided Cognitive-Linquistic   Pain Assessment   Pain Assessment No/denies pain   Cognitive-Linquistic Treatment   Treatment focused on Cognition   Skilled Treatment READING COMPREHENSION Natasha [name] read a mystery story (grade 6-8) and then answered comprehension questions with 100% accuracy with minimal cues from the clinician mainly consisting of open ended questions. REASONING She solved a Perplexor Level A with only one reminder from the clinician to manage one piece of information at a time.  EXPRESSIVE LANGUAGE When presented with picture stimuli and definitions of the parts of speech, Natasha [name] produced 3 nouns, verbs, and adjectives for each photo with 85% accuracy with minimal cues (i.e. reminders to look at examples) from the clinician.   Assessment / Recommendations / Plan   Plan Continue with current plan of care   Progression Toward Goals   Progression toward goals Progressing toward goals          SLP Education - 10/08/15 1714    Education provided Yes   Education Details Cognitive and communication strategies   Person(s) Educated Patient   Methods Explanation   Comprehension Verbalized understanding            SLP Long Term Goals - 09/20/15 1433    SLP LONG TERM GOAL #1   Title Patient will demonstrate functional cognitive-communication skills for independent completion of personal responsibilities.   Time 8   Period Weeks   Status Partially Met   SLP LONG TERM GOAL #4   Title Patient will complete memory strategy activities with 80% accuracy.   Time 8   Period Weeks   Status Partially Met   SLP LONG TERM GOAL #5   Title Patient will generate grammatical and cogent sentences to complete abstract/complex linguistic tasks with 80% accuracy   Time 8   Period Weeks   Status Partially Met          Plan -  10/08/15 1715    Clinical Impression Statement Natasha [name] continues to make progress on her reasoning and language skills. She has many effective strategies but needs frequent reminders to use them. She will benefit from continued skilled intervention to generalize her skills to ADLs.   Speech Therapy Frequency 2x / week   Duration Other (comment)   Treatment/Interventions Compensatory techniques;Cognitive reorganization;Internal/external aids;Functional tasks;SLP instruction and feedback;Compensatory strategies;Patient/family education   Potential to Achieve Goals Good   Potential Considerations Ability to learn/carryover  information;Cooperation/participation level;Previous level of function;Severity of impairments;Family/community support   SLP Home Exercise Plan identify barriers for ADLs and independence   Consulted and Agree with Plan of Care Patient      Patient will benefit from skilled therapeutic intervention in order to improve the following deficits and impairments:   Cognitive communication deficit    Problem List There are no active problems to display for this patient.   Leroy Kennedy 10/08/2015, 5:16 PM  [place] MAIN [facility] SERVICES [address] [place], [place], [address] Phone: [phone]   Fax:  [phone]   Name: Natasha [name] MRN: [id number] Date of Birth: 1985/10/03

## 2015-10-08 NOTE — Therapy (Addendum)
[place] [facility] [address] [place], [place], [address] Phone: [phone]   Fax:  [phone]  Physical Therapy Treatment  Patient Details  Name: Natasha [name] MRN: [id number] Date of Birth: 03/06/1986 Referring Provider: Doristine MangoWhite elizabeth  Encounter Date: 10/08/2015      Natasha [name] End of Session - 10/08/15 1443    Visit Number 44   Number of Visits 49   Natasha [name] Start Time 1305   Natasha [name] Stop Time 1343   Natasha [name] Time Calculation (min) 38 min   Equipment Utilized During Treatment Gait belt   Activity Tolerance Patient tolerated treatment well   [facility] tasks assessed/performed      Past Medical History  Diagnosis Date  . Restless leg syndrome unk  . Polysubstance abuse     Past Surgical History  Procedure Laterality Date  . Cholecystectomy    . Tubal ligation      There were no vitals filed for this visit.      Subjective Assessment - 10/08/15 1442    Subjective Natasha [name] reports she is doing well. Feels that she is improving.   Currently in Pain? No/denies     Therapeutic exercise:  TM walking .8 to 1.0 miles / hour x 8 minutes with elevation of 1 progressing to 2 Leg press x 20 x 3 with 100 lbs LAQs 2x20 with 10# Squats x 10 x 2 Step ups x 20 x 2 on first step Eccentric side-step downs x 20 on first step  Patient needs occasional verbal cueing to improve posture and cueing to correctly perform exercises slowly for eccentric strengthening. Therapeutic rest breaks for energy conservation.                            Natasha [name] Education - 10/08/15 1442    Education provided Yes   Education Details heel to toe gait pattern   Person(s) Educated Patient   Methods Explanation;Demonstration   Comprehension Verbalized understanding;Returned demonstration             Natasha [name] Long Term Goals - 08/08/15 1647    Natasha [name] LONG TERM GOAL #1   Title Patient will reduce timed up and go to <11 seconds  to reduce fall risk and demonstrate improved transfer/gait ability.   Time 12   Period Weeks   Status On-going   Natasha [name] LONG TERM GOAL #2   Title Patient will increase six minute walk test distance to >1000 for progression to community ambulator and improve gait ability   Time 12   Period Weeks   Status On-going   Natasha [name] LONG TERM GOAL #3   Title Patient will increase 10 meter walk test to >1.1462m/s as to improve gait speed for better community ambulation and to reduce fall    Time 12   Period Weeks   Status On-going   Natasha [name] LONG TERM GOAL #4   Title Patient will tolerate 5 seconds of single leg stance without loss of balance to improve ability to get in and out of shower safely   Time 12   Period Weeks   Status On-going   Natasha [name] LONG TERM GOAL #5   Title Patient (< 30 years old) will complete five times sit to stand test in < 10 seconds indicating an increased LE strength and improved balance   Time 12   Period Weeks   Status On-going  Plan - 10/08/15 1444    Clinical Impression Statement Natasha [name] gradually progressing towards functional goals. Her ambulation is smoother with B loftstrands and improved heel strike. She will benefit from continued dynamic balance, strength and gait training.   Rehab Potential Good   Natasha [name] Frequency 2x / week   Natasha [name] Duration 12 weeks   Natasha [name] Treatment/Interventions Therapeutic activities;Therapeutic exercise;Balance training;Neuromuscular re-education;Stair training;Gait training;Patient/family education   Natasha [name] Next Visit Plan strengthening and balance training; gait training   Natasha [name] Home Exercise Plan balance in corner   Consulted and Agree with Plan of Care Patient      Patient will benefit from skilled therapeutic intervention in order to improve the following deficits and impairments:  Abnormal gait, Decreased balance, Decreased endurance, Decreased mobility, Difficulty walking, Decreased cognition, Decreased safety awareness, Decreased strength, Impaired  UE functional use, Obesity  Visit Diagnosis: Muscle weakness (generalized)  Difficulty in walking, not elsewhere classified  Unsteadiness on feet  Other lack of coordination     Problem List There are no active problems to display for this patient.   Natasha [name], Natasha [name], Natasha [name]  10/08/2015, 4:44 PM [phone]  [facility] [place] MAIN [facility] SERVICES [address] [place], [place], [address] Phone: [phone]   Fax:  [phone]  Name: Natasha [name] MRN: [id number] Date of Birth: 27-Mar-1986
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
## 2015-10-08 NOTE — Patient Instructions (Addendum)
OT TREATMENT    Therapeutic Exercise:   Pt. worked on bilateral UE strengthening with the SciFit. Pt. Worked for 8 min. Total with forward and reverse motion with constant supervision. 3# dumbbell ex. for elbow flexion and extension,  3# for forearm supination/pronation, wrist flexion/extension, and radial deviation. Pt. required rest breaks and verbal cues for proper technique. Pt. Worked on the EZ board with the right hand at various vertical angles, while holding a 2# weight in the opposite hand. Pt. worked on these to exercises to improve E[employer] Corporationhaircare skills including using a blow dryer with one hand, while using a brush with the other hand.

## 2015-10-10 ENCOUNTER — Ambulatory Visit: Payer: Commercial Managed Care - HMO | Admitting: Physical Therapy

## 2015-10-10 ENCOUNTER — Ambulatory Visit: Payer: Commercial Managed Care - HMO | Admitting: Occupational Therapy

## 2015-10-10 ENCOUNTER — Ambulatory Visit: Payer: Commercial Managed Care - HMO | Admitting: Speech Pathology

## 2015-10-15 ENCOUNTER — Encounter: Payer: Self-pay | Admitting: Speech Pathology

## 2015-10-15 ENCOUNTER — Ambulatory Visit: Payer: Commercial Managed Care - HMO | Admitting: Speech Pathology

## 2015-10-15 ENCOUNTER — Ambulatory Visit: Payer: Commercial Managed Care - HMO | Admitting: Physical Therapy

## 2015-10-15 ENCOUNTER — Ambulatory Visit: Payer: Commercial Managed Care - HMO | Admitting: Occupational Therapy

## 2015-10-15 ENCOUNTER — Encounter: Payer: Self-pay | Admitting: Physical Therapy

## 2015-10-15 DIAGNOSIS — R262 Difficulty in walking, not elsewhere classified: Secondary | ICD-10-CM

## 2015-10-15 DIAGNOSIS — R41841 Cognitive communication deficit: Secondary | ICD-10-CM

## 2015-10-15 DIAGNOSIS — M6281 Muscle weakness (generalized): Secondary | ICD-10-CM

## 2015-10-15 DIAGNOSIS — R278 Other lack of coordination: Secondary | ICD-10-CM
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
## 2015-10-15 NOTE — Therapy (Signed)
[place] [facility] MAIN [facility] SERVICES [address] [place], [place], [address] Phone: [phone]   Fax:  [phone]  Physical Therapy Treatment  Patient Details  Name: Natasha [name] MRN: [id number] Date of Birth: 1985-11-07 Referring Provider: Doristine Mango  Encounter Date: 10/15/2015      PT End of Session - 10/15/15 1323    Visit Number 45   Number of Visits 49   PT Start Time 1301   PT Stop Time 1341   PT Time Calculation (min) 40 min   Equipment Utilized During Treatment Gait belt   Activity Tolerance Patient tolerated treatment well   Behavior During Therapy [facility] for tasks assessed/performed      Past Medical History  Diagnosis Date  . Restless leg syndrome unk  . Polysubstance abuse     Past Surgical History  Procedure Laterality Date  . Cholecystectomy    . Tubal ligation      There were no vitals filed for this visit.      Subjective Assessment - 10/15/15 1301    Subjective Pt reports she is doing well. She was sick the end of last week but is now feeling better.   Currently in Pain? No/denies      Gait:  TM x5 minutes speed 1.0 elevation 1% with 1 UE support, x2 minutes without UE support speed 0.6 elevation 1% Increased difficulty without UE support. Min guard during this exercise. Occasional LOB with self correction.  Ambulation x 500 ft with 1 loft strand crutch on level and unlevel surfaces outside.  Cues for heel strike and increased step length.   Therex:  Leg press x 20 x 2 with 120 lbs Wall Squats x 10 x 2  Standing hip SLR, abd, ext with BTB 2x10 each  Patient needs occasional verbal cueing to improve posture and cueing to correctly perform exercises slowly for eccentric strengthening. Therapeutic rest breaks for energy conservation.                            PT Education - 10/15/15 1323    Education provided Yes   Education Details safety walking on unever surfaces   Person(s) Educated Patient   Methods Explanation;Demonstration   Comprehension Verbalized understanding;Returned demonstration             PT Long Term Goals - 08/08/15 1647    PT LONG TERM GOAL #1   Title Patient will reduce timed up and go to <11 seconds to reduce fall risk and demonstrate improved transfer/gait ability.   Time 12   Period Weeks   Status On-going   PT LONG TERM GOAL #2   Title Patient will increase six minute walk test distance to >1000 for progression to community ambulator and improve gait ability   Time 12   Period Weeks   Status On-going   PT LONG TERM GOAL #3   Title Patient will increase 10 meter walk test to >1.24m/s as to improve gait speed for better community ambulation and to reduce fall    Time 12   Period Weeks   Status On-going   PT LONG TERM GOAL #4   Title Patient will tolerate 5 seconds of single leg stance without loss of balance to improve ability to get in and out of shower safely   Time 12   Period Weeks   Status On-going   PT LONG TERM GOAL #5   Title Patient (< 60  years old) will complete five times sit to stand test in < 10 seconds indicating an increased LE strength and improved balance   Time 12   Period Weeks   Status On-going               Plan - 10/15/15 1331    Clinical Impression Statement Pt's strength, balance and gait gradually improving. She was able ambulate with 1 UE support and with no UE support on the TM. She is not yet safe to ambulate regularly without B loft strand crutches independently. Continued high level balance, strengthening and gait training with decreased support will be beneficial to progress towards goals.   Rehab Potential Good   PT Frequency 2x / week   PT Duration 12 weeks   PT Treatment/Interventions Therapeutic activities;Therapeutic exercise;Balance training;Neuromuscular re-education;Stair training;Gait training;Patient/family education   PT Next Visit Plan strengthening and balance  training; gait training   PT Home Exercise Plan balance in corner   Consulted and Agree with Plan of Care Patient      Patient will benefit from skilled therapeutic intervention in order to improve the following deficits and impairments:  Abnormal gait, Decreased balance, Decreased endurance, Decreased mobility, Difficulty walking, Decreased cognition, Decreased safety awareness, Decreased strength, Impaired UE functional use, Obesity  Visit Diagnosis: Muscle weakness (generalized)  Difficulty in walking, not elsewhere classified  Other lack of coordination     Problem List There are no active problems to display for this patient.   Delmer Kowalski Lucillie GarfinkelJo Ahijah Devery, PT, DPT  10/15/2015, 1:45 PM [phone]   [facility] MAIN [facility] SERVICES [address] [place], [place], [address] Phone: [phone]   Fax:  [phone]  Name: Natasha [name] MRN: [id number] Date of Birth: 06/17/1985

## 2015-10-15 NOTE — Patient Instructions (Signed)
OT TREATMENT    Therapeutic Exercise:  Pt. performed 3# dowel ex. For UE strengthening secondary to weakness. Bilateral shoulder flexion, chest press, circular patterns, and elbow flexion/extension were performed, with ace wrap in place on the left hand. 3# dumbbell ex. for elbow flexion and extension, forearm supination/pronation, wrist flexion/extension, and radial deviation. Pt. Requires cues for set-up.  Self-care:   Pt. Worked on functional math calculations for dollar amounts in preparation for managing bills. Pt. requires cues to initiate, and required fewer cues while calculating amounts with improved accuracy for smaller dollar amounts. Pt. Requires verbal cues for larger, more complex dollar amounts, and Increased time to complete.

## 2015-10-15 NOTE — Therapy (Signed)
[place] MAIN [facility] SERVICES [address] [place], [place], [address] Phone: [phone]   Fax:  [phone]  Speech Language Pathology Treatment  Patient Details  Name: Natasha [name] MRN: [id number] Date of Birth: [birth year] Referring Provider: Dr. Janene Harvey  Encounter Date: 10/15/2015      End of Session - 10/15/15 1714    Visit Number 22   Number of Visits 52   Date for SLP Re-Evaluation 11/20/15   SLP Start Time 1500   SLP Stop Time  1600   SLP Time Calculation (min) 60 min   Activity Tolerance Patient tolerated treatment well      Past Medical History  Diagnosis Date  . Restless leg syndrome unk  . Polysubstance abuse     Past Surgical History  Procedure Laterality Date  . Cholecystectomy    . Tubal ligation      There were no vitals filed for this visit.      Subjective Assessment - 10/15/15 1713    Subjective Natasha [name] was alert and engaged throughout the session. She states that things are going well at home and feels she isn't limited by her impairments.   Currently in Pain? No/denies               ADULT SLP TREATMENT - 10/15/15 0001    General Information   Behavior/Cognition Alert;Cooperative;Pleasant mood   HPI TBI   Treatment Provided   Treatment provided Cognitive-Linquistic   Pain Assessment   Pain Assessment No/denies pain   Cognitive-Linquistic Treatment   Treatment focused on Cognition   Skilled Treatment READING COMPREHENSION: Natasha [name] read a mystery story (grade 6-8) and then answered comprehension questions with 100% accuracy with minimal cues from the clinician mainly consisting of open ended questions. She solved the mystery with moderate cues (areas of the text to search). REASONING She solved a Perplexor Level A independently. EXPRESSIVE LANGUAGE Natasha [name] was provided with a geometric picture and asked to describe it to the clinician in such a way that the clinician could  reproduce it. She did this with approximately 50% effectiveness, successfully describing some of the broader aspects of the picture, but struggling to use language to describe the shapes' relation in space to each other.   Assessment / Recommendations / Plan   Plan Continue with current plan of care   Progression Toward Goals   Progression toward goals Progressing toward goals          SLP Education - 10/15/15 1713    Education provided Yes   Education Details Cognitive strategies   Person(s) Educated Patient   Methods Explanation   Comprehension Verbalized understanding            SLP Long Term Goals - 09/20/15 1433    SLP LONG TERM GOAL #1   Title Patient will demonstrate functional cognitive-communication skills for independent completion of personal responsibilities.   Time 8   Period Weeks   Status Partially Met   SLP LONG TERM GOAL #4   Title Patient will complete memory strategy activities with 80% accuracy.   Time 8   Period Weeks   Status Partially Met   SLP LONG TERM GOAL #5   Title Patient will generate grammatical and cogent sentences to complete abstract/complex linguistic tasks with 80% accuracy   Time 8   Period Weeks   Status Partially Met          Plan - 10/15/15 1714    Clinical Impression Statement  Natasha [name] continues to make progress on her cognitive-linguistic skills. She will benefit from continued skilled intervention to identify communication barriers to independence.   Speech Therapy Frequency 2x / week   Duration Other (comment)   Treatment/Interventions Compensatory techniques;Cognitive reorganization;Internal/external aids;Functional tasks;SLP instruction and feedback;Compensatory strategies;Patient/family education   Potential to Achieve Goals Good   Potential Considerations Ability to learn/carryover information;Cooperation/participation level;Previous level of function;Severity of impairments;Family/community support   SLP Home  Exercise Plan identify barriers for ADLs and independence   Consulted and Agree with Plan of Care Patient      Patient will benefit from skilled therapeutic intervention in order to improve the following deficits and impairments:   Cognitive communication deficit    Problem List There are no active problems to display for this patient.   Leroy Kennedy 10/15/2015, 5:14 PM  [place] MAIN [facility] SERVICES [address] [place], [place], [address] Phone: [phone]   Fax:  [phone]   Name: Natasha [name] MRN: [id number] Date of Birth: 1985/09/01

## 2015-10-17 ENCOUNTER — Ambulatory Visit: Payer: Commercial Managed Care - HMO | Admitting: Speech Pathology

## 2015-10-17 ENCOUNTER — Encounter: Payer: Self-pay | Admitting: Physical Therapy

## 2015-10-17 ENCOUNTER — Ambulatory Visit: Payer: Commercial Managed Care - HMO | Admitting: Occupational Therapy

## 2015-10-17 ENCOUNTER — Encounter: Payer: Self-pay | Admitting: Speech Pathology

## 2015-10-17 ENCOUNTER — Ambulatory Visit: Payer: Commercial Managed Care - HMO | Admitting: Physical Therapy

## 2015-10-17 DIAGNOSIS — R41841 Cognitive communication deficit: Secondary | ICD-10-CM | POA: Diagnosis not present

## 2015-10-17 DIAGNOSIS — R262 Difficulty in walking, not elsewhere classified: Secondary | ICD-10-CM

## 2015-10-17 DIAGNOSIS — M6281 Muscle weakness (generalized): Secondary | ICD-10-CM

## 2015-10-17 DIAGNOSIS — R278 Other lack of coordination: Secondary | ICD-10-CM

## 2015-10-17 NOTE — Patient Instructions (Signed)
OT TREATMENT     Neuro muscular re-education:  Pt. Worked on [facility] skills with focusing on using extension tweezers to grasp 1/8" objects, and placing them on a small board.  Pt. Had difficulty controlling placement of the objects without knocking over the other pegs. Pt. Removed them using thumb opposition to the 2nd through 5th digits.  Therapeutic Exercise:  Pt. Worked on the [employer] for 8 min. With cues for hand placement and constant monitoring of her hands on the handles during the ex.  Pt. Worked on level 2.5. Pt. Worked on the digiflex 1.5#.Pt. performed gross gripping with grip strengthener. Pt. worked on sustaining grip while grasping pegs and reaching at various heights.

## 2015-10-17 NOTE — Therapy (Signed)
[place] MAIN [facility] SERVICES [address] [place], [place], [address] Phone: [phone]   Fax:  [phone]  Speech Language Pathology Treatment  Patient Details  Name: LENOIR FACCHINI MRN: [id number] Date of Birth: [birth year] Referring Provider: Dr. Janene Harvey  Encounter Date: 10/17/2015      End of Session - 10/17/15 1608    Visit Number 33   Number of Visits 61   Date for SLP Re-Evaluation 11/20/15   SLP Start Time 27   SLP Stop Time  1700   SLP Time Calculation (min) 60 min   Activity Tolerance Patient tolerated treatment well      Past Medical History  Diagnosis Date  . Restless leg syndrome unk  . Polysubstance abuse     Past Surgical History  Procedure Laterality Date  . Cholecystectomy    . Tubal ligation      There were no vitals filed for this visit.      Subjective Assessment - 10/17/15 1608    Subjective Ms. Sox was alert and engaged throughout the session. She enjoys spending time with her children on the weekends and feels she can communicate sufficiently with them.   Currently in Pain? No/denies               ADULT SLP TREATMENT - 10/17/15 0001    General Information   Behavior/Cognition Alert;Cooperative;Pleasant mood   HPI TBI   Treatment Provided   Treatment provided Cognitive-Linquistic   Pain Assessment   Pain Assessment No/denies pain   Cognitive-Linquistic Treatment   Treatment focused on Cognition   Skilled Treatment READING COMPREHENSION: Ms. Coca read a mystery story (grade 6-8), solved the mystery, and then answered comprehension questions with 100% accuracy with minimal cues from the clinician mainly consisting of open ended questions. REASONING She solved a Perplexor Level A with 4 components to manipulate with moderate cues from the clinician including parsing the clues into manageable pieces. EXPRESSIVE LANGUAGE Ms. Combes was provided with a picture of an item and asked  to describe it to the clinician in such a way that the clinician could reproduce it in a drawing. She did this with approximately 50% accuracy independently and 85% accuracy with moderate cues from the clinician (probing questions).   Assessment / Recommendations / Plan   Plan Continue with current plan of care   Progression Toward Goals   Progression toward goals Progressing toward goals          SLP Education - 10/17/15 1608    Education provided Yes   Education Details Cognitive strategies   Person(s) Educated Patient   Methods Explanation   Comprehension Verbalized understanding            SLP Long Term Goals - 09/20/15 1433    SLP LONG TERM GOAL #1   Title Patient will demonstrate functional cognitive-communication skills for independent completion of personal responsibilities.   Time 8   Period Weeks   Status Partially Met   SLP LONG TERM GOAL #4   Title Patient will complete memory strategy activities with 80% accuracy.   Time 8   Period Weeks   Status Partially Met   SLP LONG TERM GOAL #5   Title Patient will generate grammatical and cogent sentences to complete abstract/complex linguistic tasks with 80% accuracy   Time 8   Period Weeks   Status Partially Met          Plan - 10/17/15 1608    Clinical Impression Statement  Ms. Carusone continues to make progress using her cognitive strategies, especially using routines and patterns to help her solve familiar problems. She seems challenged when asked to use higher level language or to use her language for more than basic communication. She will benefit from continued skilled intervention to address more complex expressive language.   Speech Therapy Frequency 2x / week   Duration Other (comment)   Treatment/Interventions Compensatory techniques;Cognitive reorganization;Internal/external aids;Functional tasks;SLP instruction and feedback;Compensatory strategies;Patient/family education   Potential to Achieve Goals  Good   Potential Considerations Ability to learn/carryover information;Cooperation/participation level;Previous level of function;Severity of impairments;Family/community support   SLP Home Exercise Plan identify barriers for ADLs and independence   Consulted and Agree with Plan of Care Patient      Patient will benefit from skilled therapeutic intervention in order to improve the following deficits and impairments:   Cognitive communication deficit    Problem List There are no active problems to display for this patient.   Leroy Kennedy 10/17/2015, 4:11 PM  [place] MAIN [facility] SERVICES [address] [place], [place], [address] Phone: [phone]   Fax:  [phone]   Name: XANTHE COUILLARD MRN: [id number] Date of Birth: 1986-03-27
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
## 2015-10-17 NOTE — Therapy (Signed)
[facility] MAIN [facility] SERVICES [address] [place], [place], [address] Phone: [phone]   Fax:  [phone]  Physical Therapy Treatment  Patient Details  Name: BERLYNN WARSAME MRN: [id number] Date of Birth: 1985/10/21 Referring Provider: Doristine Mango  Encounter Date: 10/17/2015      PT End of Session - 10/17/15 1340    Visit Number 46   Number of Visits 49   PT Start Time 1300   PT Stop Time 1345   PT Time Calculation (min) 45 min   Equipment Utilized During Treatment Gait belt   Activity Tolerance Patient tolerated treatment well   Behavior During Therapy [facility] for tasks assessed/performed      Past Medical History  Diagnosis Date  . Restless leg syndrome unk  . Polysubstance abuse     Past Surgical History  Procedure Laterality Date  . Cholecystectomy    . Tubal ligation      There were no vitals filed for this visit.      Subjective Assessment - 10/17/15 1336    Subjective Pt reports she is doing well. She was sick the end of last week but is now feeling better.   Currently in Pain? No/denies     Therapeutic exercise: standing hip abd with YTB x 20  side stepping left and right in parallel bars 10 feet x 3 standing on blue foam with cone reaching x 20 across midline step ups from floor to 6 inch stool x 20 bilateral sit to stand x 10 marching in parallel bars x 20 Stepping fwd from foam x 20  Stepping over bolster left and right x 10  Side stepping on blue  foam balance beam x 5 lengths of the parallel bars  4 square fwd/bwd, side to side stepping/ diagonal stepping  Pt needed mod cueing with CGA while performing stability tasks.                           PT Education - 10/17/15 1339    Education provided Yes   Education Details safety with ambulation   Person(s) Educated Patient   Methods Explanation   Comprehension Verbalized understanding             PT Long Term Goals -  08/08/15 1647    PT LONG TERM GOAL #1   Title Patient will reduce timed up and go to <11 seconds to reduce fall risk and demonstrate improved transfer/gait ability.   Time 12   Period Weeks   Status On-going   PT LONG TERM GOAL #2   Title Patient will increase six minute walk test distance to >1000 for progression to community ambulator and improve gait ability   Time 12   Period Weeks   Status On-going   PT LONG TERM GOAL #3   Title Patient will increase 10 meter walk test to >1.41m/s as to improve gait speed for better community ambulation and to reduce fall    Time 12   Period Weeks   Status On-going   PT LONG TERM GOAL #4   Title Patient will tolerate 5 seconds of single leg stance without loss of balance to improve ability to get in and out of shower safely   Time 12   Period Weeks   Status On-going   PT LONG TERM GOAL #5   Title Patient (< 71 years old) will complete five times sit to stand test in < 10  seconds indicating an increased LE strength and improved balance   Time 12   Period Weeks   Status On-going               Plan - 10/17/15 1340    Clinical Impression Statement  Patient loses her balance backwards and has poor foot placement with single leg stepping activities,  and toe catching step during ascending or descending toe tapping.   Rehab Potential Good   PT Frequency 2x / week   PT Duration 12 weeks   PT Treatment/Interventions Therapeutic activities;Therapeutic exercise;Balance training;Neuromuscular re-education;Stair training;Gait training;Patient/family education   PT Next Visit Plan strengthening and balance training; gait training   PT Home Exercise Plan balance in corner   Consulted and Agree with Plan of Care Patient      Patient will benefit from skilled therapeutic intervention in order to improve the following deficits and impairments:  Abnormal gait, Decreased balance, Decreased endurance, Decreased mobility, Difficulty walking, Decreased  cognition, Decreased safety awareness, Decreased strength, Impaired UE functional use, Obesity  Visit Diagnosis: Muscle weakness (generalized)  Difficulty in walking, not elsewhere classified     Problem List There are no active problems to display for this patient.  Ezekiel InaKristine S Eliannah Hinde, PT, DPT [place], [place] 10/17/2015, 1:42 PM   [facility] MAIN [facility] SERVICES [address] [place], [place], [address] Phone: [phone]   Fax:  [phone]  Name: Hector ShadeWhitney B Vetere MRN: [id number] Date of Birth: 04/23/1986

## 2015-10-22 ENCOUNTER — Ambulatory Visit: Payer: Commercial Managed Care - HMO | Admitting: Speech Pathology

## 2015-10-22 ENCOUNTER — Ambulatory Visit: Payer: Commercial Managed Care - HMO | Admitting: Physical Therapy

## 2015-10-22 ENCOUNTER — Encounter: Payer: Self-pay | Admitting: Physical Therapy

## 2015-10-22 ENCOUNTER — Ambulatory Visit: Payer: Commercial Managed Care - HMO | Admitting: Occupational Therapy

## 2015-10-22 DIAGNOSIS — M6281 Muscle weakness (generalized): Secondary | ICD-10-CM

## 2015-10-22 DIAGNOSIS — R262 Difficulty in walking, not elsewhere classified: Secondary | ICD-10-CM

## 2015-10-22 DIAGNOSIS — R41841 Cognitive communication deficit: Secondary | ICD-10-CM

## 2015-10-22 DIAGNOSIS — R278 Other lack of coordination: Secondary | ICD-10-CM
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
## 2015-10-22 NOTE — Therapy (Signed)
[place] [facility]IN [facility] [address] [place], [place], [address] Phone: [phone]   Fax:  [phone]  Physical Therapy Treatment  Patient Details  Name: Natasha ShadeWhitney B [name] MRN: [id number] Date of Birth: 04/30/1985 Referring Provider: Doristine MangoWhite elizabeth  Encounter Date: 10/22/2015      PT End of Session - 10/22/15 1327    Visit Number 47   Number of Visits 49   PT Start Time 1305   PT Stop Time 1345   PT Time Calculation (min) 40 min   Equipment Utilized During Treatment Gait belt   Activity Tolerance Patient tolerated treatment well   Behavior During Therapy [facility] for tasks assessed/performed      Past Medical History  Diagnosis Date  . Restless leg syndrome unk  . Polysubstance abuse     Past Surgical History  Procedure Laterality Date  . Cholecystectomy    . Tubal ligation      There were no vitals filed for this visit.      Subjective Assessment - 10/22/15 1326    Subjective Pt reports she is feeling better.    Currently in Pain? No/denies      Min verbal cues used throughout with increased in postural sway and LOB most seen with narrow base of support and while on uneven surfaces. Continues to have balance deficits typical with diagnosis. Patient performs intermediate level exercises without pain behaviors and needs verbal cuing for postural alignment and head positioning. Supine LE exercises including; sidelying hip abd with leg straight sidelying hip clam x 20   Gait training without AD with CGA and wall 1000 feet with wide base of support and unsteady gait and no loss of balance.                            PT Education - 10/22/15 1327    Education provided Yes   Education Details safety wtihout AD   Person(s) Educated Patient   Methods Explanation   Comprehension Verbalized understanding             PT Long Term Goals - 08/08/15 1647    PT LONG TERM GOAL #1   Title Patient  will reduce timed up and go to <11 seconds to reduce fall risk and demonstrate improved transfer/gait ability.   Time 12   Period Weeks   Status On-going   PT LONG TERM GOAL #2   Title Patient will increase six minute walk test distance to >1000 for progression to community ambulator and improve gait ability   Time 12   Period Weeks   Status On-going   PT LONG TERM GOAL #3   Title Patient will increase 10 meter walk test to >1.140m/s as to improve gait speed for better community ambulation and to reduce fall    Time 12   Period Weeks   Status On-going   PT LONG TERM GOAL #4   Title Patient will tolerate 5 seconds of single leg stance without loss of balance to improve ability to get in and out of shower safely   Time 12   Period Weeks   Status On-going   PT LONG TERM GOAL #5   Title Patient (30 years old) will complete five times sit to stand test in < 10 seconds indicating an increased LE strength and improved balance   Time 12   Period Weeks   Status On-going  Plan - 10/22/15 1328    Clinical Impression Statement Patient continues to have decreased motor control and decreased stepping coordination. She performs LE exercises and gait training wihtout pain behaviors. l    Rehab Potential Good   PT Frequency 2x / week   PT Duration 12 weeks   PT Treatment/Interventions Therapeutic activities;Therapeutic exercise;Balance training;Neuromuscular re-education;Stair training;Gait training;Patient/family education   PT Next Visit Plan strengthening and balance training; gait training   PT Home Exercise Plan balance in corner   Consulted and Agree with Plan of Care Patient      Patient will benefit from skilled therapeutic intervention in order to improve the following deficits and impairments:  Abnormal gait, Decreased balance, Decreased endurance, Decreased mobility, Difficulty walking, Decreased cognition, Decreased safety awareness, Decreased strength, Impaired  UE functional use, Obesity  Visit Diagnosis: Muscle weakness (generalized)  Difficulty in walking, not elsewhere classified     Problem List There are no active problems to display for this patient. Ezekiel InaKristine S Robena Ewy, PT, DPT  [place], [place] 10/22/2015, 1:32 PM  [place] [facility] MAIN [facility] [address] [place], [place], [address] Phone: [phone]   Fax:  [phone]  Name: Natasha ShadeWhitney B [name] MRN: [id number] Date of Birth: 09/04/1985

## 2015-10-22 NOTE — Patient Instructions (Signed)
OT TREATMENT    Neuro muscular re-education:   Pt. [facility] with grasping 1/8" objects using tweezers. Pt. Worked on removing the pegs with 2nd through 5th digits.  Therapeutic Exercise:  Selfcare: Pt. worked Airline pilotkitchen management skills in preparation for cooking/baking including: navigating nutritional food labels,  complex recipes, and  Ingredient substitution tables. Pt. Required verbal cues for nutritional food labels, and ingredient substitution tables.

## 2015-10-23 NOTE — Therapy (Signed)
[place] MAIN [facility] SERVICES [address] [place], [place], [address] Phone: [phone]   Fax:  [phone]  Speech Language Pathology Treatment  Patient Details  Name: Natasha [name] MRN: [id number] Date of Birth: 1986/03/08 Referring Provider: Dr. Janene Harvey  Encounter Date: 10/22/2015      End of Session - 10/23/15 1022    Visit Number 67   Number of Visits 21   Date for SLP Re-Evaluation 11/20/15   SLP Start Time 1500   SLP Stop Time  1553   SLP Time Calculation (min) 53 min   Activity Tolerance Patient tolerated treatment well      Past Medical History  Diagnosis Date  . Restless leg syndrome unk  . Polysubstance abuse     Past Surgical History  Procedure Laterality Date  . Cholecystectomy    . Tubal ligation      There were no vitals filed for this visit.      Subjective Assessment - 10/23/15 1021    Subjective Patient reports that she will miss 2 weeks due to scheduling conflicts.   Currently in Pain? No/denies               ADULT SLP TREATMENT - 10/23/15 0001    General Information   Behavior/Cognition Alert;Cooperative;Pleasant mood   HPI TBI   Treatment Provided   Treatment provided Cognitive-Linquistic   Pain Assessment   Pain Assessment No/denies pain   Cognitive-Linquistic Treatment   Treatment focused on Cognition   Skilled Treatment REASONING: Logic puzzle: requires min cues to solve Perplexor Level A puzzle.  READING COMPREHENSION:  Read mystery story (Grade 6-8) and answer factual questions with 100% accuracy; improved scanning of the text to find answers.  Solved mystery given no cues.  MEMORY:  Requires fewer cues to locate answers in text.  Is recalling "rules" for solving Perplexor puzzles with fewer cues from SLP.   LANGUAGE: Given picture stimulus, generate 2-3 responses to semantic feature analysis questions given mod to max cues.  After working through the semantic feature analysis  questions, patient generated 3 cogent and grammatical sentences regarding the picture.   Assessment / Recommendations / Plan   Plan Continue with current plan of care   Progression Toward Goals   Progression toward goals Progressing toward goals          SLP Education - 10/23/15 1022    Education provided Yes   Education Details Use content words to express herself   Person(s) Educated Patient   Methods Explanation   Comprehension Verbalized understanding            SLP Long Term Goals - 09/20/15 1433    SLP LONG TERM GOAL #1   Title Patient will demonstrate functional cognitive-communication skills for independent completion of personal responsibilities.   Time 8   Period Weeks   Status Partially Met   SLP LONG TERM GOAL #4   Title Patient will complete memory strategy activities with 80% accuracy.   Time 8   Period Weeks   Status Partially Met   SLP LONG TERM GOAL #5   Title Patient will generate grammatical and cogent sentences to complete abstract/complex linguistic tasks with 80% accuracy   Time 8   Period Weeks   Status Partially Met          Plan - 10/23/15 1023    Clinical Impression Statement Ms. Steuck continues to make progress using her cognitive strategies, especially using routines and patterns to help  her solve familiar problems. She seems challenged when asked to use higher level language or to use her language for more than basic communication. She will benefit from continued skilled intervention to address more complex expressive language.      Patient will benefit from skilled therapeutic intervention in order to improve the following deficits and impairments:   Cognitive communication deficit    Problem List There are no active problems to display for this patient.   Leroy Sea, MS/CCC- SLP  Lou Miner 10/23/2015, 10:24 AM  [place] MAIN [facility] SERVICES [address]  [place], [place], [address] Phone: [phone]   Fax:  [phone]   Name: Natasha [name] MRN: [id number] Date of Birth: 09/30/1985

## 2015-10-24 ENCOUNTER — Encounter: Payer: 59 | Admitting: Occupational Therapy

## 2015-10-24 ENCOUNTER — Ambulatory Visit: Payer: 59 | Admitting: Physical Therapy

## 2015-10-24 ENCOUNTER — Encounter: Payer: 59 | Admitting: Speech Pathology

## 2015-10-31 ENCOUNTER — Encounter: Payer: 59 | Admitting: Speech Pathology

## 2015-10-31 ENCOUNTER — Encounter: Payer: 59 | Admitting: Occupational Therapy

## 2015-11-06 ENCOUNTER — Ambulatory Visit: Payer: 59 | Admitting: Physical Therapy

## 2015-11-06 ENCOUNTER — Encounter: Payer: 59 | Admitting: Occupational Therapy

## 2015-11-13 ENCOUNTER — Encounter: Payer: 59 | Admitting: Speech Pathology

## 2015-11-13 ENCOUNTER — Ambulatory Visit: Payer: 59 | Admitting: Physical Therapy

## 2015-11-13 ENCOUNTER — Encounter: Payer: 59 | Admitting: Occupational Therapy

## 2015-11-15 ENCOUNTER — Encounter: Payer: 59 | Admitting: Speech Pathology

## 2015-11-15 ENCOUNTER — Encounter: Payer: 59 | Admitting: Occupational Therapy

## 2015-11-15 ENCOUNTER — Ambulatory Visit: Payer: 59 | Admitting: Physical Therapy

## 2015-11-20 ENCOUNTER — Encounter: Payer: 59 | Admitting: Speech Pathology

## 2015-11-20 ENCOUNTER — Encounter: Payer: 59 | Admitting: Occupational Therapy

## 2015-11-20 ENCOUNTER — Ambulatory Visit: Payer: 59 | Admitting: Physical Therapy

## 2015-11-27 ENCOUNTER — Encounter: Payer: 59 | Admitting: Speech Pathology

## 2015-11-27 ENCOUNTER — Ambulatory Visit: Payer: 59 | Admitting: Physical Therapy

## 2015-11-27 ENCOUNTER — Encounter: Payer: 59 | Admitting: Occupational Therapy

## 2015-11-28 ENCOUNTER — Encounter: Payer: 59 | Admitting: Occupational Therapy

## 2015-11-28 ENCOUNTER — Ambulatory Visit: Payer: 59 | Admitting: Physical Therapy

## 2015-11-28 ENCOUNTER — Encounter: Payer: 59 | Admitting: Speech Pathology

## 2015-11-29 ENCOUNTER — Encounter: Payer: 59 | Admitting: Speech Pathology

## 2015-11-29 ENCOUNTER — Encounter: Payer: 59 | Admitting: Occupational Therapy

## 2015-11-29 ENCOUNTER — Ambulatory Visit: Payer: 59 | Admitting: Physical Therapy

## 2015-12-03 ENCOUNTER — Encounter: Payer: 59 | Admitting: Speech Pathology

## 2015-12-03 ENCOUNTER — Ambulatory Visit: Payer: 59 | Admitting: Physical Therapy

## 2015-12-03 ENCOUNTER — Encounter: Payer: 59 | Admitting: Occupational Therapy

## 2015-12-05 ENCOUNTER — Encounter: Payer: 59 | Admitting: Speech Pathology

## 2015-12-05 ENCOUNTER — Ambulatory Visit: Payer: 59 | Admitting: Physical Therapy

## 2015-12-11 ENCOUNTER — Encounter: Payer: 59 | Admitting: Speech Pathology

## 2015-12-11 ENCOUNTER — Ambulatory Visit: Payer: 59 | Admitting: Physical Therapy

## 2015-12-11 ENCOUNTER — Encounter: Payer: 59 | Admitting: Occupational Therapy

## 2015-12-13 ENCOUNTER — Encounter: Payer: 59 | Admitting: Speech Pathology

## 2015-12-13 ENCOUNTER — Encounter: Payer: 59 | Admitting: Occupational Therapy

## 2015-12-13 ENCOUNTER — Ambulatory Visit: Payer: 59 | Admitting: Physical Therapy

## 2015-12-18 ENCOUNTER — Encounter: Payer: 59 | Admitting: Speech Pathology

## 2015-12-18 ENCOUNTER — Ambulatory Visit: Payer: 59 | Admitting: Physical Therapy

## 2015-12-18 ENCOUNTER — Encounter: Payer: 59 | Admitting: Occupational Therapy

## 2015-12-20 ENCOUNTER — Encounter: Payer: 59 | Admitting: Occupational Therapy

## 2015-12-20 ENCOUNTER — Ambulatory Visit: Payer: 59 | Admitting: Physical Therapy

## 2015-12-20 ENCOUNTER — Encounter: Payer: 59 | Admitting: Speech Pathology

## 2015-12-25 ENCOUNTER — Encounter: Payer: 59 | Admitting: Occupational Therapy

## 2015-12-25 ENCOUNTER — Encounter: Payer: 59 | Admitting: Speech Pathology

## 2015-12-25 ENCOUNTER — Ambulatory Visit: Payer: 59 | Admitting: Physical Therapy

## 2016-01-02 ENCOUNTER — Encounter: Payer: Self-pay | Admitting: Physical Therapy

## 2016-01-02 ENCOUNTER — Ambulatory Visit: Payer: Commercial Managed Care - HMO | Admitting: Physical Therapy

## 2016-01-02 ENCOUNTER — Ambulatory Visit: Payer: Commercial Managed Care - HMO | Attending: Pediatrics | Admitting: Occupational Therapy

## 2016-01-02 DIAGNOSIS — R262 Difficulty in walking, not elsewhere classified: Secondary | ICD-10-CM | POA: Diagnosis present

## 2016-01-02 DIAGNOSIS — R531 Weakness: Secondary | ICD-10-CM | POA: Insufficient documentation

## 2016-01-02 DIAGNOSIS — R2681 Unsteadiness on feet: Secondary | ICD-10-CM | POA: Insufficient documentation

## 2016-01-02 DIAGNOSIS — M6281 Muscle weakness (generalized): Secondary | ICD-10-CM | POA: Insufficient documentation

## 2016-01-02 DIAGNOSIS — R278 Other lack of coordination: Secondary | ICD-10-CM | POA: Diagnosis present
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
## 2016-01-02 NOTE — Therapy (Signed)
[place] [facility] MAIN [facility] SERVICES [address] [place], [place], [address] Phone: [phone]   Fax:  [phone]  Physical Therapy Evaluation  Patient Details  Name: JAELA YEPEZ MRN: [id number] Date of Birth: 1985-11-13 Referring Provider: Orene Desanctis  Encounter Date: 01/02/2016      PT End of Session - 01/02/16 1324    Visit Number 1   Number of Visits 25   Date for PT Re-Evaluation 03/26/16   PT Start Time 1400   PT Stop Time 1445   PT Time Calculation (min) 45 min   Equipment Utilized During Treatment Gait belt   Activity Tolerance Patient tolerated treatment well   Behavior During Therapy [facility] for tasks assessed/performed      Past Medical History:  Diagnosis Date  . Polysubstance abuse   . Restless leg syndrome unk    Past Surgical History:  Procedure Laterality Date  . CHOLECYSTECTOMY    . TUBAL LIGATION      There were no vitals filed for this visit.       Subjective Assessment - 01/02/16 1357    Subjective Patient has been able to walk inside her home without the loftstrand crutches and she uses the loftstrand crutches outside.    Pertinent History TBI   Patient Stated Goals to be able to walk without the loftstrand crutches   Currently in Pain? No/denies            [facility] PT Assessment - 01/02/16 0001      Assessment   Medical Diagnosis TBI   Referring Provider BEHLING, KAREN   Onset Date/Surgical Date 10/02/14   Hand Dominance Right     Restrictions   Weight Bearing Restrictions No     Balance Screen   Has the patient fallen in the past 6 months No   Has the patient had a decrease in activity level because of a fear of falling?  Yes   Is the patient reluctant to leave their home because of a fear of falling?  No     Home Environment   Living Environment Private residence   Available Help at Discharge Family   Type of Home Mobile home   Home Access Stairs to enter   Entrance Stairs-Number  of Steps 4   Entrance Stairs-Rails Right   Home Layout One level   Home Equipment Other (comment)   Additional Comments --  loftstrand crutches     Prior Function   Level of Independence Independent with basic ADLs;Independent with household mobility with device   Vocation On disability   Leisure walking, TV , be with kids     Cognition   Overall Cognitive Status Within Functional Limits for tasks assessed      PAIN: no reports of pain  POSTURE: WFL   PROM/AROM: WFL  STRENGTH:  Graded on a 0-5 scale Muscle Group Left Right  Shoulder flex    Shoulder Abd    Shoulder Ext    Shoulder IR/ER    Elbow    Wrist/hand    Hip Flex 4/5 4/5  Hip Abd 4/5 4/5  Hip Add 2/5 2/5  Hip Ext 3/5 3/5  Hip IR/ER    Knee Flex 5/5 5/5  Knee Ext 5/5 5/5  Ankle DF 5/5 5/5  Ankle PF 5/5 5/5   SENSATION: WNL   FUNCTIONAL MOBILITY: indpendent   BALANCE: unable to tandem stand or single leg stand   GAIT: Patient is ambulating with loftstrand crutches outside the  house and no AD inside the house OUTCOME MEASURES: TEST Outcome Interpretation  5 times sit<>stand 12.71sec >30 yo, >15 sec indicates increased risk for falls  10 meter walk test    .87             m/s <1.0 m/s indicates increased risk for falls; limited community ambulator  Timed up and Go    14.76             sec <14 sec indicates increased risk for falls  6 minute walk test  650              Feet 1000 feet is community ambulator                                  PT Education - 01-27-2016 1324    Education provided Yes   Education Details poc   Person(s) Educated Patient   Methods Explanation   Comprehension Verbalized understanding             PT Long Term Goals - 01/27/2016 1432      PT LONG TERM GOAL #1   Title Patient will reduce timed up and go to <11 seconds to reduce fall risk and demonstrate improved transfer/gait ability.   Time 12   Period Weeks   Status New     PT LONG TERM GOAL  #2   Title Patient will increase six minute walk test distance to >1000 for progression to community ambulator and improve gait ability   Time 12   Period Weeks   Status New     PT LONG TERM GOAL #3   Title Patient will increase 10 meter walk test to >1.82m/s as to improve gait speed for better community ambulation and to reduce fall    Time 12   Period Weeks   Status New     PT LONG TERM GOAL #4   Title Patient will tolerate 5 seconds of single leg stance without loss of balance to improve ability to get in and out of shower safely   Time 12   Period Weeks   Status New     PT LONG TERM GOAL #5   Title Patient (< 30 years old) will complete five times sit to stand test in < 10 seconds indicating an increased LE strength and improved balance   Time 12   Period Weeks   Status New               Plan - 01-27-16 1407    Clinical Impression Statement Patient has decreased static and dynamic standing balance and decreased coordinaiton of BLE and decreased LE strength .    Rehab Potential Good   PT Frequency 2x / week   PT Duration 12 weeks   PT Treatment/Interventions Therapeutic activities;Therapeutic exercise;Balance training;Neuromuscular re-education;Stair training;Gait training;Patient/family education   PT Next Visit Plan strengthening and balance training; gait training   PT Home Exercise Plan balance in corner   Consulted and Agree with Plan of Care Patient      Patient will benefit from skilled therapeutic intervention in order to improve the following deficits and impairments:  Abnormal gait, Decreased balance, Decreased endurance, Decreased mobility, Difficulty walking, Decreased cognition, Decreased safety awareness, Decreased strength, Impaired UE functional use, Obesity  Visit Diagnosis: Muscle weakness (generalized)  Difficulty in walking, not elsewhere classified  Unsteadiness on feet      G-Codes - Jan 27, 2016  1326    Functional Assessment Tool Used TUG,  10 MW, 6 MW, 5 x sit to stand   Functional Limitation Mobility: Walking and moving around   Mobility: Walking and Moving Around Current Status [phone]) At least 40 percent but less than 60 percent impaired, limited or restricted   Mobility: Walking and Moving Around Goal Status [phone]) At least 20 percent but less than 40 percent impaired, limited or restricted       Problem List There are no active problems to display for this patient.  Ezekiel InaKristine S [place], PT, DPT [place] 01/02/2016, 2:35 PM  [place] [facility] MA[facility] [address] [place], [place], [address] Phone: [phone]   Fax:  [phone]  Name: Hector ShadeWhitney B Vowell MRN: [id number] Date of Birth: 10/25/1985

## 2016-01-07 ENCOUNTER — Ambulatory Visit: Payer: 59 | Admitting: Physical Therapy

## 2016-01-07 ENCOUNTER — Encounter: Payer: 59 | Admitting: Occupational Therapy

## 2016-01-07 ENCOUNTER — Ambulatory Visit: Payer: Commercial Managed Care - HMO | Admitting: Occupational Therapy

## 2016-01-07 DIAGNOSIS — R278 Other lack of coordination: Secondary | ICD-10-CM

## 2016-01-07 DIAGNOSIS — M6281 Muscle weakness (generalized): Secondary | ICD-10-CM | POA: Diagnosis not present

## 2016-01-07 DIAGNOSIS — R531 Weakness: Secondary | ICD-10-CM
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
## 2016-01-07 NOTE — Patient Instructions (Signed)
OT TREATMENT     Neuro muscular re-education:  Pt. performed [facility] skills training to improve speed and dexterity needed for ADL tasks and writing. Pt. demonstrated grasping 1 inch sticks,  inch cylindrical collars, and  inch flat washers on the Purdue pegboard. Pt. performed grasping each item with her 2nd digit and thumb, and storing them in the palm.   Therapeutic Exercise:  Pt. performed gross gripping with grip strengthener. Pt. worked on sustaining grip while grasping pegs and reaching at various heights. Gripper was placed in the resistive slot with the white resistive spring. Pt. Performed with BUEs.Pt. Worked on pinch strengthening in the left hand for lateral, and 3pt. pinch using yellow, red, green, and blue resistive clips. Pt. worked on placing the clips at various vertical and horizontal angles. Tactile and verbal cues were required for eliciting the desired movement. Pt. Worked on pinch strengthening in the left hand for lateral, and 3pt. pinch using yellow, red, green, and blue resistive clips. Pt. worked on placing the clips at various vertical and horizontal angles. Tactile and verbal cues were required for eliciting the desired movement.

## 2016-01-09 ENCOUNTER — Encounter: Payer: Self-pay | Admitting: Physical Therapy

## 2016-01-09 ENCOUNTER — Encounter: Payer: 59 | Admitting: Occupational Therapy

## 2016-01-09 ENCOUNTER — Ambulatory Visit: Payer: Commercial Managed Care - HMO | Admitting: Occupational Therapy

## 2016-01-09 ENCOUNTER — Ambulatory Visit: Payer: Commercial Managed Care - HMO | Admitting: Physical Therapy

## 2016-01-09 ENCOUNTER — Ambulatory Visit: Payer: 59 | Admitting: Physical Therapy

## 2016-01-09 DIAGNOSIS — R278 Other lack of coordination: Secondary | ICD-10-CM

## 2016-01-09 DIAGNOSIS — R262 Difficulty in walking, not elsewhere classified: Secondary | ICD-10-CM

## 2016-01-09 DIAGNOSIS — M6281 Muscle weakness (generalized): Secondary | ICD-10-CM | POA: Diagnosis not present
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
## 2016-01-09 NOTE — Patient Instructions (Signed)
OT TREATMENT    Neuro muscular re-education:  Pt. Worked on grasping, flipping, manipulating, stacking, and turning [place] style discs.   Therapeutic Exercise:  Pt. performed 2.5# dowel ex. For UE strengthening secondary to weakness. Bilateral shoulder flexion, chest press, circular patterns, and elbow flexion/extension were performed. Red theraband exercise Ffor horizontal shoulder abduction, diagonal shoulder flexion, and elbow flexion/extension. Pt. Requires verbal cues, and visual demonstration.

## 2016-01-09 NOTE — Therapy (Signed)
[place] [facility] MAIN [facility] SERVICES [address] [place], [place], [address] Phone: [phone]   Fax:  [phone]  Physical Therapy Treatment  Patient Details  Name: Natasha [name] MRN: [id number] Date of Birth: [birth year] Referring Provider: Orene Desanctis  Encounter Date: 01/09/2016      PT End of Session - 01/09/16 1437    Visit Number 2   Number of Visits 25   Date for PT Re-Evaluation 03/26/16   PT Start Time 0230   PT Stop Time 0310   PT Time Calculation (min) 40 min   Equipment Utilized During Treatment Gait belt   Activity Tolerance Patient tolerated treatment well   Behavior During Therapy [facility] for tasks assessed/performed      Past Medical History:  Diagnosis Date  . Polysubstance abuse   . Restless leg syndrome unk    Past Surgical History:  Procedure Laterality Date  . CHOLECYSTECTOMY    . TUBAL LIGATION      There were no vitals filed for this visit.      Subjective Assessment - 01/09/16 1435    Subjective Patient has been able to walk inside her home without the loftstrand crutches and she uses the loftstrand crutches outside.    Pertinent History TBI   Patient Stated Goals to be able to walk without the loftstrand crutches   Pain Score 0-No pain   Multiple Pain Sites No      Therex:  TM walking 1. 2 miles / hour Lexicographer x 10 mintues Air balance beam and side stepping x 10 each way B LE standing hip SLR, abd, ext 2x10 with GTB LAQs with GTB 2x10 Mini squats 2x10 Heel raises 2x10 Wall squats 2x10  Cues for proper technique of exercises, slow eccentric contractions to target specific muscles and facility increased muscle building.                            PT Education - 01/09/16 1436    Education provided Yes   Education Details walkiing without AD and safety   Person(s) Educated Patient   Methods Explanation   Comprehension Verbalized understanding             PT Long Term Goals - 01/02/16 1432      PT LONG TERM GOAL #1   Title Patient will reduce timed up and go to <11 seconds to reduce fall risk and demonstrate improved transfer/gait ability.   Time 12   Period Weeks   Status New     PT LONG TERM GOAL #2   Title Patient will increase six minute walk test distance to >1000 for progression to community ambulator and improve gait ability   Time 12   Period Weeks   Status New     PT LONG TERM GOAL #3   Title Patient will increase 10 meter walk test to >1.75m/s as to improve gait speed for better community ambulation and to reduce fall    Time 12   Period Weeks   Status New     PT LONG TERM GOAL #4   Title Patient will tolerate 5 seconds of single leg stance without loss of balance to improve ability to get in and out of shower safely   Time 12   Period Weeks   Status New     PT LONG TERM GOAL #5   Title Patient (< 4 years old) will complete five times sit  to stand test in < 10 seconds indicating an increased LE strength and improved balance   Time 12   Period Weeks   Status New               Plan - 01/09/16 1437    Clinical Impression Statement Fatigue with sit to stand but demonstrating more control, Increase weight for standing exercises. Fatigue still evident with cross trainer and endurance   Rehab Potential Good   PT Frequency 2x / week   PT Duration 12 weeks   PT Treatment/Interventions Therapeutic activities;Therapeutic exercise;Balance training;Neuromuscular re-education;Stair training;Gait training;Patient/family education   PT Next Visit Plan strengthening and balance training; gait training   PT Home Exercise Plan balance in corner   Consulted and Agree with Plan of Care Patient      Patient will benefit from skilled therapeutic intervention in order to improve the following deficits and impairments:  Abnormal gait, Decreased balance, Decreased endurance, Decreased mobility, Difficulty walking, Decreased  cognition, Decreased safety awareness, Decreased strength, Impaired UE functional use, Obesity  Visit Diagnosis: Other lack of coordination  Muscle weakness (generalized)  Difficulty in walking, not elsewhere classified     Problem List There are no active problems to display for this patient.   [name], Macee Venables S 01/09/2016, 2:39 PM   [facility] [address] [place], [place], [address] Phone: [phone]   Fax:  [phone]  Name: Natasha [name] MRN: [id number] Date of Birth: 06/25/1985

## 2016-01-15 ENCOUNTER — Ambulatory Visit: Payer: Commercial Managed Care - HMO | Admitting: Physical Therapy

## 2016-01-15 ENCOUNTER — Ambulatory Visit: Payer: 59 | Admitting: Physical Therapy

## 2016-01-15 ENCOUNTER — Ambulatory Visit: Payer: Commercial Managed Care - HMO | Admitting: Occupational Therapy

## 2016-01-15 ENCOUNTER — Encounter: Payer: 59 | Admitting: Occupational Therapy

## 2016-01-15 ENCOUNTER — Encounter: Payer: Self-pay | Admitting: Physical Therapy

## 2016-01-15 DIAGNOSIS — R278 Other lack of coordination: Secondary | ICD-10-CM

## 2016-01-15 DIAGNOSIS — M6281 Muscle weakness (generalized): Secondary | ICD-10-CM
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
## 2016-01-15 NOTE — Patient Instructions (Signed)
OT TREATMENT    Neuro muscular re-education:  [facility]. performed [facility] tasks using the Grooved pegboard. Pt. worked on grasping the grooved pegs from a horizontal position, and moving the pegs to a vertical position in the hand to prepare for placing them in the grooved slot.   Therapeutic Exercise:  Pt. Worked on the [employer] for 8 min. With constant monitoring of the BUEs. Pt. Worked on changing, and alternating forward reverse position every 2 min. Rest breaks were required. Pt. performed 2# dowel ex. For UE strengthening secondary to weakness. Bilateral shoulder flexion, chest press, circular patterns, and elbow flexion/extension were performed.1-2 sets 20 reps. 2# dumbbell ex. for elbow flexion and extension,  forearm supination/pronation, wrist flexion/extension, and radial deviation. Pt. requires rest breaks and verbal cues for proper technique.

## 2016-01-15 NOTE — Therapy (Signed)
[facility] MAIN [facility] SERVICES [address] [place], [place], [address] Phone: [phone]   Fax:  [phone]  Physical Therapy Treatment  Patient Details  Name: Natasha [name] MRN: [id number] Date of Birth: February 28, 1986 Referring Provider: Orene Desanctis  Encounter Date: 01/15/2016      PT End of Session - 01/15/16 1307    Visit Number 3   Number of Visits 25   Date for PT Re-Evaluation 03/26/16   PT Start Time 0100   PT Stop Time 0145   PT Time Calculation (min) 45 min   Equipment Utilized During Treatment Gait belt   Activity Tolerance Patient tolerated treatment well   Behavior During Therapy [facility] for tasks assessed/performed      Past Medical History:  Diagnosis Date  . Polysubstance abuse   . Restless leg syndrome unk    Past Surgical History:  Procedure Laterality Date  . CHOLECYSTECTOMY    . TUBAL LIGATION      There were no vitals filed for this visit.      Subjective Assessment - 01/15/16 1306    Subjective Patient has been able to walk [place] without her loft strand crutches and used the grocery cart for support.   Pertinent History TBI   Patient Stated Goals to be able to walk without the loftstrand crutches      Therex: Seated LAQ: BLE 2.5lbs 2x10 Seated hamstring curl yellow band 2x10 BLE Supine march 2x10 with cues for increased hip flexion 2x10 Supine bridge with cues to stablilize pelvis 2x10 Supine SLR 4x5 BLE sidelying clamshell 3x5 BLE sidelying hip abduction with assist 3x5 BLE Sit to stand from mat table 3x5 with cues for hip abduction TM walking at 1. 5 miles/ hour x 10 mintues Octane cross trainer x 10 mintues Gym plates 15 x 3 flex and extension 6 plate for flex and 3 plate for extension Patient needs occasional verbal cueing to improve posture and cueing to correctly perform exercises slowly, holding at end of range to increase motor firing of desired muscle to encourage fatigue.                             PT Education - 01/15/16 1307    Education provided Yes   Education Details safety with steps   Person(s) Educated Patient   Methods Explanation   Comprehension Verbalized understanding             PT Long Term Goals - 01/02/16 1432      PT LONG TERM GOAL #1   Title Patient will reduce timed up and go to <11 seconds to reduce fall risk and demonstrate improved transfer/gait ability.   Time 12   Period Weeks   Status New     PT LONG TERM GOAL #2   Title Patient will increase six minute walk test distance to >1000 for progression to community ambulator and improve gait ability   Time 12   Period Weeks   Status New     PT LONG TERM GOAL #3   Title Patient will increase 10 meter walk test to >1.30m/s as to improve gait speed for better community ambulation and to reduce fall    Time 12   Period Weeks   Status New     PT LONG TERM GOAL #4   Title Patient will tolerate 5 seconds of single leg stance without loss of balance to improve ability  to get in and out of shower safely   Time 12   Period Weeks   Status New     PT LONG TERM GOAL #5   Title Patient (< 30 years old) will complete five times sit to stand test in < 10 seconds indicating an increased LE strength and improved balance   Time 12   Period Weeks   Status New               Plan - 01/15/16 1307    Clinical Impression Statement Instruction in use of adapted devices.Motor control of LE much improved. Muscle fatigue but no major pain complaints. Patient advancing to red theraband for exercises listed above.   Rehab Potential Good   PT Frequency 2x / week   PT Duration 12 weeks   PT Treatment/Interventions Therapeutic activities;Therapeutic exercise;Balance training;Neuromuscular re-education;Stair training;Gait training;Patient/family education   PT Next Visit Plan strengthening and balance training; gait training   PT Home Exercise Plan balance in  corner   Consulted and Agree with Plan of Care Patient      Patient will benefit from skilled therapeutic intervention in order to improve the following deficits and impairments:  Abnormal gait, Decreased balance, Decreased endurance, Decreased mobility, Difficulty walking, Decreased cognition, Decreased safety awareness, Decreased strength, Impaired UE functional use, Obesity  Visit Diagnosis: Muscle weakness (generalized)  Other lack of coordination     Problem List There are no active problems to display for this patient.  Ezekiel InaKristine S Liela Rylee, PT, DPT [place], [place] 01/15/2016, 1:09 PM  [place] [facility] MAIN [facility] SERVICES [address] [place], [place], [address] Phone: [phone]   Fax:  [phone]  Name: Natasha [name] MRN: [id number] Date of Birth: 05/23/1985

## 2016-01-17 ENCOUNTER — Encounter: Payer: 59 | Admitting: Occupational Therapy

## 2016-01-17 ENCOUNTER — Encounter: Payer: 59 | Admitting: Speech Pathology

## 2016-01-17 ENCOUNTER — Ambulatory Visit: Payer: 59 | Admitting: Physical Therapy

## 2016-01-22 ENCOUNTER — Ambulatory Visit: Payer: 59 | Admitting: Physical Therapy

## 2016-01-22 ENCOUNTER — Ambulatory Visit: Payer: Commercial Managed Care - HMO | Admitting: Occupational Therapy

## 2016-01-22 ENCOUNTER — Encounter: Payer: 59 | Admitting: Occupational Therapy

## 2016-01-22 DIAGNOSIS — M6281 Muscle weakness (generalized): Secondary | ICD-10-CM | POA: Diagnosis not present

## 2016-01-22 DIAGNOSIS — R278 Other lack of coordination: Secondary | ICD-10-CM

## 2016-01-22 NOTE — Patient Instructions (Signed)
OT TREATMENT    Selfcare:  Pt. Worked on IADL tasks deciphering various calendars. Pt. Required cues to identify missing items on calendars including days of the week, and the day number. Pt. Requires cues, and increased time to work through various calendars, as well as finding events on a complex schedule.
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
## 2016-01-24 ENCOUNTER — Encounter: Payer: Self-pay | Admitting: Physical Therapy

## 2016-01-24 ENCOUNTER — Ambulatory Visit: Payer: Commercial Managed Care - HMO | Admitting: Occupational Therapy

## 2016-01-24 ENCOUNTER — Encounter: Payer: 59 | Admitting: Occupational Therapy

## 2016-01-24 ENCOUNTER — Ambulatory Visit: Payer: Commercial Managed Care - HMO | Admitting: Physical Therapy

## 2016-01-24 ENCOUNTER — Ambulatory Visit: Payer: 59 | Admitting: Physical Therapy

## 2016-01-24 DIAGNOSIS — M6281 Muscle weakness (generalized): Secondary | ICD-10-CM | POA: Diagnosis not present

## 2016-01-24 DIAGNOSIS — R278 Other lack of coordination: Secondary | ICD-10-CM

## 2016-01-24 DIAGNOSIS — R262 Difficulty in walking, not elsewhere classified: Secondary | ICD-10-CM

## 2016-01-24 NOTE — Patient Instructions (Addendum)
OT TREATMENT    Therapeutic Exercise:  Pt. Worked on the [employer] for 8 min. With constant monitoring of the BUEs. Pt. Worked on changing, and alternating forward reverse position every 2 min. Rest breaks were required. Pt. performed 3.5# dowel ex. For UE strengthening secondary to weakness. Bilateral shoulder flexion, chest press, circular patterns, V pattern, and elbow flexion/extension were performed.  Selfcare:  Pt. Worked on problem-solving through monthly calendars. Pt. Worked on filling in blank calendars following written instructions. Pt. Presented with difficulty spacing the days of the week for the month, and added an additional column for the days of the week making 8 columns, for 7 days.

## 2016-01-24 NOTE — Therapy (Signed)
[place] MAIN [facility] SERVICES [address] [place], [place], [address] Phone: [phone]   Fax:  [phone]  Occupational Therapy Treatment  Patient Details  Name: Natasha [name] MRN: [id number] Date of Birth: [birth year] No Data Recorded  Encounter Date: 01/24/2016      OT End of Session - 01/24/16 1450    Visit Number 5   Number of Visits 20   Date for OT Re-Evaluation 03/14/16   Authorization Type 20 visits per calendar year   OT Start Time 1430   OT Stop Time 1515   OT Time Calculation (min) 45 min   Activity Tolerance Patient tolerated treatment well   Behavior During Therapy [facility] for tasks assessed/performed      Past Medical History:  Diagnosis Date  . Polysubstance abuse   . Restless leg syndrome unk    Past Surgical History:  Procedure Laterality Date  . CHOLECYSTECTOMY    . TUBAL LIGATION      There were no vitals filed for this visit.      Subjective Assessment - 01/24/16 1436    Subjective  Pt. reports having a court date tomorrow.   Patient is accompained by: Family member   Currently in Pain? No/denies        OT TREATMENT    Therapeutic Exercise:  Pt. Worked on the [employer] for 8 min. With constant monitoring of the BUEs. Pt. Worked on changing, and alternating forward reverse position every 2 min. Rest breaks were required. Pt. performed 3.5# dowel ex. For UE strengthening secondary to weakness. Bilateral shoulder flexion, chest press, circular patterns, V pattern, and elbow flexion/extension were performed.  Selfcare:  Pt. Worked on problem-solving through monthly calendars. Pt. Worked on filling in blank calendars following written instructions. Pt. Presented with difficulty spacing the days of the week for the month, and added an additional column for the days of the week making 8 columns, for 7 days.                           OT Education - 01/24/16 1505    Education  provided Yes   Education Details Ther. ex, and calendars.   Person(s) Educated Patient   Methods Explanation   Comprehension Verbalized understanding             OT Long Term Goals - 01/02/16 1319      OT LONG TERM GOAL #1   Title Patient will improve L UE strength by 1 mm grade to complete IADL tasks with modified independence.     Baseline LUE shoulder strength 4/5, elbow flexion, extension 4+/5, wrist 4=/5   Status Revised     OT LONG TERM GOAL #2   Title Patient will complete laundry tasks with supervision.   Time 12   Period Weeks   Status Achieved     OT LONG TERM GOAL #3   Title Patient will pour tea from a pitcher without spillage   Time 12   Period Weeks   Status Achieved     OT LONG TERM GOAL #4   Title Patient will demonstrate the ability to transport snack items from the kitchen to the den without spilling items.    Time 12   Period Weeks   Status Achieved     OT LONG TERM GOAL #5   Title Patient will demonstrate the ability to choose her own clothing each day, matching 100% of the time.  Baseline mom has to perform.   Time 12   Period Weeks   Status Achieved     Long Term Additional Goals   Additional Long Term Goals Yes     OT LONG TERM GOAL #6   Title Pt. will improve right [facility] skills needed for preparing ingredients for meal preparation.   Baseline Pt. has difficulty opening snack packets and container lids   Time 12   Period Weeks   Status New     OT LONG TERM GOAL #7   Title Pt. will independently plan meals and menus for one week in preparation for creating a weekly shopping list.   Baseline Pt. currently has difficulty with meal planning, and creating shopping lists.   Status Partially Met     OT LONG TERM GOAL #8   Title Pt. will require supervision vacuuming while using compensatory strategies.   Baseline Pt. is unable to vacuuming   Time 12   Period Weeks   Status Achieved     OT LONG TERM GOAL  #9   Baseline Pt. will  independently demonstrate organization and placement of items in the refrigerator for easier access.   Time 12   Period Weeks   Status Achieved     OT LONG TERM GOAL  #10   TITLE Pt. will independently calculate, manage, and simulate monthly bill management.      Baseline unable    Time 12   Period Weeks   Status Revised     OT LONG TERM GOAL  #11   TITLE Pt. will independently be able to put jewelry including fastening clasps.   Baseline difficulty   Time 12   Period Weeks   Status Achieved     OT LONG TERM GOAL  #12   TITLE Pt. will prepare and cook a complex, multiple step meal with supervision.    Baseline Pt. is able to cook a simple one step meal with S-CGA.   Time 12   Period Weeks   Status On-going     OT LONG TERM GOAL  #13   TITLE Pt. will independently decipher, and fill out various types of complex schedules with 100% accuracy.   Baseline Pt. able to fill out simple schedules   Time 12   Period Weeks   Status New               Plan - 01/24/16 1450    Clinical Impression Statement Pt. requires cues for filling out a blank month long calendar following written instructions. Pt. added additional columns for the days of the week, and required cues for spacing. Pt. continues to require cues and assist for corrections on the calendar. Pt. continues to benefit from skilled OT intervention to improve ADL and IADL functioning, and to be able to manage tasks needed for imore independent living.   Rehab Potential Good   OT Frequency 2x / week   OT Duration 12 weeks   OT Treatment/Interventions Self-care/ADL training;Therapeutic exercise;Moist Heat;Neuromuscular education;DME and/or AE instruction;Therapeutic activities;Patient/family education;Cognitive remediation/compensation   Consulted and Agree with Plan of Care Patient      Patient will benefit from skilled therapeutic intervention in order to improve the following deficits and impairments:  Decreased  cognition, Decreased knowledge of use of DME, Decreased coordination, Decreased mobility, Decreased endurance, Decreased strength, Decreased balance, Decreased safety awareness, Decreased knowledge of precautions, Impaired UE functional use  Visit Diagnosis: Muscle weakness (generalized)  Other lack of coordination    Problem List  There are no active problems to display for this patient.   [name], MS, OTR/L 01/24/2016, 3:22 PM  [place] MAIN [facility] SERVICES [address] [place], [place], [address] Phone: [phone]   Fax:  [phone]  Name: Natasha [name] MRN: [id number] Date of Birth: July 18, 1985

## 2016-01-24 NOTE — Therapy (Signed)
[place] [facility] MAIN [facility] SERVICES [address] [place], [place], [address] Phone: [phone]   Fax:  [phone]  Physical Therapy Treatment  Patient Details  Name: Natasha [name] MRN: [id number] Date of Birth: Sep 05, 1985 Referring Provider: Orene Desanctis  Encounter Date: 01/24/2016      PT End of Session - 01/24/16 1503    Visit Number 4   Number of Visits 25   Date for PT Re-Evaluation 03/26/16   PT Start Time 0145   PT Stop Time 0230   PT Time Calculation (min) 45 min   Equipment Utilized During Treatment Gait belt   Activity Tolerance Patient tolerated treatment well   Behavior During Therapy [facility] for tasks assessed/performed      Past Medical History:  Diagnosis Date  . Polysubstance abuse   . Restless leg syndrome unk    Past Surgical History:  Procedure Laterality Date  . CHOLECYSTECTOMY    . TUBAL LIGATION      There were no vitals filed for this visit.      Subjective Assessment - 01/24/16 1503    Subjective Patient has been able to walk [place] without her loft strand crutches and used the grocery cart for support.   Pertinent History TBI   Patient Stated Goals to be able to walk without the loftstrand crutches         Therapeutic exercise: TM x 2. 0 for 10 mins without rest periods. Cross trainer x 10 minutes without rest periods, level  10  Machine knee extension with 2 plate x 20 x 3 Machine knee flex with 7 plates x 20 x 3 Leg press with 7 plates 20 x 3, heel raises x 20 x 3  Patient needs cuing for posture and correct technique                         PT Education - 01/24/16 1503    Education provided Yes   Education Details safety with steps   Person(s) Educated Patient   Methods Explanation   Comprehension Verbalized understanding             PT Long Term Goals - 01/02/16 1432      PT LONG TERM GOAL #1   Title Patient will reduce timed up and go to <11  seconds to reduce fall risk and demonstrate improved transfer/gait ability.   Time 12   Period Weeks   Status New     PT LONG TERM GOAL #2   Title Patient will increase six minute walk test distance to >1000 for progression to community ambulator and improve gait ability   Time 12   Period Weeks   Status New     PT LONG TERM GOAL #3   Title Patient will increase 10 meter walk test to >1.79m/s as to improve gait speed for better community ambulation and to reduce fall    Time 12   Period Weeks   Status New     PT LONG TERM GOAL #4   Title Patient will tolerate 5 seconds of single leg stance without loss of balance to improve ability to get in and out of shower safely   Time 12   Period Weeks   Status New     PT LONG TERM GOAL #5   Title Patient (< 75 years old) will complete five times sit to stand test in < 10 seconds indicating an increased LE strength  and improved balance   Time 12   Period Weeks   Status New               Plan - 01/24/16 1503    Clinical Impression Statement Fatigue with sit to stand but demonstrating more control, Increase weight for standing exercises. Fatigue still evident with cross trainer and endurance.    Rehab Potential Good   PT Frequency 2x / week   PT Duration 12 weeks   PT Treatment/Interventions Therapeutic activities;Therapeutic exercise;Balance training;Neuromuscular re-education;Stair training;Gait training;Patient/family education   PT Next Visit Plan strengthening and balance training; gait training   PT Home Exercise Plan balance in corner   Consulted and Agree with Plan of Care Patient      Patient will benefit from skilled therapeutic intervention in order to improve the following deficits and impairments:  Abnormal gait, Decreased balance, Decreased endurance, Decreased mobility, Difficulty walking, Decreased cognition, Decreased safety awareness, Decreased strength, Impaired UE functional use, Obesity  Visit  Diagnosis: Muscle weakness (generalized)  Difficulty in walking, not elsewhere classified     Problem List There are no active problems to display for this patient.  Ezekiel InaKristine S Lyn Deemer, PT, DPT [place], [place] BrunsKristine S 01/24/2016, 3:05 PM  [place] [facility] [address] [place], [place], [address] Phone: [phone]   Fax:  [phone]  Name: Natasha [name] MRN: [id number] Date of Birth: 03/13/1986

## 2016-01-29 ENCOUNTER — Ambulatory Visit: Payer: Commercial Managed Care - HMO | Admitting: Occupational Therapy

## 2016-01-29 ENCOUNTER — Encounter: Payer: Self-pay | Admitting: Physical Therapy

## 2016-01-29 ENCOUNTER — Ambulatory Visit: Payer: Commercial Managed Care - HMO | Attending: Pediatrics | Admitting: Physical Therapy

## 2016-01-29 ENCOUNTER — Ambulatory Visit: Payer: 59 | Admitting: Physical Therapy

## 2016-01-29 ENCOUNTER — Encounter: Payer: 59 | Admitting: Occupational Therapy

## 2016-01-29 DIAGNOSIS — M6281 Muscle weakness (generalized): Secondary | ICD-10-CM

## 2016-01-29 DIAGNOSIS — R41841 Cognitive communication deficit: Secondary | ICD-10-CM | POA: Diagnosis present

## 2016-01-29 DIAGNOSIS — R531 Weakness: Secondary | ICD-10-CM | POA: Insufficient documentation

## 2016-01-29 DIAGNOSIS — R262 Difficulty in walking, not elsewhere classified: Secondary | ICD-10-CM

## 2016-01-29 DIAGNOSIS — R278 Other lack of coordination: Secondary | ICD-10-CM

## 2016-01-29 NOTE — Patient Instructions (Addendum)
OT TREATMENT    Therapeutic Exercise:  Pt. Worked on the [employer] for 8 min. With constant monitoring of the BUEs. Pt. Worked on changing, and alternating forward reverse position every 2 min. Rest breaks were required.  Cues for hand placement on the handles. 3# dumbbell ex. for elbow flexion and extension, forearm supination/pronation, wrist flexion/extension, and radial deviation. Pt. requires rest breaks and verbal cues for proper technique.  Selfcare:  Pt. Worked on [employer]navigating food labels, and menus. Pt. requires cues for locating items, and catagories on the food label.
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
## 2016-01-29 NOTE — Therapy (Signed)
[place] [facility] [address] [place], [place], [address] Phone: [phone]   Fax:  [phone]  Physical Therapy Evaluation  Patient Details  Name: Natasha [name] MRN: [id number] Date of Birth: 11/15/1985 Referring Provider: Orene DesanctisBEHLING, KAREN  Encounter Date: 01/29/2016      PT End of Session - 01/29/16 1320    Visit Number 5   Number of Visits 25   Date for PT Re-Evaluation 03/26/16   PT Start Time 0110   PT Stop Time 0150   PT Time Calculation (min) 40 min   Equipment Utilized During Treatment Gait belt   Activity Tolerance Patient tolerated treatment well   Behavior [facility] Therapy [facility] for tasks assessed/performed      Past Medical History:  Diagnosis Date  . Polysubstance abuse   . Restless leg syndrome unk    Past Surgical History:  Procedure Laterality Date  . CHOLECYSTECTOMY    . TUBAL LIGATION      There were no vitals filed for this visit.       Subjective Assessment - 01/29/16 1318    Subjective Patient is using loft strand crutches and is using them outdoors and uses furniture for indoor waking .    Pertinent History TBI   Patient Stated Goals to be able to walk without the loftstrand crutches   Currently in Pain? No/denies   Pain Score 0-No pain   Multiple Pain Sites No        Therex: TM walking side stepping . 4 m/sec left and right Standing march 2x10 Fwd/retro walking with 1UE x 3 laps Side steps x 3 laps   Standing balance no UE 5x30s Standing hip abd 2x10 Standing hip ext 2x10 Standing ankle PF/ PF 2x10 LAQ 2x10 Pt requires min verbal and tactile cues for proper exercise performance                        PT Education - 01/29/16 1319    Education provided Yes   Education Details HEP    Person(s) Educated Patient   Methods Explanation   Comprehension Verbalized understanding             PT Long Term Goals - 01/02/16 1432      PT LONG TERM GOAL #1    Title Patient will reduce timed up and go to <11 seconds to reduce fall risk and demonstrate improved transfer/gait ability.   Time 12   Period Weeks   Status New     PT LONG TERM GOAL #2   Title Patient will increase six minute walk test distance to >1000 for progression to community ambulator and improve gait ability   Time 12   Period Weeks   Status New     PT LONG TERM GOAL #3   Title Patient will increase 10 meter walk test to >1.4422m/s as to improve gait speed for better community ambulation and to reduce fall    Time 12   Period Weeks   Status New     PT LONG TERM GOAL #4   Title Patient will tolerate 5 seconds of single leg stance without loss of balance to improve ability to get in and out of shower safely   Time 12   Period Weeks   Status New     PT LONG TERM GOAL #5   Title Patient (< 30 years old) will complete five times sit to stand test in < 10  seconds indicating an increased LE strength and improved balance   Time 12   Period Weeks   Status New               Plan - 01/29/16 1321    Clinical Impression Statement moderate verbal instruction to improve set up, proper use of LE, and improved posture and gait mechanics. Patient responded moderately to instruction   Rehab Potential Good   PT Frequency 2x / week   PT Duration 12 weeks   PT Treatment/Interventions Therapeutic activities;Therapeutic exercise;Balance training;Neuromuscular re-education;Stair training;Gait training;Patient/family education   PT Next Visit Plan strengthening and balance training; gait training   PT Home Exercise Plan balance in corner   Consulted and Agree with Plan of Care Patient      Patient will benefit from skilled therapeutic intervention in order to improve the following deficits and impairments:  Abnormal gait, Decreased balance, Decreased endurance, Decreased mobility, Difficulty walking, Decreased cognition, Decreased safety awareness, Decreased strength, Impaired UE  functional use, Obesity  Visit Diagnosis: Muscle weakness (generalized)  Other lack of coordination  Difficulty in walking, not elsewhere classified     Problem List There are no active problems to display for this patient.  [name], PT, DPT [place], [place] S 01/29/2016, 1:22 PM  [facility] MAIN [facility] SERVICES [address] [place], [place], [address] Phone: [phone]   Fax:  [phone]  Name: Natasha [name] MRN: [id number] Date of Birth: [birth year]

## 2016-01-31 ENCOUNTER — Ambulatory Visit: Payer: 59 | Admitting: Physical Therapy

## 2016-01-31 ENCOUNTER — Ambulatory Visit: Payer: Commercial Managed Care - HMO | Admitting: Speech Pathology

## 2016-01-31 ENCOUNTER — Encounter: Payer: Self-pay | Admitting: Physical Therapy

## 2016-01-31 ENCOUNTER — Ambulatory Visit: Payer: Commercial Managed Care - HMO | Admitting: Occupational Therapy

## 2016-01-31 ENCOUNTER — Ambulatory Visit: Payer: Commercial Managed Care - HMO | Admitting: Physical Therapy

## 2016-01-31 ENCOUNTER — Encounter: Payer: 59 | Admitting: Occupational Therapy

## 2016-01-31 DIAGNOSIS — M6281 Muscle weakness (generalized): Secondary | ICD-10-CM | POA: Diagnosis not present

## 2016-01-31 DIAGNOSIS — R278 Other lack of coordination: Secondary | ICD-10-CM

## 2016-01-31 DIAGNOSIS — R262 Difficulty in walking, not elsewhere classified: Secondary | ICD-10-CM

## 2016-01-31 DIAGNOSIS — R41841 Cognitive communication deficit: Secondary | ICD-10-CM

## 2016-01-31 NOTE — Patient Instructions (Addendum)
OT TREATMENT    Neuro muscular re-education:  Pt. Worked on  Grasping 1" resistive cubes on a vertical angle alternating with her 2nd through 5th digits for [facility] skills to encourage wrist extension.   Therapeutic Exercise:  Pt. performed 3.5# dowel ex. For UE strengthening secondary to weakness. Bilateral shoulder flexion, chest press, circular patterns, and elbow flexion/extension were performed.  Selfcare:  Pt. Worked on making a meal plan for breakfast, lunch, snack, and dinner for one day using a food pyramid to assist. Pt. then created a shopping list for the ingredients/items. Pt. required cues for ingredients, and meal items.

## 2016-01-31 NOTE — Therapy (Signed)
[place] [facility] MAIN [facility] SERVICES [address] [place], [place], [address] Phone: [phone]   Fax:  [phone]  Physical Therapy Treatment  Patient Details  Name: Natasha [name] MRN: [id number] Date of Birth: 1986-04-05 Referring Provider: Orene Desanctis  Encounter Date: 01/31/2016      PT End of Session - 01/31/16 1528    Visit Number 6   Number of Visits 25   Date for PT Re-Evaluation 03/26/16   PT Start Time 0315   PT Stop Time 0400   PT Time Calculation (min) 45 min   Equipment Utilized During Treatment Gait belt   Activity Tolerance Patient tolerated treatment well   Behavior During Therapy [facility] for tasks assessed/performed      Past Medical History:  Diagnosis Date  . Polysubstance abuse   . Restless leg syndrome unk    Past Surgical History:  Procedure Laterality Date  . CHOLECYSTECTOMY    . TUBAL LIGATION      There were no vitals filed for this visit.      Subjective Assessment - 01/31/16 1527    Subjective Patient is able to ambulate indoors wihtout AD with decreased gait speed and unsteady gait.    Pertinent History TBI   Patient Stated Goals to be able to walk without the loftstrand crutches   Currently in Pain? No/denies   Pain Score 0-No pain        NMR  staggered stance anterior/posterior weight shifting on small rockerboard; CGA, increased difficulty and more unsteadiness with RLE fwd; min cues to increase weight shift over RLE  NBOS stance on airex pad with no UE and EC, 5x15 sec  Leg press plate 5 20 x 3 GAIT TRAINING gait training on treadmill x 5 min with "gait training" setting 1. , BUE support ; min-mod verbal cues to increase  step length; CGA gait training in hallway no AD 19ftx2; min verbal cues to increase step length  Cross trainer octane x 10 mins level 10 CGA and Min to mod verbal cues used throughout with increased in postural sway and LOB most seen with narrow base of support and  while on uneven surfaces. Continues to have balance deficits typical with diagnosis. Patient performs intermediate level exercises without pain behaviors and needs verbal cuing for postural alignment                       PT Education - 01/31/16 1528    Education provided Yes   Education Details safety with steps   Person(s) Educated Patient   Methods Explanation   Comprehension Verbalized understanding             PT Long Term Goals - 01/02/16 1432      PT LONG TERM GOAL #1   Title Patient will reduce timed up and go to <11 seconds to reduce fall risk and demonstrate improved transfer/gait ability.   Time 12   Period Weeks   Status New     PT LONG TERM GOAL #2   Title Patient will increase six minute walk test distance to >1000 for progression to community ambulator and improve gait ability   Time 12   Period Weeks   Status New     PT LONG TERM GOAL #3   Title Patient will increase 10 meter walk test to >1.33m/s as to improve gait speed for better community ambulation and to reduce fall    Time 12   Period Weeks  Status New     PT LONG TERM GOAL #4   Title Patient will tolerate 5 seconds of single leg stance without loss of balance to improve ability to get in and out of shower safely   Time 12   Period Weeks   Status New     PT LONG TERM GOAL #5   Title Patient (< 30 years old) will complete five times sit to stand test in < 10 seconds indicating an increased LE strength and improved balance   Time 12   Period Weeks   Status New               Plan - 01/31/16 1528    Clinical Impression Statement Cueing was needed during the leg press to control motion out/back; frequent instances of decreased eccentric control were exhibited. Min cueing needed during hip 3-way to maintain upright posture with knees straight. Pt had good performance of therapeutic exercise today.   Rehab Potential Good   PT Frequency 2x / week   PT Duration 12 weeks    PT Treatment/Interventions Therapeutic activities;Therapeutic exercise;Balance training;Neuromuscular re-education;Stair training;Gait training;Patient/family education   PT Next Visit Plan strengthening and balance training; gait training   PT Home Exercise Plan balance in corner   Consulted and Agree with Plan of Care Patient      Patient will benefit from skilled therapeutic intervention in order to improve the following deficits and impairments:  Abnormal gait, Decreased balance, Decreased endurance, Decreased mobility, Difficulty walking, Decreased cognition, Decreased safety awareness, Decreased strength, Impaired UE functional use, Obesity  Visit Diagnosis: Muscle weakness (generalized)  Other lack of coordination  Difficulty in walking, not elsewhere classified     Problem List There are no active problems to display for this patient.   [name], [name] 01/31/2016, 3:30 PM  [facility] MAIN [facility] [address] [place], [place], [address] Phone: [phone]   Fax:  [phone]  Name: Natasha [name] MRN: [id number] Date of Birth: 12/26/1985
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
## 2016-02-01 ENCOUNTER — Encounter: Payer: Self-pay | Admitting: Speech Pathology

## 2016-02-01 NOTE — Therapy (Signed)
[place] [facility] [address] , [place], [address] Phone: [phone]   Fax:  [phone]  Speech Language Pathology Evaluation  Patient Details  Name: Natasha ShadeWhitney B [name] MRN: [id number] Date of Birth: 09/16/1985 Referring Provider: Orene DesanctisBEHLING, KAREN  Encounter Date: 01/31/2016      End of Session - 02/01/16 1419    Visit Number 1   Number of Visits 20   Date for SLP Re-Evaluation 04/11/16   SLP Start Time 1600   SLP Stop Time  1700   SLP Time Calculation (min) 60 min      Past Medical History:  Diagnosis Date  . Polysubstance abuse   . Restless leg syndrome unk    Past Surgical History:  Procedure Laterality Date  . CHOLECYSTECTOMY    . TUBAL LIGATION      There were no vitals filed for this visit.          SLP Evaluation [facility] - 02/01/16 0001      SLP Visit Information   SLP Received On 01/31/16   Referring Provider Orene DesanctisBEHLING, KAREN   Onset Date 10/21/2014   Medical Diagnosis TBI     Subjective   Subjective The patient states she is well and eager to resume her therapies.   Patient/Family Stated Goal "get better", with cues to expand her response, the patient's goals include ability to live on her own and to be employed     Pain Assessment   Currently in Pain? No/denies     Prior Functional Status   Cognitive/Linguistic Baseline Within functional limits     Oral Motor/Sensory Function   Overall Oral Motor/Sensory Function Impaired   Overall Oral Motor/Sensory Function minimal residual dysarthria     Motor Speech   Overall Motor Speech Impaired  Minimal residual dysarthria     Standardized Assessments   Standardized Assessments  Cognitive Linguistic Quick Test;Western Aphasia Battery revised  Reading / Writing portions of WAB-R      Western Aphasia Battery- Revised  Reading 60/60 Writing  50/50  Cognitive Linguistic Quick Test The Cognitive Linguistic Quick Test (CLQT) was  administered to assess the relative status of five cognitive domains: attention, memory, language, executive functioning, and visuospatial skills. Scores from 10 tasks were used to estimate severity ratings (for age groups 18-69 years and 70-89 years) for each domain, a clock drawing task, as well as an overall composite severity rating of cognition.   Task    Score  Criterion Cut Scores Personal Facts  8/8   8  Symbol Cancellation   12/12   11 Confrontation Naming   10/10   10 Clock Drawing    11/13   12 Story Retelling     9/10   6 Symbol Trails     10/10   9 Generative Naming     5/9   5 Design Memory  6/6   5 Mazes        4/8   7 Design Generation    3/13   6 Cognitive Domain  Severity Rating Attention   WNL  Memory   WNL Executive Function  Mild Language   WNL Visuospatial Skills  WNL Clock Drawing   Mild Composite Severity Rating WNL         SLP Education - 02/01/16 1418    Education provided Yes   Education Details Discuss goals of speech therapy   Person(s) Educated Patient   Methods Explanation   Comprehension Verbalized understanding  SLP Long Term Goals - 02/01/16 1425      SLP LONG TERM GOAL #1   Title Patient will demonstrate functional cognitive-communication skills for independent completion of personal responsibilities.   Time 10   Period Weeks   Status New     SLP LONG TERM GOAL #2   Title Patient will independently complete complex executive function skills tasks with 80% accuracy.   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  The results of the Cognitive Linguistic Quick Test (CLQT) indicate a composite severity rating of WNL.  The patient demonstrates mild deficit in the domains of executive function and clock drawing. The patient demonstrated WNL function in the domains of  attention, memory, language, and visuospatial skills.  The patient is living with her mother, remains unemployed, and is not independent for high level activities of daily living.  The patient will benefit from skilled speech therapy for restorative and compensatory treatment of cognitive-communication deficit, with primary focus on executive skills to promote independence and readiness for employment.   Speech Therapy Frequency 2x / week   Duration Other (comment)  10 weeks   Treatment/Interventions Compensatory techniques;Cognitive reorganization;Internal/external aids;Functional tasks;SLP instruction and feedback;Compensatory strategies;Patient/family education   Potential to Achieve Goals Good   Potential Considerations Ability to learn/carryover information;Cooperation/participation level;Previous level of function;Severity of impairments;Family/community support   SLP Home Exercise Plan identify barriers for ADLs and independence   Consulted and Agree with Plan of Care Patient      Patient will benefit from skilled therapeutic intervention in order to improve the following deficits and impairments:   Cognitive communication deficit - Plan: SLP plan of care cert/re-cert    Problem List There are no active problems to display for this patient.  Dollene Primrose, MS/CCC- SLP  Leandrew Koyanagi 02/01/2016, 2:29 PM  [place] [facility] MAIN [facility] SERVICES [address] [place], [place], [address] Phone: [phone]   Fax:  [phone]  Name: Natasha [name] MRN: [id number] Date of Birth: 1986/04/19

## 2016-02-05 ENCOUNTER — Encounter: Payer: 59 | Admitting: Occupational Therapy

## 2016-02-05 ENCOUNTER — Ambulatory Visit: Payer: Commercial Managed Care - HMO | Admitting: Speech Pathology

## 2016-02-05 ENCOUNTER — Ambulatory Visit: Payer: Commercial Managed Care - HMO | Admitting: Physical Therapy

## 2016-02-05 ENCOUNTER — Encounter: Payer: 59 | Admitting: Speech Pathology

## 2016-02-05 ENCOUNTER — Ambulatory Visit: Payer: 59 | Admitting: Physical Therapy

## 2016-02-05 ENCOUNTER — Encounter: Payer: Self-pay | Admitting: Physical Therapy

## 2016-02-05 ENCOUNTER — Encounter: Payer: Self-pay | Admitting: Occupational Therapy

## 2016-02-05 ENCOUNTER — Ambulatory Visit: Payer: Commercial Managed Care - HMO | Admitting: Occupational Therapy

## 2016-02-05 VITALS — BP 130/82 | HR 81

## 2016-02-05 DIAGNOSIS — M6281 Muscle weakness (generalized): Secondary | ICD-10-CM

## 2016-02-05 DIAGNOSIS — R41841 Cognitive communication deficit: Secondary | ICD-10-CM

## 2016-02-05 DIAGNOSIS — R278 Other lack of coordination: Secondary | ICD-10-CM

## 2016-02-05 DIAGNOSIS — R262 Difficulty in walking, not elsewhere classified: Secondary | ICD-10-CM

## 2016-02-05 NOTE — Therapy (Signed)
[place] [facility] [address] [place], [place], [address] Phone: [phone]   Fax:  [phone]  Physical Therapy Treatment  Patient Details  Name: Natasha ShadeWhitney B [name] MRN: [id number] Date of Birth: 05/05/1985 Referring Provider: Orene DesanctisBEHLING, KAREN  Encounter Date: 02/05/2016      PT End of Session - 02/05/16 1436    Visit Number 7   Number of Visits 25   Date for PT Re-Evaluation 03/26/16   PT Start Time 1415   PT Stop Time 1500   PT Time Calculation (min) 45 min   Equipment Utilized During Treatment Gait belt   Activity Tolerance Patient tolerated treatment well   Behavior [facility] for tasks assessed/performed      Past Medical History:  Diagnosis Date  . Polysubstance abuse   . Restless leg syndrome unk    Past Surgical History:  Procedure Laterality Date  . CHOLECYSTECTOMY    . TUBAL LIGATION      Vitals:   02/05/16 1421  BP: 130/82  Pulse: 81  SpO2: 100%        Subjective Assessment - 02/05/16 1421    Subjective Pt reports having a headache as she went to the dentist last week and had a tooth filled.  Otherwise no complaints or concerns.  Has continued to ambulate in home without AD and denies any falls or LOB.     Currently in Pain? Yes   Pain Score 4    Pain Location Head   Pain Descriptors / Indicators Aching   Pain Type Acute pain       TREATMENT   Therapeutic Exercise:  Walking in hallway with vertical and horizontal head turns, sudden stops, backward walking x6 minutes  Bil leg press 120#, 3x10 with cues for eccentric lowering and to avoid knee hyperextension  Sit<>stand without use of UEs and cues for eccentric lowering in // bars, 2x10   Neuromuscular Rehab:  Sideways walking on airex x4 lengths in // bars in each direction  Tandem walking on airex x4 lengths in // bars  Alternating toe taps from airex pad up to 8" step, 3x10  Stepping over hurdle and  foam roll in // bars x6  lengths in each direction  Sidestepping over hurdle x2 minutes. Pt demonstrates poor coordination and cues provided for soft landing with step.                            PT Education - 02/05/16 1433    Education provided Yes   Education Details Exercies technique   Person(s) Educated Patient   Methods Explanation;Demonstration;Verbal cues   Comprehension Verbalized understanding;Returned demonstration             PT Long Term Goals - 01/02/16 1432      PT LONG TERM GOAL #1   Title Patient will reduce timed up and go to <11 seconds to reduce fall risk and demonstrate improved transfer/gait ability.   Time 12   Period Weeks   Status New     PT LONG TERM GOAL #2   Title Patient will increase six minute walk test distance to >1000 for progression to community ambulator and improve gait ability   Time 12   Period Weeks   Status New     PT LONG TERM GOAL #3   Title Patient will increase 10 meter walk test to >1.3866m/s as to improve gait speed for better community ambulation  and to reduce fall    Time 12   Period Weeks   Status New     PT LONG TERM GOAL #4   Title Patient will tolerate 5 seconds of single leg stance without loss of balance to improve ability to get in and out of shower safely   Time 12   Period Weeks   Status New     PT LONG TERM GOAL #5   Title Patient (< 40 years old) will complete five times sit to stand test in < 10 seconds indicating an increased LE strength and improved balance   Time 12   Period Weeks   Status New               Plan - 02/05/16 1442    Clinical Impression Statement Cues for eccentric lowering and correct exercise technique with strengthening exercises today.  She tolerated all interventions well this date.  Pt will benefit from continued skilled PT for strengthening, balance, and gait training interventions.     Rehab Potential Good   PT Frequency 2x / week   PT Duration 12 weeks   PT  Treatment/Interventions Therapeutic activities;Therapeutic exercise;Balance training;Neuromuscular re-education;Stair training;Gait training;Patient/family education   PT Next Visit Plan strengthening and balance training; gait training   PT Home Exercise Plan balance in corner   Consulted and Agree with Plan of Care Patient      Patient will benefit from skilled therapeutic intervention in order to improve the following deficits and impairments:  Abnormal gait, Decreased balance, Decreased endurance, Decreased mobility, Difficulty walking, Decreased cognition, Decreased safety awareness, Decreased strength, Impaired UE functional use, Obesity  Visit Diagnosis: Muscle weakness (generalized)  Other lack of coordination  Difficulty in walking, not elsewhere classified     Problem List There are no active problems to display for this patient.   Encarnacion Chu PT, DPT 02/05/2016, 3:03 PM  [facility] MAIN [facility] SERVICES [address] [place], [place], [address] Phone: [phone]   Fax:  [phone]  Name: Natasha [name] MRN: [id number] Date of Birth: [birth year]

## 2016-02-06 ENCOUNTER — Encounter: Payer: Self-pay | Admitting: Speech Pathology

## 2016-02-06 NOTE — Therapy (Signed)
[facility] MAIN [facility] SERVICES [address] [place], [place], [address] Phone: [phone]   Fax:  [phone]  Speech Language Pathology Treatment  Patient Details  Name: Hector ShadeWhitney B Jacot MRN: [id number] Date of Birth: 09/14/1985 Referring Provider: Orene DesanctisBEHLING, KAREN  Encounter Date: 02/05/2016      End of Session - 02/06/16 1454    Visit Number 2   Number of Visits 20   Date for SLP Re-Evaluation 04/11/16   SLP Start Time 1600   SLP Stop Time  1700   SLP Time Calculation (min) 60 min      Past Medical History:  Diagnosis Date  . Polysubstance abuse   . Restless leg syndrome unk    Past Surgical History:  Procedure Laterality Date  . CHOLECYSTECTOMY    . TUBAL LIGATION      There were no vitals filed for this visit.      Subjective Assessment - 02/06/16 1453    Subjective The patient reports that she is independently arranging her transportation to medical appointments   Currently in Pain? No/denies               ADULT SLP TREATMENT - 02/06/16 0001      General Information   Behavior/Cognition Alert;Cooperative;Pleasant mood   HPI TBI     Treatment Provided   Treatment provided Cognitive-Linquistic     Pain Assessment   Pain Assessment No/denies pain     Cognitive-Linquistic Treatment   Treatment focused on Cognition;Patient/family/caregiver education   Skilled Treatment Assessment of cognitive impairment on work skills:  the patient was given portions of the evaluation protocol in the Cognitive Rehabilitation Workbook.  In the first part, computations, the patient followed 4/7 written directions, demonstrated no episodes of distraction, and got 85% of problems correct (2 errors, 2 omissions).  The patient had forgotten how to do percentages but asked for help and was able to retain the information to complete the rest of percentage problems.  The patient required multiple cues to return to the written  directions to insure she had followed all the directions.  The patient required min cue to understand that she was to skip Part II and complete Part III.  The patient was able to list 5 legitimate skills/strengths related to work.  The patient achieved 50% accuracy on reading a train schedule activity.       Assessment / Recommendations / Plan   Plan Continue with current plan of care     Progression Toward Goals   Progression toward goals Progressing toward goals          SLP Education - 02/06/16 1453    Education provided Yes   Education Details Improvements in cognitive skills since she was last in therapy with me   Person(s) Educated Patient   Methods Explanation   Comprehension Verbalized understanding            SLP Long Term Goals - 02/01/16 1425      SLP LONG TERM GOAL #1   Title Patient will demonstrate functional cognitive-communication skills for independent completion of personal responsibilities.   Time 10   Period Weeks   Status New     SLP LONG TERM GOAL #2   Title Patient will independently complete complex executive function skills tasks with 80% accuracy.   Time 10   Period Weeks   Status New          Plan - 02/06/16 1455    Clinical Impression Statement  The patient demonstrates significant improvement in diligence and sustained attention.  She is more willing to ask questions for clarifying tasks she doesn't understand.  She was able to retain information to use within the activity, which reflects improvement as well.  The patient has significant difficulty using charted information.   Speech Therapy Frequency 2x / week   Duration Other (comment)   Treatment/Interventions Compensatory techniques;Cognitive reorganization;Internal/external aids;Functional tasks;SLP instruction and feedback;Compensatory strategies;Patient/family education   Potential to Achieve Goals Good   Potential Considerations Ability to learn/carryover  information;Cooperation/participation level;Previous level of function;Severity of impairments;Family/community support   SLP Home Exercise Plan identify barriers for ADLs and independence   Consulted and Agree with Plan of Care Patient      Patient will benefit from skilled therapeutic intervention in order to improve the following deficits and impairments:   Cognitive communication deficit    Problem List There are no active problems to display for this patient.  Dollene Primrose, MS/CCC- SLP  Leandrew Koyanagi 02/06/2016, 2:56 PM  [place] [facility] MAIN [facility] SERVICES [address] [place], [place], [address] Phone: [phone]   Fax:  [phone]   Name: LAURALIE BLACKSHER MRN: [id number] Date of Birth: 1985-08-06

## 2016-02-07 ENCOUNTER — Encounter: Payer: 59 | Admitting: Occupational Therapy

## 2016-02-07 ENCOUNTER — Ambulatory Visit: Payer: Commercial Managed Care - HMO | Admitting: Occupational Therapy

## 2016-02-07 ENCOUNTER — Ambulatory Visit: Payer: Commercial Managed Care - HMO | Admitting: Physical Therapy

## 2016-02-07 ENCOUNTER — Ambulatory Visit: Payer: 59 | Admitting: Physical Therapy

## 2016-02-07 ENCOUNTER — Ambulatory Visit: Payer: Commercial Managed Care - HMO | Admitting: Speech Pathology

## 2016-02-08 ENCOUNTER — Ambulatory Visit
Admission: EM | Admit: 2016-02-08 | Discharge: 2016-02-08 | Disposition: A | Discharge status: Home / Self Care | Payer: 59 | Attending: Family Medicine | Admitting: Family Medicine

## 2016-02-08 ENCOUNTER — Ambulatory Visit (INDEPENDENT_AMBULATORY_CARE_PROVIDER_SITE_OTHER): Payer: 59

## 2016-02-08 DIAGNOSIS — S91351A Open bite, right foot, initial encounter: Secondary | ICD-10-CM

## 2016-02-08 DIAGNOSIS — W540XXA Bitten by dog, initial encounter: Secondary | ICD-10-CM | POA: Diagnosis not present

## 2016-02-08 MED ORDER — AMOXICILLIN-POT CLAVULANATE 875-125 MG PO TABS: 1.0000 | ORAL_TABLET | Freq: Two times a day (BID) | ORAL | 0 refills | Status: DC

## 2016-02-08 MED ORDER — CEFTRIAXONE SODIUM 1 G IJ SOLR
1.0000 g | Freq: Once | INTRAMUSCULAR | Status: AC
  Administered 2016-02-08: 1 g via INTRAMUSCULAR

## 2016-02-08 MED ORDER — MUPIROCIN 2 % EX OINT: TOPICAL_OINTMENT | CUTANEOUS | 0 refills | Status: DC
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
## 2016-02-08 NOTE — ED Triage Notes (Signed)
Patient was bitten by her moms dog on Wednesday night. Patient walks with Canes and she was practicing walking without the canes, and accidentally stepped on the dog. The bite is to her right foot.
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
## 2016-02-08 NOTE — Discharge Instructions (Signed)
Take medication as prescribed. Elevate. Keep clean with soap and water.   Follow up with your primary care physician in 2-3 days. Return to Urgent care or ER for new or worsening concerns.

## 2016-02-12 ENCOUNTER — Ambulatory Visit: Payer: Commercial Managed Care - HMO | Admitting: Occupational Therapy

## 2016-02-12 ENCOUNTER — Ambulatory Visit: Payer: Commercial Managed Care - HMO | Admitting: Speech Pathology

## 2016-02-12 ENCOUNTER — Encounter: Payer: 59 | Admitting: Occupational Therapy

## 2016-02-12 ENCOUNTER — Ambulatory Visit: Payer: 59 | Admitting: Physical Therapy

## 2016-02-12 ENCOUNTER — Encounter: Payer: 59 | Admitting: Speech Pathology

## 2016-02-12 ENCOUNTER — Ambulatory Visit: Payer: Commercial Managed Care - HMO | Admitting: Physical Therapy

## 2016-02-14 ENCOUNTER — Encounter: Payer: 59 | Admitting: Occupational Therapy

## 2016-02-14 ENCOUNTER — Ambulatory Visit: Payer: Commercial Managed Care - HMO | Admitting: Physical Therapy

## 2016-02-14 ENCOUNTER — Encounter: Payer: Self-pay | Admitting: Physical Therapy

## 2016-02-14 ENCOUNTER — Ambulatory Visit: Payer: Commercial Managed Care - HMO | Admitting: Occupational Therapy

## 2016-02-14 ENCOUNTER — Ambulatory Visit: Payer: 59 | Admitting: Physical Therapy

## 2016-02-14 DIAGNOSIS — R262 Difficulty in walking, not elsewhere classified: Secondary | ICD-10-CM

## 2016-02-14 DIAGNOSIS — M6281 Muscle weakness (generalized): Secondary | ICD-10-CM

## 2016-02-14 DIAGNOSIS — R278 Other lack of coordination: Secondary | ICD-10-CM

## 2016-02-14 NOTE — Patient Instructions (Signed)
OT TREATMENT    Neuro muscular re-education:  [facility]. performed [facility] tasks using the Grooved pegboard. Pt. worked on grasping the grooved pegs from a horizontal position, and moving the pegs to a vertical position in the hand to prepare for placing them in the grooved slot. Pt. performed [facility] skills training to improve speed and dexterity needed for ADL tasks and writing. Pt. demonstrated grasping 1 inch sticks,  inch cylindrical collars, and  inch flat washers on the Purdue pegboard. Pt. performed grasping each item with her 2nd digit and thumb, and storing them in the palm. Pt. presented with difficulty storing  inch objects at a time in the palmar aspect of the hand.  Therapeutic Exercise:  3# dumbbell ex. for elbow flexion and extension, forearm supination/pronation, wrist flexion/extension, and radial deviation. Strengthening exercises were performed to be able to lift, and transport items for cooking and kitchen related tasks.
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
## 2016-02-14 NOTE — Therapy (Signed)
[place] [facility] MAIN [facility] SERVICES [address] [place], [place], [address] Phone: [phone]   Fax:  [phone]  Physical Therapy Treatment  Patient Details  Name: Natasha [name] MRN: [id number] Date of Birth: 04-Nov-1985 Referring Provider: Orene Desanctis  Encounter Date: 02/14/2016      PT End of Session - 02/14/16 1442    Visit Number 8   Number of Visits 25   Date for PT Re-Evaluation 03/26/16   PT Start Time 0300   PT Stop Time 0345   PT Time Calculation (min) 45 min   Equipment Utilized During Treatment Gait belt   Activity Tolerance Patient tolerated treatment well   Behavior During Therapy [facility] for tasks assessed/performed      Past Medical History:  Diagnosis Date  . Polysubstance abuse   . Restless leg syndrome unk    Past Surgical History:  Procedure Laterality Date  . CHOLECYSTECTOMY    . TUBAL LIGATION      There were no vitals filed for this visit.      Subjective Assessment - 02/14/16 1531    Subjective Patient was bit by a dog on her right foot and it hurts.   Pertinent History TBI   Patient Stated Goals to be able to walk without the loftstrand crutches   Pain Score 4    Pain Location Foot   Pain Orientation Right   Pain Onset Today      octane bike x 15 minutes  Gait training with loftstrand crutches 1000 feet x 3 and without crutches 1000 feet    CGA and Min to mod verbal cues used throughout with increased in postural sway and LOB most seen with narrow base of support and while on uneven surfaces. Continues to have balance deficits typical with diagnosis.                       PT Education - 02/14/16 1441    Education provided Yes   Education Details HEP   Person(s) Educated Patient   Methods Explanation   Comprehension Verbalized understanding             PT Long Term Goals - 01/02/16 1432      PT LONG TERM GOAL #1   Title Patient will reduce timed up and go to  <11 seconds to reduce fall risk and demonstrate improved transfer/gait ability.   Time 12   Period Weeks   Status New     PT LONG TERM GOAL #2   Title Patient will increase six minute walk test distance to >1000 for progression to community ambulator and improve gait ability   Time 12   Period Weeks   Status New     PT LONG TERM GOAL #3   Title Patient will increase 10 meter walk test to >1.20m/s as to improve gait speed for better community ambulation and to reduce fall    Time 12   Period Weeks   Status New     PT LONG TERM GOAL #4   Title Patient will tolerate 5 seconds of single leg stance without loss of balance to improve ability to get in and out of shower safely   Time 12   Period Weeks   Status New     PT LONG TERM GOAL #5   Title Patient (< 71 years old) will complete five times sit to stand test in < 10 seconds indicating an increased LE strength and improved  balance   Time 12   Period Weeks   Status New               Plan - 02/14/16 1532    Clinical Impression Statement Patient needs to do seated LE strengthening due to her right foot being bit last week.    PT Frequency 2x / week   PT Duration 12 weeks   PT Treatment/Interventions Therapeutic activities;Therapeutic exercise;Balance training;Neuromuscular re-education;Stair training;Gait training;Patient/family education   PT Next Visit Plan strengthening and balance training; gait training   PT Home Exercise Plan balance in corner   Consulted and Agree with Plan of Care Patient      Patient will benefit from skilled therapeutic intervention in order to improve the following deficits and impairments:  Abnormal gait, Decreased balance, Decreased endurance, Decreased mobility, Difficulty walking, Decreased cognition, Decreased safety awareness, Decreased strength, Impaired UE functional use, Obesity  Visit Diagnosis: Muscle weakness (generalized)  Other lack of coordination  Difficulty in walking, not  elsewhere classified     Problem List There are no active problems to display for this patient.  Natasha [name], PT, DPT [place], [place] 02/14/2016, 4:03 PM  [place] [facility] [address] [place], [place], [address] Phone: [phone]   Fax:  [phone]  Name: Natasha [name] MRN: [id number] Date of Birth: 11/15/1985

## 2016-02-19 ENCOUNTER — Ambulatory Visit: Payer: Commercial Managed Care - HMO | Admitting: Occupational Therapy

## 2016-02-19 ENCOUNTER — Ambulatory Visit: Payer: 59 | Admitting: Physical Therapy

## 2016-02-19 ENCOUNTER — Ambulatory Visit: Payer: Commercial Managed Care - HMO | Admitting: Physical Therapy

## 2016-02-19 ENCOUNTER — Encounter: Payer: 59 | Admitting: Speech Pathology

## 2016-02-19 ENCOUNTER — Encounter: Payer: 59 | Admitting: Occupational Therapy

## 2016-02-19 ENCOUNTER — Ambulatory Visit: Payer: Commercial Managed Care - HMO | Admitting: Speech Pathology

## 2016-02-21 ENCOUNTER — Encounter: Payer: 59 | Admitting: Occupational Therapy

## 2016-02-21 ENCOUNTER — Ambulatory Visit: Payer: Commercial Managed Care - HMO | Admitting: Physical Therapy

## 2016-02-21 ENCOUNTER — Ambulatory Visit: Payer: Commercial Managed Care - HMO | Admitting: Occupational Therapy

## 2016-02-21 ENCOUNTER — Ambulatory Visit: Payer: Commercial Managed Care - HMO | Admitting: Speech Pathology

## 2016-02-21 ENCOUNTER — Encounter: Payer: 59 | Admitting: Speech Pathology

## 2016-02-21 ENCOUNTER — Encounter: Payer: Self-pay | Admitting: Physical Therapy

## 2016-02-21 ENCOUNTER — Encounter: Payer: Self-pay | Admitting: Occupational Therapy

## 2016-02-21 ENCOUNTER — Ambulatory Visit: Payer: 59 | Admitting: Physical Therapy

## 2016-02-21 DIAGNOSIS — M6281 Muscle weakness (generalized): Secondary | ICD-10-CM

## 2016-02-21 DIAGNOSIS — R278 Other lack of coordination: Secondary | ICD-10-CM

## 2016-02-21 DIAGNOSIS — R262 Difficulty in walking, not elsewhere classified: Secondary | ICD-10-CM

## 2016-02-21 DIAGNOSIS — R41841 Cognitive communication deficit: Secondary | ICD-10-CM
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
## 2016-02-21 NOTE — Patient Instructions (Signed)
OT TREATMENT    Measurements were obtained, and goals reviewed with pt.   Neuro muscular re-education:  Pt. Worked on grasping, turning, stacking, and flipping pegs on the instructo pegboard. Pt. Required increased time to complete.  Therapeutic Exercise:  Pt. Worked on the [employer] for 8 min. With constant monitoring of the BUEs. Pt. Worked on changing, and alternating forward reverse position every 2 min. Rest breaks were required.
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
## 2016-02-22 ENCOUNTER — Encounter: Payer: Self-pay | Admitting: Speech Pathology
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
## 2016-02-26 ENCOUNTER — Ambulatory Visit: Payer: 59 | Admitting: Physical Therapy

## 2016-02-26 ENCOUNTER — Ambulatory Visit: Payer: Commercial Managed Care - HMO | Admitting: Physical Therapy

## 2016-02-26 ENCOUNTER — Encounter: Payer: 59 | Admitting: Occupational Therapy

## 2016-02-26 ENCOUNTER — Encounter: Payer: 59 | Admitting: Speech Pathology

## 2016-02-26 ENCOUNTER — Ambulatory Visit: Payer: Commercial Managed Care - HMO | Admitting: Speech Pathology

## 2016-02-26 ENCOUNTER — Ambulatory Visit: Payer: Commercial Managed Care - HMO | Admitting: Occupational Therapy

## 2016-02-26 ENCOUNTER — Encounter: Payer: Self-pay | Admitting: Speech Pathology

## 2016-02-26 ENCOUNTER — Encounter: Payer: Self-pay | Admitting: Occupational Therapy

## 2016-02-26 DIAGNOSIS — M6281 Muscle weakness (generalized): Secondary | ICD-10-CM | POA: Diagnosis not present

## 2016-02-26 DIAGNOSIS — R262 Difficulty in walking, not elsewhere classified: Secondary | ICD-10-CM

## 2016-02-26 DIAGNOSIS — R531 Weakness: Secondary | ICD-10-CM

## 2016-02-26 DIAGNOSIS — R41841 Cognitive communication deficit: Secondary | ICD-10-CM
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
## 2016-02-26 NOTE — Patient Instructions (Signed)
OT TREATMENT    Neuro muscular re-education:  Pt. worked on grasping 1/8" beads and placing them in a container. Pt. Worked grasping with her thumb to her 2nd digit, and thumb placing them in a container, and grasping them from various surfaces. Pt. worked on bilateral hand coordination skills using small paperclips.  Therapeutic Exercise:  Pt. performed 3.5# dowel ex. For UE strengthening secondary to weakness. Bilateral shoulder flexion, chest press, circular patterns, and elbow flexion/extension were performed..Pt. Worked on pinch strengthening in the left hand for lateral, and 3pt. pinch using yellow, red, green, and blue resistive clips. Pt. worked on placing the clips at various vertical and horizontal angles. Tactile and verbal cues were required for eliciting the desired movement.

## 2016-02-26 NOTE — Therapy (Signed)
[place] [facility] [address] [place], [place], [address] Phone: [phone]   Fax:  [phone]  Speech Language Pathology Treatment  Patient Details  Name: Natasha [name] MRN: [id number] Date of Birth: 03/01/1986 Referring Provider: Orene DesanctisBEHLING, KAREN  Encounter Date: 02/26/2016      End of Session - 02/26/16 1717    Visit Number 4   Number of Visits 20   Date for SLP Re-Evaluation 04/11/16   SLP Start Time 1500   SLP Stop Time  1557   SLP Time Calculation (min) 57 min      Past Medical History:  Diagnosis Date  . Polysubstance abuse   . Restless leg syndrome unk    Past Surgical History:  Procedure Laterality Date  . CHOLECYSTECTOMY    . TUBAL LIGATION      There were no vitals filed for this visit.      Subjective Assessment - 02/26/16 1715    Subjective The patient returns, eager to work   Currently in Pain? No/denies               ADULT SLP TREATMENT - 02/26/16 0001      General Information   Behavior/Cognition Alert;Cooperative;Pleasant mood   HPI TBI     Treatment Provided   Treatment provided Cognitive-Linquistic     Pain Assessment   Pain Assessment No/denies pain     Cognitive-Linquistic Treatment   Treatment focused on Cognition;Patient/family/caregiver education   Skilled Treatment READING COMPREHENSION/ABSTRACT LANAGUAGE: The patient completed a variety of logical solutions tasks:  State word given 4 multiple meanings with 75% accuracy.  State word given 6 context cues with 80% accuracy.   Identify word within a list given unusual description (eg. "the first, third, and fifth letter of the word are the same")  with 50% accuracy independently and 70% told her first response was incorrect.  Identify word within a list given unusual definition (eg. "a greeting to a friend-hijack hi Ree KidaJack") with 80% accuracy independently Errors due to impulsive responses, mis-reading words, reduced  flexibility, reduced access to common knowledge, and faulty memory for information.     Assessment / Recommendations / Plan   Plan Continue with current plan of care     Progression Toward Goals   Progression toward goals Progressing toward goals          SLP Education - 02/26/16 1716    Education provided Yes   Education Details Effect of impulsive responses, mis-reading words, reduced flexibility, reduced access to common knowledge, and faulty memory for information on reading comprehension and new learning   Person(s) Educated Patient   Methods Explanation   Comprehension Verbalized understanding            SLP Long Term Goals - 02/01/16 1425      SLP LONG TERM GOAL #1   Title Patient will demonstrate functional cognitive-communication skills for independent completion of personal responsibilities.   Time 10   Period Weeks   Status New     SLP LONG TERM GOAL #2   Title Patient will independently complete complex executive function skills tasks with 80% accuracy.   Time 10   Period Weeks   Status New          Plan - 02/26/16 1717    Clinical Impression Statement The patient is demonstrating behaviors that interfere with reading comprehension and new learning.  Behaviors include: impulsive responses, mis-reading words, reduced flexibility, reduced access to common knowledge, and faulty  memory for information.   Speech Therapy Frequency 2x / week   Duration Other (comment)   Treatment/Interventions Compensatory techniques;Cognitive reorganization;Internal/external aids;Functional tasks;SLP instruction and feedback;Compensatory strategies;Patient/family education   Potential to Achieve Goals Good   Potential Considerations Ability to learn/carryover information;Cooperation/participation level;Previous level of function;Severity of impairments;Family/community support   SLP Home Exercise Plan identify barriers for ADLs and independence   Consulted and Agree with Plan  of Care Patient      Patient will benefit from skilled therapeutic intervention in order to improve the following deficits and impairments:   Cognitive communication deficit    Problem List There are no active problems to display for this patient.  Dollene PrimroseSusan G Starlina Lapre, MS/CCC- SLP  Leandrew KoyanagiAbernathy, Susie 02/26/2016, 5:20 PM  [place] [facility] [address] , [place], [address] Phone: [phone]   Fax:  [phone]   Name: Natasha [name] MRN: [id number] Date of Birth: 01/10/1986

## 2016-02-27 ENCOUNTER — Encounter: Payer: Self-pay | Admitting: Physical Therapy

## 2016-02-27 NOTE — Therapy (Signed)
[place] [facility] [address] [place], [place], [address] Phone: [phone]   Fax:  [phone]  Physical Therapy Treatment  Patient Details  Name: Natasha [name] MRN: [id number] Date of Birth: 05/17/1985 Referring Provider: Orene DesanctisBEHLING, KAREN  Encounter Date: 02/26/2016      PT End of Session - 02/27/16 0913    Visit Number 10   Number of Visits 25   Date for PT Re-Evaluation 03/26/16   PT Start Time 0400   PT Stop Time 0445   PT Time Calculation (min) 45 min      Past Medical History:  Diagnosis Date  . Polysubstance abuse   . Restless leg syndrome unk    Past Surgical History:  Procedure Laterality Date  . CHOLECYSTECTOMY    . TUBAL LIGATION      There were no vitals filed for this visit.      Subjective Assessment - 02/27/16 0912    Subjective Patient was bit by a dog on her right foot and it hurts.    Pertinent History TBI   Patient Stated Goals to be able to walk without the loftstrand crutches   Currently in Pain? Yes   Pain Score 2    Pain Location Foot   Pain Orientation Right   Pain Onset Today         Therapeutic exercise; Leg press x 20 x 3 130 lbs,  heel raises with 90 lbs x 20 x 3 Heel raises standing  4 way hip RTB x 20 Cross trainer x 15 mins CGA and Min to mod verbal cues used throughout with increased in postural sway and LOB most seen with narrow base of support and while on uneven surfaces. Continues to have balance deficits typical with diagnosis. Patient performs intermediate level exercises without pain behaviors and needs verbal cuing for postural alignment and head positioning                       PT Education - 02/27/16 0913    Education provided Yes   Education Details HEP   Person(s) Educated Patient   Methods Explanation   Comprehension Verbalized understanding             PT Long Term Goals - 01/02/16 1432      PT LONG TERM GOAL #1   Title Patient will reduce timed up and go to <11 seconds to reduce fall risk and demonstrate improved transfer/gait ability.   Time 12   Period Weeks   Status New     PT LONG TERM GOAL #2   Title Patient will increase six minute walk test distance to >1000 for progression to community ambulator and improve gait ability   Time 12   Period Weeks   Status New     PT LONG TERM GOAL #3   Title Patient will increase 10 meter walk test to >1.414m/s as to improve gait speed for better community ambulation and to reduce fall    Time 12   Period Weeks   Status New     PT LONG TERM GOAL #4   Title Patient will tolerate 5 seconds of single leg stance without loss of balance to improve ability to get in and out of shower safely   Time 12   Period Weeks   Status New     PT LONG TERM GOAL #5   Title Patient (< 30 years old) will complete five times sit  to stand test in < 10 seconds indicating an increased LE strength and improved balance   Time 12   Period Weeks   Status New               Plan - 02/26/16 0914    Clinical Impression Statement Patient is able to ambulate with loftstrand crutches outside and no device indoors. She continues to have dynamic standing balance deficits and slow gait speed and decreased strength BLE.   PT Frequency 2x / week   PT Duration 12 weeks   PT Treatment/Interventions Therapeutic activities;Therapeutic exercise;Balance training;Neuromuscular re-education;Stair training;Gait training;Patient/family education   PT Next Visit Plan strengthening and balance training; gait training   PT Home Exercise Plan balance in corner   Consulted and Agree with Plan of Care Patient      Patient will benefit from skilled therapeutic intervention in order to improve the following deficits and impairments:  Abnormal gait, Decreased balance, Decreased endurance, Decreased mobility, Difficulty walking, Decreased cognition, Decreased safety awareness, Decreased strength,  Impaired UE functional use, Obesity  Visit Diagnosis: Muscle weakness (generalized)  Difficulty in walking, not elsewhere classified  Weakness generalized     Problem List There are no active problems to display for this patient.   [name], Janele Lague S 02/27/2016, 9:16 AM  [place] [facility] [address] [place], [place], [address] Phone: [phone]   Fax:  [phone]  Name: Natasha [name] MRN: [id number] Date of Birth: 02/17/1986

## 2016-02-28 ENCOUNTER — Encounter: Payer: Self-pay | Admitting: Physical Therapy

## 2016-02-28 ENCOUNTER — Encounter: Payer: 59 | Admitting: Occupational Therapy

## 2016-02-28 ENCOUNTER — Ambulatory Visit: Payer: 59 | Attending: Pediatrics | Admitting: Speech Pathology

## 2016-02-28 ENCOUNTER — Ambulatory Visit: Payer: 59 | Admitting: Physical Therapy

## 2016-02-28 ENCOUNTER — Encounter: Payer: Self-pay | Admitting: Occupational Therapy

## 2016-02-28 ENCOUNTER — Ambulatory Visit: Payer: 59 | Admitting: Occupational Therapy

## 2016-02-28 DIAGNOSIS — R262 Difficulty in walking, not elsewhere classified: Secondary | ICD-10-CM | POA: Insufficient documentation

## 2016-02-28 DIAGNOSIS — R41841 Cognitive communication deficit: Secondary | ICD-10-CM | POA: Insufficient documentation

## 2016-02-28 DIAGNOSIS — M6281 Muscle weakness (generalized): Secondary | ICD-10-CM | POA: Insufficient documentation

## 2016-02-28 DIAGNOSIS — R278 Other lack of coordination: Secondary | ICD-10-CM | POA: Insufficient documentation

## 2016-02-28 NOTE — Patient Instructions (Signed)
OT TREATMENT    Neuro muscular re-education:  Reviewed a [facility] HEP with pt. Verbal cues, and visual demonstration was provided.   Therapeutic Exercise:  Pt. Worked on the [employer] for 8 min. With constant monitoring of the BUEs. Pt. Worked on changing, and alternating forward reverse position every 2 min. Rest breaks were required.   Selfcare:  Pt. worked on Art gallery managernavigating complex recipes, answering question about the recipe with increased time, and 100% accuracy.

## 2016-02-28 NOTE — Therapy (Signed)
[facility] MAIN [facility] SREHAB SERVICES [address] [place], [place], [address] Phone: [phone]   Fax:  [phone]  Physical Therapy Treatment  Patient Details  Name: Natasha [name] MRN: [id number] Date of Birth: 04/11/1986 Referring Provider: Orene DesanctisBEHLING, KAREN  Encounter Date: 02/28/2016      PT End of Session - 02/28/16 1521    Visit Number 12   Date for PT Re-Evaluation 03/26/16      Past Medical History:  Diagnosis Date  . Polysubstance abuse   . Restless leg syndrome unk    Past Surgical History:  Procedure Laterality Date  . CHOLECYSTECTOMY    . TUBAL LIGATION      There were no vitals filed for this visit.      Subjective Assessment - 02/28/16 1509    Subjective Patient was bit by a dog on her right foot and it hurts.    Pertinent History TBI   Patient Stated Goals to be able to walk without the loftstrand crutches   Currently in Pain? Yes   Pain Score 2    Pain Onset Today                                 PT Education - 02/28/16 1509    Education provided Yes   Education Details HEP   Person(s) Educated Patient   Methods Explanation   Comprehension Verbalized understanding             PT Long Term Goals - 01/02/16 1432      PT LONG TERM GOAL #1   Title Patient will reduce timed up and go to <11 seconds to reduce fall risk and demonstrate improved transfer/gait ability.   Time 12   Period Weeks   Status New     PT LONG TERM GOAL #2   Title Patient will increase six minute walk test distance to >1000 for progression to community ambulator and improve gait ability   Time 12   Period Weeks   Status New     PT LONG TERM GOAL #3   Title Patient will increase 10 meter walk test to >1.3458m/s as to improve gait speed for better community ambulation and to reduce fall    Time 12   Period Weeks   Status New     PT LONG TERM GOAL #4   Title Patient will tolerate 5 seconds of single  leg stance without loss of balance to improve ability to get in and out of shower safely   Time 12   Period Weeks   Status New     PT LONG TERM GOAL #5   Title Patient (< 30 years old) will complete five times sit to stand test in < 10 seconds indicating an increased LE strength and improved balance   Time 12   Period Weeks   Status New               Plan - 02/28/16 1512    Clinical Impression Statement Patinet continues to have decreased LE strength and decreased gait speed. She will continue to benefit from skilled PT to improve strength.    PT Frequency 2x / week   PT Duration 12 weeks   PT Treatment/Interventions Therapeutic activities;Therapeutic exercise;Balance training;Neuromuscular re-education;Stair training;Gait training;Patient/family education   PT Next Visit Plan strengthening and balance training; gait training   PT Home Exercise Plan balance in corner  Consulted and Agree with Plan of Care Patient      Patient will benefit from skilled therapeutic intervention in order to improve the following deficits and impairments:  Abnormal gait, Decreased balance, Decreased endurance, Difficulty walking, Decreased cognition, Decreased safety awareness, Decreased strength, Impaired UE functional use, Obesity  Visit Diagnosis: Muscle weakness (generalized)  Difficulty in walking, not elsewhere classified     Problem List There are no active problems to display for this patient.  [name].Tai Syfert S Fernando Torry, PT, DPT Jones Broom[place], Takeyla Million S 02/28/2016, 4:37 PM   [facility] [address] [place], [place], [address] Phone: [phone]   Fax:  [phone]  Name: Natasha [name] MRN: [id number] Date of Birth: 07/12/1985
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
## 2016-02-29 NOTE — Therapy (Signed)
[facility] [address] [place], [place], [address] Phone: [phone]   Fax:  [phone]  Speech Language Pathology Treatment  Patient Details  Name: Natasha [name] MRN: [id number] Date of Birth: 05/24/1985 Referring Provider: Orene DesanctisBEHLING, KAREN  Encounter Date: 02/28/2016      End of Session - 02/29/16 1019    Visit Number 5   Number of Visits 20   Date for SLP Re-Evaluation 04/11/16   SLP Start Time 1600   SLP Stop Time  1655   SLP Time Calculation (min) 55 min      Past Medical History:  Diagnosis Date  . Polysubstance abuse   . Restless leg syndrome unk    Past Surgical History:  Procedure Laterality Date  . CHOLECYSTECTOMY    . TUBAL LIGATION      There were no vitals filed for this visit.      Subjective Assessment - 02/29/16 1018    Subjective Wants to get in touch with Vocational [facility]hab               ADULT SLP TREATMENT - 02/29/16 0001      General Information   Behavior/Cognition Alert;Cooperative;Pleasant mood   HPI TBI     Treatment Provided   Treatment provided Cognitive-Linquistic     Pain Assessment   Pain Assessment No/denies pain     Cognitive-Linquistic Treatment   Treatment focused on Cognition;Patient/family/caregiver education   Skilled Treatment INDEPENDENCE/SELF-RESPONSIBILITY: the patient reports that she is making up her own pill boxes and takes her medicine independently.  She reports that her mother is providing more supervision than she feels that she needs.  READING COMPREHENSION/ABSTRACT LANAGUAGE: The patient completed a variety of logical solutions tasks:  State pattern given 5 unrelated words given mod SLP cues.  Answer questions about a map of 6 imaginary blocks of a town with 80% accuracy.  Complete a process of elimination task with 90% accuracy independently.  Errors due to impulsive responses, mis-reading words, reduced flexibility, reduced access to common  knowledge, and faulty memory for information.     Assessment / Recommendations / Plan   Plan Continue with current plan of care     Progression Toward Goals   Progression toward goals Progressing toward goals          SLP Education - 02/29/16 1018    Education provided Yes   Education Details Strategies to improve independence   Person(s) Educated Patient   Methods Explanation   Comprehension Verbalized understanding            SLP Long Term Goals - 02/01/16 1425      SLP LONG TERM GOAL #1   Title Patient will demonstrate functional cognitive-communication skills for independent completion of personal responsibilities.   Time 10   Period Weeks   Status New     SLP LONG TERM GOAL #2   Title Patient will independently complete complex executive function skills tasks with 80% accuracy.   Time 10   Period Weeks   Status New          Plan - 02/29/16 1020    Clinical Impression Statement The patient demonstrates improved scanning for information in a complex setting.  The patient is demonstrating behaviors that interfere with reading comprehension and new learning.  Behaviors include: impulsive responses, mis-reading words, reduced flexibility, reduced access to common knowledge, and faulty memory for information.   Speech Therapy Frequency 2x / week   Duration Other (  comment)   Treatment/Interventions Compensatory techniques;Cognitive reorganization;Internal/external aids;Functional tasks;SLP instruction and feedback;Compensatory strategies;Patient/family education   Potential to Achieve Goals Good   Potential Considerations Ability to learn/carryover information;Cooperation/participation level;Previous level of function;Severity of impairments;Family/community support   SLP Home Exercise Plan Answer factual and inferential question RE: short paragraph   Consulted and Agree with Plan of Care Patient      Patient will benefit from skilled therapeutic intervention in  order to improve the following deficits and impairments:   Cognitive communication deficit    Problem List There are no active problems to display for this patient.  Dollene PrimroseSusan G Daschel Roughton, MS/CCC- SLP  Leandrew KoyanagiAbernathy, Susie 02/29/2016, 10:25 AM  [place] [facility] [address] [place], [place], [address] Phone: [phone]   Fax:  [phone]   Name: Natasha ShadeWhitney B [name] MRN: [id number] Date of Birth: 09/20/1985

## 2016-03-03 ENCOUNTER — Ambulatory Visit: Payer: 59 | Admitting: Occupational Therapy

## 2016-03-03 ENCOUNTER — Ambulatory Visit: Payer: 59 | Admitting: Speech Pathology

## 2016-03-03 ENCOUNTER — Ambulatory Visit: Payer: 59 | Admitting: Physical Therapy

## 2016-03-04 ENCOUNTER — Encounter: Payer: Commercial Managed Care - HMO | Admitting: Occupational Therapy

## 2016-03-04 ENCOUNTER — Ambulatory Visit: Payer: Commercial Managed Care - HMO | Admitting: Physical Therapy

## 2016-03-05 ENCOUNTER — Ambulatory Visit: Payer: 59 | Admitting: Physical Therapy

## 2016-03-05 ENCOUNTER — Ambulatory Visit: Payer: 59 | Admitting: Speech Pathology

## 2016-03-05 ENCOUNTER — Ambulatory Visit: Payer: 59 | Admitting: Occupational Therapy

## 2016-03-05 ENCOUNTER — Encounter: Payer: Self-pay | Admitting: Physical Therapy

## 2016-03-05 DIAGNOSIS — M6281 Muscle weakness (generalized): Secondary | ICD-10-CM

## 2016-03-05 DIAGNOSIS — R278 Other lack of coordination: Secondary | ICD-10-CM

## 2016-03-05 DIAGNOSIS — R262 Difficulty in walking, not elsewhere classified: Secondary | ICD-10-CM

## 2016-03-05 DIAGNOSIS — R41841 Cognitive communication deficit: Secondary | ICD-10-CM | POA: Diagnosis not present

## 2016-03-05 NOTE — Patient Instructions (Signed)
OT TREATMENT    Neuro muscular re-education:  Pt. worked on grasping 1/2" pegs, and placing them on a pegboard following a Photographervisual design. Pt. worked on grasping, turning, and placing 1/4" objects with long nosed tweezers. Pt. Worked on removing them alternating thumb opposition to the tip of her 2nd digit, and thumb.   Selfcare:  Pt. worked on Nurse, adultnavigating schedules of performances. Pt. requires cues for finding the correct time frames for the performances. Increased time was required.
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
## 2016-03-05 NOTE — Therapy (Signed)
[facility] [address] [place], [place], [address] Phone: [phone]   Fax:  [phone]  Physical Therapy Treatment  Patient Details  Name: Natasha [name] MRN: [id number] Date of Birth: 01/06/1986 Referring Provider: Orene DesanctisBEHLING, KAREN  Encounter Date: 03/05/2016      PT End of Session - 03/05/16 1344    Visit Number 13   Date for PT Re-Evaluation 03/26/16   PT Start Time 0100   PT Stop Time 0145   PT Time Calculation (min) 45 min   Equipment Utilized During Treatment Gait belt   Activity Tolerance Patient tolerated treatment well   Behavior [facility] Therapy [facility] for tasks assessed/performed      Past Medical History:  Diagnosis Date  . Polysubstance abuse   . Restless leg syndrome unk    Past Surgical History:  Procedure Laterality Date  . CHOLECYSTECTOMY    . TUBAL LIGATION      There were no vitals filed for this visit.      Subjective Assessment - 03/05/16 1340    Subjective Patient says that her foot is getting better.    Pertinent History TBI   Patient Stated Goals to be able to walk without the loftstrand crutches   Pain Score 1    Pain Location Foot   Pain Descriptors / Indicators Aching   Pain Onset Today   Multiple Pain Sites No       Therapeutic exercise; Quadriped AI with UE/ LE with 5 sec hold x 5 x 3 sets Bridging x 20 sidleying abd x 15 x 2 SLR x 15 x 2 Leg press 120 lbs x 20 x 2 TM walking at 1.0 m/sec x 10 minutes with elevation of 2 Standing hip 4 way x 20 Patient needs occasional verbal cueing to improve posture and cueing to correctly perform exercises slowly, holding at end of range to increase motor firing of desired muscle to encourage fatigue.                           PT Education - 03/05/16 1341    Education provided Yes   Education Details HEP review   Person(s) Educated Patient   Methods Explanation   Comprehension Verbalized understanding              PT Long Term Goals - 01/02/16 1432      PT LONG TERM GOAL #1   Title Patient will reduce timed up and go to <11 seconds to reduce fall risk and demonstrate improved transfer/gait ability.   Time 12   Period Weeks   Status New     PT LONG TERM GOAL #2   Title Patient will increase six minute walk test distance to >1000 for progression to community ambulator and improve gait ability   Time 12   Period Weeks   Status New     PT LONG TERM GOAL #3   Title Patient will increase 10 meter walk test to >1.7817m/s as to improve gait speed for better community ambulation and to reduce fall    Time 12   Period Weeks   Status New     PT LONG TERM GOAL #4   Title Patient will tolerate 5 seconds of single leg stance without loss of balance to improve ability to get in and out of shower safely   Time 12   Period Weeks   Status New     PT  LONG TERM GOAL #5   Title Patient (< 30 years old) will complete five times sit to stand test in < 10 seconds indicating an increased LE strength and improved balance   Time 12   Period Weeks   Status New               Plan - 03/05/16 1344    Clinical Impression Statement Patient has weakness in core and BLE and was able to perform exercises to improve her strength and balance.    Rehab Potential Good   PT Frequency 2x / week   PT Duration 12 weeks   PT Treatment/Interventions Therapeutic activities;Therapeutic exercise;Balance training;Neuromuscular re-education;Stair training;Gait training;Patient/family education   PT Next Visit Plan strengthening and balance training; gait training   PT Home Exercise Plan balance in corner   Consulted and Agree with Plan of Care Patient      Patient will benefit from skilled therapeutic intervention in order to improve the following deficits and impairments:  Abnormal gait, Decreased balance, Decreased endurance, Difficulty walking, Decreased cognition, Decreased safety awareness, Decreased  strength, Impaired UE functional use, Obesity  Visit Diagnosis: Muscle weakness (generalized)  Difficulty in walking, not elsewhere classified     Problem List There are no active problems to display for this patient.   [name], [name] 03/05/2016, 2:41 PM   [facility] [address] [place], [place], [address] Phone: [phone]   Fax:  [phone]  Name: Natasha [name] MRN: [id number] Date of Birth: 10/16/1985

## 2016-03-06 ENCOUNTER — Encounter: Payer: Self-pay | Admitting: Speech Pathology
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
## 2016-03-10 ENCOUNTER — Ambulatory Visit: Payer: 59 | Admitting: Occupational Therapy

## 2016-03-10 ENCOUNTER — Ambulatory Visit: Payer: 59 | Admitting: Physical Therapy

## 2016-03-10 ENCOUNTER — Ambulatory Visit: Payer: 59 | Admitting: Speech Pathology

## 2016-03-10 ENCOUNTER — Encounter: Payer: Self-pay | Admitting: Physical Therapy

## 2016-03-10 ENCOUNTER — Encounter: Payer: Self-pay | Admitting: Speech Pathology

## 2016-03-10 DIAGNOSIS — M6281 Muscle weakness (generalized): Secondary | ICD-10-CM

## 2016-03-10 DIAGNOSIS — R278 Other lack of coordination: Secondary | ICD-10-CM

## 2016-03-10 DIAGNOSIS — R41841 Cognitive communication deficit: Secondary | ICD-10-CM

## 2016-03-10 DIAGNOSIS — R262 Difficulty in walking, not elsewhere classified: Secondary | ICD-10-CM

## 2016-03-10 NOTE — Patient Instructions (Signed)
OT TREATMENT    Neuro muscular re-education:  Pt. worked on [facility] skills manipulating knots on various sizes, and textures of ropes. Pt. worked on bilateral hand coordination skills manipulating paperclips.  Selfcare:  Pt. worked on Customer service managersimulated check writing tasks, filling out a Interior and spatial designercheck register, and calculating balances. Pt. requires cues for subtracting balances.

## 2016-03-10 NOTE — Therapy (Signed)
[place] [facility] [address] [place], [place], [address] Phone: [phone]   Fax:  [phone]  Speech Language Pathology Treatment  Patient Details  Name: Natasha [name] MRN: [id number] Date of Birth: 07/30/1985 Referring Provider: Orene DesanctisBEHLING, KAREN  Encounter Date: 03/10/2016      End of Session - 03/10/16 1722    Visit Number 7   Number of Visits 20   Date for SLP Re-Evaluation 04/11/16   SLP Start Time 1335   SLP Stop Time  1430   SLP Time Calculation (min) 55 min   Activity Tolerance Patient tolerated treatment well      Past Medical History:  Diagnosis Date  . Polysubstance abuse   . Restless leg syndrome unk    Past Surgical History:  Procedure Laterality Date  . CHOLECYSTECTOMY    . TUBAL LIGATION      There were no vitals filed for this visit.      Subjective Assessment - 03/10/16 1720    Subjective The patient reports that she has complete some homework activities but not all. She did not bring homework to the session but plans to bring it next time.   Currently in Pain? No/denies               ADULT SLP TREATMENT - 03/10/16 0001      General Information   Behavior/Cognition Alert;Cooperative;Pleasant mood   HPI TBI     Treatment Provided   Treatment provided Cognitive-Linquistic     Pain Assessment   Pain Assessment No/denies pain     Cognitive-Linquistic Treatment   Treatment focused on Cognition;Patient/family/caregiver education   Skilled Treatment ATTENTION: The patient completed an exercise requiring divided attention in which she copied the last letter of every word in a paragraph - in order - to create and hidden message. She completed this task independently with no cues in 5 minutes or less. The patient completed word puzzles involving process of elimination and multiple steps. She completed these tasks successfully and independently - occasionally seeking affirmation for a  decision. On the first task, she made one error (8%), which she self-corrected. On the second task, she required one verbal cue; with this support, she was 100% successful in identifying solutions. READING COMPREHENSION: The patient completed word puzzles requiring critical thinking about literal meanings. She correctly solved 60% of items independently. With one verbal or visual cue, success increased to 90%. The patient read an article aloud and answered multiple-choice comprehension questions. She successfully answered  of questions independently; with a cue, success increased to 100%     Assessment / Recommendations / Plan   Plan Continue with current plan of care     Progression Toward Goals   Progression toward goals Progressing toward goals          SLP Education - 03/10/16 1721    Education provided Yes   Education Details Identify key words to help with reading comprehension    Person(s) Educated Patient   Methods Explanation;Demonstration   Comprehension Verbalized understanding;Returned demonstration            SLP Long Term Goals - 02/01/16 1425      SLP LONG TERM GOAL #1   Title Patient will demonstrate functional cognitive-communication skills for independent completion of personal responsibilities.   Time 10   Period Weeks   Status New     SLP LONG TERM GOAL #2   Title Patient will independently complete complex executive function skills  tasks with 80% accuracy.   Time 10   Period Weeks   Status New          Plan - 03/10/16 1722    Clinical Impression Statement The patient reported poor memory of article read at home and of whether or not she had completed a certain activity in past sessions. However, she demonstrated strong reading comprehension (4th grade reading level material), good attention during several word puzzles, and quickly implemented strategy of identifying key words in question following clinician example.    Speech Therapy Frequency 2x /  week   Duration Other (comment)   Treatment/Interventions Compensatory techniques;Cognitive reorganization;Internal/external aids;Functional tasks;SLP instruction and feedback;Compensatory strategies;Patient/family education   Potential to Achieve Goals Good   Potential Considerations Ability to learn/carryover information;Cooperation/participation level;Previous level of function;Severity of impairments;Family/community support   SLP Home Exercise Plan Finish in-progress homework activities and bring to next session   Consulted and Agree with Plan of Care Patient      Patient will benefit from skilled therapeutic intervention in order to improve the following deficits and impairments:   Cognitive communication deficit    Problem List There are no active problems to display for this patient.   Raymondo BandIrene Gavin Telford  Graduate Student Clinician 03/10/2016, 5:27 PM  [place] [facility] [address] [place], [place], [address] Phone: [phone]   Fax:  [phone]   Name: Natasha ShadeWhitney B [name] MRN: [id number] Date of Birth: 06/24/1985

## 2016-03-10 NOTE — Therapy (Signed)
[place] MAIN [facility] SERVICES [address] [place], [place], [address] Phone: [phone]   Fax:  [phone]  Physical Therapy Treatment  Patient Details  Name: Natasha [name] MRN: [id number] Date of Birth: [birth year] Referring Provider: Barbaraann Boys  Encounter Date: 03/10/2016      PT End of Session - 03/10/16 1408    Visit Number 14   Number of Visits 26   Date for PT Re-Evaluation 03/26/16   PT Start Time 0345   PT Stop Time 0430   PT Time Calculation (min) 45 min   Equipment Utilized During Treatment Gait belt   Activity Tolerance Patient tolerated treatment well   Behavior During Therapy [facility] for tasks assessed/performed      Past Medical History:  Diagnosis Date  . Polysubstance abuse   . Restless leg syndrome unk    Past Surgical History:  Procedure Laterality Date  . CHOLECYSTECTOMY    . TUBAL LIGATION      There were no vitals filed for this visit.       the house OUTCOME MEASURES: TEST Outcome Interpretation  5 times sit<>stand 11.75sec >60 yo, >15 sec indicates increased risk for falls  10 meter walk test    1.0            m/s <1.0 m/s indicates increased risk for falls; limited community ambulator  Timed up and Go    13.96             sec <14 sec indicates increased risk for falls  6 minute walk test  700             Feet 1000 feet is community ambulator             Therapeutic Exercise:  Nustep L2 x3 min, L5 x7 minutes with SPM above 85  Cues for appropriate SPM  B LE standing hip SLR, abd, ext 2x10 with GTB LAQs with GTB 2x10 Mini squats 2x10 Heel raises 2x10 Leg press 1x10 with 90#, 2x10 with 100#; min verbal cues for proper technique Patient needs occasional verbal cueing to improve posture and cueing to correctly perform exercises slowly, holding at end of range to increase motor firing of desired muscle to encourage fatigue.                         PT Education -  03/10/16 1407    Education provided Yes   Education Details saftey with steps   Person(s) Educated Patient   Methods Explanation   Comprehension Verbalized understanding             PT Long Term Goals - 03/10/16 1409      PT LONG TERM GOAL #1   Title Patient will reduce timed up and go to <11 seconds to reduce fall risk and demonstrate improved transfer/gait ability.   Time 12   Period Weeks   Status Partially Met     PT LONG TERM GOAL #2   Title Patient will increase six minute walk test distance to >1000 for progression to community ambulator and improve gait ability   Time 12   Period Weeks   Status Partially Met     PT LONG TERM GOAL #3   Title Patient will increase 10 meter walk test to >1.43ms as to improve gait speed for better community ambulation and to reduce fall    Time 12   Period Weeks   Status Partially Met  PT LONG TERM GOAL #4   Title Patient will tolerate 5 seconds of single leg stance without loss of balance to improve ability to get in and out of shower safely   Time 12   Period Weeks   Status Partially Met     PT LONG TERM GOAL #5   Title Patient (< 54 years old) will complete five times sit to stand test in < 10 seconds indicating an increased LE strength and improved balance   Time 12   Period Weeks   Status Partially Met             Patient will benefit from skilled therapeutic intervention in order to improve the following deficits and impairments:     Visit Diagnosis: Muscle weakness (generalized)  Other lack of coordination  Difficulty in walking, not elsewhere classified     Problem List There are no active problems to display for this patient.   Alanson Puls 03/10/2016, 2:12 PM  [place] MAIN [facility] SERVICES [address] [place], [place], [address] Phone: [phone]   Fax:  [phone]  Name: Natasha [name] MRN: [id number] Date of Birth:  1986/04/27

## 2016-03-10 NOTE — Therapy (Signed)
[place] MAIN [facility] SERVICES [address] [place], [place], [address] Phone: [phone]   Fax:  [phone]  Occupational Therapy Treatment  Patient Details  Name: Natasha [name] MRN: [id number] Date of Birth: July 31, 1985 No Data Recorded  Encounter Date: 03/10/2016      OT End of Session - 03/10/16 1539    Visit Number 14   Number of Visits 20   Date for OT Re-Evaluation 03/14/16   Authorization Type 23visits per calendar year   OT Start Time 1503   OT Stop Time 1545   OT Time Calculation (min) 42 min   Activity Tolerance Patient tolerated treatment well   Behavior During Therapy [facility] for tasks assessed/performed      Past Medical History:  Diagnosis Date  . Polysubstance abuse   . Restless leg syndrome unk    Past Surgical History:  Procedure Laterality Date  . CHOLECYSTECTOMY    . TUBAL LIGATION      There were no vitals filed for this visit.      Subjective Assessment - 03/10/16 1508    Subjective  Pt. reports that she continues to be sick.   Patient is accompained by: Family member      OT TREATMENT    Neuro muscular re-education:  Pt. worked on [facility] skills manipulating knots on various sizes, and textures of ropes. Pt. worked on bilateral hand coordination skills manipulating paperclips.  Selfcare:  Pt. worked on Designer, multimedia tasks, filling out a Engineer, petroleum, and calculating balances. Pt. requires cues for subtracting balances, and navigating a check register.                            OT Education - 03/10/16 1509    Education provided Yes   Education Details [facility] skills, calculating checkbook balances.   Person(s) Educated Patient   Methods Explanation   Comprehension Verbalized understanding             OT Long Term Goals - 02/21/16 1440      OT LONG TERM GOAL #1   Title Patient will improve L UE strength by 1 mm grade to complete IADL tasks with  modified independence.     Baseline LUE shoulder strength 4/5, elbow flexion, extension 4+/5, wrist 4=/5   Time 12   Period Weeks   Status On-going     OT LONG TERM GOAL #2   Title Patient will complete laundry tasks with supervision.   Time 12   Period Weeks   Status Achieved     OT LONG TERM GOAL #3   Title Patient will pour tea from a pitcher without spillage   Time 12   Period Weeks   Status Achieved     OT LONG TERM GOAL #4   Title Patient will demonstrate the ability to transport snack items from the kitchen to the den without spilling items.    Time 12   Period Weeks   Status Achieved     OT LONG TERM GOAL #5   Title Patient will demonstrate the ability to choose her own clothing each day, matching 100% of the time.   Baseline mom has to perform.   Time 12   Period Weeks   Status Achieved     OT LONG TERM GOAL #6   Title Pt. will improve right [facility] skills needed for preparing ingredients for meal preparation.   Baseline Pt. has difficulty opening  snack packets and container lids   Time 12   Period Weeks   Status On-going     OT LONG TERM GOAL #7   Title Pt. will independently plan meals and menus for one week in preparation for creating a weekly shopping list.   Baseline Pt. currently has difficulty with meal planning, and creating shopping lists.   Time 12   Period Weeks   Status Partially Met     OT LONG TERM GOAL #8   Title Pt. will require supervision vacuuming while using compensatory strategies.   Baseline Pt. is unable to vacuuming   Time 12   Period Weeks   Status Achieved     OT LONG TERM GOAL  #9   Baseline Pt. will independently demonstrate organization and placement of items in the refrigerator for easier access.   Time 12   Period Weeks   Status Achieved     OT LONG TERM GOAL  #10   TITLE Pt. will independently calculate, manage, and simulate monthly bill management.      Baseline unable    Time 12   Period Weeks   Status Revised      OT LONG TERM GOAL  #11   TITLE Pt. will independently be able to put jewelry including fastening clasps.   Baseline difficulty   Time 12   Period Weeks   Status Achieved     OT LONG TERM GOAL  #12   TITLE Pt. will prepare and cook a complex, multiple step meal with supervision.    Baseline Pt. is able to cook a simple one step meal with S-CGA.   Time 12   Period Weeks   Status On-going     OT LONG TERM GOAL  #13   TITLE Pt. will independently decipher, and fill out various types of complex schedules with 100% accuracy.   Baseline Pt. able to fill out simple schedules   Time 12   Period Weeks   Status New               Plan - 03/10/16 1539    Clinical Impression Statement Pt. continues to work on improving [employer] [facility] skills for use during ADL tasks. Pt. continues to benefit from skilled OT services to work on improving IADL, and home managenment tasks including: managing monthly bills, and meal preparation skills.   Rehab Potential Good   OT Frequency 2x / week   OT Duration 12 weeks   OT Treatment/Interventions Self-care/ADL training;Therapeutic exercise;Moist Heat;Neuromuscular education;DME and/or AE instruction;Therapeutic activities;Patient/family education;Cognitive remediation/compensation   Consulted and Agree with Plan of Care Patient   Family Member Consulted mom      Patient will benefit from skilled therapeutic intervention in order to improve the following deficits and impairments:  Decreased cognition, Decreased knowledge of use of DME, Decreased coordination, Decreased mobility, Decreased endurance, Decreased strength, Decreased balance, Decreased safety awareness, Decreased knowledge of precautions, Impaired UE functional use  Visit Diagnosis: Other lack of coordination  Muscle weakness (generalized)    Problem List There are no active problems to display for this patient.   [name], MS, OTR/L 03/10/2016, 3:54 PM  [place] MAIN [facility] SERVICES [address] [place], [place], [address] Phone: [phone]   Fax:  [phone]  Name: Natasha [name] MRN: [id number] Date of Birth: July 26, 1985

## 2016-03-12 ENCOUNTER — Ambulatory Visit: Payer: 59 | Admitting: Occupational Therapy

## 2016-03-12 ENCOUNTER — Encounter: Payer: Self-pay | Admitting: Speech Pathology

## 2016-03-12 ENCOUNTER — Encounter: Payer: Self-pay | Admitting: Physical Therapy

## 2016-03-12 ENCOUNTER — Ambulatory Visit: Payer: 59 | Admitting: Speech Pathology

## 2016-03-12 ENCOUNTER — Ambulatory Visit: Payer: 59 | Admitting: Physical Therapy

## 2016-03-12 DIAGNOSIS — R41841 Cognitive communication deficit: Secondary | ICD-10-CM

## 2016-03-12 DIAGNOSIS — R262 Difficulty in walking, not elsewhere classified: Secondary | ICD-10-CM

## 2016-03-12 DIAGNOSIS — R278 Other lack of coordination: Secondary | ICD-10-CM

## 2016-03-12 DIAGNOSIS — M6281 Muscle weakness (generalized): Secondary | ICD-10-CM

## 2016-03-12 NOTE — Patient Instructions (Signed)
OT TREATMENT    Selfcare:  Pt. worked on meal preparation, cooking, and kiitchen related tasks. Pt. Was able to retrieve all ingredient items from the cabinetry, and appliances in preparation for the muffins. Pt. was able to prepare the ingredients into the bowl. Pt. did require cues for measurements, and reassurance before moving to the next step. Pt. was able to stir the mix, sort evenly into muffin tins, and set the timer. Pt. required cues to set the oven, and check the muffins for doneness.

## 2016-03-12 NOTE — Therapy (Addendum)
[place] MAIN [facility] SERVICES [address] [place], [place], [address] Phone: [phone]   Fax:  [phone]  Physical Therapy Treatment  Patient Details  Name: RADONNA BRACHER MRN: [id number] Date of Birth: [birth year] Referring Provider: Barbaraann Boys  Encounter Date: 03/12/2016      PT End of Session - 03/12/16 1616    Visit Number 15   Number of Visits 26   Date for PT Re-Evaluation 03/26/16   PT Start Time 0345   PT Stop Time 0430   PT Time Calculation (min) 45 min   Equipment Utilized During Treatment Gait belt   Activity Tolerance Patient tolerated treatment well   Behavior During Therapy [facility] for tasks assessed/performed      Past Medical History:  Diagnosis Date  . Polysubstance abuse   . Restless leg syndrome unk    Past Surgical History:  Procedure Laterality Date  . CHOLECYSTECTOMY    . TUBAL LIGATION      There were no vitals filed for this visit.      Subjective Assessment - 03/12/16 1614    Subjective Pateint says that she is feeling better as far as her cold and her foot.   Pertinent History TBI   Patient Stated Goals to be able to walk without the loftstrand crutches   Currently in Pain? No/denies   Pain Score 0-No pain   Multiple Pain Sites No      Therapeutic Exercise:  Standing exercises with RTB BLE : Marching 2 x 10; SLR 2 x 10; Abduction 2 x 10; Extension 2 x 10 Heel raises 2 x 10; Eccentric step downs x 10 BLE Resisted side-steeping RTB 4 lengths x 2 Step-ups to 6" step x 10 bilateral;: Standing on 1/2 foam flat side up and flat side down.  Side step ups left and right x 15  Min verbal cues for proper technique and CGA when stepping up with LLE and min A for RLE due to increased weakness and unsteadiness with RLE                           PT Education - 03/12/16 1615    Education provided Yes   Education Details safety with steps and using the railing   Person(s) Educated Patient   Methods Explanation   Comprehension Verbalized understanding             PT Long Term Goals - 03/12/16 1756      PT LONG TERM GOAL #1   Title Patient will reduce timed up and go to <11 seconds to reduce fall risk and demonstrate improved transfer/gait ability.   Time 12   Period Weeks   Status Partially Met     PT LONG TERM GOAL #2   Title Patient will increase six minute walk test distance to >1000 for progression to community ambulator and improve gait ability   Time 12   Period Weeks   Status Partially Met     PT LONG TERM GOAL #3   Title Patient will increase 10 meter walk test to >1.66ms as to improve gait speed for better community ambulation and to reduce fall    Time 12   Period Weeks   Status Partially Met     PT LONG TERM GOAL #4   Title Patient will tolerate 5 seconds of single leg stance without loss of balance to improve ability to get in and out of shower  safely   Time 12   Period Weeks   Status Partially Met     PT LONG TERM GOAL #5   Title Patient (< 74 years old) will complete five times sit to stand test in < 10 seconds indicating an increased LE strength and improved balance   Time 12   Period Weeks   Status Partially Met               Plan - 03/12/16 1616    Clinical Impression Statement Strength is improving but needs assistance to keep form and obtain the proper position with open chain exericses.    Rehab Potential Good   PT Frequency 2x / week   PT Duration 12 weeks   PT Treatment/Interventions Therapeutic activities;Therapeutic exercise;Balance training;Neuromuscular re-education;Stair training;Gait training;Patient/family education   PT Next Visit Plan strengthening and balance training; gait training   PT Home Exercise Plan balance in corner   Consulted and Agree with Plan of Care Patient      Patient will benefit from skilled therapeutic intervention in order to improve the following deficits and  impairments:  Abnormal gait, Decreased balance, Decreased endurance, Difficulty walking, Decreased cognition, Decreased safety awareness, Decreased strength, Impaired UE functional use, Obesity  Visit Diagnosis: Muscle weakness (generalized)  Other lack of coordination  Difficulty in walking, not elsewhere classified     Problem List There are no active problems to display for this patient.   Alanson Puls 03/12/2016, 6:00 PM  [place] MAIN [facility] SERVICES [address] [place], [place], [address] Phone: [phone]   Fax:  [phone]  Name: MIKAELA HILGEMAN MRN: [id number] Date of Birth: [birth year]

## 2016-03-12 NOTE — Therapy (Signed)
[place] [facility] [address] [place], [place], [address] Phone: [phone]   Fax:  [phone]  Speech Language Pathology Treatment  Patient Details  Name: Natasha [name] MRN: [id number] Date of Birth: 10/30/1985 Referring Provider: Orene DesanctisBEHLING, KAREN  Encounter Date: 03/12/2016      End of Session - 03/12/16 1704    Visit Number 8   Number of Visits 20   Date for SLP Re-Evaluation 04/11/16   SLP Start Time 1355   SLP Stop Time  1455   SLP Time Calculation (min) 60 min   Activity Tolerance Patient tolerated treatment well      Past Medical History:  Diagnosis Date  . Polysubstance abuse   . Restless leg syndrome unk    Past Surgical History:  Procedure Laterality Date  . CHOLECYSTECTOMY    . TUBAL LIGATION      There were no vitals filed for this visit.      Subjective Assessment - 03/12/16 1703    Subjective The patient completed one homework activity and brought it in.    Currently in Pain? No/denies               ADULT SLP TREATMENT - 03/12/16 0001      General Information   Behavior/Cognition Alert;Cooperative;Pleasant mood   HPI TBI     Treatment Provided   Treatment provided Cognitive-Linquistic     Pain Assessment   Pain Assessment No/denies pain     Cognitive-Linquistic Treatment   Treatment focused on Cognition;Patient/family/caregiver education   Skilled Treatment PROBLEM SOLVING: The patient and clinician discussed the patient's career interests. The patient problem-solved regarding an education-related question, arrived at solution of calling community college, and rehearsed and executed phone call inquiry with clinician support. On the first phone call attempt, the patient became concerned that she did not know her student ID number and the call was ended prior to leaving a voicemail recording. Then the patient and clinician problem-solved, patient made the call again and left a  voicemail with rehearsed message. The patient independently completed an online career interest questionnaire. These tasks required critical thinking, problem solving, planning, working & short-term memory, and sustained and divided attention. After reading two-sentence stories, the patient identified the problem and solution from 3 multiple-choice options with 80% accuracy on the first attempt. For additional scenario descriptions, the patient identified the problem and generated possible solutions. She independently identified the problem with 100% success. She independently generated at least one appropriate solution with 75% success; success increased to 100% given leading questions. Provided a longer scenario (3 paragraphs), the patient correctly identified the problem and interpreted the meaning of an idiom but required support (leading questions) to generate possible solutions to the problem. READING COMPREHENSION: The patient brought an activity she had completed as homework. She answered questions using information from a written paragraph with 100% success. Provided a visual stimulus (phone bill or job advertisement), the patient accurately interpreted the provided information to answer questions on 56% of trials. Provided leading questions, accuracy improved to 81%.       Assessment / Recommendations / Plan   Plan Continue with current plan of care     Progression Toward Goals   Progression toward goals Progressing toward goals          SLP Education - 03/12/16 1703    Education provided Yes   Education Details problem solving strategies   Person(s) Educated Patient   Methods Demonstration   Comprehension  Verbalized understanding            SLP Long Term Goals - 02/01/16 1425      SLP LONG TERM GOAL #1   Title Patient will demonstrate functional cognitive-communication skills for independent completion of personal responsibilities.   Time 10   Period Weeks   Status New      SLP LONG TERM GOAL #2   Title Patient will independently complete complex executive function skills tasks with 80% accuracy.   Time 10   Period Weeks   Status New          Plan - 03/12/16 1706    Clinical Impression Statement The patient demonstrated strong ability to identify the problem and to identify the best solution when provided several options. Generating possible solutions is more challenging and requires some support but is a good level of challenge. It is more difficult to generate solutions when on the spot (live situation). "Analyzing Problems" activities in which the patient interprets information from a life-like visual stimulus (bill, advertisement, schedule, etc.) provide a good level of challenge in a relevant task.     Speech Therapy Frequency 2x / week   Duration Other (comment) 10 weeks   Treatment/Interventions Compensatory techniques;Cognitive reorganization;Internal/external aids;Functional tasks;SLP instruction and feedback;Compensatory strategies;Patient/family education   Potential to Achieve Goals Good   Potential Considerations Ability to learn/carryover information;Cooperation/participation level;Previous level of function;Severity of impairments;Family/community support   SLP Home Exercise Plan Finish in-progress homework activities and bring to next session; complete follow-up phone call to community college on Friday if needed   Consulted and Agree with Plan of Care Patient      Patient will benefit from skilled therapeutic intervention in order to improve the following deficits and impairments:   Cognitive communication deficit    Problem List There are no active problems to display for this patient.   Raymondo BandIrene Temiloluwa Recchia  Graduate Student Clinician 03/12/2016, 5:08 PM  [name] [facility] [address] [place], [place], [address] Phone: [phone]   Fax:  [phone]   Name: Natasha [name] MRN:  [id number] Date of Birth: 01/14/1986

## 2016-03-12 NOTE — Therapy (Deleted)
[place] MAIN [facility] SERVICES [address] [place], [place], [address] Phone: [phone]   Fax:  [phone]  Physical Therapy Treatment  Patient Details  Name: Natasha [name] MRN: [id number] Date of Birth: 07-Nov-1985 Referring Provider: Barbaraann Boys  Encounter Date: 03/12/2016      PT End of Session - 03/12/16 1616    Visit Number 15   Number of Visits 26   Date for PT Re-Evaluation 03/26/16   PT Start Time 0345   PT Stop Time 0430   PT Time Calculation (min) 45 min   Equipment Utilized During Treatment Gait belt   Activity Tolerance Patient tolerated treatment well   Behavior During Therapy [facility] for tasks assessed/performed      Past Medical History:  Diagnosis Date  . Polysubstance abuse   . Restless leg syndrome unk    Past Surgical History:  Procedure Laterality Date  . CHOLECYSTECTOMY    . TUBAL LIGATION      There were no vitals filed for this visit.      Subjective Assessment - 03/12/16 1614    Subjective Pateint says that she is feeling better as far as her cold and her foot.   Pertinent History TBI   Patient Stated Goals to be able to walk without the loftstrand crutches   Currently in Pain? No/denies   Pain Score 0-No pain   Multiple Pain Sites No         Therapeutic Exercise:  Standing exercises with RTB BLE : Marching 2 x 10; SLR 2 x 10; Abduction 2 x 10; Extension 2 x 10; Heel raises 2 x 10; Eccentric step downs x 10 BLE Heel raises x 10 x 2  Resisted side-steeping RTB 4 lengths x 2; Step-ups to 6" step x 10 bilateral; Quantum leg press 100 x 20 x 3 1/2 foam flat side up and flat side down and balance  Patient needs occasional verbal cueing to improve posture and cueing to correctly perform exercises slowly, holding at end of range to increase motor firing of desired muscle to encourage fatigue.                          PT Education - 03/12/16 1615    Education  provided Yes   Education Details safety with steps and using the railing   Person(s) Educated Patient   Methods Explanation   Comprehension Verbalized understanding             PT Long Term Goals - 03/12/16 1756      PT LONG TERM GOAL #1   Title Patient will reduce timed up and go to <11 seconds to reduce fall risk and demonstrate improved transfer/gait ability.   Time 12   Period Weeks   Status Partially Met     PT LONG TERM GOAL #2   Title Patient will increase six minute walk test distance to >1000 for progression to community ambulator and improve gait ability   Time 12   Period Weeks   Status Partially Met     PT LONG TERM GOAL #3   Title Patient will increase 10 meter walk test to >1.17ms as to improve gait speed for better community ambulation and to reduce fall    Time 12   Period Weeks   Status Partially Met     PT LONG TERM GOAL #4   Title Patient will tolerate 5 seconds of single leg stance without  loss of balance to improve ability to get in and out of shower safely   Time 12   Period Weeks   Status Partially Met     PT LONG TERM GOAL #5   Title Patient (< 53 years old) will complete five times sit to stand test in < 10 seconds indicating an increased LE strength and improved balance   Time 12   Period Weeks   Status Partially Met               Plan - 03/12/16 1616    Clinical Impression Statement Strength is improving but needs assistance to keep form and obtain the proper position with open chain exericses.    Rehab Potential Good   PT Frequency 2x / week   PT Duration 12 weeks   PT Treatment/Interventions Therapeutic activities;Therapeutic exercise;Balance training;Neuromuscular re-education;Stair training;Gait training;Patient/family education   PT Next Visit Plan strengthening and balance training; gait training   PT Home Exercise Plan balance in corner   Consulted and Agree with Plan of Care Patient      Patient will benefit from  skilled therapeutic intervention in order to improve the following deficits and impairments:  Abnormal gait, Decreased balance, Decreased endurance, Difficulty walking, Decreased cognition, Decreased safety awareness, Decreased strength, Impaired UE functional use, Obesity  Visit Diagnosis: Muscle weakness (generalized)  Other lack of coordination  Difficulty in walking, not elsewhere classified     Problem List There are no active problems to display for this patient.   Alanson Puls 03/12/2016, 5:57 PM  [place] MAIN [facility] SERVICES [address] [place], [place], [address] Phone: [phone]   Fax:  [phone]  Name: Natasha [name] MRN: [id number] Date of Birth: [birth year]
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
## 2016-03-17 ENCOUNTER — Ambulatory Visit: Payer: 59 | Admitting: Speech Pathology

## 2016-03-17 ENCOUNTER — Encounter: Payer: Self-pay | Admitting: Speech Pathology

## 2016-03-17 ENCOUNTER — Ambulatory Visit: Payer: 59 | Admitting: Occupational Therapy

## 2016-03-17 ENCOUNTER — Ambulatory Visit: Payer: 59 | Admitting: Physical Therapy

## 2016-03-17 ENCOUNTER — Encounter: Payer: Self-pay | Admitting: Occupational Therapy

## 2016-03-17 ENCOUNTER — Encounter: Payer: Self-pay | Admitting: Physical Therapy

## 2016-03-17 DIAGNOSIS — R41841 Cognitive communication deficit: Secondary | ICD-10-CM

## 2016-03-17 DIAGNOSIS — R278 Other lack of coordination: Secondary | ICD-10-CM

## 2016-03-17 DIAGNOSIS — M6281 Muscle weakness (generalized): Secondary | ICD-10-CM

## 2016-03-17 DIAGNOSIS — R262 Difficulty in walking, not elsewhere classified: Secondary | ICD-10-CM

## 2016-03-17 NOTE — Therapy (Signed)
[facility] [address] [place], [place], [address] Phone: [phone]   Fax:  [phone]  Speech Language Pathology Treatment  Patient Details  Name: Natasha [name] MRN: [id number] Date of Birth: 10/22/1985 Referring Provider: Orene DesanctisBEHLING, KAREN  Encounter Date: 03/17/2016      End of Session - 03/17/16 1537    Visit Number 9   Number of Visits 20   Date for SLP Re-Evaluation 04/11/16   SLP Start Time 1355   SLP Stop Time  1455   SLP Time Calculation (min) 60 min   Activity Tolerance Patient tolerated treatment well      Past Medical History:  Diagnosis Date  . Polysubstance abuse   . Restless leg syndrome unk    Past Surgical History:  Procedure Laterality Date  . CHOLECYSTECTOMY    . TUBAL LIGATION      There were no vitals filed for this visit.      Subjective Assessment - 03/17/16 1535    Subjective The patient did not bring in homework today.    Currently in Pain? No/denies               ADULT SLP TREATMENT - 03/17/16 0001      General Information   Behavior/Cognition Alert;Cooperative;Pleasant mood   HPI TBI     Treatment Provided   Treatment provided Cognitive-Linquistic     Cognitive-Linquistic Treatment   Treatment focused on Cognition;Patient/family/caregiver education   Skilled Treatment PROBLEM SOLVING: With clinician support in form of leading questions, the patient completed problem-solving application regarding concerns around family conversation about possible relocation to group home. The patient independently generated and wrote down 7 questions to ask family members and group home staff. With clinician's leading questions, she wrote down 2 more questions she had expressed as a concern, and the clinician suggested 3 additional relevant questions. In response to clinician's leading question, patient identified internet search as a way to look up additional information about  local group home options for adults with TBI. She  decided on search terms with one modification suggested by clinician, selected relevant results, and navigated 3 websites for relevant information with occasional clinician support. The clinician provided education regarding strategies to more quickly locate relevant information (use menu bars, look for typical location of "contact us" on website, strategy of reading section headers to decide which sections to examine more thoroughly rather than reading all content on every page) and make decisions about search results and parts of web page to examine for desired content. The patient identified two relevant resources and wrote down contact information. ORGANIZATION: The patient received notebook to hold information regarding group homes, careers of interest, and homework assignment. She helped identify appropriate categories for the information to be stored in the notebook (1/3, increased to 2/3 with leading questions).      Assessment / Recommendations / Plan   Plan Continue with current plan of care     Progression Toward Goals   Progression toward goals Progressing toward goals          SLP Education - 03/17/16 1535    Education provided Yes   Education Details strategies to more quickly locate relevant information (use menu bars, notice patterns of content layout to quickly search, read section headers to decide which sections to examine)    Person(s) Educated Patient   Methods Explanation   Comprehension Verbalized understanding            SLP Long  Term Goals - 02/01/16 1425      SLP LONG TERM GOAL #1   Title Patient will demonstrate functional cognitive-communication skills for independent completion of personal responsibilities.   Time 10   Period Weeks   Status New     SLP LONG TERM GOAL #2   Title Patient will independently complete complex executive function skills tasks with 80% accuracy.   Time 10   Period Weeks    Status New          Plan - 03/17/16 1538    Clinical Impression Statement The patient generated multiple solutions (relevant questions for family discussion) for a real-life problem when provided with moderate clinician support. The clinician identified information to seek out to help answer her questions and sought frequent affirmation from the clinician as she completed her search. She required clinician support to complete this task more efficiently (within time frame of session).   Speech Therapy Frequency 2x / week   Duration Other (comment) 10 weeks   Treatment/Interventions Compensatory techniques;Cognitive reorganization;Internal/external aids;Functional tasks;SLP instruction and feedback;Compensatory strategies;Patient/family education   Potential to Achieve Goals Good   Potential Considerations Ability to learn/carryover information;Cooperation/participation level;Previous level of function;Severity of impairments;Family/community support   SLP Home Exercise Plan The patient will 1) use written questions to have conversation with family members about group home, 2) write down family members' responses and put in appropriate section of notebook, and 3) read job description of [employer], write down pros/cons and opinion, and file response appropriately in notebook.    Consulted and Agree with Plan of Care Patient      Patient will benefit from skilled therapeutic intervention in order to improve the following deficits and impairments:   Cognitive communication deficit    Problem List There are no active problems to display for this patient.   Raymondo BandIrene Jacolby Risby  Graduate Student Clinician 03/17/2016, 3:41 PM  [place] [facility] [address] [place], [place], [address] Phone: [phone]   Fax:  [phone]   Name: Natasha ShadeWhitney B [name] MRN: [id number] Date of Birth: 12/07/1985

## 2016-03-17 NOTE — Therapy (Signed)
[place] MAIN [facility] SERVICES [address] [place], [place], [address] Phone: [phone]   Fax:  [phone]  Physical Therapy Treatment  Patient Details  Name: Natasha [name] MRN: [id number] Date of Birth: Sep 11, 1985 Referring Provider: Barbaraann Boys  Encounter Date: 03/17/2016      PT End of Session - 03/17/16 1640    Visit Number 16   Number of Visits 26   Date for PT Re-Evaluation 03/26/16   PT Start Time 0345   PT Stop Time 0430   PT Time Calculation (min) 45 min   Equipment Utilized During Treatment Gait belt   Activity Tolerance Patient tolerated treatment well   Behavior During Therapy [facility] for tasks assessed/performed      Past Medical History:  Diagnosis Date  . Polysubstance abuse   . Restless leg syndrome unk    Past Surgical History:  Procedure Laterality Date  . CHOLECYSTECTOMY    . TUBAL LIGATION      There were no vitals filed for this visit.      Subjective Assessment - 03/17/16 1639    Subjective Pateint says that she is feeling better as far as her cold and her foot.   Pertinent History TBI   Patient Stated Goals to be able to walk without the loftstrand crutches   Currently in Pain? No/denies   Pain Score 0-No pain   Pain Onset Today      Therapeutic exercise: Leg press 100 lbs x 20 x 3 TM side stepping with elevation 2 . 3 m/hour x 5 mins left and 5 mins right Gait training: Ambulation without AD 1000 feet x 3 Patient needs occasional verbal cueing to improve posture and cueing to correctly perform exercises slowly, holding at end of range to increase motor firing of desired muscle to encourage fatigue.                            PT Education - 03/17/16 1639    Education provided Yes   Education Details safety with steps    Person(s) Educated Patient   Methods Explanation   Comprehension Verbalized understanding             PT Long Term Goals - 03/12/16  1756      PT LONG TERM GOAL #1   Title Patient will reduce timed up and go to <11 seconds to reduce fall risk and demonstrate improved transfer/gait ability.   Time 12   Period Weeks   Status Partially Met     PT LONG TERM GOAL #2   Title Patient will increase six minute walk test distance to >1000 for progression to community ambulator and improve gait ability   Time 12   Period Weeks   Status Partially Met     PT LONG TERM GOAL #3   Title Patient will increase 10 meter walk test to >1.44ms as to improve gait speed for better community ambulation and to reduce fall    Time 12   Period Weeks   Status Partially Met     PT LONG TERM GOAL #4   Title Patient will tolerate 5 seconds of single leg stance without loss of balance to improve ability to get in and out of shower safely   Time 12   Period Weeks   Status Partially Met     PT LONG TERM GOAL #5   Title Patient (< 645years old) will complete  five times sit to stand test in < 10 seconds indicating an increased LE strength and improved balance   Time 12   Period Weeks   Status Partially Met               Plan - 03/17/16 1641    Clinical Impression Statement Patient has decreased BLE strength and decreased static and dynamic standing balance and is able to perform activities and exercises to improve dynamic and static standing balance and for strengthening core and LE without pain behaviors.   Rehab Potential Good   PT Frequency 2x / week   PT Duration 12 weeks   PT Treatment/Interventions Therapeutic activities;Therapeutic exercise;Balance training;Neuromuscular re-education;Stair training;Gait training;Patient/family education   PT Next Visit Plan strengthening and balance training; gait training   PT Home Exercise Plan balance in corner   Consulted and Agree with Plan of Care Patient      Patient will benefit from skilled therapeutic intervention in order to improve the following deficits and impairments:   Abnormal gait, Decreased balance, Decreased endurance, Difficulty walking, Decreased cognition, Decreased safety awareness, Decreased strength, Impaired UE functional use, Obesity  Visit Diagnosis: Muscle weakness (generalized)  Difficulty in walking, not elsewhere classified     Problem List There are no active problems to display for this patient.  Alanson Puls, PT, DPT [place], Minette Headland S 03/17/2016, 4:54 PM  [place] MAIN [facility] SERVICES [address] [place], [place], [address] Phone: [phone]   Fax:  [phone]  Name: LARCENIA HOLADAY MRN: [id number] Date of Birth: 09/18/1985

## 2016-03-19 ENCOUNTER — Encounter: Payer: Self-pay | Admitting: Occupational Therapy

## 2016-03-19 ENCOUNTER — Ambulatory Visit: Payer: 59 | Admitting: Speech Pathology

## 2016-03-19 ENCOUNTER — Ambulatory Visit: Payer: 59 | Admitting: Physical Therapy

## 2016-03-19 ENCOUNTER — Encounter: Payer: Self-pay | Admitting: Physical Therapy

## 2016-03-19 ENCOUNTER — Ambulatory Visit: Payer: 59 | Admitting: Occupational Therapy

## 2016-03-19 ENCOUNTER — Encounter: Payer: Self-pay | Admitting: Speech Pathology

## 2016-03-19 ENCOUNTER — Encounter: Payer: Commercial Managed Care - HMO | Admitting: Occupational Therapy

## 2016-03-19 DIAGNOSIS — M6281 Muscle weakness (generalized): Secondary | ICD-10-CM

## 2016-03-19 DIAGNOSIS — R41841 Cognitive communication deficit: Secondary | ICD-10-CM

## 2016-03-19 DIAGNOSIS — R278 Other lack of coordination: Secondary | ICD-10-CM

## 2016-03-19 DIAGNOSIS — R262 Difficulty in walking, not elsewhere classified: Secondary | ICD-10-CM
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
## 2016-03-19 NOTE — Therapy (Signed)
[place] MAIN [facility] SERVICES [address] [place], [place], [address] Phone: [phone]   Fax:  [phone]  Physical Therapy Treatment  Patient Details  Name: COSANDRA PLOUFFE MRN: [id number] Date of Birth: 27-Dec-1985 Referring Provider: Barbaraann Boys  Encounter Date: 03/19/2016      PT End of Session - 03/19/16 1358    Visit Number 17   Number of Visits 26   Date for PT Re-Evaluation 03/26/16   PT Start Time 0350   PT Stop Time 1546   PT Time Calculation (min) 40 min   Equipment Utilized During Treatment Gait belt   Activity Tolerance Patient tolerated treatment well   Behavior During Therapy [facility] for tasks assessed/performed      Past Medical History:  Diagnosis Date  . Polysubstance abuse   . Restless leg syndrome unk    Past Surgical History:  Procedure Laterality Date  . CHOLECYSTECTOMY    . TUBAL LIGATION      There were no vitals filed for this visit.      Subjective Assessment - 03/19/16 1356    Subjective Pt denies any pain and reports no new changes since last session.  She has no questions or concerns at this time.   Pertinent History TBI   Patient Stated Goals to be able to walk without the loftstrand crutches   Currently in Pain? No/denies   Pain Onset Today       TREATMENT   Neuromuscular Rehab:  Tandem walking on airex in // bars with 1UE supported x10 lengths  Obstacle course with pt ambulating over airex, dynadisc, balance stones, and stepping over full foam roll x5 minutes   Therapeutic Exercise:  Eccentric step downs x20 BLE with cues for glute activation and controlled descent  Marching with RTB resist 2 x 10 each side in // bars  Resisted gait using Matrix machine forward, backward, sideways x3 each direction             PT Education - 03/19/16 1359    Education provided Yes   Education Details Exercise Technique, posture   Person(s) Educated Patient   Methods  Explanation;Demonstration   Comprehension Verbalized understanding;Returned demonstration;Need further instruction             PT Long Term Goals - 03/12/16 1756      PT LONG TERM GOAL #1   Title Patient will reduce timed up and go to <11 seconds to reduce fall risk and demonstrate improved transfer/gait ability.   Time 12   Period Weeks   Status Partially Met     PT LONG TERM GOAL #2   Title Patient will increase six minute walk test distance to >1000 for progression to community ambulator and improve gait ability   Time 12   Period Weeks   Status Partially Met     PT LONG TERM GOAL #3   Title Patient will increase 10 meter walk test to >1.50ms as to improve gait speed for better community ambulation and to reduce fall    Time 12   Period Weeks   Status Partially Met     PT LONG TERM GOAL #4   Title Patient will tolerate 5 seconds of single leg stance without loss of balance to improve ability to get in and out of shower safely   Time 12   Period Weeks   Status Partially Met     PT LONG TERM GOAL #5   Title Patient (< 60 years  old) will complete five times sit to stand test in < 10 seconds indicating an increased LE strength and improved balance   Time 12   Period Weeks   Status Partially Met               Plan - Mar 30, 2016 1500    Clinical Impression Statement Pt demonstrates impaired balance and coordination when challenged with uneven surfaces.  She tolerated all interventions well, reporting fatigue at end of resisted gait in each direction.  Pt will benefit from continued skilled PT services to address balance, strength, and coordination.   Rehab Potential Good   PT Frequency 2x / week   PT Duration 12 weeks   PT Treatment/Interventions Therapeutic activities;Therapeutic exercise;Balance training;Neuromuscular re-education;Stair training;Gait training;Patient/family education   PT Next Visit Plan strengthening and balance training; gait training   PT Home  Exercise Plan balance in corner   Consulted and Agree with Plan of Care Patient      Patient will benefit from skilled therapeutic intervention in order to improve the following deficits and impairments:  Abnormal gait, Decreased balance, Decreased endurance, Difficulty walking, Decreased cognition, Decreased safety awareness, Decreased strength, Impaired UE functional use, Obesity  Visit Diagnosis: Muscle weakness (generalized)  Difficulty in walking, not elsewhere classified       G-Codes - 03-30-2016 1405    Functional Assessment Tool Used --   Functional Limitation --   Mobility: Walking and Moving Around Current Status (N1833) --   Mobility: Walking and Moving Around Goal Status (P8251) --      Problem List There are no active problems to display for this patient.    Collie Siad PT, DPT 2016-03-30, 3:54 PM  [place] MAIN [facility] SERVICES [address] [place], [place], [address] Phone: [phone]   Fax:  [phone]  Name: TONJIA PARILLO MRN: [id number] Date of Birth: Jul 26, 1985

## 2016-03-19 NOTE — Therapy (Signed)
[place] MAIN [facility] SERVICES [address] [place], [place], [address] Phone: [phone]   Fax:  [phone]  Speech Language Pathology Treatment/Progress Note  Patient Details  Name: Natasha [name] MRN: [id number] Date of Birth: 06-27-1985 Referring Provider: Barbaraann Boys  Encounter Date: 03/19/2016      End of Session - 03/19/16 1539    Visit Number 10   Number of Visits 20   Date for SLP Re-Evaluation 04/11/16   SLP Start Time 1400   SLP Stop Time  1500   SLP Time Calculation (min) 60 min      Past Medical History:  Diagnosis Date  . Polysubstance abuse   . Restless leg syndrome unk    Past Surgical History:  Procedure Laterality Date  . CHOLECYSTECTOMY    . TUBAL LIGATION      There were no vitals filed for this visit.      Subjective Assessment - 03/19/16 1536    Subjective The patient reports that her mother is looking into a group home for her.  The patient is determined to be a part of the decision making process.               ADULT SLP TREATMENT - 03/19/16 0001      General Information   Behavior/Cognition Alert;Cooperative;Pleasant mood   HPI TBI     Treatment Provided   Treatment provided Cognitive-Linquistic     Pain Assessment   Pain Assessment No/denies pain     Cognitive-Linquistic Treatment   Treatment focused on Cognition;Patient/family/caregiver education   Skilled Treatment READING COMPREHENSION/ABSTRACT LANAGUAGE: The patient completed a logical solutions task: Tricky questions- Patient identified "trick" in the question independently with 30% accuracy, 60% given min cue ("no, that's not right"), and 80% given direct cues ("what is the meaning of survivor").  Errors due to impulsive responses, mis-reading words, reduced flexibility, reduced access to common knowledge, and faulty memory for information.  PROBLEM SOLVING: Generate causes, consequences and solutions given situation:  patient is able to generate a response but requires SLP cues to be complete in her response.     Assessment / Recommendations / Plan   Plan Continue with current plan of care     Progression Toward Goals   Progression toward goals Progressing toward goals          SLP Education - 03/19/16 1538    Education provided Yes   Education Details read slower for better absorption of the information, need to increase flexibility of thought to be a successful problem solver   Person(s) Educated Patient   Methods Explanation   Comprehension Verbalized understanding            SLP Long Term Goals - 03/19/16 1541      SLP LONG TERM GOAL #1   Title Patient will demonstrate functional cognitive-communication skills for independent completion of personal responsibilities.   Time 10   Period Weeks   Status Partially Met     SLP LONG TERM GOAL #2   Title Patient will independently complete complex executive function skills tasks with 80% accuracy.   Time 10   Period Weeks   Status Partially Met     SLP LONG TERM GOAL #3   Title Patient will complete complex attention tasks with 80% accuracy.   Time 10   Period Weeks   Status Partially Met     SLP LONG TERM GOAL #4   Title Patient will complete memory strategy activities with  80% accuracy.   Time 10   Period Weeks     SLP LONG TERM GOAL #5   Title Patient will generate grammatical and cogent sentences to complete abstract/complex linguistic tasks with 80% accuracy   Time 10   Period Weeks   Status Partially Met          Plan - 03/19/16 1540    Clinical Impression Statement The patient demonstrates improved scanning for information in a complex setting and improved perseverance.  The patient is demonstrating behaviors that interfere with reading comprehension and new learning.  Behaviors include: impulsive responses, mis-reading words, reduced flexibility, reduced access to common knowledge, and faulty memory for information.   The patient is taking feedback regarding her responses and attempts to modify her response.   Speech Therapy Frequency 2x / week   Duration Other (comment)   Treatment/Interventions Compensatory techniques;Cognitive reorganization;Internal/external aids;Functional tasks;SLP instruction and feedback;Compensatory strategies;Patient/family education   Potential to Achieve Goals Good   Potential Considerations Ability to learn/carryover information;Cooperation/participation level;Previous level of function;Severity of impairments;Family/community support   SLP Home Exercise Plan generate 1 cause, 1 consequence, and 1 solution given situations   Consulted and Agree with Plan of Care Patient      Patient will benefit from skilled therapeutic intervention in order to improve the following deficits and impairments:   Cognitive communication deficit    Problem List There are no active problems to display for this patient.  Leroy Sea, MS/CCC- SLP  Lou Miner 03/19/2016, 3:43 PM  [place] MAIN [facility] SERVICES [address] [place], [place], [address] Phone: [phone]   Fax:  [phone]   Name: Natasha [name] MRN: [id number] Date of Birth: [birth year]

## 2016-03-24 ENCOUNTER — Ambulatory Visit: Payer: 59 | Admitting: Physical Therapy

## 2016-03-24 ENCOUNTER — Ambulatory Visit: Payer: 59 | Admitting: Speech Pathology

## 2016-03-24 ENCOUNTER — Encounter: Payer: Self-pay | Admitting: Physical Therapy

## 2016-03-24 ENCOUNTER — Encounter: Payer: Self-pay | Admitting: Speech Pathology

## 2016-03-24 ENCOUNTER — Ambulatory Visit: Payer: 59 | Admitting: Occupational Therapy

## 2016-03-24 DIAGNOSIS — R262 Difficulty in walking, not elsewhere classified: Secondary | ICD-10-CM

## 2016-03-24 DIAGNOSIS — M6281 Muscle weakness (generalized): Secondary | ICD-10-CM

## 2016-03-24 DIAGNOSIS — R41841 Cognitive communication deficit: Secondary | ICD-10-CM | POA: Diagnosis not present

## 2016-03-24 DIAGNOSIS — R278 Other lack of coordination: Secondary | ICD-10-CM

## 2016-03-24 NOTE — Therapy (Signed)
[place] MAIN [facility] SERVICES [address] [place], [place], [address] Phone: [phone]   Fax:  [phone]  Physical Therapy Treatment  Patient Details  Name: Natasha [name] MRN: [id number] Date of Birth: [birth year] Referring Provider: Barbaraann Boys  Encounter Date: 03/24/2016      PT End of Session - 03/24/16 1530    Visit Number 18   Number of Visits 26   Date for PT Re-Evaluation 03/26/16   PT Start Time 1500   PT Stop Time 1545   PT Time Calculation (min) 45 min   Equipment Utilized During Treatment Gait belt   Activity Tolerance Patient tolerated treatment well   Behavior During Therapy [facility] [place] for tasks assessed/performed      Past Medical History:  Diagnosis Date  . Polysubstance abuse   . Restless leg syndrome unk    Past Surgical History:  Procedure Laterality Date  . CHOLECYSTECTOMY    . TUBAL LIGATION      There were no vitals filed for this visit.      Subjective Assessment - 03/24/16 1517    Subjective Pt denies any pain and reports no new changes since last session.  She has no questions or concerns at this time.   Pertinent History TBI   Patient Stated Goals to be able to walk without the loftstrand crutches   Pain Onset Today          NEUROMUSCULAR RE-EDUCATION BLE staggered stance anterior/posterior weight shifting on small rockerboard; CGA, increased difficulty and more unsteadiness with RLE fwd; min cues to increase weight shift over RLE BLE NBOS stance on airex pad with no UE and EC, 5x15 sec  BLE staggered stance with front foot on dynadisc and balloon taps against mirror x 5 min; CGA, increased hip flexion to maintain balance, more difficulty with LLE fwd Fwd/retro tandem walk on long airex with no UE support x 12 laps; mod verbal cues to decrease UE support, decreased step length with R throughout, mod verbal cues to increase R step length; CGA - min A for steadiness throughout Bil side  stepping on long airex with no UE support x 5 laps; increased difficulty with side stepping R, CGA for steadiness  Patient needs occasional verbal cueing to improve posture and cueing to correctly perform exercises slowly, holding at end of range to increase motor firing of desired muscle to encourage fatigue.                         PT Education - 03/24/16 1530    Education provided Yes   Education Details HEP for strengthening   Person(s) Educated Patient   Methods Explanation   Comprehension Verbalized understanding             PT Long Term Goals - 03/12/16 1756      PT LONG TERM GOAL #1   Title Patient will reduce timed up and go to <11 seconds to reduce fall risk and demonstrate improved transfer/gait ability.   Time 12   Period Weeks   Status Partially Met     PT LONG TERM GOAL #2   Title Patient will increase six minute walk test distance to >1000 for progression to community ambulator and improve gait ability   Time 12   Period Weeks   Status Partially Met     PT LONG TERM GOAL #3   Title Patient will increase 10 meter walk test to >1.55ms as to  improve gait speed for better community ambulation and to reduce fall    Time 12   Period Weeks   Status Partially Met     PT LONG TERM GOAL #4   Title Patient will tolerate 5 seconds of single leg stance without loss of balance to improve ability to get in and out of shower safely   Time 12   Period Weeks   Status Partially Met     PT LONG TERM GOAL #5   Title Patient (< 73 years old) will complete five times sit to stand test in < 10 seconds indicating an increased LE strength and improved balance   Time 12   Period Weeks   Status Partially Met               Plan - 03/24/16 1530    Clinical Impression Statement Patient has decreased BLE strength and decreased static and dynamic standing balance and is able to perform activities and exercises to improve dynamic and static standing balance  and for strengthening core and LE without pain behaviors.   Rehab Potential Good   PT Frequency 2x / week   PT Duration 12 weeks   PT Treatment/Interventions Therapeutic activities;Therapeutic exercise;Balance training;Neuromuscular re-education;Stair training;Gait training;Patient/family education   PT Next Visit Plan strengthening and balance training; gait training   PT Home Exercise Plan balance in corner   Consulted and Agree with Plan of Care Patient      Patient will benefit from skilled therapeutic intervention in order to improve the following deficits and impairments:  Abnormal gait, Decreased balance, Decreased endurance, Difficulty walking, Decreased cognition, Decreased safety awareness, Decreased strength, Impaired UE functional use, Obesity  Visit Diagnosis: Other lack of coordination  Muscle weakness (generalized)  Difficulty in walking, not elsewhere classified     Problem List There are no active problems to display for this patient.  Alanson Puls, PT, DPT [place], Minette [place] S 03/24/2016, 3:32 PM  [place] MAIN [facility] SERVICES [address] [place], [place], [address] Phone: [phone]   Fax:  [phone]  Name: Natasha [name] MRN: [id number] Date of Birth: April 08, 1986

## 2016-03-24 NOTE — Patient Instructions (Signed)
OT TREATMENT    Neuro muscular re-education:  [facility]. [facility] improving [facility] skills. Pt. worked on grasping 1/8" beads from a small dish, and placing them on a designated surface.  Pt. worked on removing the beads alternating thumb opposition to the tip of the 2nd through 5th digits.  Selfcare:  Pt. worked on Emergency planning/management officercalculating mathematical equations needed for calculating monthly bills. Pt. requires cues for the method of subtracting larger number amounts, and borrowing.

## 2016-03-24 NOTE — Therapy (Signed)
[place] MAIN [facility] SERVICES [address] [place], [place], [address] Phone: [phone]   Fax:  [phone]  Occupational Therapy Treatment  Patient Details  Name: Natasha [name] MRN: [id number] Date of Birth: 01-05-1986 No Data Recorded  Encounter Date: 03/24/2016      OT End of Session - 03/24/16 1312    Visit Number 17   Number of Visits 20   Date for OT Re-Evaluation 03/26/16   Authorization Type 23 visits per calendar year   OT Start Time 1300   OT Stop Time 1347   OT Time Calculation (min) 47 min   Activity Tolerance Patient tolerated treatment well   Behavior During Therapy [facility] for tasks assessed/performed      Past Medical History:  Diagnosis Date  . Polysubstance abuse   . Restless leg syndrome unk    Past Surgical History:  Procedure Laterality Date  . CHOLECYSTECTOMY    . TUBAL LIGATION      There were no vitals filed for this visit.      Subjective Assessment - 03/24/16 1309    Subjective  Pt. reports her mother is waiting for resources to arrive in the mail for Group Homes.   Patient is accompained by: Family member      OT TREATMENT    Neuro muscular re-education:  Pt. worked on improving [facility] skills. Pt. worked on grasping 1/8" beads from a small dish, and placing them on a designated surface.  Pt. worked on removing the beads alternating thumb opposition to the tip of the 2nd through 5th digits.  Selfcare:  Pt. worked on Office manager equations needed for calculating monthly bills. Pt. requires cues for the method of subtracting larger number amounts, and borrowing.                              OT Education - 03/24/16 1312    Education provided Yes   Education Details IADLs   Person(s) Educated Patient   Methods Demonstration;Explanation   Comprehension Verbalized understanding;Returned demonstration;Verbal cues required             OT Long Term  Goals - 02/21/16 1440      OT LONG TERM GOAL #1   Title Patient will improve L UE strength by 1 mm grade to complete IADL tasks with modified independence.     Baseline LUE shoulder strength 4/5, elbow flexion, extension 4+/5, wrist 4=/5   Time 12   Period Weeks   Status On-going     OT LONG TERM GOAL #2   Title Patient will complete laundry tasks with supervision.   Time 12   Period Weeks   Status Achieved     OT LONG TERM GOAL #3   Title Patient will pour tea from a pitcher without spillage   Time 12   Period Weeks   Status Achieved     OT LONG TERM GOAL #4   Title Patient will demonstrate the ability to transport snack items from the kitchen to the den without spilling items.    Time 12   Period Weeks   Status Achieved     OT LONG TERM GOAL #5   Title Patient will demonstrate the ability to choose her own clothing each day, matching 100% of the time.   Baseline mom has to perform.   Time 12   Period Weeks   Status Achieved     OT  LONG TERM GOAL #6   Title Pt. will improve right [facility] skills needed for preparing ingredients for meal preparation.   Baseline Pt. has difficulty opening snack packets and container lids   Time 12   Period Weeks   Status On-going     OT LONG TERM GOAL #7   Title Pt. will independently plan meals and menus for one week in preparation for creating a weekly shopping list.   Baseline Pt. currently has difficulty with meal planning, and creating shopping lists.   Time 12   Period Weeks   Status Partially Met     OT LONG TERM GOAL #8   Title Pt. will require supervision vacuuming while using compensatory strategies.   Baseline Pt. is unable to vacuuming   Time 12   Period Weeks   Status Achieved     OT LONG TERM GOAL  #9   Baseline Pt. will independently demonstrate organization and placement of items in the refrigerator for easier access.   Time 12   Period Weeks   Status Achieved     OT LONG TERM GOAL  #10   TITLE Pt. will  independently calculate, manage, and simulate monthly bill management.      Baseline unable    Time 12   Period Weeks   Status Revised     OT LONG TERM GOAL  #11   TITLE Pt. will independently be able to put jewelry including fastening clasps.   Baseline difficulty   Time 12   Period Weeks   Status Achieved     OT LONG TERM GOAL  #12   TITLE Pt. will prepare and cook a complex, multiple step meal with supervision.    Baseline Pt. is able to cook a simple one step meal with S-CGA.   Time 12   Period Weeks   Status On-going     OT LONG TERM GOAL  #13   TITLE Pt. will independently decipher, and fill out various types of complex schedules with 100% accuracy.   Baseline Pt. able to fill out simple schedules   Time 12   Period Weeks   Status New               Plan - 03/24/16 1313    Clinical Impression Statement Pt. continues to work on improving [facility] skills needed for ADL, and IADL tasks.  Pt. has difficulty grasping 1/8" objects with her right thumb and digits. Pt. continues to benefit from skilled OT services to work on improving [facility] skills, meal preparation,  functional math for monthly bill management, and home management tasks in order to improve ADL and IADL functioning.   Rehab Potential Good   OT Frequency 2x / week   OT Duration 12 weeks   OT Treatment/Interventions Self-care/ADL training;Therapeutic exercise;Moist Heat;Neuromuscular education;DME and/or AE instruction;Therapeutic activities;Patient/family education;Cognitive remediation/compensation   Consulted and Agree with Plan of Care Patient      Patient will benefit from skilled therapeutic intervention in order to improve the following deficits and impairments:  Decreased cognition, Decreased knowledge of use of DME, Decreased coordination, Decreased mobility, Decreased endurance, Decreased strength, Decreased balance, Decreased safety awareness, Decreased knowledge of precautions, Impaired UE functional  use  Visit Diagnosis: Other lack of coordination    Problem List There are no active problems to display for this patient.   [name], MS, OTR/L 03/24/2016, 1:55 PM  [place] MAIN [facility] SERVICES [address] [place], [place], [address] Phone: [phone]  Fax:  [phone]  Name: Natasha [name] MRN: [id number] Date of Birth: [birth year]

## 2016-03-24 NOTE — Therapy (Signed)
[place] MAIN [facility] SERVICES [address] [place], [place], [address] Phone: [phone]   Fax:  [phone]  Speech Language Pathology Treatment  Patient Details  Name: Natasha [name] MRN: [id number] Date of Birth: 03/13/1986 Referring Provider: Barbaraann Boys  Encounter Date: 03/24/2016      End of Session - 03/24/16 1546    Visit Number 11   Number of Visits 20   Date for SLP Re-Evaluation 04/11/16   SLP Start Time 27   SLP Stop Time  1500   SLP Time Calculation (min) 60 min   Activity Tolerance Patient tolerated treatment well      Past Medical History:  Diagnosis Date  . Polysubstance abuse   . Restless leg syndrome unk    Past Surgical History:  Procedure Laterality Date  . CHOLECYSTECTOMY    . TUBAL LIGATION      There were no vitals filed for this visit.      Subjective Assessment - 03/24/16 1545    Subjective The patient reports that her mother is looking into a group home for her.  The patient is determined to be a part of the decision making process.   Currently in Pain? No/denies               ADULT SLP TREATMENT - 03/24/16 0001      General Information   Behavior/Cognition Alert;Cooperative;Pleasant mood   HPI TBI     Treatment Provided   Treatment provided Cognitive-Linquistic     Pain Assessment   Pain Assessment No/denies pain     Cognitive-Linquistic Treatment   Treatment focused on Cognition;Patient/family/caregiver education   Skilled Treatment PROBLEM-SOLVING: The patient had completed a homework assignment from last week. She correctly provided a logical cause, consequence, and solution for 89% of one-sentence scenarios. The clinician pointed out the lack of capitalization and punctuation in the patient's responses and noted that punctuation is a worthwhile area to target to promote clearer and more professional communication. ATTENTION, PROBLEM-SOLVING, AND READING COMPREHENSION:  The patient was provided with 3 articles related to careers of interest. She independently identified 13 pros and 4 cons to a career based on article content. The patient underlined key information in a second article describing several related careers. Based on this key information, the patient identified how much education was required for the various careers described in the article. She could not recall this information and required frequent cues to refer back to the underlined sections of the article in order to report it. The clinician ordered the careers on a spectrum from least to most education required - patient had difficulty synthesizing or summarizing this information without visual aid. Finally, with min-mod cues, the patient decided upon two search terms for internet search regarding another career cluster of interest. She perused search results to identify results that were most pertinent to providing an overview of careers in that field.  The patient required moderate support to help her skim visual information for key content prior to reading in depth.     Assessment / Recommendations / Plan   Plan Continue with current plan of care     Progression Toward Goals   Progression toward goals Progressing toward goals          SLP Education - 03/24/16 1545    Education provided Yes   Education Details Using headings and bullets to visually skim written information for sections of interest.   Person(s) Educated Patient   Methods Demonstration;Explanation  Comprehension Verbalized understanding            SLP Long Term Goals - 03/19/16 1541      SLP LONG TERM GOAL #1   Title Patient will demonstrate functional cognitive-communication skills for independent completion of personal responsibilities.   Time 10   Period Weeks   Status Partially Met     SLP LONG TERM GOAL #2   Title Patient will independently complete complex executive function skills tasks with 80% accuracy.    Time 10   Period Weeks   Status Partially Met     SLP LONG TERM GOAL #3   Title Patient will complete complex attention tasks with 80% accuracy.   Time 10   Period Weeks   Status Partially Met     SLP LONG TERM GOAL #4   Title Patient will complete memory strategy activities with 80% accuracy.   Time 10   Period Weeks     SLP LONG TERM GOAL #5   Title Patient will generate grammatical and cogent sentences to complete abstract/complex linguistic tasks with 80% accuracy   Time 10   Period Weeks   Status Partially Met          Plan - 03/24/16 1546    Clinical Impression Statement The patient demonstrates improved scanning for information in a complex setting and improved perseverance.  Today the patient demonstrated some faulty memory for information. She required moderate cues to use written information to complete novel task (identify pros/cons, identify key information).   Speech Therapy Frequency 2x / week   Duration Other (comment) 10 weeks   Treatment/Interventions Compensatory techniques;Cognitive reorganization;Internal/external aids;Functional tasks;SLP instruction and feedback;Compensatory strategies;Patient/family education   Potential to Achieve Goals Good   Potential Considerations Ability to learn/carryover information;Cooperation/participation level;Previous level of function;Severity of impairments;Family/community support   SLP Home Exercise Plan Read article regarding jobs in hospitality and tourism, select 2-3 jobs of greatest interest, create [employer] for selected jobs.    Consulted and Agree with Plan of Care Patient      Patient will benefit from skilled therapeutic intervention in order to improve the following deficits and impairments:   Cognitive communication deficit    Problem List There are no active problems to display for this patient.   Rickard Rhymes  Graduate Student Clinician 03/24/2016, 3:49 PM  [place] MAIN [facility] SERVICES [address] [place], [place], [address] Phone: [phone]   Fax:  [phone]   Name: Natasha [name] MRN: [id number] Date of Birth: Jun 29, 1985

## 2016-03-26 ENCOUNTER — Encounter: Payer: Self-pay | Admitting: Physical Therapy

## 2016-03-26 ENCOUNTER — Ambulatory Visit: Payer: 59 | Admitting: Physical Therapy

## 2016-03-26 ENCOUNTER — Encounter: Payer: Self-pay | Admitting: Speech Pathology

## 2016-03-26 ENCOUNTER — Ambulatory Visit: Payer: 59 | Admitting: Speech Pathology

## 2016-03-26 ENCOUNTER — Ambulatory Visit: Payer: 59 | Admitting: Occupational Therapy

## 2016-03-26 DIAGNOSIS — R262 Difficulty in walking, not elsewhere classified: Secondary | ICD-10-CM

## 2016-03-26 DIAGNOSIS — M6281 Muscle weakness (generalized): Secondary | ICD-10-CM

## 2016-03-26 DIAGNOSIS — R41841 Cognitive communication deficit: Secondary | ICD-10-CM

## 2016-03-26 DIAGNOSIS — R278 Other lack of coordination: Secondary | ICD-10-CM

## 2016-03-26 NOTE — Therapy (Signed)
[place] MAIN [facility] SERVICES [address] [place], [place], [address] Phone: [phone]   Fax:  [phone]  Speech Language Pathology Treatment  Patient Details  Name: Natasha [name] MRN: [id number] Date of Birth: 24-Jul-1985 Referring Provider: Barbaraann Boys  Encounter Date: 03/26/2016      End of Session - 03/26/16 1744    Visit Number 12   Number of Visits 20   Date for SLP Re-Evaluation 04/11/16   SLP Start Time 8828   SLP Stop Time  1500   SLP Time Calculation (min) 55 min   Activity Tolerance Patient tolerated treatment well      Past Medical History:  Diagnosis Date  . Polysubstance abuse   . Restless leg syndrome unk    Past Surgical History:  Procedure Laterality Date  . CHOLECYSTECTOMY    . TUBAL LIGATION      There were no vitals filed for this visit.      Subjective Assessment - 03/26/16 1742    Subjective The patient reported that she did not complete her homework due to "depressed" feelings over family's excluding her from planning/deciding for group home placement.   Currently in Pain? No/denies               ADULT SLP TREATMENT - 03/26/16 0001      General Information   Behavior/Cognition Alert;Cooperative;Pleasant mood   HPI TBI     Treatment Provided   Treatment provided Cognitive-Linquistic     Pain Assessment   Pain Assessment No/denies pain     Cognitive-Linquistic Treatment   Treatment focused on Cognition;Patient/family/caregiver education   Skilled Treatment The patient reported feelings of anxiety and sadness over mother and sister's excluding her in planning for residential group home placement. The clinician and patient collaboratively problem-solved ways to improve communication and participation in this process with family members and reviewed resources listed in the patient's binder. The clinician read paragraph-length stories aloud, and the patient identified main ideas  from multiple choice field of 3 on 86% of trials. She independently generated a summary of the main idea in 83% of trials. The patient completed a process-of-elimination word puzzle with only one verbal cue, demonstrating good comprehension, planning, and attention.      Assessment / Recommendations / Plan   Plan Continue with current plan of care     Progression Toward Goals   Progression toward goals Progressing toward goals          SLP Education - 03/26/16 1743    Education provided Yes   Education Details Writing while listening may be distracting; listen and then jot dot main points   Person(s) Educated Patient   Methods Explanation   Comprehension Verbalized understanding            SLP Long Term Goals - 03/19/16 1541      SLP LONG TERM GOAL #1   Title Patient will demonstrate functional cognitive-communication skills for independent completion of personal responsibilities.   Time 10   Period Weeks   Status Partially Met     SLP LONG TERM GOAL #2   Title Patient will independently complete complex executive function skills tasks with 80% accuracy.   Time 10   Period Weeks   Status Partially Met     SLP LONG TERM GOAL #3   Title Patient will complete complex attention tasks with 80% accuracy.   Time 10   Period Weeks   Status Partially Met  SLP LONG TERM GOAL #4   Title Patient will complete memory strategy activities with 80% accuracy.   Time 10   Period Weeks     SLP LONG TERM GOAL #5   Title Patient will generate grammatical and cogent sentences to complete abstract/complex linguistic tasks with 80% accuracy   Time 10   Period Weeks   Status Partially Met          Plan - 03/26/16 1747    Clinical Impression Statement The patient demonstrated good working and short-term memory for auditory memory/comprehension tasks of a paragraph in length. She demonstrated strong ability to identify and summarize main points. The patient remembered and  independently implemented strategies for process-of-elimination word puzzle.    Speech Therapy Frequency 2x / week   Duration Other (comment)   Treatment/Interventions Compensatory techniques;Cognitive reorganization;Internal/external aids;Functional tasks;SLP instruction and feedback;Compensatory strategies;Patient/family education   Potential to Achieve Goals Good   Potential Considerations Ability to learn/carryover information;Cooperation/participation level;Previous level of function;Severity of impairments;Family/community support   SLP Home Exercise Plan Complete additional word puzzle; refer to notebook and implement plan for communication with family members and accessing resources regarding group home   Consulted and Agree with Plan of Care Patient      Patient will benefit from skilled therapeutic intervention in order to improve the following deficits and impairments:   Cognitive communication deficit    Problem List There are no active problems to display for this patient.   Rickard Rhymes  Graduate Student Clinician 03/26/2016, 5:48 PM  [place] MAIN [facility] SERVICES [address] [place], [place], [address] Phone: [phone]   Fax:  [phone]   Name: Natasha [name] MRN: [id number] Date of Birth: 1985-08-24
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
## 2016-03-26 NOTE — Addendum Note (Signed)
Addended by: Avon GullyJAGENTENFL, Delford Wingert M on: 03/26/2016 01:57 PM   Modules accepted: Orders

## 2016-03-26 NOTE — Patient Instructions (Signed)
OT TREATMENT    Pt. Goals  were reviewed and updated with pt.  Neuro muscular re-education:  Pt. performed [facility] tasks using the Grooved pegboard. Pt. worked on grasping the grooved pegs from a horizontal position, and moving the pegs to a vertical position in the hand to prepare for placing them in the grooved slot. Pt. performed [facility] skills training to improve speed and dexterity needed for ADL tasks and writing. Pt. demonstrated grasping 1 inch sticks,  inch cylindrical collars, and  inch flat washers on the Purdue pegboard. Pt. performed grasping each item with her 2nd digit and thumb, and storing them in the palm. Pt. presented with difficulty storing  inch objects at a time in the palmar aspect of the hand.

## 2016-03-26 NOTE — Therapy (Signed)
[place] MAIN [facility] SERVICES [address] [place], [place], [address] Phone: [phone]   Fax:  [phone]  Physical Therapy Treatment  Patient Details  Name: Natasha [name] MRN: [id number] Date of Birth: Apr 27, 1986 Referring Provider: Barbaraann Boys  Encounter Date: 03/26/2016      PT End of Session - 03/26/16 1508    Visit Number 19   Number of Visits 26   Date for PT Re-Evaluation 03/26/16   PT Start Time 0300   PT Stop Time 0345   PT Time Calculation (min) 45 min   Equipment Utilized During Treatment Gait belt   Activity Tolerance Patient tolerated treatment well   Behavior During Therapy [facility] for tasks assessed/performed      Past Medical History:  Diagnosis Date  . Polysubstance abuse   . Restless leg syndrome unk    Past Surgical History:  Procedure Laterality Date  . CHOLECYSTECTOMY    . TUBAL LIGATION      There were no vitals filed for this visit.      Subjective Assessment - 03/26/16 1506    Subjective Pt denies any pain and reports no new changes since last session.  She has no questions or concerns at this time.   Pertinent History TBI   Patient Stated Goals to be able to walk without the loftstrand crutches   Pain Onset Today      OUTCOME MEASURES: TEST Outcome Interpretation  5 times sit<>stand 11.61sec >60 yo, >15 sec indicates increased risk for falls  10 meter walk test    .96             m/s <1.0 m/s indicates increased risk for falls; limited community ambulator  Timed up and Go    13.51             sec <14 sec indicates increased risk for falls  6 minute walk test  790              Feet 1000 feet is community ambulator          Therapeutic exercise and neuromuscular training: 1/2 foam flat side up and balance with head turns left and right feet apart and feet together,  tandem standing on 1/2 foam   Tilt board fwd/bwd, side to side left and right Matrix fwd/bwd x 5 Patient needs  occasional verbal cueing to improve posture and cueing to correctly perform exercises slowly,She needs CGA for dynamic standing balance exercises. She has some loss of balance  with high level balance exercises.                         PT Education - 03/26/16 1506    Education provided Yes   Education Details HEP   Person(s) Educated Patient   Methods Explanation   Comprehension Verbalized understanding             PT Long Term Goals - 03/26/16 1611      PT LONG TERM GOAL #1   Title Patient will reduce timed up and go to <11 seconds to reduce fall risk and demonstrate improved transfer/gait ability.   Time 12   Period Weeks   Status Partially Met     PT LONG TERM GOAL #2   Title Patient will increase six minute walk test distance to >1000 for progression to community ambulator and improve gait ability   Time 12   Period Weeks   Status Partially Met  PT LONG TERM GOAL #3   Title Patient will increase 10 meter walk test to >1.56ms as to improve gait speed for better community ambulation and to reduce fall    Time 12   Status Partially Met     PT LONG TERM GOAL #4   Title Patient will tolerate 5 seconds of single leg stance without loss of balance to improve ability to get in and out of shower safely   Time 12   Status Partially Met     PT LONG TERM GOAL #5   Title Patient (< 625years old) will complete five times sit to stand test in < 10 seconds indicating an increased LE strength and improved balance   Time 12   Status Partially Met               Plan - 03/26/16 1509    Clinical Impression Statement Pt demonstrates increased postural sway when standing on uneven surface and requires // bars to steady, and demonstrates fatigue at end of set of exercises focused on strength and endurance.  Patient will continue to benefit from skilled PT for improved balance and strength. Her outcome measures are improving and her falls risk is decreasing.     Rehab Potential Good   PT Frequency 2x / week   PT Duration 12 weeks   PT Treatment/Interventions Therapeutic activities;Therapeutic exercise;Balance training;Neuromuscular re-education;Stair training;Gait training;Patient/family education   PT Next Visit Plan strengthening and balance training; gait training   PT Home Exercise Plan balance in corner   Consulted and Agree with Plan of Care Patient      Patient will benefit from skilled therapeutic intervention in order to improve the following deficits and impairments:  Abnormal gait, Decreased balance, Decreased endurance, Difficulty walking, Decreased cognition, Decreased safety awareness, Decreased strength, Impaired UE functional use, Obesity  Visit Diagnosis: Muscle weakness (generalized)  Difficulty in walking, not elsewhere classified     Problem List There are no active problems to display for this patient. KAlanson Puls PT, DPT  [place] KMinette HeadlandS 03/26/2016, 4:17 PM  [place] [facility] CenterSERVICES [address] [address]Phone: [phone]  Fax:  [phone] Name: Natasha LINZYMRN: [id number] of Birth: [birth year]

## 2016-03-31 ENCOUNTER — Encounter: Payer: Self-pay | Admitting: Physical Therapy

## 2016-03-31 ENCOUNTER — Ambulatory Visit: Payer: 59 | Admitting: Physical Therapy

## 2016-03-31 ENCOUNTER — Ambulatory Visit: Payer: 59 | Attending: Pediatrics | Admitting: Occupational Therapy

## 2016-03-31 DIAGNOSIS — R278 Other lack of coordination: Secondary | ICD-10-CM | POA: Diagnosis present

## 2016-03-31 DIAGNOSIS — M6281 Muscle weakness (generalized): Secondary | ICD-10-CM

## 2016-03-31 DIAGNOSIS — R262 Difficulty in walking, not elsewhere classified: Secondary | ICD-10-CM | POA: Insufficient documentation

## 2016-03-31 DIAGNOSIS — R41841 Cognitive communication deficit: Secondary | ICD-10-CM | POA: Insufficient documentation

## 2016-03-31 NOTE — Patient Instructions (Signed)
OT TREATMENT    Neuro muscular re-education:  Pt. worked on tasks to sustain lateral pinch on resistive tweezers while grasping and moving 2" toothpick sticks from a horizontal flat position to a vertical position in order to place it in the holder. Pt. was able to sustain grasp while positioning and extending the wrist/hand in the necessary alignment needed to place the stick through the top of the holder. Pt. worked on manipulating cards, turning cards, and stacking cards in groups of 21. Pt. required cues initially for finding varieties of   Pt. worked on grasping, turning, flipping, and stacking [place] style pegs with emphasis placed on increasing speed and accuracy.

## 2016-03-31 NOTE — Therapy (Signed)
[place] MAIN [facility] SERVICES [address] [place], [place], [address] Phone: [phone]   Fax:  [phone]  Physical Therapy Treatment  Patient Details  Name: Natasha [name] MRN: [id number] Date of Birth: 07/12/1985 Referring Provider: Barbaraann Boys  Encounter Date: 03/31/2016      PT End of Session - 03/31/16 1627    Visit Number 20   Number of Visits 26   Date for PT Re-Evaluation 03/26/16   PT Start Time 0400   PT Stop Time 0445   PT Time Calculation (min) 45 min   Equipment Utilized During Treatment Gait belt   Activity Tolerance Patient tolerated treatment well   Behavior During Therapy [facility] for tasks assessed/performed      Past Medical History:  Diagnosis Date  . Polysubstance abuse   . Restless leg syndrome unk    Past Surgical History:  Procedure Laterality Date  . CHOLECYSTECTOMY    . TUBAL LIGATION      There were no vitals filed for this visit.      Subjective Assessment - 03/31/16 1626    Subjective Pt denies any pain and reports no new changes since last session.  She has no questions or concerns at this time.   Pertinent History TBI   Patient Stated Goals to be able to walk without the loftstrand crutches   Currently in Pain? No/denies   Pain Score 0-No pain   Pain Onset Today      OUTCOME MEASURES: TEST  Outcome  Interpretation   5 times sit<>stand  12.22sec  >33 yo, >15 sec indicates increased risk for falls   10 meter walk test      . 87        m/s  <1.0 m/s indicates increased risk for falls; limited community ambulator   Timed up and Go   13.85             sec  <14 sec indicates increased risk for falls   6 minute walk test       850        Feet  1000 feet is community ambulator    NEUROMUSCULAR RE-EDUCATION Airex NBOS eyes open x 30 seconds each; Airex NBOS eyes open horizontal and vertical head turns x 30 seconds; 1/2 foam standing  Patient is improving with TUG and 6 MW test and will  continue to benefit from skilled PT to reach goals of ambulation without AD.                              PT Education - 03/31/16 1627    Education provided Yes   Education Details HEP   Person(s) Educated Patient   Methods Explanation   Comprehension Verbalized understanding             PT Long Term Goals - 03/26/16 1611      PT LONG TERM GOAL #1   Title Patient will reduce timed up and go to <11 seconds to reduce fall risk and demonstrate improved transfer/gait ability.   Time 12   Period Weeks   Status Partially Met     PT LONG TERM GOAL #2   Title Patient will increase six minute walk test distance to >1000 for progression to community ambulator and improve gait ability   Time 12   Period Weeks   Status Partially Met     PT LONG TERM GOAL #3  Title Patient will increase 10 meter walk test to >1.77ms as to improve gait speed for better community ambulation and to reduce fall    Time 12   Status Partially Met     PT LONG TERM GOAL #4   Title Patient will tolerate 5 seconds of single leg stance without loss of balance to improve ability to get in and out of shower safely   Time 12   Status Partially Met     PT LONG TERM GOAL #5   Title Patient (< 651years old) will complete five times sit to stand test in < 10 seconds indicating an increased LE strength and improved balance   Time 12   Status Partially Met               Plan - 03/31/16 1628    Clinical Impression Statement Patient has wide base of support during gait and has slow gait speed. She is improving in her outcome measures and will continue to benefit from skilled PT.    Rehab Potential Good   PT Frequency 2x / week   PT Duration 12 weeks   PT Treatment/Interventions Therapeutic activities;Therapeutic exercise;Balance training;Neuromuscular re-education;Stair training;Gait training;Patient/family education   PT Next Visit Plan strengthening and balance training; gait  training   PT Home Exercise Plan balance in corner   Consulted and Agree with Plan of Care Patient      Patient will benefit from skilled therapeutic intervention in order to improve the following deficits and impairments:  Abnormal gait, Decreased balance, Decreased endurance, Difficulty walking, Decreased cognition, Decreased safety awareness, Decreased strength, Impaired UE functional use, Obesity  Visit Diagnosis: Muscle weakness (generalized)  Other lack of coordination  Difficulty in walking, not elsewhere classified     Problem List There are no active problems to display for this patient.  KAlanson Puls PT, DPT MMul[place] KMinette HeadlandS 03/31/2016, 4:29 PM  [place]MAIN RMuscog[facility] (Creek) [facility] CenterSERVICES [address] [address]Phone: [phone]  Fax:  [phone] Name: WERANDI LEMMAMRN: [id number] of Birth: 505-02-1986
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
## 2016-04-02 ENCOUNTER — Encounter: Payer: Self-pay | Admitting: Physical Therapy

## 2016-04-02 ENCOUNTER — Encounter: Payer: Self-pay | Admitting: Occupational Therapy

## 2016-04-02 ENCOUNTER — Ambulatory Visit: Payer: 59 | Admitting: Occupational Therapy

## 2016-04-02 ENCOUNTER — Ambulatory Visit: Payer: 59 | Admitting: Physical Therapy

## 2016-04-02 DIAGNOSIS — R262 Difficulty in walking, not elsewhere classified: Secondary | ICD-10-CM

## 2016-04-02 DIAGNOSIS — M6281 Muscle weakness (generalized): Secondary | ICD-10-CM

## 2016-04-02 DIAGNOSIS — R278 Other lack of coordination: Secondary | ICD-10-CM
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
## 2016-04-02 NOTE — Patient Instructions (Addendum)
OT TREATMENT    Neuro muscular re-education:  Pt. Worked on grasping, shuffling, and flipping cards. Pt. worked on adding and subtracting amounts using the cards.  Selfcare:  Pt. worked on fixing glasses with an extra small screw, and screw driver.

## 2016-04-02 NOTE — Therapy (Signed)
[employer] MAIN [facility] SERVICES [address] [place], [place], [address] Phone: [phone]   Fax:  [phone]  Physical Therapy Treatment  Patient Details  Name: Natasha [name] MRN: [id number] Date of Birth: 06-11-1985 Referring Provider: Barbaraann Boys  Encounter Date: 04/02/2016      PT End of Session - 04/02/16 1600    Visit Number 21   Number of Visits 26   Date for PT Re-Evaluation 03/26/16   PT Start Time 0400   PT Stop Time 0445   PT Time Calculation (min) 45 min   Equipment Utilized During Treatment Gait belt   Activity Tolerance Patient tolerated treatment well   Behavior During Therapy [facility] for tasks assessed/performed      Past Medical History:  Diagnosis Date  . Polysubstance abuse   . Restless leg syndrome unk    Past Surgical History:  Procedure Laterality Date  . CHOLECYSTECTOMY    . TUBAL LIGATION      There were no vitals filed for this visit.      Subjective Assessment - 04/02/16 1559    Subjective Patient is doing well today no new changes.   Currently in Pain? No/denies   Pain Score 0-No pain   Multiple Pain Sites No           Therapeutic exercise: Nu step L3 x 5 minutes for warm-up during history (5 minutes unbilled); Quantum double leg press 100 x 10 x 3 Heel raises with UE support RTB side stepping in // bars 4 lengths x 2; Sit to stand without UE support RTB around knees to prevent valgus 2 x 10 1/2 foam flat side up and balance with head turns left and right feet apart and feet together,  standing hip abd with YTB x 20   side stepping left and right in parallel bars 10 feet x 3 step ups from floor to 6 inch stool x 20 bilateral marching in parallel bars x 20 Tilt board fwd/bwd, side to side left and right Matrix fwd/bwd/side stepping x 3 Patient needs occasional verbal cueing to improve posture and cueing to correctly perform exercises slowly, holding at end of range to increase  motor firing of desired muscle to encourage fatigue.                       PT Education - 04/02/16 1559    Education provided Yes   Education Details safety with ambulation   Person(s) Educated Patient   Methods Explanation   Comprehension Verbalized understanding             PT Long Term Goals - 03/31/16 1645      PT LONG TERM GOAL #1   Title Patient will reduce timed up and go to <11 seconds to reduce fall risk and demonstrate improved transfer/gait ability.   Time 12   Period Weeks   Status Partially Met     PT LONG TERM GOAL #2   Title Patient will increase six minute walk test distance to >1000 for progression to community ambulator and improve gait ability   Time 12   Period Weeks   Status Partially Met     PT LONG TERM GOAL #3   Title Patient will increase 10 meter walk test to >1.56ms as to improve gait speed for better community ambulation and to reduce fall    Time 12   Period Weeks   Status Partially Met     PT  LONG TERM GOAL #4   Title Patient will tolerate 5 seconds of single leg stance without loss of balance to improve ability to get in and out of shower safely   Time 12   Status Partially Met     PT LONG TERM GOAL #5   Title Patient (< 30 years old) will complete five times sit to stand test in < 10 seconds indicating an increased LE strength and improved balance   Time 12   Status Partially Met               Plan - 04/02/16 1600    Clinical Impression Statement Patient has decreased coordination with BLE during stepping and walking and continues to have decreased  dynamic standing balance .    Rehab Potential Good   PT Frequency 2x / week   PT Duration 12 weeks   PT Treatment/Interventions Therapeutic activities;Therapeutic exercise;Balance training;Neuromuscular re-education;Stair training;Gait training;Patient/family education   PT Next Visit Plan strengthening and balance training; gait training   PT Home Exercise  Plan balance in corner   Consulted and Agree with Plan of Care Patient      Patient will benefit from skilled therapeutic intervention in order to improve the following deficits and impairments:  Abnormal gait, Decreased balance, Decreased endurance, Difficulty walking, Decreased cognition, Decreased safety awareness, Decreased strength, Impaired UE functional use, Obesity  Visit Diagnosis: Muscle weakness (generalized)  Other lack of coordination  Difficulty in walking, not elsewhere classified     Problem List There are no active problems to display for this patient.   Alanson Puls 04/02/2016, 4:02 PM  [place] MAIN [facility] SERVICES [address] [place], [place], [address] Phone: [phone]   Fax:  [phone]  Name: MAREESA GATHRIGHT MRN: [id number] Date of Birth: 09-Nov-1985

## 2016-04-07 ENCOUNTER — Ambulatory Visit: Payer: 59 | Admitting: Occupational Therapy

## 2016-04-07 ENCOUNTER — Encounter: Payer: Self-pay | Admitting: Physical Therapy

## 2016-04-07 ENCOUNTER — Ambulatory Visit: Payer: 59 | Admitting: Physical Therapy

## 2016-04-07 DIAGNOSIS — R278 Other lack of coordination: Secondary | ICD-10-CM

## 2016-04-07 DIAGNOSIS — M6281 Muscle weakness (generalized): Secondary | ICD-10-CM

## 2016-04-07 DIAGNOSIS — R262 Difficulty in walking, not elsewhere classified: Secondary | ICD-10-CM

## 2016-04-07 NOTE — Therapy (Signed)
[place] MAIN [facility] SERVICES [address] [place], [place], [address] Phone: [phone]   Fax:  [phone]  Physical Therapy Treatment  Patient Details  Name: CARMEN TOLLIVER MRN: [id number] Date of Birth: 02-Jul-1985 Referring Provider: Barbaraann Boys  Encounter Date: 04/07/2016      PT End of Session - 04/07/16 1629    Visit Number 22   Number of Visits 26   Date for PT Re-Evaluation 03/26/16   PT Start Time 0400   PT Stop Time 0445   PT Time Calculation (min) 45 min   Equipment Utilized During Treatment Gait belt   Activity Tolerance Patient tolerated treatment well   Behavior During Therapy [facility] for tasks assessed/performed      Past Medical History:  Diagnosis Date  . Polysubstance abuse   . Restless leg syndrome unk    Past Surgical History:  Procedure Laterality Date  . CHOLECYSTECTOMY    . TUBAL LIGATION      There were no vitals filed for this visit.      Subjective Assessment - 04/07/16 1628    Subjective Patient is doing well today no new changes.   Pertinent History TBI   Patient Stated Goals to be able to walk without the loftstrand crutches   Currently in Pain? No/denies   Pain Score 0-No pain   Pain Onset Today   Multiple Pain Sites No        Neuromuscular training: Tandem stand with head turns x 2 minutes Side stepping with head control Stepping onto AIREX, then on to 4 inch step followed by stepping down onto AIREX and then level surface in //bars x15.  Pt required occasional UE assist, and performance improved with each repetition   Stepping over and back x10 bilaterally   Side step and back over stool  x10 bilaterally Side stepping on blue foam beam x 5 There exercise: Leg press with 90#x 10, x 2  sit to stand 2x10   Mini squats in //bars x 20 Patient continues to demonstrates  incoordination of movement with select exercises . Patient responds well to verbal and tactile cues to correct  form and technique.  Muscle fatigue but no major pain complaints.                          PT Education - 04/07/16 1629    Education provided Yes   Education Details HEP   Person(s) Educated Patient   Methods Explanation   Comprehension Verbalized understanding             PT Long Term Goals - 03/31/16 1645      PT LONG TERM GOAL #1   Title Patient will reduce timed up and go to <11 seconds to reduce fall risk and demonstrate improved transfer/gait ability.   Time 12   Period Weeks   Status Partially Met     PT LONG TERM GOAL #2   Title Patient will increase six minute walk test distance to >1000 for progression to community ambulator and improve gait ability   Time 12   Period Weeks   Status Partially Met     PT LONG TERM GOAL #3   Title Patient will increase 10 meter walk test to >1.78ms as to improve gait speed for better community ambulation and to reduce fall    Time 12   Period Weeks   Status Partially Met     PT LONG TERM  GOAL #4   Title Patient will tolerate 5 seconds of single leg stance without loss of balance to improve ability to get in and out of shower safely   Time 12   Status Partially Met     PT LONG TERM GOAL #5   Title Patient (< 48 years old) will complete five times sit to stand test in < 10 seconds indicating an increased LE strength and improved balance   Time 12   Status Partially Met               Plan - 04/07/16 1629    Clinical Impression Statement Patient has wide base of support during stepping and slow gait speed. She has decreased gait speed with loftstrand crutches.    Rehab Potential Good   PT Frequency 2x / week   PT Duration 12 weeks   PT Treatment/Interventions Therapeutic activities;Therapeutic exercise;Balance training;Neuromuscular re-education;Stair training;Gait training;Patient/family education   PT Next Visit Plan strengthening and balance training; gait training   PT Home Exercise Plan  balance in corner   Consulted and Agree with Plan of Care Patient      Patient will benefit from skilled therapeutic intervention in order to improve the following deficits and impairments:  Abnormal gait, Decreased balance, Decreased endurance, Difficulty walking, Decreased cognition, Decreased safety awareness, Decreased strength, Impaired UE functional use, Obesity  Visit Diagnosis: Muscle weakness (generalized)  Other lack of coordination  Difficulty in walking, not elsewhere classified     Problem List There are no active problems to display for this patient.   Alanson Puls 04/07/2016, 4:32 PM  [place] MAIN [facility] SERVICES [address] [place], [place], [address] Phone: [phone]   Fax:  [phone]  Name: DAMESHIA SEYBOLD MRN: [id number] Date of Birth: 05/15/1985
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
## 2016-04-08 NOTE — Patient Instructions (Signed)
OT TREATMENT    Neuro muscular re-education:   [facility]. worked [facility] skills grasping 1/8" beads. Pt. Worked on thumb opposition to the 2nd through 5th digits to remove the beads.   Selfcare:  Pt. Worked on problem solving through ADL/IADL home safety/judgement situations. Pt. Was able to identify 15/15 responses accurately, and was able to elaborate on responses often giving several different responses with cues.

## 2016-04-09 ENCOUNTER — Ambulatory Visit: Payer: 59 | Admitting: Physical Therapy

## 2016-04-09 ENCOUNTER — Encounter: Payer: Self-pay | Admitting: Physical Therapy

## 2016-04-09 ENCOUNTER — Ambulatory Visit: Payer: 59 | Admitting: Occupational Therapy

## 2016-04-09 ENCOUNTER — Ambulatory Visit: Payer: 59 | Admitting: Speech Pathology

## 2016-04-09 DIAGNOSIS — M6281 Muscle weakness (generalized): Secondary | ICD-10-CM | POA: Diagnosis not present

## 2016-04-09 DIAGNOSIS — R278 Other lack of coordination: Secondary | ICD-10-CM

## 2016-04-09 DIAGNOSIS — R262 Difficulty in walking, not elsewhere classified: Secondary | ICD-10-CM

## 2016-04-09 DIAGNOSIS — R41841 Cognitive communication deficit: Secondary | ICD-10-CM
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
## 2016-04-09 NOTE — Patient Instructions (Signed)
OT TREATMENT    Neuro muscular re-education:  Pt. performed [facility] skills training to improve speed and dexterity needed for ADL tasks and writing. Pt. demonstrated grasping 1 inch sticks,  inch cylindrical collars, and  inch flat washers on the Purdue pegboard. Pt. performed grasping each item with her 2nd digit and thumb, and storing them in the palm. Pt. presented with difficulty storing  inch objects at a time in the palmar aspect of the hand. Pt. Worked on increasing speed with alternating hand movements.  Selfcare:  Pt. worked on identifying medical situations using safety awareness and judgement. Pt. Requires verbal cues for more detailed answers.Pt. worked on navigating, and answering questions about simple recipes.

## 2016-04-09 NOTE — Therapy (Signed)
[place] MAIN [facility] SERVICES [address] [place], [place], [address] Phone: [phone]   Fax:  [phone]  Physical Therapy Treatment  Patient Details  Name: Natasha [name] MRN: [id number] Date of Birth: 1985/09/16 Referring Provider: Barbaraann Boys  Encounter Date: 04/09/2016      PT End of Session - 04/09/16 1626    Visit Number 23   Number of Visits 26   Date for PT Re-Evaluation 03/26/16   PT Start Time 0400   PT Stop Time 0445   PT Time Calculation (min) 45 min   Equipment Utilized During Treatment Gait belt   Activity Tolerance Patient tolerated treatment well   Behavior During Therapy [facility] for tasks assessed/performed      Past Medical History:  Diagnosis Date  . Polysubstance abuse   . Restless leg syndrome unk    Past Surgical History:  Procedure Laterality Date  . CHOLECYSTECTOMY    . TUBAL LIGATION      There were no vitals filed for this visit.      Subjective Assessment - 04/09/16 1625    Subjective Patient is doing well today no new changes.   Pertinent History TBI   Patient Stated Goals to be able to walk without the loftstrand crutches   Currently in Pain? No/denies   Pain Score 0-No pain   Pain Onset Today     Therapeutic exercise;  Standing on disk side by side and tandem x 2 minutes Rocker board fwd/bwd x 2 minutes Side stepping on blue foam x 5 Standing hip SLR, abd, ext with GTB 2x10 each Heel raises with UE support 2 x 10; Leg press 2x20 with  100#; heel raises with 100 lbs on quantum leg press  20 x 2, min verbal cues for proper technique CGA when stepping up and all balance exercises with cues for posture correction.                           PT Education - 04/09/16 1626    Education provided Yes   Education Details LE strengthening   Person(s) Educated Patient   Methods Explanation   Comprehension Verbalized understanding             PT Long Term  Goals - 03/31/16 1645      PT LONG TERM GOAL #1   Title Patient will reduce timed up and go to <11 seconds to reduce fall risk and demonstrate improved transfer/gait ability.   Time 12   Period Weeks   Status Partially Met     PT LONG TERM GOAL #2   Title Patient will increase six minute walk test distance to >1000 for progression to community ambulator and improve gait ability   Time 12   Period Weeks   Status Partially Met     PT LONG TERM GOAL #3   Title Patient will increase 10 meter walk test to >1.20ms as to improve gait speed for better community ambulation and to reduce fall    Time 12   Period Weeks   Status Partially Met     PT LONG TERM GOAL #4   Title Patient will tolerate 5 seconds of single leg stance without loss of balance to improve ability to get in and out of shower safely   Time 12   Status Partially Met     PT LONG TERM GOAL #5   Title Patient (< 665years old) will  complete five times sit to stand test in < 10 seconds indicating an increased LE strength and improved balance   Time 12   Status Partially Met               Plan - 04/09/16 1627    Clinical Impression Statement Fatigue with sit to stand but demonstrating more control, Increase weight for standing exercises. Fatigue still evident with cross trainer and endurance.    Rehab Potential Good   PT Frequency 2x / week   PT Duration 12 weeks   PT Treatment/Interventions Therapeutic activities;Therapeutic exercise;Balance training;Neuromuscular re-education;Stair training;Gait training;Patient/family education   PT Next Visit Plan strengthening and balance training; gait training   PT Home Exercise Plan balance in corner   Consulted and Agree with Plan of Care Patient      Patient will benefit from skilled therapeutic intervention in order to improve the following deficits and impairments:  Abnormal gait, Decreased balance, Decreased endurance, Difficulty walking, Decreased cognition,  Decreased safety awareness, Decreased strength, Impaired UE functional use, Obesity  Visit Diagnosis: Muscle weakness (generalized)  Other lack of coordination  Difficulty in walking, not elsewhere classified     Problem List There are no active problems to display for this patient.  Alanson Puls, PT, DPT [place], Minette Headland S 04/09/2016, 4:34 PM  [place] MAIN [facility] SERVICES [address] [place], [place], [address] Phone: [phone]   Fax:  [phone]  Name: DENISHIA CITRO MRN: [id number] Date of Birth: 1986/04/04

## 2016-04-10 ENCOUNTER — Encounter: Payer: Self-pay | Admitting: Speech Pathology

## 2016-04-10 NOTE — Therapy (Signed)
[place] MAIN [facility] SERVICES [address] [place], [place], [address] Phone: [phone]   Fax:  [phone]  Speech Language Pathology Treatment  Patient Details  Name: Natasha [name] MRN: [id number] Date of Birth: 1985-09-16 Referring Provider: Barbaraann Boys  Encounter Date: 04/09/2016      End of Session - 04/10/16 0858    Visit Number 13   Number of Visits 20   Date for SLP Re-Evaluation 05/13/15   SLP Start Time 17   SLP Stop Time  1500   SLP Time Calculation (min) 60 min      Past Medical History:  Diagnosis Date  . Polysubstance abuse   . Restless leg syndrome unk    Past Surgical History:  Procedure Laterality Date  . CHOLECYSTECTOMY    . TUBAL LIGATION      There were no vitals filed for this visit.      Subjective Assessment - 04/10/16 0857    Subjective Patient states she sees her neurologist on Friday   Currently in Pain? No/denies               ADULT SLP TREATMENT - 04/10/16 0001      General Information   Behavior/Cognition Alert;Cooperative;Pleasant mood   HPI TBI     Treatment Provided   Treatment provided Cognitive-Linquistic     Pain Assessment   Pain Assessment No/denies pain     Cognitive-Linquistic Treatment   Treatment focused on Cognition;Patient/family/caregiver education   Skilled Treatment INDEPENDENCE/SELF-RESPONSIBILITY: The patient is able to state basic requirements for being able to live independently (source of income, household maintenance, pay bills, transportation) and generate plans for barriers (access [employer] for job, transportation options).  READING COMPREHENSION/ABSTRACT LANAGUAGE: The patient completed Inferences worksheets with overall 80% accuracy, language continues to be vague initially but she is able to clarify with cues.  Patient able to complete Level A Perplexors (requires remembering "rules", remembering information from clues, manipulating and  reasoning around irrelevant information) with min cues.     Assessment / Recommendations / Plan   Plan Continue with current plan of care     Progression Toward Goals   Progression toward goals Progressing toward goals          SLP Education - 04/10/16 0857    Education provided Yes   Education Details Generate written questions for neurologist and be prepared to take notes   Person(s) Educated Patient   Methods Explanation   Comprehension Verbalized understanding            SLP Long Term Goals - 03/19/16 1541      SLP LONG TERM GOAL #1   Title Patient will demonstrate functional cognitive-communication skills for independent completion of personal responsibilities.   Time 10   Period Weeks   Status Partially Met     SLP LONG TERM GOAL #2   Title Patient will independently complete complex executive function skills tasks with 80% accuracy.   Time 10   Period Weeks   Status Partially Met     SLP LONG TERM GOAL #3   Title Patient will complete complex attention tasks with 80% accuracy.   Time 10   Period Weeks   Status Partially Met     SLP LONG TERM GOAL #4   Title Patient will complete memory strategy activities with 80% accuracy.   Time 10   Period Weeks     SLP LONG TERM GOAL #5   Title Patient will generate grammatical  and cogent sentences to complete abstract/complex linguistic tasks with 80% accuracy   Time 10   Period Weeks   Status Partially Met          Plan - 04/10/16 0858    Clinical Impression Statement The patient was able to participate in discussion regarding requirements for living independently, barriers for her living independently, and potential strategies RE: barriers.   Speech Therapy Frequency 2x / week   Duration Other (comment)   Treatment/Interventions Compensatory techniques;Cognitive reorganization;Internal/external aids;Functional tasks;SLP instruction and feedback;Compensatory strategies;Patient/family education   Potential  to Achieve Goals Good   SLP Home Exercise Plan Complete additional word puzzle; refer to notebook and implement plan for communication with neurologist   Consulted and Agree with Plan of Care Patient      Patient will benefit from skilled therapeutic intervention in order to improve the following deficits and impairments:   Cognitive communication deficit    Problem List There are no active problems to display for this patient.  Leroy Sea, MS/CCC- SLP  Lou Miner 04/10/2016, 9:01 AM  [place] MAIN [facility] SERVICES [address] [place], [place], [address] Phone: [phone]   Fax:  [phone]   Name: Natasha [name] MRN: [id number] Date of Birth: [birth year]

## 2016-04-15 ENCOUNTER — Ambulatory Visit: Payer: 59

## 2016-04-15 ENCOUNTER — Ambulatory Visit: Payer: 59 | Admitting: Occupational Therapy

## 2016-04-15 DIAGNOSIS — M6281 Muscle weakness (generalized): Secondary | ICD-10-CM

## 2016-04-15 DIAGNOSIS — R262 Difficulty in walking, not elsewhere classified: Secondary | ICD-10-CM

## 2016-04-15 NOTE — Patient Instructions (Addendum)
OT TREATMENT    Neuro muscular re-education:  Pt. Worked on grasping 2" stick pegs, holding them in the hand, and moving them to the tip of her digits. Pt. Worked on placing them in a board placed at a vertical angle. Pt. worked with resistive tweezers to remove pegs with emphasis placed on wrist extension. Pt. Required cues to reposition in her hand.  Selfcare:  Pt. worked on Nurse, adultnavigating schedules. Pt. requires verbal cues to find answers to, and navigate through items on a schedule.

## 2016-04-15 NOTE — Therapy (Signed)
[place] MAIN [facility] SERVICES [address] [place], [place], [address] Phone: [phone]   Fax:  [phone]  Physical Therapy Treatment  Patient Details  Name: Natasha [name] MRN: [id number] Date of Birth: 01-31-1986 Referring Provider: Barbaraann Boys  Encounter Date: 04/15/2016      PT End of Session - 04/15/16 1308    Visit Number 24   Number of Visits 26   Date for PT Re-Evaluation 05/22/15   PT Start Time 1304   PT Stop Time 1346   PT Time Calculation (min) 42 min   Equipment Utilized During Treatment Gait belt   Activity Tolerance Patient tolerated treatment well   Behavior During Therapy [facility] for tasks assessed/performed      Past Medical History:  Diagnosis Date  . Polysubstance abuse   . Restless leg syndrome unk    Past Surgical History:  Procedure Laterality Date  . CHOLECYSTECTOMY    . TUBAL LIGATION      There were no vitals filed for this visit.      Subjective Assessment - 04/15/16 1303    Subjective Patient is doing well today. She had an appointment with her neurologist on Friday. She states that her neurologist did not tell her she had to live in a group home. Pt reports that she doesn't want to live in a group home because she gets her children on the weekends and would not be able to have them with her at the group home.    Pertinent History TBI   Patient Stated Goals to be able to walk without the loftstrand crutches   Currently in Pain? No/denies       TREATMENT  Neuromuscular training NuStep x 5 minutes for warm-up during history; Tandem gait on 2"x4" in // bars x 6 lengths; Side stepping on 2"x4" in // bars x 6 lengths; Ambulation in hallway with horizontal and vertical head turns, gait speed changes, and cues to increase step length; Quick 180 degree turns in hallway first left and then right; Stepping from Airex onto 4 inch step with Airex on top  Stepping over 1/2 foam roll and back x  10 bilaterally;  Single leg balance with CW and CCW ball rolls with contralateral LE x 20 each direction on each LE;  Ther-ex Leg press with 120# x 10, 135# x 10, 150# x 10;    Patient continues to demonstrates incoordination of movement with select exercises especially with placement of LLE. Patient responds well to verbal and tactile cues to correct form and technique.                       PT Education - 04/15/16 1303    Education provided Yes   Education Details Continue with HEP   Person(s) Educated Patient   Methods Explanation   Comprehension Verbalized understanding             PT Long Term Goals - 03/31/16 1645      PT LONG TERM GOAL #1   Title Patient will reduce timed up and go to <11 seconds to reduce fall risk and demonstrate improved transfer/gait ability.   Time 12   Period Weeks   Status Partially Met     PT LONG TERM GOAL #2   Title Patient will increase six minute walk test distance to >1000 for progression to community ambulator and improve gait ability   Time 12   Period Weeks   Status Partially  Met     PT LONG TERM GOAL #3   Title Patient will increase 10 meter walk test to >1.100ms as to improve gait speed for better community ambulation and to reduce fall    Time 12   Period Weeks   Status Partially Met     PT LONG TERM GOAL #4   Title Patient will tolerate 5 seconds of single leg stance without loss of balance to improve ability to get in and out of shower safely   Time 12   Status Partially Met     PT LONG TERM GOAL #5   Title Patient (< 632years old) will complete five times sit to stand test in < 10 seconds indicating an increased LE strength and improved balance   Time 12   Status Partially Met               Plan - 04/15/16 1308    Clinical Impression Statement Pt demonstrates good tolerance for activity on this date without considerable fatigue noted. She struggles with LE coordination with stepping  activities and ball rolls. Pt also has difficulty with foot placement during Airex step-ups. Pt encouraged to continue HEP and follow-up as scheduled.    Rehab Potential Good   PT Frequency 2x / week   PT Duration 12 weeks   PT Treatment/Interventions Therapeutic activities;Therapeutic exercise;Balance training;Neuromuscular re-education;Stair training;Gait training;Patient/family education   PT Next Visit Plan strengthening and balance training; gait training   PT Home Exercise Plan As prescribed   Consulted and Agree with Plan of Care Patient      Patient will benefit from skilled therapeutic intervention in order to improve the following deficits and impairments:  Abnormal gait, Decreased balance, Decreased endurance, Difficulty walking, Decreased cognition, Decreased safety awareness, Decreased strength, Impaired UE functional use, Obesity  Visit Diagnosis: Muscle weakness (generalized)  Difficulty in walking, not elsewhere classified     Problem List There are no active problems to display for this patient.  JPhillips GroutPT, DPT   Stephen Baruch 04/15/2016, 2:28 PM  [place] [facility] [address] [place] [address]Phone: [phone]  Fax:  [phone] Name: WXOCHILTH STANDISHMRN: [id number] of Birth: [birth year]
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
## 2016-04-17 ENCOUNTER — Ambulatory Visit: Payer: 59 | Admitting: Physical Therapy

## 2016-04-17 ENCOUNTER — Ambulatory Visit: Payer: 59 | Admitting: Occupational Therapy

## 2016-04-17 ENCOUNTER — Encounter: Payer: Self-pay | Admitting: Occupational Therapy

## 2016-04-17 ENCOUNTER — Encounter: Payer: Self-pay | Admitting: Physical Therapy

## 2016-04-17 DIAGNOSIS — R262 Difficulty in walking, not elsewhere classified: Secondary | ICD-10-CM

## 2016-04-17 DIAGNOSIS — M6281 Muscle weakness (generalized): Secondary | ICD-10-CM

## 2016-04-17 DIAGNOSIS — R278 Other lack of coordination: Secondary | ICD-10-CM

## 2016-04-17 NOTE — Therapy (Signed)
[place] MAIN [facility] SERVICES [address] [place], [place], [address] Phone: [phone]   Fax:  [phone]  Physical Therapy Treatment  Patient Details  Name: Natasha [name] MRN: [id number] Date of Birth: [birth year] Referring Provider: Barbaraann Boys  Encounter Date: 04/17/2016      PT End of Session - 04/17/16 1324    Visit Number 25   Number of Visits 26   Date for PT Re-Evaluation 05/22/15   PT Start Time 4174   PT Stop Time 1345   PT Time Calculation (min) 40 min   Equipment Utilized During Treatment Gait belt   Activity Tolerance Patient tolerated treatment well   Behavior During Therapy [facility] for tasks assessed/performed      Past Medical History:  Diagnosis Date  . Polysubstance abuse   . Restless leg syndrome unk    Past Surgical History:  Procedure Laterality Date  . CHOLECYSTECTOMY    . TUBAL LIGATION      There were no vitals filed for this visit.      Subjective Assessment - 04/17/16 1322    Subjective Patient is doing well and has some reports of low back pain. She is doing her HEP and is walking better without loftstrand crutches.    Currently in Pain? No/denies   Pain Score 0-No pain   Pain Onset Today   Multiple Pain Sites No        NEUROMUSCULAR RE-EDUCATION and There ex: Side stepping on 2 x 4 x 10, fwd stepping on 2 x 4 Step ups on 6 inch stool x 20 Tandem standing purple foam x 3 minutes with head turns Rocker board fwd/bwd and side to side BLE staggered stance anterior/pos terior weight shifting on small rockerboard; CGA, increased difficulty and more unsteadiness with RLE fwd; min cues to increase weight shift over RLE BLE NBOS stance on airex pad with no UE and EC, x 2 mintues  BLE staggered stance with front foot on dynadisc CGA, increased hip flexion to maintain balance Fwd/retro tandem walk on long airex with no UE support x 12 laps; mod verbal cues to decrease UE support, ; CGA - min  A for steadiness throughout Bil side stepping on long airex with no UE support x 5 laps; increased difficulty with side stepping R, CGA for steadiness Patient needs occasional verbal cueing to improve posture and cueing to correctly perform exercises slowly, holding at end of range to increase motor firing of desired muscle to encourage fatigue.                          PT Education - 04/17/16 1323    Education provided Yes   Education Details LE strengthening   Person(s) Educated Patient   Methods Explanation   Comprehension Verbalized understanding             PT Long Term Goals - 03/31/16 1645      PT LONG TERM GOAL #1   Title Patient will reduce timed up and go to <11 seconds to reduce fall risk and demonstrate improved transfer/gait ability.   Time 12   Period Weeks   Status Partially Met     PT LONG TERM GOAL #2   Title Patient will increase six minute walk test distance to >1000 for progression to community ambulator and improve gait ability   Time 12   Period Weeks   Status Partially Met     PT LONG  TERM GOAL #3   Title Patient will increase 10 meter walk test to >1.70ms as to improve gait speed for better community ambulation and to reduce fall    Time 12   Period Weeks   Status Partially Met     PT LONG TERM GOAL #4   Title Patient will tolerate 5 seconds of single leg stance without loss of balance to improve ability to get in and out of shower safely   Time 12   Status Partially Met     PT LONG TERM GOAL #5   Title Patient (< 616years old) will complete five times sit to stand test in < 10 seconds indicating an increased LE strength and improved balance   Time 12   Status Partially Met               Plan - 04/17/16 1324    Clinical Impression Statement Patient performs strengthening and balance exericses without reports of pain but does need a seated rest follwoing standing balance exercises.    Rehab Potential Good   PT  Frequency 2x / week   PT Duration 12 weeks   PT Treatment/Interventions Therapeutic activities;Therapeutic exercise;Balance training;Neuromuscular re-education;Stair training;Gait training;Patient/family education   PT Next Visit Plan strengthening and balance training; gait training   PT Home Exercise Plan As prescribed   Consulted and Agree with Plan of Care Patient      Patient will benefit from skilled therapeutic intervention in order to improve the following deficits and impairments:  Abnormal gait, Decreased balance, Decreased endurance, Difficulty walking, Decreased cognition, Decreased safety awareness, Decreased strength, Impaired UE functional use, Obesity  Visit Diagnosis: Muscle weakness (generalized)  Difficulty in walking, not elsewhere classified     Problem List There are no active problems to display for this patient. KAlanson Puls PT, DPT  [facility] [place] 04/17/2016, 1:42 PM  [place] [facility] [address] [address]Phone: [phone]  Fax:  [phone] Name: Natasha BREESMRN: [id number] of Birth: 5September 28, 1987
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
## 2016-04-22 ENCOUNTER — Encounter: Payer: Self-pay | Admitting: Occupational Therapy

## 2016-04-22 ENCOUNTER — Ambulatory Visit: Payer: 59 | Admitting: Occupational Therapy

## 2016-04-22 ENCOUNTER — Ambulatory Visit: Payer: 59 | Admitting: Speech Pathology

## 2016-04-22 DIAGNOSIS — M6281 Muscle weakness (generalized): Secondary | ICD-10-CM | POA: Diagnosis not present

## 2016-04-22 DIAGNOSIS — R278 Other lack of coordination: Secondary | ICD-10-CM

## 2016-04-22 DIAGNOSIS — R41841 Cognitive communication deficit: Secondary | ICD-10-CM

## 2016-04-22 NOTE — Patient Instructions (Signed)
OT TREATMENT    Neuro muscular re-education:   Pt. worked on grasping 2" sticks and placing them in a board placed on a vertical angle. Pt. worked on removing the pegs with resistive tweezers using lateral pinch to remove the pegs while incorporating wrist extension.  Selfcare:  Pt. worked on Neurosurgeoncomputing functional math in Hotel managersubtraction for IADL tasks. Pt. required verbal cues and assist for calculating/subtracting dollar amounts.
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
## 2016-04-23 ENCOUNTER — Encounter: Payer: Self-pay | Admitting: Speech Pathology

## 2016-04-23 NOTE — Therapy (Signed)
[place] MAIN [facility] SERVICES [address] [place], [place], [address] Phone: [phone]   Fax:  [phone]  Speech Language Pathology Treatment  Patient Details  Name: Natasha [name] MRN: [id number] Date of Birth: [birth year] Referring Provider: Barbaraann Boys  Encounter Date: 04/22/2016      End of Session - 04/23/16 1111    Visit Number 14   Number of Visits 20   Date for SLP Re-Evaluation 05/13/15   SLP Start Time 1501   SLP Stop Time  1600   SLP Time Calculation (min) 59 min      Past Medical History:  Diagnosis Date  . Polysubstance abuse   . Restless leg syndrome unk    Past Surgical History:  Procedure Laterality Date  . CHOLECYSTECTOMY    . TUBAL LIGATION      There were no vitals filed for this visit.      Subjective Assessment - 04/23/16 1109    Subjective The patient wants to live independently   Currently in Pain? No/denies               ADULT SLP TREATMENT - 04/23/16 0001      General Information   Behavior/Cognition Alert;Cooperative;Pleasant mood   HPI TBI     Treatment Provided   Treatment provided Cognitive-Linquistic     Pain Assessment   Pain Assessment No/denies pain     Cognitive-Linquistic Treatment   Treatment focused on Cognition;Patient/family/caregiver education   Skilled Treatment INDEPENDENCE/SELF-RESPONSIBILITY: The patient is able to state basic requirements for being able to live independently (source of income, household maintenance, pay bills, transportation) and generate plans for barriers (access [employer] for job, transportation options).  READING COMPREHENSION/ABSTRACT LANAGUAGE: The patient completed Inferences worksheets with overall 80% accuracy, language continues to be vague initially but she is able to clarify with cues.  Patient able to answer common knowledge questions with overall 80% accuracy, which is an improvement for her.     Assessment /  Recommendations / Plan   Plan Continue with current plan of care     Progression Toward Goals   Progression toward goals Progressing toward goals          SLP Education - 04/23/16 1110    Education provided Yes   Education Details need to plan for taking care of herself independently, if this is her goal   Person(s) Educated Patient   Methods Explanation   Comprehension Verbalized understanding            SLP Long Term Goals - 03/19/16 1541      SLP LONG TERM GOAL #1   Title Patient will demonstrate functional cognitive-communication skills for independent completion of personal responsibilities.   Time 10   Period Weeks   Status Partially Met     SLP LONG TERM GOAL #2   Title Patient will independently complete complex executive function skills tasks with 80% accuracy.   Time 10   Period Weeks   Status Partially Met     SLP LONG TERM GOAL #3   Title Patient will complete complex attention tasks with 80% accuracy.   Time 10   Period Weeks   Status Partially Met     SLP LONG TERM GOAL #4   Title Patient will complete memory strategy activities with 80% accuracy.   Time 10   Period Weeks     SLP LONG TERM GOAL #5   Title Patient will generate grammatical and cogent sentences to complete  abstract/complex linguistic tasks with 80% accuracy   Time 10   Period Weeks   Status Partially Met          Plan - 04/23/16 1112    Clinical Impression Statement The patient was able to participate in discussion regarding requirements for living independently, barriers for her living independently, and potential strategies RE: barriers.  Her language skills are becoming more content rich and she demonstrates improved access to long term memory/common knowledge.    Speech Therapy Frequency 2x / week   Duration Other (comment)   Treatment/Interventions Compensatory techniques;Cognitive reorganization;Internal/external aids;Functional tasks;SLP instruction and  feedback;Compensatory strategies;Patient/family education   Potential to Achieve Goals Good   Potential Considerations Ability to learn/carryover information;Cooperation/participation level;Previous level of function;Severity of impairments;Family/community support   Consulted and Agree with Plan of Care Patient      Patient will benefit from skilled therapeutic intervention in order to improve the following deficits and impairments:   Cognitive communication deficit    Problem List There are no active problems to display for this patient.  Leroy Sea, MS/CCC- SLP  Lou Miner 04/23/2016, 11:13 AM  [place] MAIN [facility] SERVICES [address] [place], [place], [address] Phone: [phone]   Fax:  [phone]   Name: Natasha [name] MRN: [id number] Date of Birth: [birth year]

## 2016-04-24 ENCOUNTER — Encounter: Payer: 59 | Admitting: Occupational Therapy

## 2016-04-24 ENCOUNTER — Ambulatory Visit: Payer: 59 | Admitting: Physical Therapy

## 2016-04-29 ENCOUNTER — Ambulatory Visit: Payer: Commercial Managed Care - HMO | Admitting: Physical Therapy

## 2016-04-29 ENCOUNTER — Ambulatory Visit: Payer: Commercial Managed Care - HMO | Admitting: Speech Pathology

## 2016-04-29 ENCOUNTER — Ambulatory Visit: Payer: Commercial Managed Care - HMO | Admitting: Occupational Therapy

## 2016-05-01 ENCOUNTER — Ambulatory Visit: Payer: Commercial Managed Care - HMO | Admitting: Occupational Therapy

## 2016-05-01 ENCOUNTER — Encounter: Payer: Self-pay | Admitting: Physical Therapy

## 2016-05-01 ENCOUNTER — Ambulatory Visit: Payer: Commercial Managed Care - HMO | Attending: Pediatrics | Admitting: Physical Therapy

## 2016-05-01 DIAGNOSIS — R262 Difficulty in walking, not elsewhere classified: Secondary | ICD-10-CM

## 2016-05-01 DIAGNOSIS — R278 Other lack of coordination: Secondary | ICD-10-CM | POA: Diagnosis present

## 2016-05-01 DIAGNOSIS — M6281 Muscle weakness (generalized): Secondary | ICD-10-CM

## 2016-05-01 DIAGNOSIS — R41841 Cognitive communication deficit: Secondary | ICD-10-CM

## 2016-05-01 NOTE — Patient Instructions (Signed)
OT TREATMENT    Therapeutic Exercise:  Pt. performed 4# dowel ex. For UE strengthening secondary to weakness. Bilateral shoulder flexion, chest press, circular patterns, and elbow flexion/extension were performed. 3# dumbbell ex. for elbow flexion and extension,  2# for forearm supination/pronation, wrist flexion/extension, and radial deviation. Pt. requires rest breaks and verbal cues for proper technique.  Selfcare:  Pt. Worked on navigatingidentifying items that are wrong on a calendar. Pt. was able to identify 10 incorrect Items on the calendar with cues for calendar layout, and format, days of the week, and Month.

## 2016-05-01 NOTE — Therapy (Signed)
[place] MAIN [facility] SERVICES [address] [place], [place], [address] Phone: [phone]   Fax:  [phone]  Physical Therapy Treatment  Patient Details  Name: Natasha [name] MRN: [id number] Date of Birth: 19-Jul-1985 Referring Provider: Barbaraann Boys  Encounter Date: 05/01/2016      PT End of Session - 05/01/16 1402    Visit Number 26   Number of Visits 49   Date for PT Re-Evaluation 05/22/15   Authorization Type 33 visits beginning 05/01/16    PT Start Time 0145   PT Stop Time 0230   PT Time Calculation (min) 45 min   Equipment Utilized During Treatment Gait belt   Activity Tolerance Patient tolerated treatment well   Behavior During Therapy [facility] for tasks assessed/performed      Past Medical History:  Diagnosis Date  . Polysubstance abuse   . Restless leg syndrome unk    Past Surgical History:  Procedure Laterality Date  . CHOLECYSTECTOMY    . TUBAL LIGATION      There were no vitals filed for this visit.      Subjective Assessment - 05/01/16 1359    Subjective Patient is doing well and has some reports of low back pain. She is doing her HEP and is walking better without loftstrand crutches. She is doing her HEP at home.   Pertinent History TBI   Patient Stated Goals to be able to walk without the loftstrand crutches   Currently in Pain? No/denies   Pain Score 0-No pain   Pain Onset Today      NMR: Standing side stepping left and right on foam in parallel bars with CGA x 10 x 3 Standing on blue foam with feet together and UE ball diagonals left and right x 20 Standing on blue foam with feet together and sorting balls left and right UE  Standing on blue foam and tapping left and right x 20 Standing on blue foam and step ups to 6 inch stool x 20  Leg press heel raises 75 lbs x 20 x 3, 130 lbs leg press x 20 x 3 TM walking at 1.2 m/hour x 10 mins Matrix x 4 fwd/bwd with 17. 5 lbs  CGA and Min  verbal cues used  throughout with increased in postural sway and LOB most seen with narrow base of support and while on uneven surfaces. Continues to have balance deficits typical with diagnosis. Patient performs high level exercises without pain behaviors                           PT Education - 05/01/16 1400    Education provided Yes   Education Details safety with balance during reaching and bending with narrow base of support   Person(s) Educated Patient   Methods Explanation   Comprehension Verbalized understanding             PT Long Term Goals - 03/31/16 1645      PT LONG TERM GOAL #1   Title Patient will reduce timed up and go to <11 seconds to reduce fall risk and demonstrate improved transfer/gait ability.   Time 12   Period Weeks   Status Partially Met     PT LONG TERM GOAL #2   Title Patient will increase six minute walk test distance to >1000 for progression to community ambulator and improve gait ability   Time 12   Period Weeks   Status  Partially Met     PT LONG TERM GOAL #3   Title Patient will increase 10 meter walk test to >1.72ms as to improve gait speed for better community ambulation and to reduce fall    Time 12   Period Weeks   Status Partially Met     PT LONG TERM GOAL #4   Title Patient will tolerate 5 seconds of single leg stance without loss of balance to improve ability to get in and out of shower safely   Time 12   Status Partially Met     PT LONG TERM GOAL #5   Title Patient (< 676years old) will complete five times sit to stand test in < 10 seconds indicating an increased LE strength and improved balance   Time 12   Status Partially Met               Plan - 05/01/16 1405    Clinical Impression Statement Patient continues to have impaired dynamic standing balance evidenced with single leg stand activities and with challenges to somatosensory system. She has low stamina and need standing rest periods .    Rehab Potential Good    PT Frequency 2x / week   PT Duration 12 weeks   PT Treatment/Interventions Therapeutic activities;Therapeutic exercise;Balance training;Neuromuscular re-education;Stair training;Gait training;Patient/family education   PT Next Visit Plan strengthening and balance training; gait training   PT Home Exercise Plan As prescribed   Consulted and Agree with Plan of Care Patient      Patient will benefit from skilled therapeutic intervention in order to improve the following deficits and impairments:  Abnormal gait, Decreased balance, Decreased endurance, Difficulty walking, Decreased cognition, Decreased safety awareness, Decreased strength, Impaired UE functional use, Obesity  Visit Diagnosis: Cognitive communication deficit  Muscle weakness (generalized)  Other lack of coordination  Difficulty in walking, not elsewhere classified     Problem List There are no active problems to display for this patient. KAlanson Puls PT, DPT  [place] 05/01/2016, 2:08 PM  [place] [facility] [address] [address]Phone: [phone]  Fax:  [phone] Name: Natasha THULLMRN: [id number] of Birth: 509/04/1985
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
## 2016-05-05 ENCOUNTER — Encounter: Payer: Self-pay | Admitting: Physical Therapy

## 2016-05-05 ENCOUNTER — Ambulatory Visit: Payer: Commercial Managed Care - HMO | Admitting: Speech Pathology

## 2016-05-05 ENCOUNTER — Ambulatory Visit: Payer: Commercial Managed Care - HMO | Admitting: Occupational Therapy

## 2016-05-05 ENCOUNTER — Ambulatory Visit: Payer: Commercial Managed Care - HMO | Admitting: Physical Therapy

## 2016-05-05 DIAGNOSIS — R41841 Cognitive communication deficit: Secondary | ICD-10-CM | POA: Diagnosis not present

## 2016-05-05 DIAGNOSIS — R278 Other lack of coordination: Secondary | ICD-10-CM

## 2016-05-05 DIAGNOSIS — M6281 Muscle weakness (generalized): Secondary | ICD-10-CM
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
## 2016-05-05 NOTE — Patient Instructions (Signed)
OT TREATMENT    Neuro muscular re-education:Pt. Worked on grasping mini clips with her thumb and 2nd digit, and placing them at a target on a vertical plane. Pt. worked on removing the clips using thumb opposition to the 2nd through 5th digits. Pt. Worked on grasping, flipping, and stacking 1&1/2" pegs on the instructo board with the right, and left hand. Pt. performed gross gripping with grip strengthener. Pt. worked on sustaining grip while grasping pegs and reaching at various heights. Gripper was placed in the 2nd resistive slot with the white resistive spring.  Selfcare:  Pt. Worked on filling in a month long calendar with a series of events. Pt. Required cues to start the calendar number of days. Pt. Also required cues for spacing, and writing in several events in one day space on the calendar.

## 2016-05-06 ENCOUNTER — Encounter: Payer: Self-pay | Admitting: Physical Therapy

## 2016-05-06 ENCOUNTER — Encounter: Payer: Self-pay | Admitting: Speech Pathology

## 2016-05-06 NOTE — Therapy (Signed)
[place] MAIN [facility] SERVICES [address] [place], [place], [address] Phone: [phone]   Fax:  [phone]  Speech Language Pathology Treatment  Patient Details  Name: Natasha [name] MRN: [id number] Date of Birth: [birth year] Referring Provider: Barbaraann Boys  Encounter Date: 05/05/2016      End of Session - 05/06/16 1004    Visit Number 15   Number of Visits 20   Date for SLP Re-Evaluation 05/13/15   SLP Start Time 39   SLP Stop Time  1500   SLP Time Calculation (min) 60 min   Activity Tolerance Patient tolerated treatment well      Past Medical History:  Diagnosis Date  . Polysubstance abuse   . Restless leg syndrome unk    Past Surgical History:  Procedure Laterality Date  . CHOLECYSTECTOMY    . TUBAL LIGATION      There were no vitals filed for this visit.      Subjective Assessment - 05/06/16 1003    Subjective The patient wants to live independently   Currently in Pain? No/denies               ADULT SLP TREATMENT - 05/06/16 0001      General Information   Behavior/Cognition Alert;Cooperative;Pleasant mood   HPI TBI     Treatment Provided   Treatment provided Cognitive-Linquistic     Pain Assessment   Pain Assessment No/denies pain     Cognitive-Linquistic Treatment   Treatment focused on Cognition;Patient/family/caregiver education   Skilled Treatment INDEPENDENCE/SELF-RESPONSIBILITY: The patient is able to state basic requirements for being able to live independently (source of income, household maintenance, pay bills, transportation) and generate plans for barriers (access [facility] for job, transportation options).  READING COMPREHENSION/ABSTRACT LANAGUAGE: Verbalize pros and cons given 3 solutions to hypothetical situation with overall 80% accuracy, language continues to be vague initially but she is able to clarify with cues.  Read 5th grade level article and answer 4 questions with 75%  accuracy given min-mod cues to accurately determine the question being asked. Reading fluency is significantly improved.      Assessment / Recommendations / Plan   Plan Continue with current plan of care     Progression Toward Goals   Progression toward goals Progressing toward goals          SLP Education - 05/06/16 1004    Education provided Yes   Education Details Need to think before responding   Person(s) Educated Patient   Methods Explanation   Comprehension Verbalized understanding            SLP Long Term Goals - 03/19/16 1541      SLP LONG TERM GOAL #1   Title Patient will demonstrate functional cognitive-communication skills for independent completion of personal responsibilities.   Time 10   Period Weeks   Status Partially Met     SLP LONG TERM GOAL #2   Title Patient will independently complete complex executive function skills tasks with 80% accuracy.   Time 10   Period Weeks   Status Partially Met     SLP LONG TERM GOAL #3   Title Patient will complete complex attention tasks with 80% accuracy.   Time 10   Period Weeks   Status Partially Met     SLP LONG TERM GOAL #4   Title Patient will complete memory strategy activities with 80% accuracy.   Time 10   Period Weeks     SLP LONG  TERM GOAL #5   Title Patient will generate grammatical and cogent sentences to complete abstract/complex linguistic tasks with 80% accuracy   Time 10   Period Weeks   Status Partially Met          Plan - 05/06/16 1005    Clinical Impression Statement The patient demonstrated good working and short-term memory for [employer] tasks of a 5th grade level article. She demonstrated difficulty with determining exactly what question was being asked secondary impulsive responses without thinking through the question.  The patient remembered and implemented strategies for process-of-elimination word puzzle given min cues.   Speech Therapy Frequency 2x / week    Duration Other (comment)   Treatment/Interventions Compensatory techniques;Cognitive reorganization;Internal/external aids;Functional tasks;SLP instruction and feedback;Compensatory strategies;Patient/family education   Potential to Achieve Goals Good   Potential Considerations Ability to learn/carryover information;Cooperation/participation level;Previous level of function;Severity of impairments;Family/community support   SLP Home Exercise Plan given 6th grade level article and Cephas Darby to return next time   Consulted and Agree with Plan of Care Patient      Patient will benefit from skilled therapeutic intervention in order to improve the following deficits and impairments:   Cognitive communication deficit    Problem List There are no active problems to display for this patient.  Leroy Sea, MS/CCC- SLP  Lou Miner 05/06/2016, 10:06 AM  [place] MAIN [facility] SERVICES [address] [place], [place], [address] Phone: [phone]   Fax:  [phone]   Name: Natasha [name] MRN: [id number] Date of Birth: [birth year]

## 2016-05-07 ENCOUNTER — Ambulatory Visit: Payer: Commercial Managed Care - HMO | Admitting: Physical Therapy

## 2016-05-07 ENCOUNTER — Ambulatory Visit: Payer: Commercial Managed Care - HMO | Admitting: Speech Pathology

## 2016-05-07 ENCOUNTER — Ambulatory Visit: Payer: Commercial Managed Care - HMO | Admitting: Occupational Therapy

## 2016-05-12 ENCOUNTER — Encounter: Payer: Self-pay | Admitting: Speech Pathology

## 2016-05-12 ENCOUNTER — Encounter: Payer: Self-pay | Admitting: Physical Therapy

## 2016-05-12 ENCOUNTER — Ambulatory Visit: Payer: Commercial Managed Care - HMO | Admitting: Occupational Therapy

## 2016-05-12 ENCOUNTER — Ambulatory Visit: Payer: Commercial Managed Care - HMO | Admitting: Speech Pathology

## 2016-05-12 ENCOUNTER — Ambulatory Visit: Payer: Commercial Managed Care - HMO | Admitting: Physical Therapy

## 2016-05-12 DIAGNOSIS — M6281 Muscle weakness (generalized): Secondary | ICD-10-CM

## 2016-05-12 DIAGNOSIS — R41841 Cognitive communication deficit: Secondary | ICD-10-CM

## 2016-05-12 DIAGNOSIS — R262 Difficulty in walking, not elsewhere classified: Secondary | ICD-10-CM

## 2016-05-12 DIAGNOSIS — R278 Other lack of coordination: Secondary | ICD-10-CM

## 2016-05-12 NOTE — Therapy (Signed)
[place] MAIN [facility] SERVICES [address] [place], [place], [address] Phone: [phone]   Fax:  [phone]  Speech Language Pathology Treatment  Patient Details  Name: Natasha [name] MRN: [id number] Date of Birth: 12/18/1985 Referring Provider: Barbaraann Boys  Encounter Date: 05/12/2016      End of Session - 05/12/16 1509    Visit Number 16   Number of Visits 20   Date for SLP Re-Evaluation 05/13/15   Authorization Type 2 of 30 SLP sessions for 2018   SLP Start Time 1400   SLP Stop Time  1500   SLP Time Calculation (min) 60 min      Past Medical History:  Diagnosis Date  . Polysubstance abuse   . Restless leg syndrome unk    Past Surgical History:  Procedure Laterality Date  . CHOLECYSTECTOMY    . TUBAL LIGATION      There were no vitals filed for this visit.      Subjective Assessment - 05/12/16 1507    Subjective "I don't like that" RE: scheduling assignment   Currently in Pain? No/denies               ADULT SLP TREATMENT - 05/12/16 0001      General Information   Behavior/Cognition Alert;Cooperative;Pleasant mood   HPI TBI     Treatment Provided   Treatment provided Cognitive-Linquistic     Pain Assessment   Pain Assessment No/denies pain     Cognitive-Linquistic Treatment   Treatment focused on Cognition;Patient/family/caregiver education   Skilled Treatment INDEPENDENCE/SELF-RESPONSIBILITY: The patient is able to state basic requirements for being able to live independently (source of income, household maintenance, pay bills, transportation) and generate plans for barriers (access [facility] for job, transportation options).  Completed a scheduling worksheet with 80% accuracy.  Patient given a blank schedule and multiple items to schedule as homework.  READING COMPREHENSION/ABSTRACT LANAGUAGE: Completed Perplexor, Level A with min assistance..      Assessment / Recommendations / Plan   Plan  Continue with current plan of care     Progression Toward Goals   Progression toward goals Progressing toward goals          SLP Education - 05/12/16 1508    Education provided Yes   Education Details importance of being able to identify tasks to be done and to manage time   Person(s) Educated Patient   Methods Explanation   Comprehension Verbalized understanding            SLP Long Term Goals - 03/19/16 1541      SLP LONG TERM GOAL #1   Title Patient will demonstrate functional cognitive-communication skills for independent completion of personal responsibilities.   Time 10   Period Weeks   Status Partially Met     SLP LONG TERM GOAL #2   Title Patient will independently complete complex executive function skills tasks with 80% accuracy.   Time 10   Period Weeks   Status Partially Met     SLP LONG TERM GOAL #3   Title Patient will complete complex attention tasks with 80% accuracy.   Time 10   Period Weeks   Status Partially Met     SLP LONG TERM GOAL #4   Title Patient will complete memory strategy activities with 80% accuracy.   Time 10   Period Weeks     SLP LONG TERM GOAL #5   Title Patient will generate grammatical and cogent sentences to complete  abstract/complex linguistic tasks with 80% accuracy   Time 10   Period Weeks   Status Partially Met          Plan - 05/12/16 1509    Clinical Impression Statement The patient demonstrated good working and short-term memory for [employer] tasks of a 5th grade level article. She demonstrated difficulty with determining exactly what question was being asked secondary impulsive responses without thinking through the question.  The patient remembered and implemented strategies for process-of-elimination word puzzle given min cues.   Speech Therapy Frequency 2x / week   Duration Other (comment)   Treatment/Interventions Compensatory techniques;Cognitive reorganization;Internal/external aids;Functional  tasks;SLP instruction and feedback;Compensatory strategies;Patient/family education   Potential to Achieve Goals Good   Potential Considerations Ability to learn/carryover information;Cooperation/participation level;Previous level of function;Severity of impairments;Family/community support   SLP Home Exercise Plan Scheduling task   Consulted and Agree with Plan of Care Patient      Patient will benefit from skilled therapeutic intervention in order to improve the following deficits and impairments:   Cognitive communication deficit    Problem List There are no active problems to display for this patient.  Leroy Sea, MS/CCC- SLP  Lou Miner 05/12/2016, 3:10 PM  [place] MAIN [facility] SERVICES [address] [place], [place], [address] Phone: [phone]   Fax:  [phone]   Name: Natasha [name] MRN: [id number] Date of Birth: 1985/10/09
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
## 2016-05-12 NOTE — Therapy (Signed)
[place] MAIN [facility] SERVICES [address] [place], [place], [address] Phone: [phone]   Fax:  [phone]  Physical Therapy Treatment  Patient Details  Name: NORMAN BIER MRN: [id number] Date of Birth: [birth year] Referring Provider: Barbaraann Boys  Encounter Date: 05/12/2016      PT End of Session - 05/12/16 1545    Visit Number 28   Number of Visits 24   Date for PT Re-Evaluation 05/21/16   Authorization Type 33 visits beginning 05/01/16    PT Start Time 0320   PT Stop Time 0400   PT Time Calculation (min) 40 min   Equipment Utilized During Treatment Gait belt   Activity Tolerance Patient limited by fatigue   Behavior During Therapy [facility] for tasks assessed/performed      Past Medical History:  Diagnosis Date  . Polysubstance abuse   . Restless leg syndrome unk    Past Surgical History:  Procedure Laterality Date  . CHOLECYSTECTOMY    . TUBAL LIGATION      There were no vitals filed for this visit.      Subjective Assessment - 05/12/16 1542    Subjective Patient is doing well today and reports she is doing her HEP. She does not use her loftstrand crutches at all when she is at home; only uses them when she is in the community.   Pertinent History TBI   Patient Stated Goals to be able to walk without the loftstrand crutches   Currently in Pain? No/denies   Pain Score 0-No pain   Pain Onset Today   Multiple Pain Sites No     Therapeutic exercise: Matrix stepping forwards and backwards x3 12.5 lbs; patient had to stop and take rest break due to fatigue and stated her legs were shaking. Pt was unsteady and staggering while taking steps Matrix stepping sideways x5 12.5 lbs Leg press 100 lbs 20x3 Heel raises on leg press 100lbs 20x3 TM walking at 1.3 x2 minutes  NMR: Airex step ups to 6 inch stool 10x2 Airex toe taps to 6 inch stool 10x2   Patient needs frequent rest periods and is easily distracted during  treatment. She gets fatigued with standing exercises and prefers to use the leg press when she is tired. She stopped the TM on her own because she was tired and wanted to be done.         PT Education - 05/12/16 1545    Education provided Yes   Education Details importance of continuing HEP   Person(s) Educated Patient   Methods Explanation   Comprehension Verbalized understanding             PT Long Term Goals - 03/31/16 1645      PT LONG TERM GOAL #1   Title Patient will reduce timed up and go to <11 seconds to reduce fall risk and demonstrate improved transfer/gait ability.   Time 12   Period Weeks   Status Partially Met     PT LONG TERM GOAL #2   Title Patient will increase six minute walk test distance to >1000 for progression to community ambulator and improve gait ability   Time 12   Period Weeks   Status Partially Met     PT LONG TERM GOAL #3   Title Patient will increase 10 meter walk test to >1.67ms as to improve gait speed for better community ambulation and to reduce fall    Time 12   Period Weeks  Status Partially Met     PT LONG TERM GOAL #4   Title Patient will tolerate 5 seconds of single leg stance without loss of balance to improve ability to get in and out of shower safely   Time 12   Status Partially Met     PT LONG TERM GOAL #5   Title Patient (< 23 years old) will complete five times sit to stand test in < 10 seconds indicating an increased LE strength and improved balance   Time 12   Status Partially Met            Plan - 05/12/16 1607    Clinical Impression Statement Pt demonstrates difficulty with dynamic standing balance activites, and needs cueing to clear both feet during step ups and toe taps. Pt fatigues quickly and will benefit from continued skilled therapy in order to improve endurance and dynamic balance.   Rehab Potential Good   PT Frequency 2x / week   PT Duration 12 weeks   PT Treatment/Interventions Therapeutic  activities;Therapeutic exercise;Balance training;Neuromuscular re-education;Stair training;Gait training;Patient/family education   PT Next Visit Plan strengthening and balance training; gait training   PT Home Exercise Plan As prescribed   Consulted and Agree with Plan of Care Patient      Patient will benefit from skilled therapeutic intervention in order to improve the following deficits and impairments:  Abnormal gait, Decreased balance, Decreased endurance, Difficulty walking, Decreased cognition, Decreased safety awareness, Decreased strength, Impaired UE functional use, Obesity  Visit Diagnosis: Muscle weakness (generalized)  Other lack of coordination  Difficulty in walking, not elsewhere classified    Problem List There are no active problems to display for this patient. This entire session was performed under direct supervision and direction of a licensed therapist/therapist assistant . I have personally read, edited and approve of the note as written.Alanson Puls, PT, DPT [place], SPT [place] 05/12/2016, 4:10 PM  [place] MAIN [facility] SERVICES [address] [place], [place], [address] Phone: [phone]   Fax:  [phone]  Name: PAMALA HAYMAN MRN: [id number] Date of Birth: [birth year]

## 2016-05-12 NOTE — Patient Instructions (Signed)
OT TREATMENT    Neuro muscular re-education:  [facility]. performed [facility] skills training to improve speed and dexterity needed for ADL tasks and writing. Pt. demonstrated grasping 1 inch sticks,  inch cylindrical collars, and  inch flat washers on the Purdue pegboard. Pt. performed grasping each item with her 2nd digit and thumb, and storing them in the palm. Pt. presented with difficulty storing  inch objects at a time in the palmar aspect of the hand. Pt. Worked on removing the sticks alternating right and left and increasing speed.  Selfcare:  Pt. worked on [employer]. [employer]. Heinznavigating food labels, and answered questions accurately about the label. Pt. required verbal cues for each section on the label. Pt. worked on Event organiserkitchen substitution tables. Pt. required verbal cues.

## 2016-05-15 ENCOUNTER — Encounter: Payer: 59 | Admitting: Occupational Therapy

## 2016-05-15 ENCOUNTER — Ambulatory Visit: Payer: 59 | Admitting: Physical Therapy

## 2016-05-15 ENCOUNTER — Ambulatory Visit: Payer: Commercial Managed Care - HMO | Admitting: Physical Therapy

## 2016-05-15 ENCOUNTER — Ambulatory Visit: Payer: Commercial Managed Care - HMO | Admitting: Occupational Therapy

## 2016-05-15 ENCOUNTER — Ambulatory Visit: Payer: Commercial Managed Care - HMO | Admitting: Speech Pathology

## 2016-05-19 ENCOUNTER — Encounter: Payer: Self-pay | Admitting: Physical Therapy

## 2016-05-19 ENCOUNTER — Encounter: Payer: 59 | Admitting: Occupational Therapy

## 2016-05-19 ENCOUNTER — Encounter: Payer: Self-pay | Admitting: Speech Pathology

## 2016-05-19 ENCOUNTER — Ambulatory Visit: Payer: Commercial Managed Care - HMO | Admitting: Physical Therapy

## 2016-05-19 ENCOUNTER — Ambulatory Visit: Payer: Commercial Managed Care - HMO | Admitting: Speech Pathology

## 2016-05-19 DIAGNOSIS — M6281 Muscle weakness (generalized): Secondary | ICD-10-CM

## 2016-05-19 DIAGNOSIS — R278 Other lack of coordination: Secondary | ICD-10-CM

## 2016-05-19 DIAGNOSIS — R41841 Cognitive communication deficit: Secondary | ICD-10-CM | POA: Diagnosis not present

## 2016-05-19 NOTE — Therapy (Signed)
[place] MAIN [facility] SERVICES [address] [place], [place], [address] Phone: [phone]   Fax:  [phone]  Physical Therapy Treatment  Patient Details  Name: Natasha [name] MRN: [id number] Date of Birth: 22-Sep-1985 Referring Provider: Barbaraann Boys  Encounter Date: 05/19/2016      PT End of Session - 05/19/16 1531    Visit Number 29   Number of Visits 32   Date for PT Re-Evaluation 05/21/16   Authorization Type 33 visits beginning 05/01/16    PT Start Time 0305   PT Stop Time 0345   PT Time Calculation (min) 40 min   Equipment Utilized During Treatment Gait belt   Activity Tolerance Patient limited by fatigue   Behavior During Therapy [facility] for tasks assessed/performed      Past Medical History:  Diagnosis Date  . Polysubstance abuse   . Restless leg syndrome unk    Past Surgical History:  Procedure Laterality Date  . CHOLECYSTECTOMY    . TUBAL LIGATION      There were no vitals filed for this visit.      Subjective Assessment - 05/19/16 1530    Subjective Patient is doing well today and reports she is doing her HEP whenever she gets a chance. She is only using her loftstrand crutches when she goes out somewhere.   Pertinent History TBI   Patient Stated Goals to be able to walk without the loftstrand crutches   Currently in Pain? No/denies   Pain Score 0-No pain   Pain Onset Today   Multiple Pain Sites No      Therapeutic exercise: Matrix stepping forwards and backwards x5 12.5 lbs; patient had to stop and take rest breaks due to fatigue  Matrix stepping sideways x4 12.5 lbs Leg press 100 lbs 20x3 Heel raises on leg press 100lbs 20x3  NMR: Purple foam step ups to 6 inch stool 10x2 Purple foam toe taps to 6 inch stool 10x2   Pt required verbal cueing to maintain correct position during matrix stepping and required frequent rest breaks. Pt requires consistent motivation and encouragement to continue with  exercises. She has decreased dynamic standing balance with single leg tasks and fatigue with repeated repetitions.          PT Education - 05/19/16 1531    Education provided Yes   Education Details importance of getting activities done that need to be completed and continuance of HEP   Person(s) Educated Patient   Methods Explanation   Comprehension Verbalized understanding             PT Long Term Goals - 03/31/16 1645      PT LONG TERM GOAL #1   Title Patient will reduce timed up and go to <11 seconds to reduce fall risk and demonstrate improved transfer/gait ability.   Time 12   Period Weeks   Status Partially Met     PT LONG TERM GOAL #2   Title Patient will increase six minute walk test distance to >1000 for progression to community ambulator and improve gait ability   Time 12   Period Weeks   Status Partially Met     PT LONG TERM GOAL #3   Title Patient will increase 10 meter walk test to >1.51ms as to improve gait speed for better community ambulation and to reduce fall    Time 12   Period Weeks   Status Partially Met     PT LONG TERM GOAL #4  Title Patient will tolerate 5 seconds of single leg stance without loss of balance to improve ability to get in and out of shower safely   Time 12   Status Partially Met     PT LONG TERM GOAL #5   Title Patient (< 21 years old) will complete five times sit to stand test in < 10 seconds indicating an increased LE strength and improved balance   Time 12   Status Partially Met               Plan - 05/19/16 1532    Clinical Impression Statement Pt demonstrates difficulty with controlling gait on matrix machine and continuously needs cueing and motivation to continue exercises. Pt becomes distracted easily and is not always willing to perform the task at hand. Pt will continue to benefit from skilled therapy in order to improve endurance, improve gait, and dynamic balance.   Rehab Potential Good   PT Frequency  2x / week   PT Duration 12 weeks   PT Treatment/Interventions Therapeutic activities;Therapeutic exercise;Balance training;Neuromuscular re-education;Stair training;Gait training;Patient/family education   PT Next Visit Plan strengthening and balance training; gait training   PT Home Exercise Plan As prescribed   Consulted and Agree with Plan of Care Patient      Patient will benefit from skilled therapeutic intervention in order to improve the following deficits and impairments:  Abnormal gait, Decreased balance, Decreased endurance, Difficulty walking, Decreased cognition, Decreased safety awareness, Decreased strength, Impaired UE functional use, Obesity  Visit Diagnosis: Muscle weakness (generalized)  Other lack of coordination    Problem List There are no active problems to display for this patient. Alanson Puls, PT, DPT This entire session was performed under direct supervision and direction of a licensed therapist/therapist assistant . I have personally read, edited and approve of the note as written. Betsy Coder, SPT Betsy Coder 05/19/2016, 3:47 PM  [place] MAIN [facility] SERVICES [address] [place], [place], [address] Phone: [phone]   Fax:  [phone]  Name: Natasha [name] MRN: [id number] Date of Birth: 1986/02/04

## 2016-05-19 NOTE — Therapy (Signed)
[place] MAIN [facility] SERVICES [address] [place], [place], [address] Phone: [phone]   Fax:  [phone]  Speech Language Pathology Treatment  Patient Details  Name: Natasha [name] MRN: [id number] Date of Birth: Jun 14, 1985 Referring Provider: Barbaraann Boys  Encounter Date: 05/19/2016      End of Session - 05/19/16 1757    Visit Number 17   Number of Visits 20   Date for SLP Re-Evaluation 05/29/15   Authorization Type 3 of 30 SLP sessions for 2018   SLP Start Time 1400   SLP Stop Time  1500   SLP Time Calculation (min) 60 min   Activity Tolerance Patient tolerated treatment well      Past Medical History:  Diagnosis Date  . Polysubstance abuse   . Restless leg syndrome unk    Past Surgical History:  Procedure Laterality Date  . CHOLECYSTECTOMY    . TUBAL LIGATION      There were no vitals filed for this visit.      Subjective Assessment - 05/19/16 1756    Subjective "I don't like that" RE: scheduling assignment   Currently in Pain? No/denies               ADULT SLP TREATMENT - 05/19/16 0001      General Information   Behavior/Cognition Alert;Cooperative;Pleasant mood   HPI TBI     Treatment Provided   Treatment provided Cognitive-Linquistic     Pain Assessment   Pain Assessment No/denies pain     Cognitive-Linquistic Treatment   Treatment focused on Cognition;Patient/family/caregiver education   Skilled Treatment INDEPENDENCE/SELF-RESPONSIBILITY: The patient is able to state basic requirements for being able to live independently (source of income, household maintenance, pay bills, transportation) and generate plans for barriers (access [employer] for job, transportation options).  READING She has not completed the scheduling homework I sent with her last session.  She reports that she does not do much with her day (watch TV, maybe go outside)  COMPREHENSION/ABSTRACT LANAGUAGE: Read 5th grade level  article and answer 4 questions with 75% accuracy given min-mod cues to accurately determine the question being asked. Reading fluency is significantly improved.   Complete figurative language activity with SLP cues to identify words with multiple meaning and inflectional changes to change meaning.     Assessment / Recommendations / Plan   Plan Continue with current plan of care     Progression Toward Goals   Progression toward goals Progressing toward goals          SLP Education - 05/19/16 1756    Education provided Yes   Education Details scheduling will help completing activities in timely fashion   Person(s) Educated Patient   Methods Explanation   Comprehension Verbalized understanding            SLP Long Term Goals - 03/19/16 1541      SLP LONG TERM GOAL #1   Title Patient will demonstrate functional cognitive-communication skills for independent completion of personal responsibilities.   Time 10   Period Weeks   Status Partially Met     SLP LONG TERM GOAL #2   Title Patient will independently complete complex executive function skills tasks with 80% accuracy.   Time 10   Period Weeks   Status Partially Met     SLP LONG TERM GOAL #3   Title Patient will complete complex attention tasks with 80% accuracy.   Time 10   Period Weeks   Status  Partially Met     SLP LONG TERM GOAL #4   Title Patient will complete memory strategy activities with 80% accuracy.   Time 10   Period Weeks     SLP LONG TERM GOAL #5   Title Patient will generate grammatical and cogent sentences to complete abstract/complex linguistic tasks with 80% accuracy   Time 10   Period Weeks   Status Partially Met          Plan - 05/19/16 1758    Clinical Impression Statement The patient demonstrated good working and short-term memory for [employer] tasks of a 5th grade level article. She demonstrated difficulty with determining exactly what question was being asked secondary  impulsive responses without thinking through the question.  The patient does not initiate taking control of her life, or at least those aspects she can.   Speech Therapy Frequency 2x / week   Duration Other (comment)   Treatment/Interventions Compensatory techniques;Cognitive reorganization;Internal/external aids;Functional tasks;SLP instruction and feedback;Compensatory strategies;Patient/family education   Potential to Achieve Goals Good   Potential Considerations Ability to learn/carryover information;Cooperation/participation level;Previous level of function;Severity of impairments;Family/community support   SLP Home Exercise Plan Scheduling task/reading task   Consulted and Agree with Plan of Care Patient      Patient will benefit from skilled therapeutic intervention in order to improve the following deficits and impairments:   Cognitive communication deficit    Problem List There are no active problems to display for this patient.  Leroy Sea, [place], Susie 05/19/2016, 6:00 PM  [place] MAIN [facility] SERVICES [address] [place], [place], [address] Phone: [phone]   Fax:  [phone]   Name: Natasha [name] MRN: [id number] Date of Birth: [birth year]

## 2016-05-20 ENCOUNTER — Ambulatory Visit: Payer: 59 | Admitting: Physical Therapy

## 2016-05-20 ENCOUNTER — Encounter: Payer: 59 | Admitting: Occupational Therapy

## 2016-05-20 ENCOUNTER — Encounter: Payer: Self-pay | Admitting: Physical Therapy

## 2016-05-21 ENCOUNTER — Encounter: Payer: 59 | Admitting: Occupational Therapy

## 2016-05-21 ENCOUNTER — Ambulatory Visit: Payer: 59 | Admitting: Physical Therapy

## 2016-05-22 ENCOUNTER — Ambulatory Visit: Payer: Commercial Managed Care - HMO | Admitting: Physical Therapy

## 2016-05-22 ENCOUNTER — Ambulatory Visit: Payer: Commercial Managed Care - HMO | Admitting: Occupational Therapy

## 2016-05-22 ENCOUNTER — Encounter: Payer: Self-pay | Admitting: Physical Therapy

## 2016-05-22 DIAGNOSIS — R278 Other lack of coordination: Secondary | ICD-10-CM

## 2016-05-22 DIAGNOSIS — R41841 Cognitive communication deficit: Secondary | ICD-10-CM | POA: Diagnosis not present

## 2016-05-22 DIAGNOSIS — M6281 Muscle weakness (generalized): Secondary | ICD-10-CM
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
## 2016-05-22 NOTE — Therapy (Signed)
[facility] [facility] SERVICES [address] [place], [place], [address] Phone: [phone]   Fax:  [phone]  Physical Therapy Treatment/Progress Note  Patient Details  Name: Natasha ShadeWhitney B [name] MRN: [id number] Date of Birth: 02/17/1986 Referring Provider: Orene DesanctisBEHLING, KAREN  Encounter Date: 05/22/2016      PT End of Session - 05/22/16 1301    Visit Number 30   Number of Visits 49   Date for PT Re-Evaluation 05/21/16   Authorization Type 33 visits beginning 05/01/16    PT Start Time 0105   PT Stop Time 0145   PT Time Calculation (min) 40 min   Equipment Utilized During Treatment Gait belt   Activity Tolerance Patient limited by fatigue   Behavior [facility] for tasks assessed/performed      Past Medical History:  Diagnosis Date  . Polysubstance abuse   . Restless leg syndrome unk    Past Surgical History:  Procedure Laterality Date  . CHOLECYSTECTOMY    . TUBAL LIGATION      There were no vitals filed for this visit.      Subjective Assessment - 05/22/16 1302    Subjective Patient is doing well today but reports she is tired due to not sleeping well last night. Patient denies pain on this date.   Pertinent History TBI   Patient Stated Goals to be able to walk without the loftstrand crutches   Currently in Pain? No/denies   Pain Score 0-No pain   Pain Onset Today   Multiple Pain Sites No      Treatment:  Therapeutic exercise: NuStep Level 4 x5 minutes Leg press 100 lbs 15x3 Heel raises on leg press 100 lbs 15x3  NMR: Side stepping over cones in // bars x5 laps Stepping over cones forward in // bars x5 laps Blue foam to 6 inch stool to blue foam x20 Blue foam toe taps to 6 inch stool x20  Patient required verbal and tactile cueing to maintain center of gravity during all dynamic balance activities. Patient required verbal cueing to clear foot during toe taps to avoid tripping.  Outcome measures:   5 times  sit<>stand 13.53sec <60 yo, <10 sec indicates increased risk for falls  10 meter walk test                   .660m/s <1.0 m/s indicates increased risk for falls; limited community ambulator  Timed up and Go             14.12sec >14 sec indicates increased risk for falls  6 minute walk test                  865 Feet 1000 feet is community ambulator          PT Education - 05/22/16 1308    Education provided Yes   Education Details importance of continuing HEP   Person(s) Educated Patient   Methods Explanation   Comprehension Verbalized understanding             PT Long Term Goals - 05/22/16 1350      PT LONG TERM GOAL #1   Title Patient will reduce timed up and go to <11 seconds to reduce fall risk and demonstrate improved transfer/gait ability.   Time 12   Period Weeks   Status On-going     PT LONG TERM GOAL #2   Title Patient will increase six minute walk test distance to >1000 for progression  to community ambulator and improve gait ability   Time 12   Period Weeks   Status On-going     PT LONG TERM GOAL #3   Title Patient will increase 10 meter walk test to >1.41m/s as to improve gait speed for better community ambulation and to reduce fall    Time 12   Period Weeks   Status On-going     PT LONG TERM GOAL #4   Title Patient will tolerate 5 seconds of single leg stance without loss of balance to improve ability to get in and out of shower safely   Time 12   Period Weeks   Status On-going     PT LONG TERM GOAL #5   Title Patient (< 95 years old) will complete five times sit to stand test in < 10 seconds indicating an increased LE strength and improved balance   Time 12   Period Weeks   Status On-going               Plan - 05/22/16 1352    Clinical Impression Statement Patient demonstrates difficulty with dynamic standing balance and requires cueing to maintain center of gravity. Patient demonstrated improvement in the distance she is able to  ambulate in 6 minutes, and will benefit from continued skilled therapy in order to continue improving gait speed, endurance, and dynamic standing balance.   Rehab Potential Good   PT Frequency 2x / week   PT Duration 12 weeks   PT Treatment/Interventions Therapeutic activities;Therapeutic exercise;Balance training;Neuromuscular re-education;Stair training;Gait training;Patient/family education   PT Next Visit Plan strengthening and balance training; gait training   PT Home Exercise Plan As prescribed   Consulted and Agree with Plan of Care Patient      Patient will benefit from skilled therapeutic intervention in order to improve the following deficits and impairments:  Abnormal gait, Decreased balance, Decreased endurance, Difficulty walking, Decreased cognition, Decreased safety awareness, Decreased strength, Impaired UE functional use, Obesity  Visit Diagnosis: Muscle weakness (generalized)  Other lack of coordination     Problem List There are no active problems to display for this patient. [name], PT, DPT This entire session was performed under direct supervision and direction of a licensed therapist/therapist assistant . I have personally read, edited and approve of the note as written. Nickola Major, SPT Nickola Major 05/22/2016, 1:58 PM  [place] [facility] MAIN [facility] SERVICES [address] [place], [place], [address] Phone: [phone]   Fax:  [phone]  Name: Natasha [name] MRN: [id number] Date of Birth: [birth year]

## 2016-05-22 NOTE — Patient Instructions (Addendum)
OT TREATMENT    Therapeutic Exercise:   Pt. Worked on the [employer] for 8 min. with constant monitoring of the BUEs. Pt. Worked on changing, and alternating forward reverse position every 2 min. Rest breaks were required.    Selfcare:  Pt. Worked on Arts development officernavigating schedules. Pt. requires verbal cues, and increased time for navigating written schedules. Pt. was independently able to answer 100% home situational judgement scenarios. No verbal cues were required.

## 2016-05-26 ENCOUNTER — Encounter: Payer: 59 | Admitting: Occupational Therapy

## 2016-05-26 ENCOUNTER — Encounter: Payer: Self-pay | Admitting: Speech Pathology

## 2016-05-26 ENCOUNTER — Encounter: Payer: Self-pay | Admitting: Physical Therapy

## 2016-05-26 ENCOUNTER — Ambulatory Visit: Payer: Commercial Managed Care - HMO | Admitting: Occupational Therapy

## 2016-05-26 ENCOUNTER — Encounter: Payer: Self-pay | Admitting: Occupational Therapy

## 2016-05-26 ENCOUNTER — Ambulatory Visit: Payer: Commercial Managed Care - HMO | Admitting: Speech Pathology

## 2016-05-26 ENCOUNTER — Ambulatory Visit: Payer: Commercial Managed Care - HMO | Admitting: Physical Therapy

## 2016-05-26 DIAGNOSIS — R278 Other lack of coordination: Secondary | ICD-10-CM

## 2016-05-26 DIAGNOSIS — R41841 Cognitive communication deficit: Secondary | ICD-10-CM

## 2016-05-26 DIAGNOSIS — M6281 Muscle weakness (generalized): Secondary | ICD-10-CM

## 2016-05-26 NOTE — Therapy (Signed)
[facility] MAIN [facility] SERVICES [address] [place], [place], [address] Phone: [phone]   Fax:  [phone]  Physical Therapy Treatment  Patient Details  Name: Natasha [name] MRN: [id number] Date of Birth: 04/06/1986 Referring Provider: Orene DesanctisBEHLING, KAREN  Encounter Date: 05/26/2016      PT End of Session - 05/26/16 1456    Visit Number 31   Number of Visits 49   Authorization Type 33 visits beginning 05/01/16    PT Start Time (P)  0305   PT Stop Time 0345   PT Time Calculation (min) (P)  40 min   Equipment Utilized During Treatment Gait belt   Activity Tolerance Patient limited by fatigue;Patient tolerated treatment well   Behavior During Therapy WFL for tasks assessed/performed      Past Medical History:  Diagnosis Date  . Polysubstance abuse   . Restless leg syndrome unk    Past Surgical History:  Procedure Laterality Date  . CHOLECYSTECTOMY    . TUBAL LIGATION      There were no vitals filed for this visit.      Subjective Assessment - 05/26/16 1542    Subjective Patient is doing well today and denies pain. She states she did well after the previous therapy session with no extra soreness.   Pertinent History TBI   Patient Stated Goals to be able to walk without the loftstrand crutches   Currently in Pain? No/denies   Pain Score 0-No pain   Multiple Pain Sites No       Therapeutic exercise: Matrix stepping forwards and backwards x5 12.5 lbs; patient had to stop and take rest breaks due to fatigue  Matrix stepping sideways x4 12.5 lbs Leg press 100 lbs 20x3 Heel raises on leg press 100lbs 20x3   NMR: Blue foam to 6 inch stool to blue foam fwd/bwd 10x2 Blue foam toe taps to 6 inch stool 10x2 Side stepping over cones x5 laps  Pt required verbal cueing to maintain correct position during matrix stepping and required frequent rest breaks. Pt required cueing and min UE support to maintain center of gravity during  dynamic standing balance activities.         PT Long Term Goals - 05/22/16 1350      PT LONG TERM GOAL #1   Title Patient will reduce timed up and go to <11 seconds to reduce fall risk and demonstrate improved transfer/gait ability.   Time 12   Period Weeks   Status On-going     PT LONG TERM GOAL #2   Title Patient will increase six minute walk test distance to >1000 for progression to community ambulator and improve gait ability   Time 12   Period Weeks   Status On-going     PT LONG TERM GOAL #3   Title Patient will increase 10 meter walk test to >1.3972m/s as to improve gait speed for better community ambulation and to reduce fall    Time 12   Period Weeks   Status On-going     PT LONG TERM GOAL #4   Title Patient will tolerate 5 seconds of single leg stance without loss of balance to improve ability to get in and out of shower safely   Time 12   Period Weeks   Status On-going     PT LONG TERM GOAL #5   Title Patient (< 31 years old) will complete five times sit to stand test in < 10 seconds indicating an increased  LE strength and improved balance   Time 12   Period Weeks   Status On-going            Plan - 05/26/16 1547    Clinical Impression Statement Patient demonstrates difficulty with dynamic standing balance and requires use of UE as well as verbal cueing to maintain center of gravity. Patient required min cueing to maintain correct position during matrix stepping. Patient will continue to benefit from skilled therapy in order to improve endurance and dynamic standing balance.   Rehab Potential Good   PT Frequency 2x / week   PT Duration 12 weeks   PT Treatment/Interventions Therapeutic activities;Therapeutic exercise;Balance training;Neuromuscular re-education;Stair training;Gait training;Patient/family education   PT Next Visit Plan strengthening and balance training; gait training   PT Home Exercise Plan As prescribed   Consulted and Agree with Plan of  Care Patient      Patient will benefit from skilled therapeutic intervention in order to improve the following deficits and impairments:  Abnormal gait, Decreased balance, Decreased endurance, Difficulty walking, Decreased cognition, Decreased safety awareness, Decreased strength, Impaired UE functional use, Obesity  Visit Diagnosis: Muscle weakness (generalized)  Other lack of coordination   Problem List There are no active problems to display for this patient.  Natasha [name], PT, DPT This entire session was performed under direct supervision and direction of a licensed therapist/therapist assistant . I have personally read, edited and approve of the note as written. Natasha [name], SPT Natasha [name] 05/26/2016, 3:55 PM  [facility] MAIN [facility] SERVICES [address] [place], [place], [address] Phone: [phone]   Fax:  [phone]  Name: Natasha [name] MRN: [id number] Date of Birth: July 28, 1985
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
## 2016-05-26 NOTE — Patient Instructions (Signed)
OT TREATMENT     Neuro muscular re-education:  Therapeutic Exercise:  Pt. performed 3.5# dowel ex. For UE strengthening secondary to weakness. Bilateral shoulder flexion, chest press, circular patterns, and elbow flexion/extension were performed. 2# dumbbell ex. for elbow flexion and extension, forearm supination/pronation, wrist flexion/extension, and radial deviation. Pt. requires rest breaks and verbal cues, and visual demonstration for proper technique.  Selfcare:  Pt. Worked on navigating schedules for events, and locations. Pt. Requires cues and increased time to complete IADL schedules/events.  Manual Therapy:

## 2016-05-26 NOTE — Therapy (Signed)
[place] MAIN [facility] SERVICES [address] [place], [place], [address] Phone: [phone]   Fax:  [phone]  Speech Language Pathology Treatment  Patient Details  Name: Natasha [name] MRN: [id number] Date of Birth: 1985/08/13 Referring Provider: Barbaraann Boys  Encounter Date: 05/26/2016      End of Session - 05/26/16 1554    Visit Number 18   Number of Visits 20   Date for SLP Re-Evaluation 05/29/15   Authorization Type 4 of 30 SLP sessions for 2018   SLP Start Time 1345   SLP Stop Time  6195   SLP Time Calculation (min) 60 min      Past Medical History:  Diagnosis Date  . Polysubstance abuse   . Restless leg syndrome unk    Past Surgical History:  Procedure Laterality Date  . CHOLECYSTECTOMY    . TUBAL LIGATION      There were no vitals filed for this visit.      Subjective Assessment - 05/26/16 1553    Subjective "I don't like that" RE: scheduling assignment   Currently in Pain? No/denies               ADULT SLP TREATMENT - 05/26/16 0001      General Information   Behavior/Cognition Alert;Cooperative;Pleasant mood   HPI TBI     Treatment Provided   Treatment provided Cognitive-Linquistic     Pain Assessment   Pain Assessment No/denies pain     Cognitive-Linquistic Treatment   Treatment focused on Cognition;Patient/family/caregiver education   Skilled Treatment INDEPENDENCE/SELF-RESPONSIBILITY: The patient is able to state basic requirements for being able to live independently (source of income, household maintenance, pay bills, transportation) and generate plans for barriers (access [employer] for job, transportation options).  She has not completed the scheduling homework I sent with her previously.  The patient was able to complete a scheduling worksheet with min SLP cues to scan for information, interpret questions, add information to the schedule.  She reports that she does not do much with her day  (watch TV, maybe go outside)  COMPREHENSION/ABSTRACT LANAGUAGE: Read 5th grade level article and answer 4 questions with 75% accuracy given min-mod cues to accurately determine the question being asked. Reading fluency is significantly improved.   Complete figurative language activity with SLP cues to identify words with multiple meaning and inflectional changes to change meaning. Complete 2 Perplexor Level A puzzles with min-to-mod SLP cues, including cues to be thorough in marking her grid and interpret / reason information provided.     Assessment / Recommendations / Plan   Plan Continue with current plan of care     Progression Toward Goals   Progression toward goals Progressing toward goals          SLP Education - 05/26/16 1553    Education provided Yes   Education Details Independence includes taking responsibility for completing tasks assigned    Person(s) Educated Patient   Methods Explanation   Comprehension Verbalized understanding            SLP Long Term Goals - 03/19/16 1541      SLP LONG TERM GOAL #1   Title Patient will demonstrate functional cognitive-communication skills for independent completion of personal responsibilities.   Time 10   Period Weeks   Status Partially Met     SLP LONG TERM GOAL #2   Title Patient will independently complete complex executive function skills tasks with 80% accuracy.   Time 10  Period Weeks   Status Partially Met     SLP LONG TERM GOAL #3   Title Patient will complete complex attention tasks with 80% accuracy.   Time 10   Period Weeks   Status Partially Met     SLP LONG TERM GOAL #4   Title Patient will complete memory strategy activities with 80% accuracy.   Time 10   Period Weeks     SLP LONG TERM GOAL #5   Title Patient will generate grammatical and cogent sentences to complete abstract/complex linguistic tasks with 80% accuracy   Time 10   Period Weeks   Status Partially Met          Plan - 05/26/16  1555    Clinical Impression Statement The patient demonstrated good working and short-term memory for [employer] tasks of a 5th grade level article. She demonstrated difficulty with determining exactly what question was being asked secondary impulsive responses without thinking through the question.  The patient does not initiate taking control of her life, or at least those aspects she can.   Speech Therapy Frequency 2x / week   Duration Other (comment)   Treatment/Interventions Compensatory techniques;Cognitive reorganization;Internal/external aids;Functional tasks;SLP instruction and feedback;Compensatory strategies;Patient/family education   Potential to Achieve Goals Good   Potential Considerations Ability to learn/carryover information;Cooperation/participation level;Previous level of function;Severity of impairments;Family/community support   SLP Home Exercise Plan perplexor level A   Consulted and Agree with Plan of Care Patient      Patient will benefit from skilled therapeutic intervention in order to improve the following deficits and impairments:   Cognitive communication deficit    Problem List There are no active problems to display for this patient.  Leroy Sea, MS/CCC- SLP  Lou Miner 05/26/2016, 3:56 PM  [place] MAIN [facility] SERVICES [address] [place], [place], [address] Phone: [phone]   Fax:  [phone]   Name: Natasha [name] MRN: [id number] Date of Birth: 08/18/1985

## 2016-05-27 ENCOUNTER — Encounter: Payer: 59 | Admitting: Occupational Therapy

## 2016-05-27 ENCOUNTER — Encounter: Payer: Self-pay | Admitting: Physical Therapy

## 2016-05-27 ENCOUNTER — Ambulatory Visit: Payer: 59 | Admitting: Physical Therapy

## 2016-05-28 ENCOUNTER — Encounter: Payer: Self-pay | Admitting: Physical Therapy

## 2016-05-28 ENCOUNTER — Ambulatory Visit: Payer: 59 | Admitting: Physical Therapy

## 2016-05-28 ENCOUNTER — Ambulatory Visit: Payer: Commercial Managed Care - HMO | Admitting: Occupational Therapy

## 2016-05-28 ENCOUNTER — Encounter: Payer: Self-pay | Admitting: Occupational Therapy

## 2016-05-28 ENCOUNTER — Ambulatory Visit: Payer: Commercial Managed Care - HMO | Admitting: Physical Therapy

## 2016-05-28 ENCOUNTER — Encounter: Payer: 59 | Admitting: Occupational Therapy

## 2016-05-28 ENCOUNTER — Ambulatory Visit: Payer: Commercial Managed Care - HMO | Admitting: Speech Pathology

## 2016-05-28 DIAGNOSIS — M6281 Muscle weakness (generalized): Secondary | ICD-10-CM

## 2016-05-28 DIAGNOSIS — R278 Other lack of coordination: Secondary | ICD-10-CM

## 2016-05-28 DIAGNOSIS — R41841 Cognitive communication deficit: Secondary | ICD-10-CM

## 2016-05-28 NOTE — Patient Instructions (Addendum)
OT TREATMENT    Neuro muscular re-education:  Pt. Worked on grasping, flipping, turning, and stacking pegs on the [employer]in[employer] board. Pt. requires verbal cues and visual demonstration.  Therapeutic Exercise:  Pt. Worked on the [employer] for 10 min. With constant monitoring of the BUEs. Pt. Worked on changing, and alternating forward reverse position every 2 min. Rest breaks were required, and cues for hand placement.  Selfcare:  Pt. worked on Patent attorneynavigating complex schedules. Pt. Requires verbal and increased time.
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
## 2016-05-28 NOTE — Therapy (Signed)
[place] MAIN [facility] SERVICES [address] [place], [place], [address] Phone: [phone]   Fax:  [phone]  Speech Language Pathology Treatment  Patient Details  Name: Natasha [name] MRN: [id number] Date of Birth: November 17, 1985 Referring Provider: Barbaraann Boys  Encounter Date: 05/28/2016      End of Session - 05/28/16 1556    Visit Number 19   Number of Visits 20   Date for SLP Re-Evaluation 05/29/15   Authorization Type 5 of 30 SLP sessions for 2018   SLP Start Time 1407   SLP Stop Time  1500   SLP Time Calculation (min) 53 min   Activity Tolerance Patient tolerated treatment well      Past Medical History:  Diagnosis Date  . Polysubstance abuse   . Restless leg syndrome unk    Past Surgical History:  Procedure Laterality Date  . CHOLECYSTECTOMY    . TUBAL LIGATION      There were no vitals filed for this visit.      Subjective Assessment - 05/28/16 1555    Subjective "I like those" RE: Perplxor puzzles   Currently in Pain? No/denies               ADULT SLP TREATMENT - 05/28/16 0001      General Information   Behavior/Cognition Alert;Cooperative;Pleasant mood   HPI TBI     Treatment Provided   Treatment provided Cognitive-Linquistic     Pain Assessment   Pain Assessment No/denies pain     Cognitive-Linquistic Treatment   Treatment focused on Cognition;Patient/family/caregiver education   Skilled Treatment INDEPENDENCE/SELF-RESPONSIBILITY: The patient is able to state basic requirements for being able to live independently (source of income, household maintenance, pay bills, transportation) and generate plans for barriers (access Voc Rehab for job, transportation options).  She has not completed the scheduling homework I sent with her previously, but did work on the [employer] sent home last time.  COMPREHENSION/ABSTRACT LANAGUAGE: Complete 4 Perplexor Level A puzzles with overall min SLP cues, including  cues to be thorough in marking her grid, use tips given previously,  and interpret / reason information provided.     Assessment / Recommendations / Plan   Plan Continue with current plan of care     Progression Toward Goals   Progression toward goals Progressing toward goals          SLP Education - 05/28/16 1556    Education provided Yes   Education Details Independence includes taking responsibility for completing tasks assigned    Person(s) Educated Patient   Methods Explanation   Comprehension Verbalized understanding            SLP Long Term Goals - 03/19/16 1541      SLP LONG TERM GOAL #1   Title Patient will demonstrate functional cognitive-communication skills for independent completion of personal responsibilities.   Time 10   Period Weeks   Status Partially Met     SLP LONG TERM GOAL #2   Title Patient will independently complete complex executive function skills tasks with 80% accuracy.   Time 10   Period Weeks   Status Partially Met     SLP LONG TERM GOAL #3   Title Patient will complete complex attention tasks with 80% accuracy.   Time 10   Period Weeks   Status Partially Met     SLP LONG TERM GOAL #4   Title Patient will complete memory strategy activities with 80% accuracy.   Time  10   Period Weeks     SLP LONG TERM GOAL #5   Title Patient will generate grammatical and cogent sentences to complete abstract/complex linguistic tasks with 80% accuracy   Time 10   Period Weeks   Status Partially Met          Plan - 05/28/16 1557    Clinical Impression Statement The patient demonstrated good working and short-term memory for [employer] tasks of a 5th grade level article. She demonstrated difficulty with determining exactly what question was being asked secondary impulsive responses without thinking through the question.  The patient does not initiate taking control of her life, or at least those aspects she can.   Speech Therapy  Frequency 2x / week   Duration Other (comment)   Treatment/Interventions Compensatory techniques;Cognitive reorganization;Internal/external aids;Functional tasks;SLP instruction and feedback;Compensatory strategies;Patient/family education   Potential to Achieve Goals Good   Potential Considerations Ability to learn/carryover information;Cooperation/participation level;Previous level of function;Severity of impairments;Family/community support   SLP Home Exercise Plan perplexor level A   Consulted and Agree with Plan of Care Patient      Patient will benefit from skilled therapeutic intervention in order to improve the following deficits and impairments:   Cognitive communication deficit    Problem List There are no active problems to display for this patient.  Leroy Sea, MS/CCC- SLP  Lou Miner 05/28/2016, 3:58 PM  [place] MAIN [facility] SERVICES [address] [place], [place], [address] Phone: [phone]   Fax:  [phone]   Name: Natasha [name] MRN: [id number] Date of Birth: 1985/11/08

## 2016-05-28 NOTE — Therapy (Signed)
[place] [facility] MAIN [facility] SERVICES [address] [place], [place], [address] Phone: [phone]   Fax:  [phone]  Physical Therapy Treatment  Patient Details  Name: Natasha [name] MRN: [id number] Date of Birth: 1985/11/20 Referring Provider: Orene Desanctis  Encounter Date: 05/28/2016      PT End of Session - 05/28/16 1531    Visit Number 32   Number of Visits 49   Date for PT Re-Evaluation 05/21/16   Authorization Type 33 visits beginning 05/01/16    PT Start Time 0305   PT Stop Time 0345   PT Time Calculation (min) 40 min   Equipment Utilized During Treatment Gait belt   Activity Tolerance Patient limited by fatigue;Patient tolerated treatment well   Behavior During Therapy [facility] for tasks assessed/performed      Past Medical History:  Diagnosis Date  . Polysubstance abuse   . Restless leg syndrome unk    Past Surgical History:  Procedure Laterality Date  . CHOLECYSTECTOMY    . TUBAL LIGATION      There were no vitals filed for this visit.      Subjective Assessment - 05/28/16 1530    Subjective Patient is doing well today and denies pain. She states she is running in place and stretching at home for exercise.   Pertinent History TBI   Patient Stated Goals to be able to walk without the loftstrand crutches   Currently in Pain? No/denies   Pain Score 0-No pain   Multiple Pain Sites No       Therapeutic exercise: Matrix stepping forwards and backwards x5 12.5 lbs; patient required rest breaks due to fatigue  Matrix stepping sideways x3 12.5 lbs Leg press 120 lbs 20x3 Heel raises on leg press 120lbs 20x3   NMR: Blue foam to 6 inch stool to blue foam fwd/bwd in // bars 10x2 Blue foam to 6 inch stool to blue foam side to side in // bars 10x2 each Purple foam toe taps to 6 inch stool 20x2 Rocker board balance in center sideways and fwd/bwd in // bars x2 minutes each  Patient required CGA during matrix stepping and all  dynamic balance activities. Patient required UE support during side stepping from blue foam to 6 inch stool to blue foam.            PT Long Term Goals - 05/22/16 1350      PT LONG TERM GOAL #1   Title Patient will reduce timed up and go to <11 seconds to reduce fall risk and demonstrate improved transfer/gait ability.   Time 12   Period Weeks   Status On-going     PT LONG TERM GOAL #2   Title Patient will increase six minute walk test distance to >1000 for progression to community ambulator and improve gait ability   Time 12   Period Weeks   Status On-going     PT LONG TERM GOAL #3   Title Patient will increase 10 meter walk test to >1.65m/s as to improve gait speed for better community ambulation and to reduce fall    Time 12   Period Weeks   Status On-going     PT LONG TERM GOAL #4   Title Patient will tolerate 5 seconds of single leg stance without loss of balance to improve ability to get in and out of shower safely   Time 12   Period Weeks   Status On-going     PT LONG TERM GOAL #  5   Title Patient (< 31 years old) will complete five times sit to stand test in < 10 seconds indicating an increased LE strength and improved balance   Time 12   Period Weeks   Status On-going               Plan - 05/28/16 1539    Clinical Impression Statement Patient required CGA during matrix stepping and all dynamic balance activities. Pt continues to demonstrate difficulty with single leg balance and stability during session today. Patient required UE support during side stepping in // bars. Patient will benefit from continued skilled therapy in order to improve dynamic standing balance and increase endurance.    Rehab Potential Good   PT Frequency 2x / week   PT Duration 12 weeks   PT Treatment/Interventions Therapeutic activities;Therapeutic exercise;Balance training;Neuromuscular re-education;Stair training;Gait training;Patient/family education   PT Next Visit Plan  strengthening and balance training; gait training   PT Home Exercise Plan As prescribed   Consulted and Agree with Plan of Care Patient      Patient will benefit from skilled therapeutic intervention in order to improve the following deficits and impairments:  Abnormal gait, Decreased balance, Decreased endurance, Difficulty walking, Decreased cognition, Decreased safety awareness, Decreased strength, Impaired UE functional use, Obesity  Visit Diagnosis: Muscle weakness (generalized)  Other lack of coordination     Problem List There are no active problems to display for this patient. This entire session was performed under direct supervision and direction of a licensed therapist/therapist assistant . I have personally read, edited and approve of the note as written. Nickola MajorKara Allenmichael Mcpartlin, SPT Ezekiel InaKristine S [place], [place], DPT [name], [name] 05/28/2016, 5:16 PM  [place] [facility] [address] [place], [place], [address] Phone: [phone]   Fax:  [phone]  Name: Natasha [name] MRN: [id number] Date of Birth: 09/04/1985

## 2016-05-29 ENCOUNTER — Encounter: Payer: 59 | Admitting: Occupational Therapy

## 2016-05-29 ENCOUNTER — Ambulatory Visit: Payer: 59 | Admitting: Physical Therapy

## 2016-06-03 ENCOUNTER — Ambulatory Visit: Payer: 59 | Attending: Pediatrics | Admitting: Occupational Therapy

## 2016-06-03 ENCOUNTER — Ambulatory Visit: Payer: 59 | Admitting: Physical Therapy

## 2016-06-03 ENCOUNTER — Encounter: Payer: Self-pay | Admitting: Physical Therapy

## 2016-06-03 DIAGNOSIS — R29810 Facial weakness: Secondary | ICD-10-CM | POA: Diagnosis present

## 2016-06-03 DIAGNOSIS — R41841 Cognitive communication deficit: Secondary | ICD-10-CM | POA: Insufficient documentation

## 2016-06-03 DIAGNOSIS — M6281 Muscle weakness (generalized): Secondary | ICD-10-CM | POA: Insufficient documentation

## 2016-06-03 DIAGNOSIS — R262 Difficulty in walking, not elsewhere classified: Secondary | ICD-10-CM | POA: Insufficient documentation

## 2016-06-03 DIAGNOSIS — R278 Other lack of coordination: Secondary | ICD-10-CM | POA: Insufficient documentation

## 2016-06-03 NOTE — Patient Instructions (Addendum)
OT TREATMENT    Neuro muscular re-education:  Pt. worked on grasping 2" sticks and placing them in a pegboard. Pt. worked on removing the pegs using resistive tweezers with her right hand.     Selfcare:  Pt. worked on [employer]igating menus. Pt. was able to answer questions accurately. Pt. worked on writing in smaller, controlled spaces.  Pt. Requires verbal, and visual cues.

## 2016-06-03 NOTE — Therapy (Signed)
[place] [facility] MA[facility] [address] [place], [place], [address] Phone: [phone]   Fax:  [phone]  Physical Therapy Treatment  Patient Details  Name: Natasha ShadeWhitney B [name] MRN: [id number] Date of Birth: 07/31/1985 Referring Provider: Orene DesanctisBEHLING, KAREN  Encounter Date: 06/03/2016      PT End of Session - 06/03/16 1139    Visit Number 33   Number of Visits 49   Date for PT Re-Evaluation 07/16/16   Authorization Type 33 visits beginning 05/01/16    PT Start Time 1115   PT Stop Time 1155   PT Time Calculation (min) 40 min   Equipment Utilized During Treatment Gait belt   Activity Tolerance Patient tolerated treatment well;Patient limited by fatigue   Behavior [facility] Therapy [facility] for tasks assessed/performed      Past Medical History:  Diagnosis Date  . Polysubstance abuse   . Restless leg syndrome unk    Past Surgical History:  Procedure Laterality Date  . CHOLECYSTECTOMY    . TUBAL LIGATION      There were no vitals filed for this visit.      Subjective Assessment - 06/03/16 1136    Subjective Patient is doing well today and denies pain. She states she is doing her exercises and stretching at home.    Pertinent History TBI   Patient Stated Goals to be able to walk without the loftstrand crutches   Currently in Pain? No/denies   Pain Score 0-No pain   Multiple Pain Sites No      Therapeutic exercise: Matrix stepping forwards and backwards x5 12.5 lbs; patient required rest breaks due to fatigue  Matrix stepping sideways x3 12.5 lbs Leg press 120 lbs 20x3 Heel raises on leg press 120lbs 20x3   NMR: Blue foam to 6 inch stool 20x2 Blue foam toe taps to 6 inch stool 20x2 Rocker board fwd/bwd 20x2 Blue foam side stepping x5 laps   Patient required CGA during matrix stepping and all dynamic balance activities. Patient required UE support during side stepping on blue foam.          PT Education - 06/03/16 1138     Education provided Yes   Education Details HEP   Person(s) Educated Patient   Methods Explanation   Comprehension Verbalized understanding             PT Long Term Goals - 05/22/16 1350      PT LONG TERM GOAL #1   Title Patient will reduce timed up and go to <11 seconds to reduce fall risk and demonstrate improved transfer/gait ability.   Time 12   Period Weeks   Status On-going     PT LONG TERM GOAL #2   Title Patient will increase six minute walk test distance to >1000 for progression to community ambulator and improve gait ability   Time 12   Period Weeks   Status On-going     PT LONG TERM GOAL #3   Title Patient will increase 10 meter walk test to >1.1070m/s as to improve gait speed for better community ambulation and to reduce fall    Time 12   Period Weeks   Status On-going     PT LONG TERM GOAL #4   Title Patient will tolerate 5 seconds of single leg stance without loss of balance to improve ability to get in and out of shower safely   Time 12   Period Weeks   Status On-going  PT LONG TERM GOAL #5   Title Patient (< 17 years old) will complete five times sit to stand test in < 10 seconds indicating an increased LE strength and improved balance   Time 12   Period Weeks   Status On-going               Plan - 06/03/16 1303    Clinical Impression Statement Patient required CGA during matrix stepping and all dynamic balance activities. She continues to demonstrate difficulty with dynamic standing balance activities and required UE support during side stepping. Patient will benefit from continued skilly therapy in order to improve dynamic standing balance and decrease risk for falls.   Rehab Potential Good   PT Frequency 2x / week   PT Duration 12 weeks   PT Treatment/Interventions Therapeutic activities;Therapeutic exercise;Balance training;Neuromuscular re-education;Stair training;Gait training;Patient/family education   PT Next Visit Plan  strengthening and balance training; gait training   PT Home Exercise Plan As prescribed   Consulted and Agree with Plan of Care Patient      Patient will benefit from skilled therapeutic intervention in order to improve the following deficits and impairments:  Abnormal gait, Decreased balance, Decreased endurance, Difficulty walking, Decreased cognition, Decreased safety awareness, Decreased strength, Impaired UE functional use, Obesity  Visit Diagnosis: Muscle weakness (generalized)  Other lack of coordination  Difficulty in walking, not elsewhere classified     Problem List There are no active problems to display for this patient. [name], PT, DPT This entire session was performed under direct supervision and direction of a licensed therapist/therapist assistant . I have personally read, edited and approve of the note as written. Nickola Major, SPT [place] 06/03/2016, 3:34 PM  [place] [facility] MAIN [facility] SERVICES [address] [place], [place], [address] Phone: [phone]   Fax:  [phone]  Name: Natasha [name] MRN: [id number] Date of Birth: October 29, 1985
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
## 2016-06-05 ENCOUNTER — Ambulatory Visit: Payer: 59 | Admitting: Physical Therapy

## 2016-06-05 ENCOUNTER — Ambulatory Visit: Payer: 59 | Admitting: Speech Pathology

## 2016-06-09 ENCOUNTER — Encounter: Payer: 59 | Admitting: Occupational Therapy

## 2016-06-09 ENCOUNTER — Ambulatory Visit: Payer: 59 | Admitting: Physical Therapy

## 2016-06-09 ENCOUNTER — Ambulatory Visit: Payer: 59 | Admitting: Occupational Therapy

## 2016-06-10 DIAGNOSIS — N201 Calculus of ureter: Secondary | ICD-10-CM | POA: Insufficient documentation

## 2016-06-11 ENCOUNTER — Encounter: Payer: 59 | Admitting: Occupational Therapy

## 2016-06-11 ENCOUNTER — Ambulatory Visit: Payer: 59 | Admitting: Speech Pathology

## 2016-06-11 ENCOUNTER — Ambulatory Visit: Payer: 59 | Admitting: Physical Therapy

## 2016-06-16 ENCOUNTER — Encounter: Payer: 59 | Admitting: Occupational Therapy

## 2016-06-16 ENCOUNTER — Ambulatory Visit: Payer: 59 | Admitting: Physical Therapy

## 2016-06-17 ENCOUNTER — Ambulatory Visit: Payer: 59 | Admitting: Speech Pathology

## 2016-06-19 ENCOUNTER — Ambulatory Visit: Payer: 59 | Admitting: Physical Therapy

## 2016-06-19 ENCOUNTER — Ambulatory Visit: Payer: 59 | Admitting: Speech Pathology

## 2016-06-19 ENCOUNTER — Ambulatory Visit: Payer: 59 | Admitting: Occupational Therapy

## 2016-06-23 ENCOUNTER — Encounter: Payer: Self-pay | Admitting: Speech Pathology

## 2016-06-23 ENCOUNTER — Ambulatory Visit: Payer: 59 | Admitting: Speech Pathology

## 2016-06-23 ENCOUNTER — Ambulatory Visit: Payer: 59 | Admitting: Physical Therapy

## 2016-06-23 ENCOUNTER — Encounter: Payer: 59 | Admitting: Occupational Therapy

## 2016-06-23 DIAGNOSIS — R41841 Cognitive communication deficit: Secondary | ICD-10-CM

## 2016-06-23 DIAGNOSIS — M6281 Muscle weakness (generalized): Secondary | ICD-10-CM | POA: Diagnosis not present

## 2016-06-23 NOTE — Therapy (Signed)
[place] MAIN [facility] SERVICES [address] [place], [place], [address] Phone: [phone]   Fax:  [phone]  Speech Language Pathology Treatment/Re-Certification  Patient Details  Name: ADDILYNN MOWRER MRN: [id number] Date of Birth: [birth year] Referring Provider: Barbaraann Boys  Encounter Date: 06/23/2016      End of Session - 06/23/16 1338    Visit Number 20   Number of Visits 37   Date for SLP Re-Evaluation 08/22/15   Authorization Type 6 of 30 SLP sessions for 2018   SLP Start Time 1215   SLP Stop Time  1310   SLP Time Calculation (min) 55 min   Activity Tolerance Patient tolerated treatment well      Past Medical History:  Diagnosis Date  . Polysubstance abuse   . Restless leg syndrome unk    Past Surgical History:  Procedure Laterality Date  . CHOLECYSTECTOMY    . TUBAL LIGATION      There were no vitals filed for this visit.      Subjective Assessment - 06/23/16 1337    Subjective Patient reports that she will be moving to a local assissted living situation   Currently in Pain? No/denies               ADULT SLP TREATMENT - 06/23/16 0001      General Information   Behavior/Cognition Alert;Cooperative;Pleasant mood   HPI TBI     Treatment Provided   Treatment provided Cognitive-Linquistic     Pain Assessment   Pain Assessment No/denies pain     Cognitive-Linquistic Treatment   Treatment focused on Cognition;Patient/family/caregiver education   Skilled Treatment INDEPENDENCE/SELF-RESPONSIBILITY: The patient will be moving to an assisted living home in the next few weeks.  COMPREHENSION/ABSTRACT LANAGUAGE: Completed a Perplexor Level A puzzle as homework, given one cue.  During our session, completed another with overall min SLP cues, including cues to be thorough in marking her grid, use tips given previously, and interpret / reason information provided.  Generate sentences to explain why a solution is  not good with 70% accuracy independently and accurately given cues to be thorough.  Brainstorm 3 solutions for hypothetical problem with 70% accuracy.     Assessment / Recommendations / Plan   Plan Continue with current plan of care     Progression Toward Goals   Progression toward goals Progressing toward goals          SLP Education - 06/23/16 1337    Education provided Yes   Education Details Independence includes taking responsibility for completing tasks assigned    Person(s) Educated Patient   Methods Explanation   Comprehension Verbalized understanding            SLP Long Term Goals - 06/23/16 1542      SLP LONG TERM GOAL #1   Title Patient will demonstrate functional cognitive-communication skills for independent completion of personal responsibilities.   Time 8   Period Weeks   Status Partially Met     SLP LONG TERM GOAL #2   Title Patient will independently complete complex executive function skills tasks with 80% accuracy.   Time 8   Period Weeks   Status Partially Met     SLP LONG TERM GOAL #3   Title Patient will complete complex attention tasks with 80% accuracy.   Time 8   Period Weeks   Status Partially Met     SLP LONG TERM GOAL #4   Title Patient will complete memory strategy  activities with 80% accuracy.   Time 8   Period Weeks   Status Partially Met     SLP LONG TERM GOAL #5   Title Patient will generate grammatical and cogent sentences to complete abstract/complex linguistic tasks with 80% accuracy   Time 8   Period Weeks   Status Partially Met          Plan - 06/23/16 1537    Clinical Impression Statement The patient demonstrated good working and short-term memory for [employer] of familiar tasks.  She demonstrated difficulty with determining exactly what question was being asked secondary impulsive responses without thinking through the question.  The patient does not initiate taking control of her life, or at least those  aspects she can.  In general, the patient demonstrates improved scanning for information in a complex setting and improved perseverance.  The patient is demonstrating behaviors that interfere with reading comprehension and new learning.  Behaviors include: impulsive responses, mis-reading words, reduced flexibility, reduced access to common knowledge, and faulty memory for information.  The patient is taking feedback regarding her responses and attempts to modify her response.   Speech Therapy Frequency 2x / week   Duration Other (comment)   Treatment/Interventions Compensatory techniques;Cognitive reorganization;Internal/external aids;Functional tasks;SLP instruction and feedback;Compensatory strategies;Patient/family education   Potential to Achieve Goals Good   Potential Considerations Ability to learn/carryover information;Cooperation/participation level;Previous level of function;Severity of impairments;Family/community support   SLP Home Exercise Plan perplexor level A   Consulted and Agree with Plan of Care Patient      Patient will benefit from skilled therapeutic intervention in order to improve the following deficits and impairments:   Cognitive communication deficit - Plan: SLP plan of care cert/re-cert    Problem List There are no active problems to display for this patient.  Leroy Sea, MS/CCC- SLP  Lou Miner 06/23/2016, 3:47 PM  [place] MAIN [facility] SERVICES [address] [place], [place], [address] Phone: [phone]   Fax:  [phone]   Name: KRISANNE LICH MRN: [id number] Date of Birth: December 24, 1985

## 2016-06-25 ENCOUNTER — Ambulatory Visit: Payer: 59 | Admitting: Physical Therapy

## 2016-06-25 ENCOUNTER — Ambulatory Visit: Payer: 59 | Admitting: Occupational Therapy

## 2016-06-25 ENCOUNTER — Ambulatory Visit: Payer: 59 | Admitting: Speech Pathology

## 2016-06-25 ENCOUNTER — Encounter: Payer: Self-pay | Admitting: Speech Pathology

## 2016-06-25 ENCOUNTER — Encounter: Payer: Self-pay | Admitting: Physical Therapy

## 2016-06-25 DIAGNOSIS — R29810 Facial weakness: Secondary | ICD-10-CM

## 2016-06-25 DIAGNOSIS — R262 Difficulty in walking, not elsewhere classified: Secondary | ICD-10-CM

## 2016-06-25 DIAGNOSIS — R278 Other lack of coordination: Secondary | ICD-10-CM

## 2016-06-25 DIAGNOSIS — R41841 Cognitive communication deficit: Secondary | ICD-10-CM

## 2016-06-25 DIAGNOSIS — M6281 Muscle weakness (generalized): Secondary | ICD-10-CM

## 2016-06-25 NOTE — Patient Instructions (Signed)
OT TREATMENT     Neuro muscular re-education:  Therapeutic Exercise:  Selfcare:  Pt. worked on typing tasks. Pt.1 minute typing speed: 3 wpm, 5 minute typing speed: 9 wpm. Pt. Worked on multiple typing word, and sentence drills.   Manual Therapy:

## 2016-06-25 NOTE — Therapy (Signed)
[place] MAIN [facility] SERVICES [address] [place], [place], [address] Phone: [phone]   Fax:  [phone]  Speech Language Pathology Treatment  Patient Details  Name: DANESE DORSAINVIL MRN: [id number] Date of Birth: 1986-02-08 Referring Provider: Barbaraann Boys  Encounter Date: 06/25/2016      End of Session - 06/25/16 1622    Visit Number 21   Number of Visits 37   Date for SLP Re-Evaluation 08/22/15   Authorization Type 7 of 30 SLP sessions for 2018   SLP Start Time 1500   SLP Stop Time  1600   SLP Time Calculation (min) 60 min   Activity Tolerance Patient tolerated treatment well      Past Medical History:  Diagnosis Date  . Polysubstance abuse   . Restless leg syndrome unk    Past Surgical History:  Procedure Laterality Date  . CHOLECYSTECTOMY    . TUBAL LIGATION      There were no vitals filed for this visit.      Subjective Assessment - 06/25/16 1621    Subjective Patient reports that she will be moving to a local assissted living situation   Currently in Pain? No/denies               ADULT SLP TREATMENT - 06/25/16 0001      General Information   Behavior/Cognition Alert;Cooperative;Pleasant mood   HPI TBI     Treatment Provided   Treatment provided Cognitive-Linquistic     Pain Assessment   Pain Assessment No/denies pain     Cognitive-Linquistic Treatment   Treatment focused on Cognition;Patient/family/caregiver education   Skilled Treatment INDEPENDENCE/SELF-RESPONSIBILITY: The patient will be moving to an assisted living home in the next few weeks.  COMPREHENSION/ABSTRACT LANAGUAGE: Completed a Perplexor Level A puzzle as homework, given one cue.  During our session, completed another with one error secondary not being thorough in marking her grid.  Otherwise she did  use tips given previously and interpret / reason information provided.  Generate sentences to explain why a solution is not good  with 70% accuracy independently and accurately given cues to be thorough.  Brainstorm 3 solutions for hypothetical problem with 70% accuracy.     Assessment / Recommendations / Plan   Plan Continue with current plan of care     Progression Toward Goals   Progression toward goals Progressing toward goals          SLP Education - 06/25/16 1621    Education provided Yes   Education Details Independence includes taking responsibility for completing tasks assigned    Person(s) Educated Patient   Methods Explanation   Comprehension Verbalized understanding            SLP Long Term Goals - 06/23/16 1542      SLP LONG TERM GOAL #1   Title Patient will demonstrate functional cognitive-communication skills for independent completion of personal responsibilities.   Time 8   Period Weeks   Status Partially Met     SLP LONG TERM GOAL #2   Title Patient will independently complete complex executive function skills tasks with 80% accuracy.   Time 8   Period Weeks   Status Partially Met     SLP LONG TERM GOAL #3   Title Patient will complete complex attention tasks with 80% accuracy.   Time 8   Period Weeks   Status Partially Met     SLP LONG TERM GOAL #4   Title Patient will complete  memory strategy activities with 80% accuracy.   Time 8   Period Weeks   Status Partially Met     SLP LONG TERM GOAL #5   Title Patient will generate grammatical and cogent sentences to complete abstract/complex linguistic tasks with 80% accuracy   Time 8   Period Weeks   Status Partially Met          Plan - 06/25/16 1622    Clinical Impression Statement The patient demonstrated good working and short-term memory for [employer] of familiar tasks.  She demonstrated difficulty with determining exactly what question was being asked secondary impulsive responses without thinking through the question.   In general, the patient demonstrates improved scanning for information in a complex  setting and improved perseverance.  The patient is demonstrating behaviors that interfere with reading comprehension and new learning.  Behaviors include: impulsive responses, mis-reading words, reduced flexibility, reduced access to common knowledge, and faulty memory for information.  The patient is taking feedback regarding her responses and attempts to modify her response.   Speech Therapy Frequency 2x / week   Duration Other (comment)   Treatment/Interventions Compensatory techniques;Cognitive reorganization;Internal/external aids;Functional tasks;SLP instruction and feedback;Compensatory strategies;Patient/family education   Potential to Achieve Goals Good   Potential Considerations Ability to learn/carryover information;Cooperation/participation level;Previous level of function;Severity of impairments;Family/community support   SLP Home Exercise Plan perplexor level A   Consulted and Agree with Plan of Care Patient      Patient will benefit from skilled therapeutic intervention in order to improve the following deficits and impairments:   Cognitive communication deficit    Problem List There are no active problems to display for this patient.  Leroy Sea, MS/CCC- SLP  Lou Miner 06/25/2016, 4:23 PM  [place] MAIN [facility] SERVICES [address] [place], [place], [address] Phone: [phone]   Fax:  [phone]   Name: KARLINA SUARES MRN: [id number] Date of Birth: [birth year]
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
## 2016-06-25 NOTE — Therapy (Addendum)
[place] [facility] MAIN [facility] SERVICES [address] [place], [place], [address] Phone: [phone]   Fax:  [phone]  Physical Therapy Treatment  Patient Details  Name: Natasha [name] MRN: [id number] Date of Birth: 1985/12/02 Referring Provider: Orene Desanctis  Encounter Date: 06/25/2016      PT End of Session - 06/25/16 1313    Visit Number 34   Number of Visits 49   Date for PT Re-Evaluation 07/16/16   Authorization Type 33 visits beginning 05/01/16    PT Start Time 0103   PT Stop Time 0145   PT Time Calculation (min) 42 min   Equipment Utilized During Treatment Gait belt   Activity Tolerance Patient tolerated treatment well;Patient limited by fatigue   Behavior During Therapy [facility] for tasks assessed/performed      Past Medical History:  Diagnosis Date  . Polysubstance abuse   . Restless leg syndrome unk    Past Surgical History:  Procedure Laterality Date  . CHOLECYSTECTOMY    . TUBAL LIGATION      There were no vitals filed for this visit.      Subjective Assessment - 06/25/16 1309    Subjective Patient is doing well today and denies pain. She states she is doing her exercises and stretching at home. She says that she might be going to a group home at [facility]. She reports that she is able to ambulate without her assistive device indoors and needs them for steps and outdoors, but can sometimes use only 1 loftstrand crutch outside.    Pertinent History TBI   Patient Stated Goals to be able to walk without the loftstrand crutches   Currently in Pain? No/denies   Pain Score 0-No pain   Pain Onset Today      Therapeutic exercise and neuromuscular training: 1/2 foam flat side up and balance with head turns left and right feet apart and feet together CGA  tandem standing on 1/2 foam  CGA standing hip abd with YTB x 20   side stepping left and right in parallel bars 10 feet x 3 CGA step ups from floor to 6 inch stool x 20 bilateral,  tapping from foam to 6 inch stool. CGA marching in parallel bars x 20 CGA Tilt board fwd/bwd, side to side left and right CGA occtane cross trainer x 10 minutes UE and LE level 10  Leg press x 100 lbs x 20 x 3  Patient continues to demonstrates less incoordination of movement with select exercises  and needs CGA with balance activities. She has loss of balance with step ups and toe tapping . She has fatigue and needs several short standing rest periods during treatment.                           PT Education - 06/25/16 1310    Education provided Yes   Education Details safety with mobility   Person(s) Educated Patient   Methods Explanation   Comprehension Verbalized understanding             PT Long Term Goals - 06/26/16 1042      PT LONG TERM GOAL #1   Title Patient will reduce timed up and go to <11 seconds to reduce fall risk and demonstrate improved transfer/gait ability.   Time 12   Period Weeks   Status On-going     PT LONG TERM GOAL #2   Title Patient will increase six  minute walk test distance to >1000 for progression to community ambulator and improve gait ability   Time 12   Period Weeks   Status On-going     PT LONG TERM GOAL #3   Title Patient will increase 10 meter walk test to >1.2961m/s as to improve gait speed for better community ambulation and to reduce fall    Time 12   Status On-going     PT LONG TERM GOAL #4   Title Patient will tolerate 5 seconds of single leg stance without loss of balance to improve ability to get in and out of shower safely   Time 12   Period Weeks   Status Achieved     PT LONG TERM GOAL #5   Title Patient (< 31 years old) will complete five times sit to stand test in < 10 seconds indicating an increased LE strength and improved balance   Time 12   Period Weeks   Status On-going               Plan - 06/25/16 1314    Clinical Impression Statement Continuous verbal cues and tactile cues needed to  correct form with exercises and for posture correction. Patient is able to perform dynamic standing exercises and strengthening exercises  without reports of pain. Patient does need CGA during dynamic standing balance training .Focused on improving static/dynamic balance while improving LE strength and patient demonstrated increased postural sway and required use of UE support to perform exercises indicating decreased balance. Patient will benefit from further skilled therapy to return to prior level of function   Rehab Potential Good   PT Frequency 2x / week   PT Duration 12 weeks   PT Treatment/Interventions Therapeutic activities;Therapeutic exercise;Balance training;Neuromuscular re-education;Stair training;Gait training;Patient/family education   PT Next Visit Plan strengthening and balance training; gait training   PT Home Exercise Plan As prescribed   Consulted and Agree with Plan of Care Patient      Patient will benefit from skilled therapeutic intervention in order to improve the following deficits and impairments:  Abnormal gait, Decreased balance, Decreased endurance, Difficulty walking, Decreased cognition, Decreased safety awareness, Decreased strength, Impaired UE functional use, Obesity  Visit Diagnosis: Muscle weakness (generalized)  Other lack of coordination  Difficulty in walking, not elsewhere classified     Problem List There are no active problems to display for this patient.  Ezekiel InaKristine S Dannon Perlow, PT, DPT [place], Barkley BrunsKristine S 06/26/2016, 10:43 AM  [place] [facility] [address] [place], [place], [address] Phone: [phone]   Fax:  [phone]  Name: Hector ShadeWhitney B Luse MRN: [id number] Date of Birth: 06/04/1985

## 2016-07-01 ENCOUNTER — Ambulatory Visit: Payer: Commercial Managed Care - HMO | Admitting: Speech Pathology

## 2016-07-03 ENCOUNTER — Ambulatory Visit: Payer: Commercial Managed Care - HMO | Attending: Pediatrics | Admitting: Speech Pathology

## 2016-07-03 ENCOUNTER — Ambulatory Visit: Payer: Commercial Managed Care - HMO | Admitting: Physical Therapy

## 2016-07-03 ENCOUNTER — Ambulatory Visit: Payer: Commercial Managed Care - HMO | Admitting: Occupational Therapy

## 2016-07-03 ENCOUNTER — Encounter: Payer: Self-pay | Admitting: Physical Therapy

## 2016-07-03 ENCOUNTER — Encounter: Payer: Self-pay | Admitting: Speech Pathology

## 2016-07-03 VITALS — BP 134/89 | HR 81

## 2016-07-03 DIAGNOSIS — R29810 Facial weakness: Secondary | ICD-10-CM | POA: Diagnosis not present

## 2016-07-03 DIAGNOSIS — M6281 Muscle weakness (generalized): Secondary | ICD-10-CM

## 2016-07-03 DIAGNOSIS — R278 Other lack of coordination: Secondary | ICD-10-CM

## 2016-07-03 DIAGNOSIS — R262 Difficulty in walking, not elsewhere classified: Secondary | ICD-10-CM

## 2016-07-03 DIAGNOSIS — R41841 Cognitive communication deficit: Secondary | ICD-10-CM | POA: Insufficient documentation
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
## 2016-07-03 NOTE — Patient Instructions (Signed)
OT TREATMENT     Neuro muscular re-education:    Therapeutic Exercise:  Selfcare:  Pt. worked on typing speed and accuracy using a typing program. Pt. typed a speed of 15 wpm. Pt. Worked on a typing test word drills, and sentence drills. Pt. Required verbal cues, and assist to begin, and occasionally for direction during the task.  Manual Therapy:

## 2016-07-03 NOTE — Therapy (Signed)
[place] MAIN [facility] SERVICES [address] [place], [place], [address] Phone: [phone]   Fax:  [phone]  Speech Language Pathology Treatment  Patient Details  Name: SHANTIKA BERMEA MRN: [id number] Date of Birth: 11/06/1985 Referring Provider: Barbaraann Boys  Encounter Date: 07/03/2016      End of Session - 07/03/16 1439    Visit Number 22   Number of Visits 37   Date for SLP Re-Evaluation 08/22/15   Authorization Type 8 of 30 SLP sessions for 2018   SLP Start Time 1300   SLP Stop Time  1400   SLP Time Calculation (min) 60 min   Activity Tolerance Patient tolerated treatment well      Past Medical History:  Diagnosis Date  . Polysubstance abuse   . Restless leg syndrome unk    Past Surgical History:  Procedure Laterality Date  . CHOLECYSTECTOMY    . TUBAL LIGATION      There were no vitals filed for this visit.      Subjective Assessment - 07/03/16 1438    Subjective Patient reports that she will be moving to a local assissted living situation   Currently in Pain? No/denies               ADULT SLP TREATMENT - 07/03/16 0001      General Information   Behavior/Cognition Alert;Cooperative;Pleasant mood   HPI TBI     Treatment Provided   Treatment provided Cognitive-Linquistic     Pain Assessment   Pain Assessment No/denies pain     Cognitive-Linquistic Treatment   Treatment focused on Cognition;Patient/family/caregiver education   Skilled Treatment COMPREHENSION/ABSTRACT LANAGUAGE: Did not complete a Perplexor Level A puzzle as homework, stated she was distracted by tooth pain.  During our session, completed another with one error secondary not being thorough in marking her grid.  Otherwise she did use tips given previously and interpret / reason information provided.  Determine main idea / detail / not accurate concerning statements about a paragraph with 90% accuracy.  Complete word math problems with  70% accuracy independently.  Errors include reduced fund of knowledge (measurements, percentages, long division, twice).     Assessment / Recommendations / Plan   Plan Continue with current plan of care     Progression Toward Goals   Progression toward goals Progressing toward goals          SLP Education - 07/03/16 1438    Education provided Yes   Education Details Independence includes taking responsibility for completing tasks assigned   Person(s) Educated Patient   Methods Explanation   Comprehension Verbalized understanding            SLP Long Term Goals - 06/23/16 1542      SLP LONG TERM GOAL #1   Title Patient will demonstrate functional cognitive-communication skills for independent completion of personal responsibilities.   Time 8   Period Weeks   Status Partially Met     SLP LONG TERM GOAL #2   Title Patient will independently complete complex executive function skills tasks with 80% accuracy.   Time 8   Period Weeks   Status Partially Met     SLP LONG TERM GOAL #3   Title Patient will complete complex attention tasks with 80% accuracy.   Time 8   Period Weeks   Status Partially Met     SLP LONG TERM GOAL #4   Title Patient will complete memory strategy activities with 80% accuracy.  Time 8   Period Weeks   Status Partially Met     SLP LONG TERM GOAL #5   Title Patient will generate grammatical and cogent sentences to complete abstract/complex linguistic tasks with 80% accuracy   Time 8   Period Weeks   Status Partially Met          Plan - 07/03/16 1440    Clinical Impression Statement The patient demonstrated good working and short-term memory for [employer] of familiar tasks.  She demonstrated difficulty with determining exactly what question was being asked secondary impulsive responses without thinking through the question.   In general, the patient demonstrates improved scanning for information in a complex setting and improved  perseverance.  The patient is demonstrating behaviors that interfere with reading comprehension and new learning.  Behaviors include: impulsive responses, mis-reading words, reduced flexibility, reduced access to common knowledge, and faulty memory for information.  The patient is taking feedback regarding her responses and attempts to modify her response.   Speech Therapy Frequency 2x / week   Duration Other (comment)   Treatment/Interventions Compensatory techniques;Cognitive reorganization;Internal/external aids;Functional tasks;SLP instruction and feedback;Compensatory strategies;Patient/family education   Potential to Achieve Goals Good   Potential Considerations Ability to learn/carryover information;Cooperation/participation level;Previous level of function;Severity of impairments;Family/community support   SLP Home Exercise Plan common knowledge work sheet   Consulted and Agree with Plan of Care Patient      Patient will benefit from skilled therapeutic intervention in order to improve the following deficits and impairments:   Cognitive communication deficit    Problem List There are no active problems to display for this patient.  Leroy Sea, MS/CCC- SLP  Lou Miner 07/03/2016, 2:42 PM  [place] MAIN [facility] SERVICES [address] [place], [place], [address] Phone: [phone]   Fax:  [phone]   Name: SHACORA ZYNDA MRN: [id number] Date of Birth: [birth year]

## 2016-07-03 NOTE — Therapy (Signed)
[place] [facility] [address] [place], [place], [address] Phone: [phone]   Fax:  [phone]  Physical Therapy Treatment  Patient Details  Name: Natasha [name] MRN: [id number] Date of Birth: 10/01/1985 Referring Provider: Orene DesanctisBEHLING, KAREN  Encounter Date: 07/03/2016      PT End of Session - 07/03/16 1517    Visit Number 35   Number of Visits 49   Date for PT Re-Evaluation 07/16/16   Authorization Type 33 visits beginning 05/01/16    PT Start Time 1516   PT Stop Time 1600   PT Time Calculation (min) 44 min   Equipment Utilized During Treatment Gait belt   Activity Tolerance Patient tolerated treatment well;Patient limited by fatigue   Behavior During Therapy [facility] for tasks assessed/performed      Past Medical History:  Diagnosis Date  . Polysubstance abuse   . Restless leg syndrome unk    Past Surgical History:  Procedure Laterality Date  . CHOLECYSTECTOMY    . TUBAL LIGATION      Vitals:   07/03/16 1520  BP: 134/89  Pulse: 81  SpO2: 99%        Subjective Assessment - 07/03/16 1520    Subjective Pt reports she had a tooth pulled two days ago and is having a headache as a result.  Nothing new to report today regarding PT.     Pertinent History TBI   Patient Stated Goals to be able to walk without the loftstrand crutches   Currently in Pain? Yes   Pain Score 5    Pain Location --  headache   Pain Orientation --  all over head   Pain Descriptors / Indicators Headache   Pain Type Surgical pain   Pain Onset In the past 7 days        TREATMENT   Therapeutic Exercise:  Bil leg press 135# 1x10, 150# 2x10 with cue for eccentric control  Sit<>stand 2x10 from chair while lifting yellow medicine ball (2.2#) overhead when powering up to stand.  Forward lunges on Bosu ball 2x10 each LE   Neuromuscular Re-ed:  Tandem stance on airex with horizontal and vertical head turns 2x30 seconds with L foot forward  and 2x30 seconds with R foot forward  Rhomberg stance on airex while performing D2 with 2# weight 2x10 with each UE  Tandem walking on airex x8 lengths in // bars with up to minA to steady  Balancing on ant/post rockerboard x1 minute with up to minA to steady  Balancing on L/R rockerboard x1 minute with up to minA to steady  Rocking forward and back on  foam roll with up to minA to steady x1 minute               PT Education - 07/03/16 1516    Education provided Yes   Education Details Exercise technique   Person(s) Educated Patient   Methods Explanation;Demonstration;Verbal cues   Comprehension Verbalized understanding;Returned demonstration;Need further instruction             PT Long Term Goals - 06/26/16 1042      PT LONG TERM GOAL #1   Title Patient will reduce timed up and go to <11 seconds to reduce fall risk and demonstrate improved transfer/gait ability.   Time 12   Period Weeks   Status On-going     PT LONG TERM GOAL #2   Title Patient will increase six minute walk test distance to >1000 for progression  to community ambulator and improve gait ability   Time 12   Period Weeks   Status On-going     PT LONG TERM GOAL #3   Title Patient will increase 10 meter walk test to >1.76m/s as to improve gait speed for better community ambulation and to reduce fall    Time 12   Status On-going     PT LONG TERM GOAL #4   Title Patient will tolerate 5 seconds of single leg stance without loss of balance to improve ability to get in and out of shower safely   Time 12   Period Weeks   Status Achieved     PT LONG TERM GOAL #5   Title Patient (< 75 years old) will complete five times sit to stand test in < 10 seconds indicating an increased LE strength and improved balance   Time 12   Period Weeks   Status On-going               Plan - 07/03/16 1540    Clinical Impression Statement Pt required cues for proper technique when performing leg press for  eccentric control and demonstrated fatigue at the end of each set of strengthening exercises.  She continues to present with balance impairments, specifically with LOB posteriorly when challenged, and tolerated all balance activities well today.  Pt will benefit from continued skilled PT interventions for improved balance, strength and to decrease risk of falling.   Rehab Potential Good   PT Frequency 2x / week   PT Duration 12 weeks   PT Treatment/Interventions Therapeutic activities;Therapeutic exercise;Balance training;Neuromuscular re-education;Stair training;Gait training;Patient/family education   PT Next Visit Plan strengthening and balance training; gait training   PT Home Exercise Plan As prescribed   Consulted and Agree with Plan of Care Patient      Patient will benefit from skilled therapeutic intervention in order to improve the following deficits and impairments:  Abnormal gait, Decreased balance, Decreased endurance, Difficulty walking, Decreased cognition, Decreased safety awareness, Decreased strength, Impaired UE functional use, Obesity  Visit Diagnosis: Muscle weakness (generalized)  Other lack of coordination  Difficulty in walking, not elsewhere classified     Problem List There are no active problems to display for this patient.   Encarnacion Chu PT, DPT 07/03/2016, 4:04 PM  [place] [facility] MAIN [facility] SERVICES [address] [place], [place], [address] Phone: [phone]   Fax:  [phone]  Name: Natasha [name] MRN: [id number] Date of Birth: 03/10/1986

## 2016-07-07 ENCOUNTER — Ambulatory Visit: Payer: Commercial Managed Care - HMO | Admitting: Speech Pathology

## 2016-07-07 ENCOUNTER — Ambulatory Visit: Payer: Commercial Managed Care - HMO | Admitting: Physical Therapy

## 2016-07-07 ENCOUNTER — Ambulatory Visit: Payer: Commercial Managed Care - HMO | Admitting: Occupational Therapy

## 2016-07-10 ENCOUNTER — Ambulatory Visit: Payer: Commercial Managed Care - HMO | Admitting: Speech Pathology

## 2016-07-10 DIAGNOSIS — R41841 Cognitive communication deficit: Secondary | ICD-10-CM

## 2016-07-11 ENCOUNTER — Encounter: Payer: Self-pay | Admitting: Speech Pathology

## 2016-07-11 NOTE — Therapy (Signed)
[place] MAIN [facility] SERVICES [address] [place], [place], [address] Phone: [phone]   Fax:  [phone]  Speech Language Pathology Treatment  Patient Details  Name: SOWMYA PARTRIDGE MRN: [id number] Date of Birth: [birth year] Referring Provider: Barbaraann Boys  Encounter Date: 07/10/2016      End of Session - 07/11/16 1054    Visit Number 23   Number of Visits 37   Date for SLP Re-Evaluation 08/22/15   Authorization Type 9 of 30 SLP sessions for 2018   SLP Start Time 1300   SLP Stop Time  1345   SLP Time Calculation (min) 45 min   Activity Tolerance Patient tolerated treatment well      Past Medical History:  Diagnosis Date  . Polysubstance abuse   . Restless leg syndrome unk    Past Surgical History:  Procedure Laterality Date  . CHOLECYSTECTOMY    . TUBAL LIGATION      There were no vitals filed for this visit.      Subjective Assessment - 07/11/16 1053    Subjective Patient reports that she will NOT be moving to the local assissted living situation   Currently in Pain? No/denies               ADULT SLP TREATMENT - 07/11/16 0001      General Information   Behavior/Cognition Alert;Cooperative;Pleasant mood   HPI TBI     Treatment Provided   Treatment provided Cognitive-Linquistic     Pain Assessment   Pain Assessment No/denies pain     Cognitive-Linquistic Treatment   Treatment focused on Cognition;Patient/family/caregiver education   Skilled Treatment INDEPENDENCE/SELF-RESPONSIBILITY: The patient will not be moving to the assisted living home as she does not require the level of care provided at the facility.  The patient would like to live independently.  The patient returned with homework given last time.  Patient given menu/grocery list task to complete at home.  COMPREHENSION/ABSTRACT LANAGUAGE: completed lengthy calculator math worksheet with 70% accuracy (once reminded how to calculate %).   Errors due to attention- skip 3 problems, place result at wrong problem, careless data entry.  Completed 2 Perplexors with min SLP cues.  Complete main idea RE: correspondence with 80% accuracy.     Assessment / Recommendations / Plan   Plan Continue with current plan of care     Progression Toward Goals   Progression toward goals Progressing toward goals          SLP Education - 07/11/16 1053    Education provided Yes   Education Details skills rrequired for independent living   Person(s) Educated Patient   Methods Explanation   Comprehension Verbalized understanding            SLP Long Term Goals - 06/23/16 1542      SLP LONG TERM GOAL #1   Title Patient will demonstrate functional cognitive-communication skills for independent completion of personal responsibilities.   Time 8   Period Weeks   Status Partially Met     SLP LONG TERM GOAL #2   Title Patient will independently complete complex executive function skills tasks with 80% accuracy.   Time 8   Period Weeks   Status Partially Met     SLP LONG TERM GOAL #3   Title Patient will complete complex attention tasks with 80% accuracy.   Time 8   Period Weeks   Status Partially Met     SLP LONG TERM GOAL #4  Title Patient will complete memory strategy activities with 80% accuracy.   Time 8   Period Weeks   Status Partially Met     SLP LONG TERM GOAL #5   Title Patient will generate grammatical and cogent sentences to complete abstract/complex linguistic tasks with 80% accuracy   Time 8   Period Weeks   Status Partially Met          Plan - 07/11/16 1054    Clinical Impression Statement The patient demonstrated good working and short-term memory for [employer] tasks of working the [employer].  She was observed to utilitze tips given previously and thought through clues with less direct cues from SLP.   Speech Therapy Frequency 2x / week   Duration Other (comment)    Treatment/Interventions Compensatory techniques;Cognitive reorganization;Internal/external aids;Functional tasks;SLP instruction and feedback;Compensatory strategies;Patient/family education   Potential to Achieve Goals Good   Potential Considerations Ability to learn/carryover information;Cooperation/participation level;Previous level of function;Severity of impairments;Family/community support   SLP Home Exercise Plan Menu and grocery list task   Consulted and Agree with Plan of Care Patient      Patient will benefit from skilled therapeutic intervention in order to improve the following deficits and impairments:   Cognitive communication deficit    Problem List There are no active problems to display for this patient.  Leroy Sea, [place], Susie 07/11/2016, 10:56 AM  [place] MAIN [facility] SERVICES [address] [place], [place], [address] Phone: [phone]   Fax:  [phone]   Name: DIANNAH RINDFLEISCH MRN: [id number] Date of Birth: 28-Nov-1985

## 2016-07-15 ENCOUNTER — Ambulatory Visit: Payer: Commercial Managed Care - HMO | Admitting: Occupational Therapy

## 2016-07-15 ENCOUNTER — Ambulatory Visit: Payer: Commercial Managed Care - HMO | Admitting: Speech Pathology

## 2016-07-15 ENCOUNTER — Ambulatory Visit: Payer: Commercial Managed Care - HMO | Admitting: Physical Therapy

## 2016-07-15 ENCOUNTER — Encounter: Payer: Self-pay | Admitting: Physical Therapy

## 2016-07-15 DIAGNOSIS — M6281 Muscle weakness (generalized): Secondary | ICD-10-CM

## 2016-07-15 DIAGNOSIS — R41841 Cognitive communication deficit: Secondary | ICD-10-CM | POA: Diagnosis not present

## 2016-07-15 DIAGNOSIS — R262 Difficulty in walking, not elsewhere classified: Secondary | ICD-10-CM

## 2016-07-15 DIAGNOSIS — R278 Other lack of coordination: Secondary | ICD-10-CM

## 2016-07-15 NOTE — Therapy (Signed)
[place] [facility] MAIN [facility] SERVICES [address] [place], [place], [address] Phone: [phone]   Fax:  [phone]  Physical Therapy Treatment  Patient Details  Name: Natasha [name] MRN: [id number] Date of Birth: 1986-03-11 Referring Provider: Orene Desanctis  Encounter Date: 07/15/2016      PT End of Session - 07/15/16 1444    Visit Number 36   Number of Visits 49   Date for PT Re-Evaluation 09/10/16   Authorization Type 33 visits beginning 05/01/16    PT Start Time 0230   PT Stop Time 0315   PT Time Calculation (min) 45 min   Equipment Utilized During Treatment Gait belt   Activity Tolerance Patient tolerated treatment well;Patient limited by fatigue   Behavior During Therapy [facility] for tasks assessed/performed      Past Medical History:  Diagnosis Date  . Polysubstance abuse   . Restless leg syndrome unk    Past Surgical History:  Procedure Laterality Date  . CHOLECYSTECTOMY    . TUBAL LIGATION      There were no vitals filed for this visit.      Subjective Assessment - 07/15/16 1440    Subjective Pt reports she is feeling a bit better.   Pertinent History TBI   Patient Stated Goals to be able to walk without the loftstrand crutches   Currently in Pain? No/denies   Pain Score 0-No pain   Pain Onset In the past 7 days      OUTCOME MEASURES: TEST Outcome Interpretation  5 times sit<>stand 11.69sec >60 yo, >15 sec indicates increased risk for falls  10 meter walk test   . 65               m/s <1.0 m/s indicates increased risk for falls; limited community ambulator  Timed up and Go   11.05              sec <14 sec indicates increased risk for falls  6 minute walk test    1000            Feet 1000 feet is community ambulator             Therapeutic exercise and neuromuscular training: 1/2 foam flat side up and balance with head turns left and right feet apart and feet together x 2 mins  tandem standing on 1/2 foam  x 2  mins standing hip abd with YTB x 20   side stepping left and right in parallel bars 10 feet x 3 step ups from floor to 6 inch stool x 20 bilateral marching in parallel bars x 20 Tilt board fwd/bwd, side to side left and right x 3 mins Blue foam side stepping left and right x 10 x 2 Patient needs occasional verbal cueing to improve posture and cueing to correctly perform exercises slowly, holding at end of range to increase motor firing of desired muscle to encourage fatigue.                          PT Education - 07/15/16 1441    Education provided Yes   Education Details saftey   Person(s) Educated Patient   Methods Explanation   Comprehension Verbalized understanding             PT Long Term Goals - 07/15/16 1459      PT LONG TERM GOAL #1   Title Patient will reduce timed up and go  to <11 seconds to reduce fall risk and demonstrate improved transfer/gait ability.   Baseline 11.05   Time 12   Period Weeks   Status Achieved     PT LONG TERM GOAL #2   Title Patient will increase six minute walk test distance to >1000 for progression to community ambulator and improve gait ability   Baseline 1000 feet   Time 12   Period Weeks   Status Achieved     PT LONG TERM GOAL #3   Title Patient will increase 10 meter walk test to >1.7467m/s as to improve gait speed for better community ambulation and to reduce fall    Baseline .91 m/sec   Time 12   Period Weeks   Status On-going     PT LONG TERM GOAL #4   Title Patient will tolerate 5 seconds of single leg stance without loss of balance to improve ability to get in and out of shower safely   Baseline 2 seconds   Time 12   Period Weeks   Status On-going     PT LONG TERM GOAL #5   Title Patient (< 31 years old) will complete five times sit to stand test in < 10 seconds indicating an increased LE strength and improved balance   Baseline see note   Time 12   Period Weeks   Status On-going     Additional  Long Term Goals   Additional Long Term Goals Yes             Patient will benefit from skilled therapeutic intervention in order to improve the following deficits and impairments:     Visit Diagnosis: Muscle weakness (generalized)  Other lack of coordination  Difficulty in walking, not elsewhere classified     Problem List There are no active problems to display for this patient.  Ezekiel InaKristine S [place]field, PT, DPT [place], Barkley BrunsKristine S 07/15/2016, 3:09 PM  [place] [facility] MAIN [facility] [address] [place], [place], [address] Phone: [phone]   Fax:  [phone]  Name: Natasha [name] MRN: [id number] Date of Birth: 12/07/1985

## 2016-07-15 NOTE — Patient Instructions (Signed)
OT TREATMENT     Neuro muscular re-education:  Therapeutic Exercise:  Selfcare:  Pt. worked on writing, and filling out calendars while challenging to write in small spaces. Pt. required cues for the accuracy of filling in dates. Pt. required verbal cues for writing smaller.   Manual Therapy:
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
## 2016-07-16 ENCOUNTER — Encounter: Payer: Self-pay | Admitting: Speech Pathology

## 2016-07-16 NOTE — Therapy (Signed)
[place] MAIN [facility] SERVICES [address] [place], [place], [address] Phone: [phone]   Fax:  [phone]  Speech Language Pathology Treatment  Patient Details  Name: MONCERRATH BERHE MRN: [id number] Date of Birth: [birth year] Referring Provider: Barbaraann Boys  Encounter Date: 07/15/2016      End of Session - 07/16/16 1614    Visit Number 24   Number of Visits 37   Date for SLP Re-Evaluation 08/22/15   Authorization Type 10 of 30 SLP sessions for 2018   SLP Start Time 1600   SLP Stop Time  1700   SLP Time Calculation (min) 60 min   Activity Tolerance Patient tolerated treatment well      Past Medical History:  Diagnosis Date  . Polysubstance abuse   . Restless leg syndrome unk    Past Surgical History:  Procedure Laterality Date  . CHOLECYSTECTOMY    . TUBAL LIGATION      There were no vitals filed for this visit.      Subjective Assessment - 07/16/16 1613    Subjective Patient reports that she will NOT be moving to the local assissted living situation   Currently in Pain? No/denies               ADULT SLP TREATMENT - 07/16/16 0001      General Information   Behavior/Cognition Alert;Cooperative;Pleasant mood   HPI TBI     Treatment Provided   Treatment provided Cognitive-Linquistic     Pain Assessment   Pain Assessment No/denies pain     Cognitive-Linquistic Treatment   Treatment focused on Cognition;Patient/family/caregiver education   Skilled Treatment INDEPENDENCE/SELF-RESPONSIBILITY: The patient will not be moving to the assisted living home as she does not require the level of care provided at the facility.  The patient would like to live independently.  Patient given menu/grocery list task to complete at home.  The patient did not bring her homework, she was not aware that she was to have speech therapy as well as OT/PT.  The patient participated in tasks for the Functional Assessment of Verbal  Reasoning and Executive Strategies.  In the planning an event task, the patient demonstrated decreased memory for details that were required to complete the task, she requires cues to state significant factors to be considered while completing the task.  In a making a decision task, the patient required cues to to attend to details of information presented and to follow directions completely.  COMPREHENSION/ABSTRACT LANAGUAGE: Completed 2 Perplexors with min SLP cues.       Assessment / Recommendations / Plan   Plan Continue with current plan of care     Progression Toward Goals   Progression toward goals Progressing toward goals          SLP Education - 07/16/16 1613    Education provided Yes   Education Details Executive skills she needs to demonstrate to validate independent living   Person(s) Educated Patient   Methods Explanation   Comprehension Verbalized understanding            SLP Long Term Goals - 06/23/16 1542      SLP LONG TERM GOAL #1   Title Patient will demonstrate functional cognitive-communication skills for independent completion of personal responsibilities.   Time 8   Period Weeks   Status Partially Met     SLP LONG TERM GOAL #2   Title Patient will independently complete complex executive function skills tasks with 80% accuracy.  Time 8   Period Weeks   Status Partially Met     SLP LONG TERM GOAL #3   Title Patient will complete complex attention tasks with 80% accuracy.   Time 8   Period Weeks   Status Partially Met     SLP LONG TERM GOAL #4   Title Patient will complete memory strategy activities with 80% accuracy.   Time 8   Period Weeks   Status Partially Met     SLP LONG TERM GOAL #5   Title Patient will generate grammatical and cogent sentences to complete abstract/complex linguistic tasks with 80% accuracy   Time 8   Period Weeks   Status Partially Met          Plan - 07/16/16 1615    Clinical Impression Statement The patient  demonstrated good working and short-term memory for [employer] of familiar tasks.  She demonstrated difficulty with determining exactly what question was being asked secondary impulsive responses without thinking through the question.   In general, the patient demonstrates improved scanning for information in a complex setting and improved perseverance.  The patient is demonstrating behaviors that interfere with reading comprehension and new learning.  Behaviors include: impulsive responses, mis-reading words, reduced flexibility, reduced access to common knowledge, and faulty memory for information.  The patient is taking feedback regarding her responses and has improved in her ability to modify her response.  Will continue to assess/address executive skills necessary for independent living.   Speech Therapy Frequency 2x / week   Duration Other (comment)   Treatment/Interventions Compensatory techniques;Cognitive reorganization;Internal/external aids;Functional tasks;SLP instruction and feedback;Compensatory strategies;Patient/family education   Potential to Achieve Goals Good   Potential Considerations Ability to learn/carryover information;Cooperation/participation level;Previous level of function;Severity of impairments;Family/community support   SLP Home Exercise Plan Menu and grocery list task, logic problems   Consulted and Agree with Plan of Care Patient      Patient will benefit from skilled therapeutic intervention in order to improve the following deficits and impairments:   Cognitive communication deficit    Problem List There are no active problems to display for this patient.  Leroy Sea, MS/CCC- SLP  Lou Miner 07/16/2016, 4:16 PM  [place] MAIN [facility] SERVICES [address] [place], [place], [address] Phone: [phone]   Fax:  [phone]   Name: PECOLIA MARANDO MRN: [id number] Date of Birth: April 19, 1986

## 2016-07-18 ENCOUNTER — Encounter: Payer: Self-pay | Admitting: Speech Pathology

## 2016-07-18 ENCOUNTER — Ambulatory Visit: Payer: Commercial Managed Care - HMO | Admitting: Speech Pathology

## 2016-07-18 DIAGNOSIS — R41841 Cognitive communication deficit: Secondary | ICD-10-CM

## 2016-07-18 NOTE — Therapy (Signed)
[place] MAIN [facility] SERVICES [address] [place], [place], [address] Phone: [phone]   Fax:  [phone]  Speech Language Pathology Treatment  Patient Details  Name: Natasha [name] MRN: [id number] Date of Birth: [birth year] Referring Provider: Barbaraann Boys  Encounter Date: 07/18/2016      End of Session - 07/18/16 1057    Visit Number 25   Number of Visits 37   Date for SLP Re-Evaluation 08/22/15   Authorization Type 11 of 30 SLP sessions for 2018   SLP Start Time 0950   SLP Stop Time  1045   SLP Time Calculation (min) 55 min   Activity Tolerance Patient tolerated treatment well      Past Medical History:  Diagnosis Date  . Polysubstance abuse   . Restless leg syndrome unk    Past Surgical History:  Procedure Laterality Date  . CHOLECYSTECTOMY    . TUBAL LIGATION      There were no vitals filed for this visit.      Subjective Assessment - 07/18/16 1056    Subjective Patient reports that she will NOT be moving to the local assissted living situation   Currently in Pain? No/denies               ADULT SLP TREATMENT - 07/18/16 0001      General Information   Behavior/Cognition Alert;Cooperative;Pleasant mood   HPI TBI     Treatment Provided   Treatment provided Cognitive-Linquistic     Pain Assessment   Pain Assessment No/denies pain     Cognitive-Linquistic Treatment   Treatment focused on Cognition;Patient/family/caregiver education   Skilled Treatment INDEPENDENCE/SELF-RESPONSIBILITY: The patient will not be moving to the assisted living home as she does not require the level of care provided at the facility.  The patient would like to live independently.  Patient given menu/grocery list task to complete at home.  The patient states that she is working on [employer], and brought several past assignments.  The patient participated in tasks for the Functional Assessment of Verbal Reasoning and  Executive Strategies.  In the scheduling task, the patient demonstrated decreased memory for details that were required to complete the task, she requires cues to state significant factors to be considered while completing the task, she had difficulty selecting abbreviated task name, failed to schedule items beyond the  hour block when the task was to be several hours.  The patient is able to write a note to make a request.     Assessment / Recommendations / Plan   Plan Continue with current plan of care     Progression Toward Goals   Progression toward goals Progressing toward goals          SLP Education - 07/18/16 1057    Education provided Yes   Education Details Executive skills she will need to demonstrate to validate independent living   Person(s) Educated Patient   Methods Explanation   Comprehension Verbalized understanding            SLP Long Term Goals - 06/23/16 1542      SLP LONG TERM GOAL #1   Title Patient will demonstrate functional cognitive-communication skills for independent completion of personal responsibilities.   Time 8   Period Weeks   Status Partially Met     SLP LONG TERM GOAL #2   Title Patient will independently complete complex executive function skills tasks with 80% accuracy.   Time 8   Period  Weeks   Status Partially Met     SLP LONG TERM GOAL #3   Title Patient will complete complex attention tasks with 80% accuracy.   Time 8   Period Weeks   Status Partially Met     SLP LONG TERM GOAL #4   Title Patient will complete memory strategy activities with 80% accuracy.   Time 8   Period Weeks   Status Partially Met     SLP LONG TERM GOAL #5   Title Patient will generate grammatical and cogent sentences to complete abstract/complex linguistic tasks with 80% accuracy   Time 8   Period Weeks   Status Partially Met          Plan - 07/18/16 1058    Clinical Impression Statement The patient demonstrated good working and short-term  memory for [employer] of familiar tasks.  She demonstrated difficulty with determining exactly what question was being asked secondary impulsive responses without thinking through the question.   In general, the patient demonstrates improved scanning for information in a complex setting and improved perseverance.  The patient is demonstrating behaviors that interfere with reading comprehension and new learning.  Behaviors include: impulsive responses, mis-reading words, reduced flexibility, reduced access to common knowledge, and faulty memory for information.  The patient is taking feedback regarding her responses and has improved in her ability to modify her response.  Will continue to assess/address executive skills necessary for independent living.   Speech Therapy Frequency 2x / week   Duration Other (comment)   Treatment/Interventions Compensatory techniques;Cognitive reorganization;Internal/external aids;Functional tasks;SLP instruction and feedback;Compensatory strategies;Patient/family education   Potential to Achieve Goals Good   Potential Considerations Ability to learn/carryover information;Cooperation/participation level;Previous level of function;Severity of impairments;Family/community support   SLP Home Exercise Plan Menu and grocery list task, logic problems   Consulted and Agree with Plan of Care Patient      Patient will benefit from skilled therapeutic intervention in order to improve the following deficits and impairments:   Cognitive communication deficit    Problem List There are no active problems to display for this patient.  Leroy Sea, MS/CCC- SLP  Lou Miner 07/18/2016, 10:58 AM  [place] MAIN [facility] SERVICES [address] [place], [place], [address] Phone: [phone]   Fax:  [phone]   Name: Natasha [name] MRN: [id number] Date of Birth: 1985-10-24

## 2016-07-22 ENCOUNTER — Ambulatory Visit: Payer: 59 | Attending: Pediatrics | Admitting: Physical Therapy

## 2016-07-22 ENCOUNTER — Encounter: Payer: 59 | Admitting: Occupational Therapy

## 2016-07-22 ENCOUNTER — Ambulatory Visit: Payer: Commercial Managed Care - HMO | Admitting: Speech Pathology

## 2016-07-22 ENCOUNTER — Encounter: Payer: Self-pay | Admitting: Physical Therapy

## 2016-07-22 ENCOUNTER — Ambulatory Visit: Payer: Commercial Managed Care - HMO | Admitting: Occupational Therapy

## 2016-07-22 ENCOUNTER — Ambulatory Visit: Payer: 59 | Admitting: Physical Therapy

## 2016-07-22 ENCOUNTER — Encounter: Payer: 59 | Admitting: Speech Pathology

## 2016-07-22 DIAGNOSIS — R41841 Cognitive communication deficit: Secondary | ICD-10-CM

## 2016-07-22 DIAGNOSIS — R278 Other lack of coordination: Secondary | ICD-10-CM | POA: Insufficient documentation

## 2016-07-22 DIAGNOSIS — M6281 Muscle weakness (generalized): Secondary | ICD-10-CM

## 2016-07-22 DIAGNOSIS — R29810 Facial weakness: Secondary | ICD-10-CM

## 2016-07-22 DIAGNOSIS — R262 Difficulty in walking, not elsewhere classified: Secondary | ICD-10-CM | POA: Insufficient documentation

## 2016-07-22 NOTE — Patient Instructions (Addendum)
OT TREATMENT     Neuro muscular re-education:  Therapeutic Exercise:  Selfcare:  Pt. worked on Customer service managersimulated check writing tasks and navigating monthly bills. Pt. was able to fill out the simulated checks, however it not fully complete the date, memo, and signature section on each check. Pt. Required cues to accurately subtract the initial balance on the check register. Pt. Required cues for navigating bills.  Manual Therapy:
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
## 2016-07-22 NOTE — Therapy (Signed)
[place] [facility] MAIN [facility] SERVICES [address] [place], [place], [address] Phone: [phone]   Fax:  [phone]  Physical Therapy Treatment  Patient Details  Name: ANISSIA WESSELLS MRN: [id number] Date of Birth: Feb 27, 1986 Referring Provider: Orene Desanctis  Encounter Date: 07/22/2016      PT End of Session - 07/22/16 1454    Visit Number 37   Number of Visits 49   Date for PT Re-Evaluation 09/10/16   Authorization Type 34 visits beginning 05/01/16    PT Start Time 0245   PT Stop Time 0330   PT Time Calculation (min) 45 min   Equipment Utilized During Treatment Gait belt   Activity Tolerance Patient tolerated treatment well;Patient limited by fatigue   Behavior During Therapy [facility] for tasks assessed/performed      Past Medical History:  Diagnosis Date  . Polysubstance abuse   . Restless leg syndrome unk    Past Surgical History:  Procedure Laterality Date  . CHOLECYSTECTOMY    . TUBAL LIGATION      There were no vitals filed for this visit.      Subjective Assessment - 07/22/16 1451    Subjective Pt reports she is feeling good. No new concerns .    Pertinent History TBI   Patient Stated Goals to be able to walk without the loftstrand crutches   Currently in Pain? No/denies   Pain Score 0-No pain   Pain Onset In the past 7 days   Multiple Pain Sites No      NMR: Standing side stepping left and right on foam in parallel bars with CGA x 10 x 3 Standing on blue foam with feet together and UE ball diagonals left and right x 20 CGA Standing on blue foam and tapping left and right x 20 CGA Standing on blue foam and step ups to 6 inch stool x 20  CGA Rocker board side to side and fwd / bwd x 2 mins and CGA Matrix x 4 fwd/bwd with 17. 5 lbs CGA  Therapeutic exercise: Leg press 20 x 3 100 lbs   CGA and Min  verbal cues used throughout with increased in postural sway and LOB most seen with narrow base of support and while on  uneven surfaces. Continues to have balance deficits typical with diagnosis. Patient performs high level exercises without pain behaviors                            PT Education - 07/22/16 1452    Education provided Yes   Education Details Safety with mobility   Person(s) Educated Patient   Methods Explanation   Comprehension Verbalized understanding             PT Long Term Goals - 07/15/16 1459      PT LONG TERM GOAL #1   Title Patient will reduce timed up and go to <11 seconds to reduce fall risk and demonstrate improved transfer/gait ability.   Baseline 11.05   Time 12   Period Weeks   Status Achieved     PT LONG TERM GOAL #2   Title Patient will increase six minute walk test distance to >1000 for progression to community ambulator and improve gait ability   Baseline 1000 feet   Time 12   Period Weeks   Status Achieved     PT LONG TERM GOAL #3   Title Patient will increase 10 meter walk  test to >1.7434m/s as to improve gait speed for better community ambulation and to reduce fall    Baseline .91 m/sec   Time 12   Period Weeks   Status On-going     PT LONG TERM GOAL #4   Title Patient will tolerate 5 seconds of single leg stance without loss of balance to improve ability to get in and out of shower safely   Baseline 2 seconds   Time 12   Period Weeks   Status On-going     PT LONG TERM GOAL #5   Title Patient (< 31 years old) will complete five times sit to stand test in < 10 seconds indicating an increased LE strength and improved balance   Baseline see note   Time 12   Period Weeks   Status On-going     Additional Long Term Goals   Additional Long Term Goals Yes               Plan - 07/22/16 1454    Clinical Impression Statement Patient demonstrates decreased dynamic standing balance and requires verbal and tactile cueing to maintain center of gravity during dynamic balance activities. Patient also requires CGA during all  dynamic standing balance activities. Patient will continue to benefit from skilled PT to improve balance and decrease falls   Rehab Potential Good   PT Frequency 2x / week   PT Duration 12 weeks   PT Treatment/Interventions Therapeutic activities;Therapeutic exercise;Balance training;Neuromuscular re-education;Stair training;Gait training;Patient/family education   PT Next Visit Plan strengthening and balance training; gait training   PT Home Exercise Plan As prescribed   Consulted and Agree with Plan of Care Patient      Patient will benefit from skilled therapeutic intervention in order to improve the following deficits and impairments:  Abnormal gait, Decreased balance, Decreased endurance, Difficulty walking, Decreased cognition, Decreased safety awareness, Decreased strength, Impaired UE functional use, Obesity  Visit Diagnosis: Muscle weakness (generalized)  Other lack of coordination  Difficulty in walking, not elsewhere classified  Generalized weakness     Problem List There are no active problems to display for this patient.  Ezekiel InaKristine S Solstice Lastinger, PT, DPT [place] 07/22/2016, 3:01 PM  [place] [facility] MAIN [facility] SERVICES [address] [place], [place], [address] Phone: [phone]   Fax:  [phone]  Name: Hector ShadeWhitney B Blank MRN: [id number] Date of Birth: 09/09/1985

## 2016-07-23 ENCOUNTER — Encounter: Payer: Self-pay | Admitting: Speech Pathology

## 2016-07-23 NOTE — Therapy (Signed)
[place] MAIN [facility] SERVICES [address] [place], [place], [address] Phone: [phone]   Fax:  [phone]  Speech Language Pathology Treatment  Patient Details  Name: CAMRY ROBELLO MRN: [id number] Date of Birth: 1985/08/31 Referring Provider: Barbaraann Boys  Encounter Date: 07/22/2016      End of Session - 07/23/16 1331    Visit Number 26   Number of Visits 37   Date for SLP Re-Evaluation 08/22/15   Authorization Type 12 of 30 SLP sessions for 2018   SLP Start Time 1600   SLP Stop Time  1700   SLP Time Calculation (min) 60 min   Activity Tolerance Patient tolerated treatment well      Past Medical History:  Diagnosis Date  . Polysubstance abuse   . Restless leg syndrome unk    Past Surgical History:  Procedure Laterality Date  . CHOLECYSTECTOMY    . TUBAL LIGATION      There were no vitals filed for this visit.      Subjective Assessment - 07/23/16 1329    Subjective The patient reports having difficulty with one of her children- she is referred to her counseler   Currently in Pain? No/denies               ADULT SLP TREATMENT - 07/23/16 0001      General Information   Behavior/Cognition Alert;Cooperative;Pleasant mood   HPI TBI     Treatment Provided   Treatment provided Cognitive-Linquistic     Pain Assessment   Pain Assessment No/denies pain     Cognitive-Linquistic Treatment   Treatment focused on Cognition;Patient/family/caregiver education   Skilled Treatment INDEPENDENCE/SELF-RESPONSIBILITY: The patient will not be moving to the assisted living home as she does not require the level of care provided at the facility.  The patient would like to live independently.  Patient given menu/grocery list task to complete at home.  The patient states that she is working on [employer], and brought several past assignments.  COMPREHENSION/ABSTRACT LANAGUAGE: Completed 2 Perplexors (Level C) with min  SLP cues.  Identify item that does not belong in a list with 90% accuracy, once task was explained.  Patient completed main idea/detail reading comprehension task.     Assessment / Recommendations / Plan   Plan Continue with current plan of care     Progression Toward Goals   Progression toward goals Progressing toward goals          SLP Education - 07/23/16 1329    Education provided Yes   Education Details Need to concentrate and persevere in new and/or difficult tasks   Person(s) Educated Patient   Methods Explanation   Comprehension Verbalized understanding            SLP Long Term Goals - 06/23/16 1542      SLP LONG TERM GOAL #1   Title Patient will demonstrate functional cognitive-communication skills for independent completion of personal responsibilities.   Time 8   Period Weeks   Status Partially Met     SLP LONG TERM GOAL #2   Title Patient will independently complete complex executive function skills tasks with 80% accuracy.   Time 8   Period Weeks   Status Partially Met     SLP LONG TERM GOAL #3   Title Patient will complete complex attention tasks with 80% accuracy.   Time 8   Period Weeks   Status Partially Met     SLP LONG TERM GOAL #  4   Title Patient will complete memory strategy activities with 80% accuracy.   Time 8   Period Weeks   Status Partially Met     SLP LONG TERM GOAL #5   Title Patient will generate grammatical and cogent sentences to complete abstract/complex linguistic tasks with 80% accuracy   Time 8   Period Weeks   Status Partially Met          Plan - 07/23/16 1331    Clinical Impression Statement The patient demonstrated good working and short-term memory for [employer] of familiar tasks.  She demonstrated difficulty with switching sets and learning new tasks.   In general, the patient demonstrates improved scanning for information in a complex setting and improved perseverance.  The patient is demonstrating  behaviors that interfere with reading comprehension and new learning.  Behaviors include: impulsive responses, mis-reading words, reduced flexibility, reduced access to common knowledge, and faulty memory for information.  The patient is taking feedback regarding her responses and has improved in her ability to modify her response.  Will continue to assess/address executive skills necessary for independent living.   Speech Therapy Frequency 2x / week   Duration Other (comment)   Treatment/Interventions Compensatory techniques;Cognitive reorganization;Internal/external aids;Functional tasks;SLP instruction and feedback;Compensatory strategies;Patient/family education   Potential to Achieve Goals Good   Potential Considerations Ability to learn/carryover information;Cooperation/participation level;Previous level of function;Severity of impairments;Family/community support   SLP Home Exercise Plan Menu and grocery list task, logic problems   Consulted and Agree with Plan of Care Patient      Patient will benefit from skilled therapeutic intervention in order to improve the following deficits and impairments:   Cognitive communication deficit    Problem List There are no active problems to display for this patient.  Leroy Sea, MS/CCC- SLP  Lou Miner 07/23/2016, 1:32 PM  [place] MAIN [facility] SERVICES [address] [place], [place], [address] Phone: [phone]   Fax:  [phone]   Name: MAZELLA DEEN MRN: [id number] Date of Birth: 26-Apr-1986

## 2016-07-24 ENCOUNTER — Encounter: Payer: Self-pay | Admitting: Speech Pathology

## 2016-07-24 ENCOUNTER — Ambulatory Visit: Payer: Commercial Managed Care - HMO | Admitting: Speech Pathology

## 2016-07-24 DIAGNOSIS — R41841 Cognitive communication deficit: Secondary | ICD-10-CM

## 2016-07-24 NOTE — Therapy (Signed)
[place] MAIN [facility] SERVICES [address] [place], [place], [address] Phone: [phone]   Fax:  [phone]  Speech Language Pathology Treatment  Patient Details  Name: Natasha [name] MRN: [id number] Date of Birth: 12-29-1985 Referring Provider: Barbaraann Boys  Encounter Date: 07/24/2016      End of Session - 07/24/16 1529    Visit Number 27   Number of Visits 37   Date for SLP Re-Evaluation 08/22/15   Authorization Type 13 of 30 SLP sessions for 2018   SLP Start Time 1400   SLP Stop Time  1500   SLP Time Calculation (min) 60 min   Activity Tolerance Patient tolerated treatment well      Past Medical History:  Diagnosis Date  . Polysubstance abuse   . Restless leg syndrome unk    Past Surgical History:  Procedure Laterality Date  . CHOLECYSTECTOMY    . TUBAL LIGATION      There were no vitals filed for this visit.      Subjective Assessment - 07/24/16 1528    Subjective The patient is eager be independent   Currently in Pain? No/denies               ADULT SLP TREATMENT - 07/24/16 0001      General Information   Behavior/Cognition Alert;Cooperative;Pleasant mood   HPI TBI     Treatment Provided   Treatment provided Cognitive-Linquistic     Pain Assessment   Pain Assessment No/denies pain     Cognitive-Linquistic Treatment   Treatment focused on Cognition;Patient/family/caregiver education   Skilled Treatment INDEPENDENCE/SELF-RESPONSIBILITY: The patient will not be moving to the assisted living home as she does not require the level of care provided at the facility.  The patient would like to live independently.  Patient given menu/grocery list task to complete at home.  The patient states that she is working on [employer], and brought several past assignments. The patient was able to complete a weekly scheduling task with min SLP cues for understanding the layout of the particular schedule.  The  patient is able to outline what she has to achieve in order to [facility].  She is having difficulty problem solving transportation when her family is not able to provide transportation.  She is encouraged to call the birth certificate office for means of getting her birth certificate via mail.  The patient has a daily/weekly schedule to be signed off by her mother.     Assessment / Recommendations / Plan   Plan Continue with current plan of care     Progression Toward Goals   Progression toward goals Progressing toward goals          SLP Education - 07/24/16 1529    Education provided Yes   Education Details Need to concentrate and perservere in new and/or difficult tasks   Person(s) Educated Patient   Methods Explanation   Comprehension Verbalized understanding            SLP Long Term Goals - 06/23/16 1542      SLP LONG TERM GOAL #1   Title Patient will demonstrate functional cognitive-communication skills for independent completion of personal responsibilities.   Time 8   Period Weeks   Status Partially Met     SLP LONG TERM GOAL #2   Title Patient will independently complete complex executive function skills tasks with 80% accuracy.   Time 8   Period Weeks   Status  Partially Met     SLP LONG TERM GOAL #3   Title Patient will complete complex attention tasks with 80% accuracy.   Time 8   Period Weeks   Status Partially Met     SLP LONG TERM GOAL #4   Title Patient will complete memory strategy activities with 80% accuracy.   Time 8   Period Weeks   Status Partially Met     SLP LONG TERM GOAL #5   Title Patient will generate grammatical and cogent sentences to complete abstract/complex linguistic tasks with 80% accuracy   Time 8   Period Weeks   Status Partially Met          Plan - 07/24/16 1530    Clinical Impression Statement The patient demonstrated good working and short-term memory for [employer] of familiar tasks.  She demonstrated  difficulty with switching sets and learning new tasks.   In general, the patient demonstrates improved scanning for information in a complex setting and improved perseverance.  The patient is demonstrating behaviors that interfere with reading comprehension and new learning.  Behaviors include: impulsive responses, mis-reading words, reduced flexibility, reduced access to common knowledge, and faulty memory for information.  The patient is taking feedback regarding her responses and has improved in her ability to modify her response.  Will continue to assess/address executive skills necessary for independent living.   Speech Therapy Frequency 2x / week   Duration Other (comment)   Treatment/Interventions Compensatory techniques;Cognitive reorganization;Internal/external aids;Functional tasks;SLP instruction and feedback;Compensatory strategies;Patient/family education   Potential to Achieve Goals Good   Potential Considerations Ability to learn/carryover information;Cooperation/participation level;Previous level of function;Severity of impairments;Family/community support   SLP Home Exercise Plan Menu and grocery list task, logic problems, main idea   Consulted and Agree with Plan of Care Patient      Patient will benefit from skilled therapeutic intervention in order to improve the following deficits and impairments:   Cognitive communication deficit    Problem List There are no active problems to display for this patient.  Leroy Sea, MS/CCC- SLP  Lou Miner 07/24/2016, 3:31 PM  [place] MAIN [facility] SERVICES [address] [place], [place], [address] Phone: [phone]   Fax:  [phone]   Name: Natasha [name] MRN: [id number] Date of Birth: [birth year]

## 2016-07-29 ENCOUNTER — Encounter: Payer: Self-pay | Admitting: Physical Therapy

## 2016-07-29 ENCOUNTER — Encounter: Payer: Self-pay | Admitting: Occupational Therapy

## 2016-07-29 ENCOUNTER — Ambulatory Visit: Payer: Commercial Managed Care - HMO | Attending: Pediatrics | Admitting: Occupational Therapy

## 2016-07-29 ENCOUNTER — Ambulatory Visit: Payer: Commercial Managed Care - HMO | Admitting: Speech Pathology

## 2016-07-29 ENCOUNTER — Ambulatory Visit: Payer: Commercial Managed Care - HMO | Admitting: Physical Therapy

## 2016-07-29 DIAGNOSIS — R41841 Cognitive communication deficit: Secondary | ICD-10-CM

## 2016-07-29 DIAGNOSIS — M6281 Muscle weakness (generalized): Secondary | ICD-10-CM

## 2016-07-29 DIAGNOSIS — R278 Other lack of coordination: Secondary | ICD-10-CM | POA: Diagnosis present

## 2016-07-29 DIAGNOSIS — R262 Difficulty in walking, not elsewhere classified: Secondary | ICD-10-CM

## 2016-07-29 DIAGNOSIS — R29810 Facial weakness: Secondary | ICD-10-CM | POA: Diagnosis present

## 2016-07-29 NOTE — Therapy (Addendum)
[facility] MAIN [facility] SERVICES [address] [place], [place], [address] Phone: [phone]   Fax:  [phone]  Physical Therapy Treatment  Patient Details  Name: Natasha [name] MRN: [id number] Date of Birth: Jun 27, 1985 Referring Provider: Orene Desanctis  Encounter Date: 07/29/2016      PT End of Session - 07/29/16 0953    Visit Number 38   Number of Visits 49   Date for PT Re-Evaluation 09/10/16   Authorization Type 34 visits beginning 05/01/16    PT Start Time 0945   PT Stop Time 1030   PT Time Calculation (min) 45 min   Equipment Utilized During Treatment Gait belt   Activity Tolerance Patient tolerated treatment well;Patient limited by fatigue   Behavior During Therapy [facility] for tasks assessed/performed      Past Medical History:  Diagnosis Date  . Polysubstance abuse   . Restless leg syndrome unk    Past Surgical History:  Procedure Laterality Date  . CHOLECYSTECTOMY    . TUBAL LIGATION      There were no vitals filed for this visit.      Subjective Assessment - 07/29/16 0953    Subjective Pt reports she is feeling good. No new concerns .    Pertinent History TBI   Patient Stated Goals to be able to walk without the loftstrand crutches   Currently in Pain? No/denies   Pain Score 0-No pain   Pain Onset In the past 7 days   Multiple Pain Sites No      NEUROMUSCULAR RE-EDUCATION  Airex NBOS eyes open x 30 seconds  X 3 each;cues for posture and technique and CGA Airex NBOS eyes open horizontal and vertical head turns x 30 seconds;cues for posture and technique and CGA Airex  cone reaching crossing midline cues for posture and technique and CGA Toe tapping 6 inch stool without UE assistcues for posture and technique and CGA Tandem gait in // bars x 4 laps cues for posture and technique and CGA Side stepping on blue foam balance beam x 5 lengths of the parallel bars cues for posture and technique and CGA Step ups to 6  inch stool x 20 x 2.   Gait training : Ambulation without assistive device and  CGA in  Level surfaces and inclines 4000 feet with head turns and distractions   Patient has wide base of support and has weight shift side to side during ambulation without AD                            PT Education - 07/29/16 0953    Education provided Yes   Education Details gait and safety with mobility   Person(s) Educated Patient   Methods Explanation   Comprehension Verbalized understanding             PT Long Term Goals - 07/15/16 1459      PT LONG TERM GOAL #1   Title Patient will reduce timed up and go to <11 seconds to reduce fall risk and demonstrate improved transfer/gait ability.   Baseline 11.05   Time 12   Period Weeks   Status Achieved     PT LONG TERM GOAL #2   Title Patient will increase six minute walk test distance to >1000 for progression to community ambulator and improve gait ability   Baseline 1000 feet   Time 12   Period Weeks   Status Achieved  PT LONG TERM GOAL #3   Title Patient will increase 10 meter walk test to >1.22m/s as to improve gait speed for better community ambulation and to reduce fall    Baseline .91 m/sec   Time 12   Period Weeks   Status On-going     PT LONG TERM GOAL #4   Title Patient will tolerate 5 seconds of single leg stance without loss of balance to improve ability to get in and out of shower safely   Baseline 2 seconds   Time 12   Period Weeks   Status On-going     PT LONG TERM GOAL #5   Title Patient (< 6 years old) will complete five times sit to stand test in < 10 seconds indicating an increased LE strength and improved balance   Baseline see note   Time 12   Period Weeks   Status On-going     Additional Long Term Goals   Additional Long Term Goals Yes               Plan - 07/29/16 1014    Clinical Impression Statement Patient required min verbal cueing during sorting balls exercise to  correct posture and form. Patient demonstrates ability to perform strengthening exercises and dynamic standing balance exercises, and gait training without assistive device but with moderate fatigue.Patient has weakness in BLE power as evidenced with decreased TUG and LE trembling during closed chain exercises.  Patient will continue to benefit from continued skilled therapy in order to improve dynamic standing balance and increase strength in order to decrease falls   Rehab Potential Good   PT Frequency 2x / week   PT Duration 12 weeks   PT Treatment/Interventions Therapeutic activities;Therapeutic exercise;Balance training;Neuromuscular re-education;Stair training;Gait training;Patient/family education   PT Next Visit Plan strengthening and balance training; gait training   PT Home Exercise Plan As prescribed   Consulted and Agree with Plan of Care Patient      Patient will benefit from skilled therapeutic intervention in order to improve the following deficits and impairments:  Abnormal gait, Decreased balance, Decreased endurance, Difficulty walking, Decreased cognition, Decreased safety awareness, Decreased strength, Impaired UE functional use, Obesity  Visit Diagnosis: Muscle weakness (generalized)  Other lack of coordination  Difficulty in walking, not elsewhere classified     Problem List There are no active problems to display for this patient. [name], PT, DPT [place], Barkley Bruns S 07/29/2016, 10:36 AM  [facility] MAIN [facility] SERVICES [address] [place], [place], [address] Phone: [phone]   Fax:  [phone]  Name: Natasha [name] MRN: [id number] Date of Birth: December 01, 1985

## 2016-07-30 ENCOUNTER — Encounter: Payer: Self-pay | Admitting: Speech Pathology

## 2016-07-30 NOTE — Therapy (Signed)
[place] MAIN [facility] SERVICES [address] [place], [place], [address] Phone: [phone]   Fax:  [phone]  Speech Language Pathology Treatment  Patient Details  Name: Natasha [name] MRN: [id number] Date of Birth: 07-18-1985 Referring Provider: Barbaraann Boys  Encounter Date: 07/29/2016      End of Session - 07/30/16 0956    Visit Number 28   Number of Visits 37   Date for SLP Re-Evaluation 08/22/15   Authorization Type 14 of 30 SLP sessions for 2018   SLP Start Time 1100   SLP Stop Time  1157   SLP Time Calculation (min) 57 min   Activity Tolerance Patient tolerated treatment well      Past Medical History:  Diagnosis Date  . Polysubstance abuse   . Restless leg syndrome unk    Past Surgical History:  Procedure Laterality Date  . CHOLECYSTECTOMY    . TUBAL LIGATION      There were no vitals filed for this visit.      Subjective Assessment - 07/30/16 0955    Subjective The patient is eager be independent   Currently in Pain? No/denies               ADULT SLP TREATMENT - 07/30/16 0001      General Information   Behavior/Cognition Alert;Cooperative;Pleasant mood   HPI TBI     Treatment Provided   Treatment provided Cognitive-Linquistic     Pain Assessment   Pain Assessment No/denies pain     Cognitive-Linquistic Treatment   Treatment focused on Cognition;Patient/family/caregiver education   Skilled Treatment INDEPENDENCE/SELF-RESPONSIBILITY: The patient will not be moving to the assisted living home as she does not require the level of care provided at the facility.  The patient would like to live independently.  Patient given menu/grocery list task to complete at home.  The patient states that she is working on [employer], and brought several past assignments. The patient was able to complete a weekly scheduling task with min SLP cues for understanding the layout of the particular schedule.  The  patient is able to outline what she has to achieve in order to go to [facility].  COMPREHENSION/ABSTRACT LANAGUAGE: Completed 1 Perplexors (Level C) independently.  Patient completed main idea/detail reading comprehension task.  Patient able to generate grammatical and cogent sentences to complete simple cognitive linguistic task with minimally vague language.  Patient able to complete verbal analogies accurately, but requires cues for specificity to explain relationship.     Assessment / Recommendations / Plan   Plan Continue with current plan of care     Progression Toward Goals   Progression toward goals Progressing toward goals          SLP Education - 07/30/16 0955    Education provided Yes   Education Details Clarifying vague language   Person(s) Educated Patient   Methods Explanation   Comprehension Verbalized understanding            SLP Long Term Goals - 06/23/16 1542      SLP LONG TERM GOAL #1   Title Patient will demonstrate functional cognitive-communication skills for independent completion of personal responsibilities.   Time 8   Period Weeks   Status Partially Met     SLP LONG TERM GOAL #2   Title Patient will independently complete complex executive function skills tasks with 80% accuracy.   Time 8   Period Weeks   Status Partially Met  SLP LONG TERM GOAL #3   Title Patient will complete complex attention tasks with 80% accuracy.   Time 8   Period Weeks   Status Partially Met     SLP LONG TERM GOAL #4   Title Patient will complete memory strategy activities with 80% accuracy.   Time 8   Period Weeks   Status Partially Met     SLP LONG TERM GOAL #5   Title Patient will generate grammatical and cogent sentences to complete abstract/complex linguistic tasks with 80% accuracy   Time 8   Period Weeks   Status Partially Met          Plan - 07/30/16 0957    Clinical Impression Statement The patient demonstrated good working and short-term  memory for [employer] of familiar tasks.  She demonstrated difficulty with switching sets and learning new tasks.   In general, the patient demonstrates improved scanning for information in a complex setting and improved perseverance.  The patient is demonstrating behaviors that interfere with reading comprehension and new learning.  Behaviors include: impulsive responses, mis-reading words, reduced flexibility, reduced access to common knowledge, and faulty memory for information.  The patient is taking feedback regarding her responses and has improved in her ability to modify her response.  Will continue to assess/address executive skills necessary for independent living.   Speech Therapy Frequency 2x / week   Duration Other (comment)   Treatment/Interventions Compensatory techniques;Cognitive reorganization;Internal/external aids;Functional tasks;SLP instruction and feedback;Compensatory strategies;Patient/family education   Potential to Achieve Goals Good   Potential Considerations Ability to learn/carryover information;Cooperation/participation level;Previous level of function;Severity of impairments;Family/community support   SLP Home Exercise Plan Menu and grocery list task, logic problems, main idea   Consulted and Agree with Plan of Care Patient      Patient will benefit from skilled therapeutic intervention in order to improve the following deficits and impairments:   Cognitive communication deficit    Problem List There are no active problems to display for this patient.  Leroy Sea, MS/CCC- SLP  Lou Miner 07/30/2016, 9:58 AM  [place] MAIN [facility] SERVICES [address] [place], [place], [address] Phone: [phone]   Fax:  [phone]   Name: Natasha [name] MRN: [id number] Date of Birth: 1986-04-07

## 2016-07-31 ENCOUNTER — Ambulatory Visit: Payer: Commercial Managed Care - HMO | Admitting: Speech Pathology

## 2016-07-31 ENCOUNTER — Encounter: Payer: Self-pay | Admitting: Speech Pathology

## 2016-07-31 DIAGNOSIS — R41841 Cognitive communication deficit: Secondary | ICD-10-CM

## 2016-07-31 DIAGNOSIS — M6281 Muscle weakness (generalized): Secondary | ICD-10-CM | POA: Diagnosis not present

## 2016-07-31 NOTE — Therapy (Signed)
[place] MAIN [facility] SERVICES [address] [place], [place], [address] Phone: [phone]   Fax:  [phone]  Speech Language Pathology Treatment  Patient Details  Name: Natasha [name] MRN: [id number] Date of Birth: 22-Jan-1986 Referring Provider: Barbaraann Boys  Encounter Date: 07/31/2016      End of Session - 07/31/16 1740    Visit Number 29   Number of Visits 37   Date for SLP Re-Evaluation 08/22/15   Authorization Type 15 of 30 SLP sessions for 2018   SLP Start Time 1300   SLP Stop Time  1400   SLP Time Calculation (min) 60 min   Activity Tolerance Patient tolerated treatment well      Past Medical History:  Diagnosis Date  . Polysubstance abuse   . Restless leg syndrome unk    Past Surgical History:  Procedure Laterality Date  . CHOLECYSTECTOMY    . TUBAL LIGATION      There were no vitals filed for this visit.      Subjective Assessment - 07/31/16 1739    Subjective The patient is eager be independent.     Currently in Pain? No/denies               ADULT SLP TREATMENT - 07/31/16 0001      General Information   Behavior/Cognition Alert;Cooperative;Pleasant mood   HPI TBI     Treatment Provided   Treatment provided Cognitive-Linquistic     Pain Assessment   Pain Assessment No/denies pain     Cognitive-Linquistic Treatment   Treatment focused on Cognition;Patient/family/caregiver education   Skilled Treatment INDEPENDENCE/SELF-RESPONSIBILITY: The patient will not be moving to the assisted living home as she does not require the level of care provided at the facility.  The patient would like to live independently.  Patient given menu/grocery list task to complete at home.  The patient states that she is working on [employer], and brought several past assignments. Patient able to complete an independent cognitive evaluation form with min cues to problem solve questions she had.  Patient demonstrates  decreased perseverance and problem solving skills.  Reduced abstract language affects her ability to adequately communicate her own competence.  COMPREHENSION/ABSTRACT LANAGUAGE: Patient able to generate grammatical and cogent sentences to complete simple cognitive linguistic task with minimally vague language.  The patient had significant difficulty defining words, using very vague language with this task.  Specificity improved as she continued to work      Assessment / Recommendations / [place] with current plan of care     Progression Toward Goals   Progression toward goals Progressing toward goals          SLP Education - 07/31/16 1739    Education provided Yes   Education Details Clarifying vague language will improve her ability to communicate her competence to live independently   Person(s) Educated Patient   Methods Explanation   Comprehension Verbalized understanding            SLP Long Term Goals - 06/23/16 1542      SLP LONG TERM GOAL #1   Title Patient will demonstrate functional cognitive-communication skills for independent completion of personal responsibilities.   Time 8   Period Weeks   Status Partially Met     SLP LONG TERM GOAL #2   Title Patient will independently complete complex executive function skills tasks with 80% accuracy.   Time 8   Period Weeks   Status  Partially Met     SLP LONG TERM GOAL #3   Title Patient will complete complex attention tasks with 80% accuracy.   Time 8   Period Weeks   Status Partially Met     SLP LONG TERM GOAL #4   Title Patient will complete memory strategy activities with 80% accuracy.   Time 8   Period Weeks   Status Partially Met     SLP LONG TERM GOAL #5   Title Patient will generate grammatical and cogent sentences to complete abstract/complex linguistic tasks with 80% accuracy   Time 8   Period Weeks   Status Partially Met          Plan - 07/31/16 1741    Clinical Impression  Statement The patient demonstrated good working and short-term memory for [employer] of familiar tasks.  She demonstrated difficulty with switching sets and learning new tasks.   In general, the patient demonstrates improved scanning for information in a complex setting and improved perseverance.  The patient is demonstrating behaviors that interfere with reading comprehension and new learning.  Behaviors include: impulsive responses, mis-reading words, reduced flexibility, reduced access to common knowledge, and faulty memory for information.  The patient is taking feedback regarding her responses and has improved in her ability to modify her response.  Will continue to assess/address executive skills necessary for independent living.   Speech Therapy Frequency 2x / week   Duration Other (comment)   Treatment/Interventions Compensatory techniques;Cognitive reorganization;Internal/external aids;Functional tasks;SLP instruction and feedback;Compensatory strategies;Patient/family education   Potential to Achieve Goals Good   Potential Considerations Ability to learn/carryover information;Cooperation/participation level;Previous level of function;Severity of impairments;Family/community support   SLP Home Exercise Plan Evaluating solutions worksheet   Consulted and Agree with Plan of Care Patient      Patient will benefit from skilled therapeutic intervention in order to improve the following deficits and impairments:   Cognitive communication deficit    Problem List There are no active problems to display for this patient.  Leroy Sea, MS/CCC- SLP  Lou Miner 07/31/2016, 5:42 PM  [place] MAIN [facility] SERVICES [address] [place], [place], [address] Phone: [phone]   Fax:  [phone]   Name: Natasha [name] MRN: [id number] Date of Birth: [birth year]
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
## 2016-08-04 ENCOUNTER — Ambulatory Visit: Payer: Commercial Managed Care - HMO | Admitting: Physical Therapy

## 2016-08-04 ENCOUNTER — Encounter: Payer: Self-pay | Admitting: Speech Pathology

## 2016-08-04 ENCOUNTER — Encounter: Payer: Self-pay | Admitting: Physical Therapy

## 2016-08-04 ENCOUNTER — Ambulatory Visit: Payer: Commercial Managed Care - HMO | Admitting: Occupational Therapy

## 2016-08-04 ENCOUNTER — Ambulatory Visit: Payer: 59 | Attending: Pediatrics | Admitting: Speech Pathology

## 2016-08-04 DIAGNOSIS — R262 Difficulty in walking, not elsewhere classified: Secondary | ICD-10-CM

## 2016-08-04 DIAGNOSIS — R278 Other lack of coordination: Secondary | ICD-10-CM

## 2016-08-04 DIAGNOSIS — R41841 Cognitive communication deficit: Secondary | ICD-10-CM | POA: Insufficient documentation

## 2016-08-04 DIAGNOSIS — M6281 Muscle weakness (generalized): Secondary | ICD-10-CM | POA: Diagnosis not present

## 2016-08-04 NOTE — Patient Instructions (Addendum)
OT TREATMENT     Neuro muscular re-education:  Therapeutic Exercise:  Selfcare:  Pt. worked on filling out schedules, and calendars. Pt. was able to accurately fill out a calendar. Pt. required cues to  consider spacing when filling them in. Pt. had difficulty with writing small enough to fit all the items in the calendar.    Manual Therapy:
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
## 2016-08-04 NOTE — Therapy (Signed)
[facility] MAIN [facility] SERVICES [address] [place], [place], [address] Phone: [phone]   Fax:  [phone]  Physical Therapy Treatment  Patient Details  Name: Natasha [name] MRN: [id number] Date of Birth: [birth year] Referring Provider: Orene Desanctis  Encounter Date: 08/04/2016      PT End of Session - 08/04/16 1310    Visit Number 39   Number of Visits 49   Date for PT Re-Evaluation 09/10/16   Authorization Type 34 visits beginning 05/01/16    PT Start Time 0104   PT Stop Time 0145   PT Time Calculation (min) 41 min   Equipment Utilized During Treatment Gait belt   Activity Tolerance Patient tolerated treatment well;Patient limited by fatigue   Behavior During Therapy [facility] for tasks assessed/performed      Past Medical History:  Diagnosis Date  . Polysubstance abuse   . Restless leg syndrome unk    Past Surgical History:  Procedure Laterality Date  . CHOLECYSTECTOMY    . TUBAL LIGATION      There were no vitals filed for this visit.      Subjective Assessment - 08/04/16 1309    Subjective Pt reports she is feeling good. No new concerns . She is able to ambulate without loftstrand crutch indoors and uses one crutch outdoors.   Pertinent History TBI   Patient Stated Goals to be able to walk without the loftstrand crutches   Currently in Pain? No/denies   Pain Score 0-No pain   Pain Onset In the past 7 days   Multiple Pain Sites No        GAIT TRAINING gait training on treadmill x 5 min with "gait training" setting 1. , BUE support ; min-mod verbal cues to look up CGA gait training in hallway w/ CGA and min verbal cues to look up, w/ 1 loftstrand crutch, 3000 ft x 4   NMR Toe tapping 6 inch step x 20 x 2 Step ups from blue foam to 6 inch stool x 20 x 2  Patient needs CGA and posture correction cues for all above exercises.                           PT Education - 08/04/16 1310    Education provided Yes   Education Details exercise progression   Person(s) Educated Patient   Methods Explanation   Comprehension Verbalized understanding             PT Long Term Goals - 07/15/16 1459      PT LONG TERM GOAL #1   Title Patient will reduce timed up and go to <11 seconds to reduce fall risk and demonstrate improved transfer/gait ability.   Baseline 11.05   Time 12   Period Weeks   Status Achieved     PT LONG TERM GOAL #2   Title Patient will increase six minute walk test distance to >1000 for progression to community ambulator and improve gait ability   Baseline 1000 feet   Time 12   Period Weeks   Status Achieved     PT LONG TERM GOAL #3   Title Patient will increase 10 meter walk test to >1.66m/s as to improve gait speed for better community ambulation and to reduce fall    Baseline .91 m/sec   Time 12   Period Weeks   Status On-going     PT LONG TERM GOAL #4   Title  Patient will tolerate 5 seconds of single leg stance without loss of balance to improve ability to get in and out of shower safely   Baseline 2 seconds   Time 12   Period Weeks   Status On-going     PT LONG TERM GOAL #5   Title Patient (< 63 years old) will complete five times sit to stand test in < 10 seconds indicating an increased LE strength and improved balance   Baseline see note   Time 12   Period Weeks   Status On-going     Additional Long Term Goals   Additional Long Term Goals Yes               Plan - 08/04/16 1311    Clinical Impression Statement Patient instructed in advanced strengthening and balance exercise. She requires min Vcs for correct exercise technique including to improve trunk control with standing exercise. Patient demonstrates better quad control with SLS tasks with rail assist. Patient would benefit from additional skilled PT intervention to improve balance/gait safety and reduce fall risk;   Rehab Potential Good   PT Frequency 2x / week   PT  Duration 12 weeks   PT Treatment/Interventions Therapeutic activities;Therapeutic exercise;Balance training;Neuromuscular re-education;Stair training;Gait training;Patient/family education   PT Next Visit Plan strengthening and balance training; gait training   PT Home Exercise Plan As prescribed   Consulted and Agree with Plan of Care Patient      Patient will benefit from skilled therapeutic intervention in order to improve the following deficits and impairments:  Abnormal gait, Decreased balance, Decreased endurance, Difficulty walking, Decreased cognition, Decreased safety awareness, Decreased strength, Impaired UE functional use, Obesity  Visit Diagnosis: Muscle weakness (generalized)  Other lack of coordination  Difficulty in walking, not elsewhere classified     Problem List There are no active problems to display for this patient.  [name], PT, DPT [place], [place] 08/04/2016, 1:14 PM  [place] [facility] MAIN [facility] SERVICES [address] [place], [place], [address] Phone: [phone]   Fax:  [phone]  Name: Natasha [name] MRN: [id number] Date of Birth: Sep 08, 1985

## 2016-08-04 NOTE — Therapy (Signed)
[place] MAIN [facility] SERVICES [address] [place], [place], [address] Phone: [phone]   Fax:  [phone]  Speech Language Pathology Treatment  Patient Details  Name: Natasha [name] MRN: [id number] Date of Birth: [birth year] Referring Provider: Barbaraann Boys  Encounter Date: 08/04/2016      End of Session - 08/04/16 1522    Visit Number 30   Number of Visits 37   Date for SLP Re-Evaluation 08/22/15   SLP Start Time 86   SLP Stop Time  1510   SLP Time Calculation (min) 60 min   Activity Tolerance Patient tolerated treatment well      Past Medical History:  Diagnosis Date  . Polysubstance abuse   . Restless leg syndrome unk    Past Surgical History:  Procedure Laterality Date  . CHOLECYSTECTOMY    . TUBAL LIGATION      There were no vitals filed for this visit.      Subjective Assessment - 08/04/16 1521    Subjective The patient is eager be independent.     Currently in Pain? No/denies      Cognitive-Linguistic Tasks/Tx: WRITING CLEAR DEFINITIONS OF HOMONYMS:  Patient able to define homonyms with 95% accuracy with verbal cueing to describe without using the word provided. The patient had significant difficulty defining words, using very vague language with this task.  EXPLAING AND CORRECTING ABSURDITIES: patient able to state absurd situation with 100% accuracy and no cueing, patient able to explain "why" with 80% accuracy and cueing on clear, precise wording. EVALUATING SOLUTIONS: patient able to evaluate good and bad solutions to problems with 100% accuracy, patient able to state "why" solution bad/good with 95% accuracy and min verbal cueing for elaboration. CLEARY IDENDIFING FUNCTION INFORMATION USED TO COMPLETE DAILY TASKS: exhibited some initial success with task requiring verbal cues to elaborate. Will continue with this task next session.            SLP Education - 08/04/16 1521    Education provided Yes   Education Details HEP reviewed; exercises for homework discussed   Person(s) Educated Patient   Methods Explanation   Comprehension Verbalized understanding            SLP Long Term Goals - 06/23/16 1542      SLP LONG TERM GOAL #1   Title Patient will demonstrate functional cognitive-communication skills for independent completion of personal responsibilities.   Time 8   Period Weeks   Status Partially Met     SLP LONG TERM GOAL #2   Title Patient will independently complete complex executive function skills tasks with 80% accuracy.   Time 8   Period Weeks   Status Partially Met     SLP LONG TERM GOAL #3   Title Patient will complete complex attention tasks with 80% accuracy.   Time 8   Period Weeks   Status Partially Met     SLP LONG TERM GOAL #4   Title Patient will complete memory strategy activities with 80% accuracy.   Time 8   Period Weeks   Status Partially Met     SLP LONG TERM GOAL #5   Title Patient will generate grammatical and cogent sentences to complete abstract/complex linguistic tasks with 80% accuracy   Time 8   Period Weeks   Status Partially Met          Plan - 08/04/16 1523    Clinical Impression Statement The patient demonstrated good working and short-term memory for  memory/comprehension of familiar tasks.  She is improving on with switching sets and learning new tasks.   In general, the patient demonstrates improved scanning for information in a complex setting and improved perseverance.  The patient is demonstrating few behaviors that interfere with reading comprehension and new learning.  Behaviors include: impulsive responses, mis-reading words, reduced flexibility, reduced access to common knowledge.  The patient is taking feedback well regarding her responses and has improved in her ability to modify her response given cues.  Will continue to assess/address executive skills necessary for independent living.   Speech Therapy Frequency 2x /  week   Duration Other (comment)   Treatment/Interventions Compensatory techniques;Cognitive reorganization;Internal/external aids;Functional tasks;SLP instruction and feedback;Compensatory strategies;Patient/family education   Potential to Achieve Goals Good   Potential Considerations Ability to learn/carryover information;Cooperation/participation level;Previous level of function;Severity of impairments;Family/community support   SLP Home Exercise Plan Evaluating solutions worksheet   Consulted and Agree with Plan of Care Patient      Patient will benefit from skilled therapeutic intervention in order to improve the following deficits and impairments:   Cognitive communication deficit    Problem List There are no active problems to display for this patient.   Watson,Katherine 08/04/2016, 3:43 PM  [place] MAIN [facility] SERVICES [address] [place], [place], [address] Phone: [phone]   Fax:  [phone]   Name: Natasha [name] MRN: [id number] Date of Birth: [birth year]

## 2016-08-07 ENCOUNTER — Encounter: Payer: Self-pay | Admitting: Speech Pathology

## 2016-08-07 ENCOUNTER — Ambulatory Visit: Payer: Commercial Managed Care - HMO | Admitting: Speech Pathology

## 2016-08-07 DIAGNOSIS — R41841 Cognitive communication deficit: Secondary | ICD-10-CM

## 2016-08-07 DIAGNOSIS — M6281 Muscle weakness (generalized): Secondary | ICD-10-CM | POA: Diagnosis not present

## 2016-08-07 NOTE — Therapy (Signed)
[place] MAIN [facility] SERVICES [address] [place], [place], [address] Phone: [phone]   Fax:  [phone]  Speech Language Pathology Treatment  Patient Details  Name: Natasha [name] MRN: [id number] Date of Birth: [birth year] Referring Provider: Barbaraann Boys  Encounter Date: 08/07/2016      End of Session - 08/07/16 1455    Visit Number 31   Number of Visits 37   Date for SLP Re-Evaluation 08/22/15   Authorization Type 15 of 30 SLP sessions for 2018   SLP Start Time 1255   SLP Stop Time  6301   SLP Time Calculation (min) 60 min   Activity Tolerance Patient tolerated treatment well      Past Medical History:  Diagnosis Date  . Polysubstance abuse   . Restless leg syndrome unk    Past Surgical History:  Procedure Laterality Date  . CHOLECYSTECTOMY    . TUBAL LIGATION      There were no vitals filed for this visit.      Subjective Assessment - 08/07/16 1441    Subjective Patient stated she is eager to live in her "own place" and have better relationships w/ her children "who live w/ their father now".   Currently in Pain? No/denies        Cognitive-Linguistic Tasks/Tx:  WRITING CLEAR DEFINITIONS OF HOMONYMS:  Patient able to define homonyms with 70% acc. Independently; with verbal cueing to describe without using the word provided she improved her acc. to 90%.  EXPLAING AND CORRECTING ABSURDITIES: patient able to state absurd situation with 100% accuracy and no cueing; patient able to explain "why" with 70% accuracy and with cueing on using more clear, precise wording she increased response acc. to 90%. EVALUATING SOLUTIONS: patient able to evaluate good and bad solutions to problems with 100% accuracy independently; patient able to state "why" solution bad/good using a single, more vague response with ~80% acc. Given verbal cueing for elaboration and expansion ("what could happen then?"), pt was able to provide a more  clear response as well as 2-3 total responses - strategies of visualization and rehearsal were utilized in order to organize her thoughts and language. Will continue with this task next session           SLP Education - 08/07/16 1443    Education provided Yes   Education Details discussed strategies to aid focusing on clear, precise responses and to elaborate more in the response; visualization and rehearsal techniques   Person(s) Educated Patient   Methods Explanation;Demonstration;Verbal cues   Comprehension Verbalized understanding;Returned demonstration;Verbal cues required            SLP Long Term Goals - 06/23/16 1542      SLP LONG TERM GOAL #1   Title Patient will demonstrate functional cognitive-communication skills for independent completion of personal responsibilities.   Time 8   Period Weeks   Status Partially Met     SLP LONG TERM GOAL #2   Title Patient will independently complete complex executive function skills tasks with 80% accuracy.   Time 8   Period Weeks   Status Partially Met     SLP LONG TERM GOAL #3   Title Patient will complete complex attention tasks with 80% accuracy.   Time 8   Period Weeks   Status Partially Met     SLP LONG TERM GOAL #4   Title Patient will complete memory strategy activities with 80% accuracy.   Time 8   Period Weeks  Status Partially Met     SLP LONG TERM GOAL #5   Title Patient will generate grammatical and cogent sentences to complete abstract/complex linguistic tasks with 80% accuracy   Time 8   Period Weeks   Status Partially Met          Plan - 08/07/16 1456    Clinical Impression Statement The patient demonstrated improved scanning for, and interpreting of, information in a complex setting to form a response and overall improved perseverance with clarifying her verbal and written responses.  The patient is demonstrating a few behaviors that interfere with clarifying and expanding on her  responses/language which include impulsive responses and reduced flexibility along with reduced access to common knowledge.  The patient is taking feedback well regarding her responses(and any corrections) and has improved in her ability to modify her responses given cues.  Pt completed verbal and written tasks of explaining Homonyms and correcting Verbal Absurdities with ~70% acc.; Identifying Problem Solutions w/ clear statements/responses with ~80% acc.  Will continue to assess/address executive skills necessary for independent living during treatment.   Speech Therapy Frequency 2x / week   Duration Other (comment)   Treatment/Interventions Compensatory techniques;Cognitive reorganization;Internal/external aids;Functional tasks;SLP instruction and feedback;Compensatory strategies;Patient/family education   Potential to Achieve Goals Good   Potential Considerations Ability to learn/carryover information;Cooperation/participation level;Previous level of function;Severity of impairments;Family/community support   SLP Home Exercise Plan HEP exercises to include evaluation solutions; answering what you need to know in a situation; providing explanation   Consulted and Agree with Plan of Care Patient      Patient will benefit from skilled therapeutic intervention in order to improve the following deficits and impairments:   Cognitive communication deficit    Problem List There are no active problems to display for this patient.   Orinda Kenner, MS, CCC-SLP Autie Vasudevan 08/07/2016, 3:11 PM  [place] MAIN [facility] SERVICES [address] [place], [place], [address] Phone: [phone]   Fax:  [phone]   Name: NICOLASA MILBRATH MRN: [id number] Date of Birth: [birth year]

## 2016-08-13 ENCOUNTER — Ambulatory Visit: Payer: Commercial Managed Care - HMO | Admitting: Occupational Therapy

## 2016-08-13 ENCOUNTER — Ambulatory Visit: Payer: Commercial Managed Care - HMO | Admitting: Speech Pathology

## 2016-08-13 ENCOUNTER — Ambulatory Visit: Payer: Commercial Managed Care - HMO | Admitting: Physical Therapy

## 2016-08-13 ENCOUNTER — Encounter: Payer: Self-pay | Admitting: Physical Therapy

## 2016-08-13 DIAGNOSIS — R278 Other lack of coordination: Secondary | ICD-10-CM

## 2016-08-13 DIAGNOSIS — R41841 Cognitive communication deficit: Secondary | ICD-10-CM

## 2016-08-13 DIAGNOSIS — M6281 Muscle weakness (generalized): Secondary | ICD-10-CM | POA: Diagnosis not present

## 2016-08-13 DIAGNOSIS — R29810 Facial weakness: Secondary | ICD-10-CM

## 2016-08-13 DIAGNOSIS — R262 Difficulty in walking, not elsewhere classified: Secondary | ICD-10-CM

## 2016-08-13 NOTE — Patient Instructions (Signed)
OT TREATMENT     Neuro muscular re-education:  Therapeutic Exercise:  Selfcare:  Pt. worked on IADL functional tasks. Pt. worked on navigating schedules with verbal cues, and assistance, as well and IADL precooking tasks with deciphering measurements. Pt. worked on Probation officer, and legibly within a given space.  Manual Therapy:
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
## 2016-08-13 NOTE — Therapy (Signed)
[facility] MAIN [facility] SERVICES [address] [place], [place], [address] Phone: [phone]   Fax:  [phone]  Physical Therapy Treatment  Patient Details  Name: Natasha [name] MRN: [id number] Date of Birth: Dec 15, 1985 Referring Provider: Orene Desanctis  Encounter Date: 08/13/2016      PT End of Session - 08/13/16 1330    Visit Number 40   Number of Visits 49   Date for PT Re-Evaluation 09/10/16   Authorization Type 34 visits beginning 05/01/16    PT Start Time 0102   PT Stop Time 0144   PT Time Calculation (min) 42 min   Equipment Utilized During Treatment Gait belt   Activity Tolerance Patient tolerated treatment well;Patient limited by fatigue   Behavior During Therapy [facility] for tasks assessed/performed      Past Medical History:  Diagnosis Date  . Polysubstance abuse   . Restless leg syndrome unk    Past Surgical History:  Procedure Laterality Date  . CHOLECYSTECTOMY    . TUBAL LIGATION      There were no vitals filed for this visit.      Subjective Assessment - 08/13/16 1329    Subjective Pt reports she is feeling good. No new concerns . She is able to ambulate without loftstrand crutch indoors and uses one crutch outdoors.   Pertinent History TBI   Patient Stated Goals to be able to walk without the loftstrand crutches   Currently in Pain? No/denies   Pain Score 0-No pain   Pain Onset In the past 7 days   Multiple Pain Sites No      Gait training : Instruction without AD on uneven surfaces, inclines, outside surfaces, through narrow passages and around obstacles x greater than 5,000 feet without loss of balance.  Neuromuscular training; Stepping over 1/2 foam x 20 fwd/ bwd Side stepping on blue balance foam x 10 feet x 10 with fatigue Tapping from foam to 6 inch stool x 20 BLE CGA and Min to mod verbal cues used throughout with increased in postural sway and LOB most seen with narrow base of support and while  on uneven surfaces. Continues to have balance deficits typical with diagnosis. Patient performs intermediate level exercises without pain behaviors and needs verbal cuing for postural alignment and head positioning                            PT Education - 08/13/16 1330    Education provided Yes   Education Details alternating stepping on stairs   Person(s) Educated Patient   Methods Explanation   Comprehension Verbalized understanding             PT Long Term Goals - 07/15/16 1459      PT LONG TERM GOAL #1   Title Patient will reduce timed up and go to <11 seconds to reduce fall risk and demonstrate improved transfer/gait ability.   Baseline 11.05   Time 12   Period Weeks   Status Achieved     PT LONG TERM GOAL #2   Title Patient will increase six minute walk test distance to >1000 for progression to community ambulator and improve gait ability   Baseline 1000 feet   Time 12   Period Weeks   Status Achieved     PT LONG TERM GOAL #3   Title Patient will increase 10 meter walk test to >1.48m/s as to improve gait speed for better community  ambulation and to reduce fall    Baseline .91 m/sec   Time 12   Period Weeks   Status On-going     PT LONG TERM GOAL #4   Title Patient will tolerate 5 seconds of single leg stance without loss of balance to improve ability to get in and out of shower safely   Baseline 2 seconds   Time 12   Period Weeks   Status On-going     PT LONG TERM GOAL #5   Title Patient (< 56 years old) will complete five times sit to stand test in < 10 seconds indicating an increased LE strength and improved balance   Baseline see note   Time 12   Period Weeks   Status On-going     Additional Long Term Goals   Additional Long Term Goals Yes               Plan - 08/13/16 1332    Clinical Impression Statement Patient instructed in gait training outdoors and on uneven surfaces and inclines without assistive device. She has  no loss of balance and was encouraged to practice ambulating wihtout assistive device. She needs to practice getting up from the floor and will continue to benefit from skilled PT to improve strength and balance and safety.   Rehab Potential Good   PT Frequency 2x / week   PT Duration 12 weeks   PT Treatment/Interventions Therapeutic activities;Therapeutic exercise;Balance training;Neuromuscular re-education;Stair training;Gait training;Patient/family education   PT Next Visit Plan strengthening and balance training; gait training   PT Home Exercise Plan As prescribed   Consulted and Agree with Plan of Care Patient      Patient will benefit from skilled therapeutic intervention in order to improve the following deficits and impairments:  Abnormal gait, Decreased balance, Decreased endurance, Difficulty walking, Decreased cognition, Decreased safety awareness, Decreased strength, Impaired UE functional use, Obesity  Visit Diagnosis: Muscle weakness (generalized)  Other lack of coordination  Difficulty in walking, not elsewhere classified  Generalized weakness     Problem List There are no active problems to display for this patient.  [name], PT, DPT [place], [place] 08/13/2016, 1:37 PM  [facility] MAIN [facility] SERVICES [address] [place], [place], [address] Phone: [phone]   Fax:  [phone]  Name: MAGUADALUPE LATA MRN: [id number] Date of Birth: 1986-03-10

## 2016-08-14 ENCOUNTER — Encounter: Payer: Self-pay | Admitting: Speech Pathology

## 2016-08-14 NOTE — Therapy (Signed)
[place] MAIN [facility] SERVICES [address] [place], [place], [address] Phone: [phone]   Fax:  [phone]  Speech Language Pathology Treatment  Patient Details  Name: Natasha [name] MRN: [id number] Date of Birth: 1985-12-10 Referring Provider: Barbaraann Boys  Encounter Date: 08/13/2016      End of Session - 08/14/16 1247    Visit Number 32   Number of Visits 37   Date for SLP Re-Evaluation 08/22/15   Authorization Type 16 of 30 SLP sessions for 2018   SLP Start Time 1400   SLP Stop Time  1500   SLP Time Calculation (min) 60 min   Activity Tolerance Patient tolerated treatment well      Past Medical History:  Diagnosis Date  . Polysubstance abuse   . Restless leg syndrome unk    Past Surgical History:  Procedure Laterality Date  . CHOLECYSTECTOMY    . TUBAL LIGATION      There were no vitals filed for this visit.      Subjective Assessment - 08/14/16 1246    Subjective The patient is eager to be independent   Currently in Pain? No/denies               ADULT SLP TREATMENT - 08/14/16 0001      General Information   Behavior/Cognition Alert;Cooperative;Pleasant mood   HPI TBI     Treatment Provided   Treatment provided Cognitive-Linquistic     Pain Assessment   Pain Assessment No/denies pain     Cognitive-Linquistic Treatment   Treatment focused on Cognition;Patient/family/caregiver education   Skilled Treatment COMPREHENSION/ABSTRACT LANAGUAGE: Patient able to generate grammatical and cogent sentences to complete simple cognitive linguistic task with minimally vague language.  Define words with multiple meanings with 50% accuracy independently and 90% given SLP cues to aid clarifying language.  State verbal absurdity with 60% accuracy independently and 100% given SLP cues to aid clarifying language.  Re-write sentences to remove verbal absurdity with 80% accuracy.      Assessment / Recommendations /  Plan   Plan Continue with current plan of care     Progression Toward Goals   Progression toward goals Progressing toward goals          SLP Education - 08/14/16 1247    Education provided Yes   Education Details Improving language specificity   Person(s) Educated Patient   Methods Explanation   Comprehension Verbalized understanding            SLP Long Term Goals - 06/23/16 1542      SLP LONG TERM GOAL #1   Title Patient will demonstrate functional cognitive-communication skills for independent completion of personal responsibilities.   Time 8   Period Weeks   Status Partially Met     SLP LONG TERM GOAL #2   Title Patient will independently complete complex executive function skills tasks with 80% accuracy.   Time 8   Period Weeks   Status Partially Met     SLP LONG TERM GOAL #3   Title Patient will complete complex attention tasks with 80% accuracy.   Time 8   Period Weeks   Status Partially Met     SLP LONG TERM GOAL #4   Title Patient will complete memory strategy activities with 80% accuracy.   Time 8   Period Weeks   Status Partially Met     SLP LONG TERM GOAL #5   Title Patient will generate grammatical and cogent sentences to  complete abstract/complex linguistic tasks with 80% accuracy   Time 8   Period Weeks   Status Partially Met          Plan - 08/14/16 1248    Clinical Impression Statement The patient demonstrated good working and short-term memory for [employer] of familiar tasks.  She demonstrated difficulty with switching sets and learning new tasks.   In general, the patient demonstrates improved scanning for information in a complex setting and improved perseverance.  The patient is demonstrating behaviors that interfere with reading comprehension and new learning.  Behaviors include: impulsive responses, mis-reading words, reduced flexibility, reduced access to common knowledge, and faulty memory for information.  The patient is  taking feedback regarding her responses and has improved in her ability to modify her response.  Will continue to assess/address executive skills necessary for independent living.   Speech Therapy Frequency 2x / week   Duration Other (comment)   Treatment/Interventions Compensatory techniques;Cognitive reorganization;Internal/external aids;Functional tasks;SLP instruction and feedback;Compensatory strategies;Patient/family education   Potential to Achieve Goals Good   Potential Considerations Ability to learn/carryover information;Cooperation/participation level;Previous level of function;Severity of impairments;Family/community support   Consulted and Agree with Plan of Care Patient      Patient will benefit from skilled therapeutic intervention in order to improve the following deficits and impairments:   Cognitive communication deficit    Problem List There are no active problems to display for this patient.  Leroy Sea, MS/CCC- SLP  Lou Miner 08/14/2016, 12:49 PM  [place] MAIN [facility] SERVICES [address] [place], [place], [address] Phone: [phone]   Fax:  [phone]   Name: Natasha [name] MRN: [id number] Date of Birth: 21-May-1985

## 2016-08-20 ENCOUNTER — Ambulatory Visit: Payer: Commercial Managed Care - HMO | Admitting: Occupational Therapy

## 2016-08-20 ENCOUNTER — Ambulatory Visit: Payer: Commercial Managed Care - HMO | Admitting: Speech Pathology

## 2016-08-27 ENCOUNTER — Ambulatory Visit: Payer: Commercial Managed Care - HMO | Attending: Pediatrics | Admitting: Occupational Therapy

## 2016-08-27 ENCOUNTER — Ambulatory Visit: Payer: Commercial Managed Care - HMO | Admitting: Speech Pathology

## 2016-08-27 ENCOUNTER — Encounter: Payer: Self-pay | Admitting: Occupational Therapy

## 2016-08-27 DIAGNOSIS — R262 Difficulty in walking, not elsewhere classified: Secondary | ICD-10-CM | POA: Diagnosis present

## 2016-08-27 DIAGNOSIS — R2681 Unsteadiness on feet: Secondary | ICD-10-CM | POA: Insufficient documentation

## 2016-08-27 DIAGNOSIS — R41841 Cognitive communication deficit: Secondary | ICD-10-CM | POA: Diagnosis present

## 2016-08-27 DIAGNOSIS — M6281 Muscle weakness (generalized): Secondary | ICD-10-CM | POA: Diagnosis present

## 2016-08-27 DIAGNOSIS — R29810 Facial weakness: Secondary | ICD-10-CM | POA: Diagnosis present

## 2016-08-27 DIAGNOSIS — R278 Other lack of coordination: Secondary | ICD-10-CM | POA: Diagnosis present

## 2016-08-27 NOTE — Therapy (Signed)
[facility] MAIN [facility] SERVICES [address] [place], [place], [address] Phone: [phone]   Fax:  [phone]  Occupational Therapy Treatment  Patient Details  Name: Natasha [name] MRN: [id number] Date of Birth: 11-20-1985 No Data Recorded  Encounter Date: 08/27/2016      OT End of Session - 08/27/16 1359    Visit Number 40   Number of Visits 48   Date for OT Re-Evaluation 09/17/16   Authorization Type 15 of 23 visits per calendar year 2018   OT Start Time 1300   OT Stop Time 1345   OT Time Calculation (min) 45 min   Activity Tolerance Patient tolerated treatment well   Behavior During Therapy [facility] for tasks assessed/performed      Past Medical History:  Diagnosis Date  . Polysubstance abuse   . Restless leg syndrome unk    Past Surgical History:  Procedure Laterality Date  . CHOLECYSTECTOMY    . TUBAL LIGATION      There were no vitals filed for this visit.                    OT Treatments/Exercises (OP) - 08/27/16 1358      Fine Motor Coordination   Other Fine Motor Exercises Pt. worked on [facility] skills grasping mini clips with her left hand  And placing them on an elevated surface.     Neurological Re-education Exercises   Other Exercises 1 Pt. performed 4# dowel ex. For UE strengthening secondary to weakness. Bilateral shoulder flexion, chest press, circular patterns, and elbow flexion/extension were performed. 3# dumbbell ex. for elbow flexion and extension, forearm supination/pronation, wrist flexion/extension, and radial deviation. Pt. requires rest breaks and verbal cues for proper technique. Pt. performed resistive EZ Board exercises for forearm supination/pronation, wrist flexion/extension using gross grasp, and lateral pinch (key) grasp. Pt. performed resistive EZ Board exercises angled in several planes to promote shoulder flexion, abduction, and wrist flexion, and extension while performing resistive  wrist flexion and extension with a gross grip.                OT Education - 08/27/16 1307    Education provided Yes   Education Details ADL, IADL, and FMC skills   Person(s) Educated Patient   Methods Explanation   Comprehension Verbalized understanding             OT Long Term Goals - 08/27/16 1308      OT LONG TERM GOAL #1   Title Patient will improve L UE strength by 1 mm grade to complete IADL tasks with modified independence.     Baseline LUE shoulder strength 4/5, elbow flexion, extension 4+/5, wrist 4=/5   Time 12   Period Weeks   Status On-going     OT LONG TERM GOAL #6   Title Pt. will improve right [facility] skills needed for preparing ingredients for meal preparation.   Baseline Pt. has difficulty opening snack packets and container lids   Time 12   Period Weeks   Status On-going     OT LONG TERM GOAL  #14   TITLE Pt. will independently perform typing with improved speed, and accuracy for basic computer use.   Baseline 3 wpm on 1 minute test, 9 wpm on the 5 min. typing test.   Time 12   Period Weeks   Status On-going     OT LONG TERM GOAL  #15   TITLE Pt. will write a 3-4  sentence paragraph efficiently with 100% accuracy, and spacing.   Baseline Pt. has difficulty   Time 12   Period Weeks   Status On-going               Plan - 08/27/16 1400    Clinical Impression Statement Pt. Reports she really wants to have a job by June, as is hoping her mother will take her to get her social security card. Pt. continues to work on improving UE functioning, strength, and coordination skills for use during ADL, and IADL tasks.     Rehab Potential Good   OT Frequency 2x / week   OT Duration 12 weeks   OT Treatment/Interventions Self-care/ADL training;Therapeutic exercise;Moist Heat;Neuromuscular education;DME and/or AE instruction;Therapeutic activities;Patient/family education;Cognitive remediation/compensation   Consulted and Agree with Plan of Care  Patient      Patient will benefit from skilled therapeutic intervention in order to improve the following deficits and impairments:  Decreased cognition, Decreased knowledge of use of DME, Decreased coordination, Decreased mobility, Decreased endurance, Decreased strength, Decreased balance, Decreased safety awareness, Decreased knowledge of precautions, Impaired UE functional use  Visit Diagnosis: Muscle weakness (generalized)  Other lack of coordination    Problem List There are no active problems to display for this patient.   Olegario Messier, MS, OTR/L 08/27/2016, 5:31 PM  [facility] MAIN [facility] SERVICES [address] [place], [place], [address] Phone: [phone]   Fax:  [phone]  Name: KARLEI WALDO MRN: [id number] Date of Birth: Jan 12, 1986

## 2016-08-27 NOTE — Patient Instructions (Addendum)
OT TREATMENT     Neuro muscular re-education:  Pt. performed 4# dowel ex. For UE strengthening secondary to weakness. Bilateral shoulder flexion, chest press, circular patterns, and elbow flexion/extension were performed. 3# dumbbell ex. for elbow flexion and extension, forearm supination/pronation, wrist flexion/extension, and radial deviation. Pt. requires rest breaks and verbal cues for proper technique. Pt. performed resistive EZ Board exercises for forearm supination/pronation, wrist flexion/extension using gross grasp, and lateral pinch (key) grasp. Pt. performed resistive EZ Board exercises angled in several planes to promote shoulder flexion, abduction, and wrist flexion, and extension while performing resistive wrist flexion and extension with a gross grip.  Pt. worked on [facility] skills grasping mini clips with her left hand  And placing them on an elevated surface.  Therapeutic Exercise:  Selfcare:  Manual Therapy:

## 2016-08-28 ENCOUNTER — Encounter: Payer: Self-pay | Admitting: Speech Pathology

## 2016-08-28 NOTE — Therapy (Signed)
[place] MAIN [facility] SERVICES [address] [place], [place], [address] Phone: [phone]   Fax:  [phone]  Speech Language Pathology Treatment  Patient Details  Name: Natasha [name] MRN: [id number] Date of Birth: Sep 26, 1985 Referring Provider: Barbaraann Boys  Encounter Date: 08/27/2016      End of Session - 08/28/16 1142    Visit Number 33   Number of Visits 37   Date for SLP Re-Evaluation 08/22/15   Authorization Type 17 of 30 SLP sessions for 2018   SLP Start Time 1402   SLP Stop Time  1500   SLP Time Calculation (min) 58 min   Activity Tolerance Patient tolerated treatment well      Past Medical History:  Diagnosis Date  . Polysubstance abuse   . Restless leg syndrome unk    Past Surgical History:  Procedure Laterality Date  . CHOLECYSTECTOMY    . TUBAL LIGATION      There were no vitals filed for this visit.      Subjective Assessment - 08/28/16 1141    Subjective The patient is eager to be independent   Currently in Pain? No/denies               ADULT SLP TREATMENT - 08/28/16 0001      General Information   Behavior/Cognition Alert;Cooperative;Pleasant mood   HPI TBI     Treatment Provided   Treatment provided Cognitive-Linquistic     Pain Assessment   Pain Assessment No/denies pain     Cognitive-Linquistic Treatment   Treatment focused on Cognition;Patient/family/caregiver education   Skilled Treatment COMPREHENSION/ABSTRACT LANAGUAGE: Patient able to generate grammatical and cogent sentences to complete simple cognitive linguistic task with minimally vague language.  Define words with multiple meanings with 50% accuracy independently and 90% given SLP cues to aid clarifying language.  State problem given solution with 60% accuracy independently and 100% given SLP cues to aid clarifying language.  Complete Perplexor (level C) given min SLP cues to recall rule/strategies of task and reason  deductions from clues.     Assessment / Recommendations / Plan   Plan Continue with current plan of care     Progression Toward Goals   Progression toward goals Progressing toward goals          SLP Education - 08/28/16 1142    Education provided Yes   Education Details Importance of language specificity to express her competence   Person(s) Educated Patient   Methods Explanation   Comprehension Verbalized understanding            SLP Long Term Goals - 06/23/16 1542      SLP LONG TERM GOAL #1   Title Patient will demonstrate functional cognitive-communication skills for independent completion of personal responsibilities.   Time 8   Period Weeks   Status Partially Met     SLP LONG TERM GOAL #2   Title Patient will independently complete complex executive function skills tasks with 80% accuracy.   Time 8   Period Weeks   Status Partially Met     SLP LONG TERM GOAL #3   Title Patient will complete complex attention tasks with 80% accuracy.   Time 8   Period Weeks   Status Partially Met     SLP LONG TERM GOAL #4   Title Patient will complete memory strategy activities with 80% accuracy.   Time 8   Period Weeks   Status Partially Met     SLP LONG  TERM GOAL #5   Title Patient will generate grammatical and cogent sentences to complete abstract/complex linguistic tasks with 80% accuracy   Time 8   Period Weeks   Status Partially Met          Plan - 08/28/16 1143    Clinical Impression Statement The patient demonstrated good working and short-term memory for [employer] of familiar tasks.  She demonstrated difficulty with switching sets and learning new tasks.   In general, the patient demonstrates improved scanning for information in a complex setting and improved perseverance.  The patient is demonstrating behaviors that interfere with reading comprehension and new learning.  Behaviors include: impulsive responses, mis-reading words, reduced  flexibility, reduced access to common knowledge, and faulty memory for information.  The patient is taking feedback regarding her responses and has improved in her ability to modify her response.  Will continue to assess/address executive skills necessary for independent living.   Speech Therapy Frequency 2x / week   Duration Other (comment)   Treatment/Interventions Compensatory techniques;Cognitive reorganization;Internal/external aids;Functional tasks;SLP instruction and feedback;Compensatory strategies;Patient/family education   Potential to Achieve Goals Good   Potential Considerations Ability to learn/carryover information;Cooperation/participation level;Previous level of function;Severity of impairments;Family/community support   SLP Home Exercise Plan Perplexor logic problems   Consulted and Agree with Plan of Care Patient      Patient will benefit from skilled therapeutic intervention in order to improve the following deficits and impairments:   Cognitive communication deficit    Problem List There are no active problems to display for this patient.  Natasha [name], [place], Natasha [name] 08/28/2016, 11:47 AM  [place] MAIN [facility] SERVICES [address] [place], [place], [address] Phone: [phone]   Fax:  [phone]   Name: Natasha [name] MRN: [id number] Date of Birth: 1985-08-27

## 2016-09-03 ENCOUNTER — Ambulatory Visit: Payer: Commercial Managed Care - HMO | Admitting: Speech Pathology

## 2016-09-03 ENCOUNTER — Ambulatory Visit: Payer: Commercial Managed Care - HMO | Admitting: Occupational Therapy

## 2016-09-03 ENCOUNTER — Ambulatory Visit: Payer: Commercial Managed Care - HMO | Admitting: Physical Therapy

## 2016-09-03 ENCOUNTER — Encounter: Payer: Self-pay | Admitting: Physical Therapy

## 2016-09-03 VITALS — BP 117/79

## 2016-09-03 DIAGNOSIS — M6281 Muscle weakness (generalized): Secondary | ICD-10-CM | POA: Diagnosis not present

## 2016-09-03 DIAGNOSIS — R41841 Cognitive communication deficit: Secondary | ICD-10-CM

## 2016-09-03 DIAGNOSIS — R29810 Facial weakness: Secondary | ICD-10-CM

## 2016-09-03 DIAGNOSIS — R262 Difficulty in walking, not elsewhere classified: Secondary | ICD-10-CM

## 2016-09-03 DIAGNOSIS — R278 Other lack of coordination: Secondary | ICD-10-CM
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
## 2016-09-03 NOTE — Therapy (Signed)
[place] [facility] [address] [place], [place], [address] Phone: [phone]   Fax:  [phone]  Physical Therapy Treatment  Patient Details  Name: Natasha [name] MRN: [id number] Date of Birth: 04/30/1985 Referring Provider: Orene DesanctisBEHLING, KAREN  Encounter Date: 09/03/2016      PT End of Session - 09/03/16 1259    Visit Number 41   Number of Visits 49   Date for PT Re-Evaluation 09/10/16   Authorization Type 34 visits beginning 05/01/16    PT Start Time 1259   PT Stop Time 1345   PT Time Calculation (min) 46 min   Equipment Utilized During Treatment Gait belt   Activity Tolerance Patient tolerated treatment well;Patient limited by fatigue   Behavior [facility] for tasks assessed/performed      Past Medical History:  Diagnosis Date  . Polysubstance abuse   . Restless leg syndrome unk    Past Surgical History:  Procedure Laterality Date  . CHOLECYSTECTOMY    . TUBAL LIGATION      Vitals:   09/03/16 1300  BP: 117/79        Subjective Assessment - 09/03/16 1300    Subjective Pt reports she tripped over a walking stone and fell on her L knee when walking to get into van to come to therapy.  There is an abrasion on her L knee.  Provided pt with bandaid which was applied at start of session.  Pt reports this is her first fall. Today is her birthday! No other new concerns or complaints.   Pertinent History TBI   Patient Stated Goals to be able to walk without the loftstrand crutches   Currently in Pain? Yes   Pain Score 5    Pain Location Knee   Pain Orientation Left   Pain Descriptors / Indicators Discomfort   Pain Type Acute pain       Gait training:  Ambulating in hallway without AD with horizontal and vertical head turns, spontaneous cues to walk forward/backward/right/left, side stepping, sudden stops, increased gait speed  Taking step forward with R foot and raising LUE overhead with 4# dumbbell x10  and repeated with L foot and RUE x10.    Neuromuscular Re-Ed:  Rhomberg stance on airex with ball toss to L and then to R x10 each direction  Tandem stance on airex with vertical and horizontal head turns x702minutes  Ant/post and Lateral rockerboard x1 minute each direction    Therapeutic Exercise:  Lateral walking with YTB around ankles x6 lengths in // bars  Lateral stepping up and over 8" step x10 each direction  Alternating toe taps up to 8" step x20 each LE             PT Education - 09/03/16 1258    Education provided Yes   Education Details Exercise technique, gait mechanics and posture   Person(s) Educated Patient   Methods Explanation;Demonstration;Verbal cues   Comprehension Verbalized understanding;Returned demonstration;Verbal cues required;Need further instruction             PT Long Term Goals - 07/15/16 1459      PT LONG TERM GOAL #1   Title Patient will reduce timed up and go to <11 seconds to reduce fall risk and demonstrate improved transfer/gait ability.   Baseline 11.05   Time 12   Period Weeks   Status Achieved     PT LONG TERM GOAL #2   Title Patient will increase six minute walk  test distance to >1000 for progression to community ambulator and improve gait ability   Baseline 1000 feet   Time 12   Period Weeks   Status Achieved     PT LONG TERM GOAL #3   Title Patient will increase 10 meter walk test to >1.54m/s as to improve gait speed for better community ambulation and to reduce fall    Baseline .91 m/sec   Time 12   Period Weeks   Status On-going     PT LONG TERM GOAL #4   Title Patient will tolerate 5 seconds of single leg stance without loss of balance to improve ability to get in and out of shower safely   Baseline 2 seconds   Time 12   Period Weeks   Status On-going     PT LONG TERM GOAL #5   Title Patient (< 41 years old) will complete five times sit to stand test in < 10 seconds indicating an increased LE strength and  improved balance   Baseline see note   Time 12   Period Weeks   Status On-going     Additional Long Term Goals   Additional Long Term Goals Yes               Plan - 09/03/16 1318    Clinical Impression Statement Instructed pt to monitor abrasion on L knee and to notify her MD if it does not heal properly.  She demonstrates instability with higher level balance activities when ambulating.  She demonstrates instability with multi tasking balance exercises but tolerated all interventions well.  Pt will benefit from continued skilled PT interventions for improved strength, balance, gait mechanics, and QOL.   Rehab Potential Good   PT Frequency 2x / week   PT Duration 12 weeks   PT Treatment/Interventions Therapeutic activities;Therapeutic exercise;Balance training;Neuromuscular re-education;Stair training;Gait training;Patient/family education   PT Next Visit Plan strengthening and balance training; gait training   PT Home Exercise Plan As prescribed   Consulted and Agree with Plan of Care Patient      Patient will benefit from skilled therapeutic intervention in order to improve the following deficits and impairments:  Abnormal gait, Decreased balance, Decreased endurance, Difficulty walking, Decreased cognition, Decreased safety awareness, Decreased strength, Impaired UE functional use, Obesity  Visit Diagnosis: Muscle weakness (generalized)  Other lack of coordination  Difficulty in walking, not elsewhere classified  Generalized weakness     Problem List There are no active problems to display for this patient.   Encarnacion Chu PT, DPT 09/03/2016, 2:02 PM  [place] [facility] MAIN [facility] SERVICES [address] [place], [place], [address] Phone: [phone]   Fax:  [phone]  Name: Natasha [name] MRN: [id number] Date of Birth: 1986-03-11

## 2016-09-03 NOTE — Patient Instructions (Signed)
OT TREATMENT     Neuro muscular re-education:  Pt. was administered one trial of The [employer] Pegboard. Scores are as follows: Right hand: 6, Left hand: 9, Both hands: 5, R/L/B: 20, Assembly: 12  Therapeutic Exercise:  Selfcare:  Pt. Was administered the COPM for self-care, and IADL functioning.Pt. Top 5 Occupational performance areas indicated include: word puzzles/searches, playing group cards, and games, being able to visit family and friends, working, and volunteering. Performance score: 4.4, satisfaction score: 3.4   Manual Therapy:

## 2016-09-04 ENCOUNTER — Encounter: Payer: Self-pay | Admitting: Speech Pathology

## 2016-09-04 NOTE — Therapy (Signed)
[place] MAIN [facility] SERVICES [address] [place], [place], [address] Phone: [phone]   Fax:  [phone]  Speech Language Pathology Treatment  Patient Details  Name: Natasha [name] MRN: [id number] Date of Birth: 07-30-1985 Referring Provider: Barbaraann Boys  Encounter Date: 09/03/2016      End of Session - 09/04/16 1133    Visit Number 34   Number of Visits 37   Date for SLP Re-Evaluation 08/22/15   Authorization Type 18 of 30 SLP sessions for 2018   SLP Start Time 1400   SLP Stop Time  1454   SLP Time Calculation (min) 54 min   Activity Tolerance Patient tolerated treatment well      Past Medical History:  Diagnosis Date  . Polysubstance abuse   . Restless leg syndrome unk    Past Surgical History:  Procedure Laterality Date  . CHOLECYSTECTOMY    . TUBAL LIGATION      There were no vitals filed for this visit.      Subjective Assessment - 09/04/16 1132    Subjective The patient is eager to be independent   Currently in Pain? No/denies               ADULT SLP TREATMENT - 09/04/16 0001      General Information   Behavior/Cognition Alert;Cooperative;Pleasant mood   HPI TBI     Treatment Provided   Treatment provided Cognitive-Linquistic     Pain Assessment   Pain Assessment No/denies pain     Cognitive-Linquistic Treatment   Treatment focused on Cognition;Patient/family/caregiver education   Skilled Treatment COMPREHENSION/ABSTRACT LANAGUAGE: Patient able to generate grammatical and cogent sentences to complete simple cognitive linguistic task with minimally vague language.  Define words with multiple meanings with 75% accuracy independently and 90% given SLP cues to aid clarifying language.  State problem given solution with 60% accuracy independently and 100% given SLP cues to aid clarifying language.  Complete Perplexor (level C) given min SLP cues to recall rules/strategies of task and reason  deductions from clues.     Assessment / Recommendations / Plan   Plan Continue with current plan of care     Progression Toward Goals   Progression toward goals Progressing toward goals          SLP Education - 09/04/16 1132    Education provided Yes   Education Details Clarity in language will help her to met her personal goals   Person(s) Educated Patient   Methods Explanation   Comprehension Verbalized understanding            SLP Long Term Goals - 06/23/16 1542      SLP LONG TERM GOAL #1   Title Patient will demonstrate functional cognitive-communication skills for independent completion of personal responsibilities.   Time 8   Period Weeks   Status Partially Met     SLP LONG TERM GOAL #2   Title Patient will independently complete complex executive function skills tasks with 80% accuracy.   Time 8   Period Weeks   Status Partially Met     SLP LONG TERM GOAL #3   Title Patient will complete complex attention tasks with 80% accuracy.   Time 8   Period Weeks   Status Partially Met     SLP LONG TERM GOAL #4   Title Patient will complete memory strategy activities with 80% accuracy.   Time 8   Period Weeks   Status Partially Met  SLP LONG TERM GOAL #5   Title Patient will generate grammatical and cogent sentences to complete abstract/complex linguistic tasks with 80% accuracy   Time 8   Period Weeks   Status Partially Met          Plan - 09/04/16 1134    Clinical Impression Statement The patient demonstrated good working and short-term memory for [employer] of familiar tasks.  She demonstrated difficulty with switching sets and learning new tasks.   In general, the patient demonstrates improved scanning for information in a complex setting and improved perseverance.  The patient is demonstrating behaviors that interfere with reading comprehension and new learning.  Behaviors include: impulsive responses, mis-reading words, reduced  flexibility, reduced access to common knowledge, and faulty memory for information.  The patient is taking feedback regarding her responses and has improved in her ability to modify her response.  Will continue to assess/address executive skills necessary for independent living.   Speech Therapy Frequency 2x / week   Duration Other (comment)   Treatment/Interventions Compensatory techniques;Cognitive reorganization;Internal/external aids;Functional tasks;SLP instruction and feedback;Compensatory strategies;Patient/family education   Potential to Achieve Goals Good   Potential Considerations Ability to learn/carryover information;Cooperation/participation level;Previous level of function;Severity of impairments;Family/community support   SLP Home Exercise Plan Perplexor logic problems   Consulted and Agree with Plan of Care Patient      Patient will benefit from skilled therapeutic intervention in order to improve the following deficits and impairments:   Cognitive communication deficit    Problem List There are no active problems to display for this patient.  Leroy Sea, MS/CCC- SLP  Lou Miner 09/04/2016, 11:35 AM  [place] MAIN [facility] SERVICES [address] [place], [place], [address] Phone: [phone]   Fax:  [phone]   Name: Natasha [name] MRN: [id number] Date of Birth: 27-Jun-1985

## 2016-09-08 ENCOUNTER — Ambulatory Visit: Payer: Commercial Managed Care - HMO | Admitting: Physical Therapy

## 2016-09-08 ENCOUNTER — Ambulatory Visit: Payer: Commercial Managed Care - HMO | Admitting: Occupational Therapy

## 2016-09-08 ENCOUNTER — Encounter: Payer: Self-pay | Admitting: Occupational Therapy

## 2016-09-08 ENCOUNTER — Encounter: Payer: Self-pay | Admitting: Speech Pathology

## 2016-09-08 ENCOUNTER — Ambulatory Visit: Payer: Commercial Managed Care - HMO | Admitting: Speech Pathology

## 2016-09-08 ENCOUNTER — Encounter: Payer: Self-pay | Admitting: Physical Therapy

## 2016-09-08 DIAGNOSIS — M6281 Muscle weakness (generalized): Secondary | ICD-10-CM | POA: Diagnosis not present

## 2016-09-08 DIAGNOSIS — R41841 Cognitive communication deficit: Secondary | ICD-10-CM

## 2016-09-08 DIAGNOSIS — R262 Difficulty in walking, not elsewhere classified: Secondary | ICD-10-CM

## 2016-09-08 DIAGNOSIS — R278 Other lack of coordination: Secondary | ICD-10-CM

## 2016-09-08 NOTE — Addendum Note (Signed)
Addended by: [name], Cyncere Ruhe S on: 09/08/2016 05:44 PM   Modules accepted: Orders

## 2016-09-08 NOTE — Therapy (Addendum)
[place] [facility] MAIN [facility] SERVICES [address] [place], [place], [address] Phone: [phone]   Fax:  [phone]  Physical Therapy Treatment  Patient Details  Name: Natasha [name] MRN: [id number] Date of Birth: 02-18-1986 Referring Provider: Orene Desanctis  Encounter Date: 09/08/2016      PT End of Session - 09/08/16 1211    Visit Number 42   Number of Visits 50   Date for PT Re-Evaluation 12/01/16   Authorization Type 34 visits beginning 05/01/16    PT Start Time 1145   PT Stop Time 1230   PT Time Calculation (min) 45 min   Equipment Utilized During Treatment Gait belt   Activity Tolerance Patient tolerated treatment well;Patient limited by fatigue   Behavior During Therapy [facility] for tasks assessed/performed      Past Medical History:  Diagnosis Date  . Polysubstance abuse   . Restless leg syndrome unk    Past Surgical History:  Procedure Laterality Date  . CHOLECYSTECTOMY    . TUBAL LIGATION      There were no vitals filed for this visit.      Subjective Assessment - 09/08/16 1154    Subjective Patient is doing well today. She is doing more walking at home and is not using her loftstrand crutches indoors and is able to use one crutch outdoors. She needs to use a railing during ascending and descending steps.    Pertinent History TBI   Patient Stated Goals to be able to walk without the loftstrand crutches   Currently in Pain? No/denies   Pain Score 0-No pain   Pain Onset In the past 7 days   Multiple Pain Sites No         NEUROMUSCULAR RE-EDUCATION  Airex NBOS eyes open x 30 seconds  X 3 each;cues for posture and technique and CGA Airex NBOS eyes open horizontal  head turns and trunk rotation with theraball with UE in extension x 15 reps, ;cues for posture and technique and CGA Airex  cone reaching crossing midline cues for posture and technique and CGA x 15 reps lowering cones to the stool placed in front and  low. Toe tapping 6 inch stool without UE assistcues for posture and technique and CGA Tandem gait in // bars x 4 laps cues for posture and technique and CGA Side stepping on blue foam balance beam x 5 lengths of the parallel bars cues for posture and technique and CGA Step ups to 6 inch stool x 20 x 2.  Outcome measures performed with improved 6 MW test Gait training : Ambulation without assistive device and  CGA in  Level surfaces and inclines 4000 feet with head turns and distractions   Patient has wide base of support and has weight shift side to side during ambulation without AD . She needs to use UE support during balance exercises in the parallel bars.                          PT Education - 09/08/16 1156    Education provided Yes   Education Details exercise technique   Person(s) Educated Patient   Methods Explanation   Comprehension Verbalized understanding             PT Long Term Goals - 09/08/16 1157      PT LONG TERM GOAL #1   Title Patient will reduce timed up and go to <11 seconds to reduce fall risk  and demonstrate improved transfer/gait ability.   Baseline 11.30 sec   Time 12   Period Weeks   Status Achieved     PT LONG TERM GOAL #2   Title Patient will increase six minute walk test distance to >1000 for progression to community ambulator and improve gait ability   Baseline 1050 feet   Time 12   Period Weeks   Status Achieved     PT LONG TERM GOAL #3   Title Patient will increase 10 meter walk test to >1.24m/s as to improve gait speed for better community ambulation and to reduce fall    Baseline .95 m/sec   Time 12   Period Weeks   Status On-going     PT LONG TERM GOAL #4   Title Patient will tolerate 5 seconds of single leg stance without loss of balance to improve ability to get in and out of shower safely   Baseline 3 sec   Time 12   Period Weeks   Status On-going     PT LONG TERM GOAL #5   Title Patient (< 65 years  old) will complete five times sit to stand test in < 10 seconds indicating an increased LE strength and improved balance   Baseline 11 sec   Time 12   Period Weeks   Status On-going               Plan - Oct 06, 2016 1212    Clinical Impression Statement Patient presents with decreased gait speed, decreased balance fair with head turns and surface area changes, and decreased BLE strength 4/5 BLE hip abd and hip extension.  Patient's main complaint is BLE weakness and inability to participate in desired activities. Further PT examination revealed inability to tolerate single leg or tandem stance, as well as outcome measures that show the patient is at a risk for falls with decreased 6 MW test.. Patient will benefit from continued skilled PT in order to increase gait speed, increase BLE strength, and improve dynamic standing balance  to decrease risk for falls and enable patient to participate in desired activities.   Rehab Potential Good   PT Frequency 1x / week   PT Duration 12 weeks   PT Treatment/Interventions Therapeutic activities;Therapeutic exercise;Balance training;Neuromuscular re-education;Stair training;Gait training;Patient/family education   PT Next Visit Plan strengthening and balance training; gait training   PT Home Exercise Plan As prescribed   Consulted and Agree with Plan of Care Patient      Patient will benefit from skilled therapeutic intervention in order to improve the following deficits and impairments:  Abnormal gait, Decreased balance, Decreased endurance, Difficulty walking, Decreased cognition, Decreased safety awareness, Decreased strength, Impaired UE functional use, Obesity  Visit Diagnosis: Muscle weakness (generalized) - Plan: PT plan of care cert/re-cert  Other lack of coordination - Plan: PT plan of care cert/re-cert  Difficulty in walking, not elsewhere classified - Plan: PT plan of care cert/re-cert       G-Codes - October 06, 2016 10-02-06    Functional  Assessment Tool Used (Outpatient Only) TUG, 10 MW, 6 MW, 5 x sit to stand   Functional Limitation Mobility: Walking and moving around   Mobility: Walking and Moving Around Current Status [phone]) At least 40 percent but less than 60 percent impaired, limited or restricted   Mobility: Walking and Moving Around Goal Status (U0454) At least 20 percent but less than 40 percent impaired, limited or restricted      Problem List There are no active problems  to display for this patient.  Ezekiel InaKristine S Zaila Crew, PT, DPT [place], Barkley BrunsKristine S 09/08/2016, 5:44 PM  [place] [facility] [address] [place], [place], [address] Phone: [phone]   Fax:  [phone]  Name: Natasha [name] MRN: [id number] Date of Birth: 09/01/1985

## 2016-09-08 NOTE — Patient Instructions (Addendum)
OT TREATMENT     Neuro muscular re-education:  Therapeutic Exercise:  Selfcare:  Pt. worked on Therapist, artnavigating maps, and ads. Pt. was accurately able to answer questions about garden, and auto ads. Pt. required multiple verbal, and visual cues to locate areas of interest, and cities on a state map. Pt. required cues for reassurance.   Manual Therapy:

## 2016-09-08 NOTE — Therapy (Signed)
[place] MAIN [facility] SERVICES [address] [place], [place], [address] Phone: [phone]   Fax:  [phone]  Speech Language Pathology Treatment  Patient Details  Name: Natasha [name] MRN: [id number] Date of Birth: 1986/03/10 Referring Provider: Barbaraann Boys  Encounter Date: 09/08/2016      End of Session - 09/08/16 1222    Visit Number 35   Number of Visits 37   Date for SLP Re-Evaluation 08/22/15   Authorization Type 19 of 30 SLP sessions for 2018   SLP Start Time 1000   SLP Stop Time  1055   SLP Time Calculation (min) 55 min   Activity Tolerance Patient tolerated treatment well      Past Medical History:  Diagnosis Date  . Polysubstance abuse   . Restless leg syndrome unk    Past Surgical History:  Procedure Laterality Date  . CHOLECYSTECTOMY    . TUBAL LIGATION      There were no vitals filed for this visit.      Subjective Assessment - 09/08/16 1221    Subjective The patient is eager to be independent   Currently in Pain? No/denies               ADULT SLP TREATMENT - 09/08/16 0001      General Information   Behavior/Cognition Alert;Cooperative;Pleasant mood   HPI TBI     Treatment Provided   Treatment provided Cognitive-Linquistic     Pain Assessment   Pain Assessment No/denies pain     Cognitive-Linquistic Treatment   Treatment focused on Cognition;Patient/family/caregiver education   Skilled Treatment COMPREHENSION/ABSTRACT LANAGUAGE: Patient able to generate grammatical and cogent sentences to complete simple cognitive linguistic task with minimally vague language.  Define words with multiple meanings with 75% accuracy independently and 90% given SLP cues to aid clarifying language.  State items needed to complete routine tasks with 80% accuracy after SLP instruction of task.  The patient was not able to determine exact task requirement from reading instructions independently.  Complete  Perplexor (level C) given min SLP cues to recall rules/strategies of task and reason deductions from clues.     Assessment / Recommendations / Plan   Plan Continue with current plan of care     Progression Toward Goals   Progression toward goals Progressing toward goals          SLP Education - 09/08/16 1221    Education provided Yes   Education Details reading accuracy   Person(s) Educated Patient   Methods Explanation   Comprehension Verbalized understanding            SLP Long Term Goals - 06/23/16 1542      SLP LONG TERM GOAL #1   Title Patient will demonstrate functional cognitive-communication skills for independent completion of personal responsibilities.   Time 8   Period Weeks   Status Partially Met     SLP LONG TERM GOAL #2   Title Patient will independently complete complex executive function skills tasks with 80% accuracy.   Time 8   Period Weeks   Status Partially Met     SLP LONG TERM GOAL #3   Title Patient will complete complex attention tasks with 80% accuracy.   Time 8   Period Weeks   Status Partially Met     SLP LONG TERM GOAL #4   Title Patient will complete memory strategy activities with 80% accuracy.   Time 8   Period Weeks   Status Partially  Met     SLP LONG TERM GOAL #5   Title Patient will generate grammatical and cogent sentences to complete abstract/complex linguistic tasks with 80% accuracy   Time 8   Period Weeks   Status Partially Met          Plan - 09/08/16 1223    Clinical Impression Statement The patient demonstrated good working and short-term memory for [employer] of familiar tasks.  She demonstrated difficulty with switching sets and learning new tasks.   In general, the patient demonstrates improved scanning for information in a complex setting and improved perseverance.  The patient is demonstrating behaviors that interfere with reading comprehension and new learning.  Behaviors include: impulsive  responses, mis-reading words, reduced flexibility, reduced access to common knowledge, and faulty memory for information.  The patient is taking feedback regarding her responses and has improved in her ability to modify her response.  Will continue to assess/address executive skills necessary for independent living.   Speech Therapy Frequency 2x / week   Duration Other (comment)   Treatment/Interventions Compensatory techniques;Cognitive reorganization;Internal/external aids;Functional tasks;SLP instruction and feedback;Compensatory strategies;Patient/family education   Potential to Achieve Goals Good   Potential Considerations Ability to learn/carryover information;Cooperation/participation level;Previous level of function;Severity of impairments;Family/community support   SLP Home Exercise Plan Perplexor logic problems   Consulted and Agree with Plan of Care Patient      Patient will benefit from skilled therapeutic intervention in order to improve the following deficits and impairments:   Cognitive communication deficit    Problem List There are no active problems to display for this patient.  Leroy Sea, MS/CCC- SLP  Lou Miner 09/08/2016, 12:25 PM  [place] MAIN [facility] SERVICES [address] [place], [place], [address] Phone: [phone]   Fax:  [phone]   Name: NALAYA WOJDYLA MRN: [id number] Date of Birth: 19-Apr-1986
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
## 2016-09-10 ENCOUNTER — Encounter: Payer: 59 | Admitting: Speech Pathology

## 2016-09-10 ENCOUNTER — Encounter: Payer: 59 | Admitting: Occupational Therapy

## 2016-09-11 ENCOUNTER — Ambulatory Visit: Payer: Commercial Managed Care - HMO | Admitting: Speech Pathology

## 2016-09-11 ENCOUNTER — Encounter: Payer: 59 | Admitting: Speech Pathology

## 2016-09-11 ENCOUNTER — Encounter: Payer: Self-pay | Admitting: Speech Pathology

## 2016-09-11 ENCOUNTER — Encounter: Payer: 59 | Admitting: Occupational Therapy

## 2016-09-11 DIAGNOSIS — M6281 Muscle weakness (generalized): Secondary | ICD-10-CM | POA: Diagnosis not present

## 2016-09-11 DIAGNOSIS — R41841 Cognitive communication deficit: Secondary | ICD-10-CM

## 2016-09-11 NOTE — Therapy (Signed)
[place] MAIN [facility] SERVICES [address] [place], [place], [address] Phone: [phone]   Fax:  [phone]  Speech Language Pathology Treatment  Patient Details  Name: Natasha [name] MRN: [id number] Date of Birth: 1985/10/12 Referring Provider: Barbaraann Boys  Encounter Date: 09/11/2016      End of Session - 09/11/16 1251    Visit Number 36   Number of Visits 37   Date for SLP Re-Evaluation 09/11/16   Authorization Type 20 of 30 SLP sessions for 2018   SLP Start Time 1050   SLP Stop Time  1145   SLP Time Calculation (min) 55 min   Activity Tolerance Patient tolerated treatment well      Past Medical History:  Diagnosis Date  . Polysubstance abuse   . Restless leg syndrome unk    Past Surgical History:  Procedure Laterality Date  . CHOLECYSTECTOMY    . TUBAL LIGATION      There were no vitals filed for this visit.      Subjective Assessment - 09/11/16 1251    Subjective The patient is eager to be independent   Currently in Pain? No/denies               ADULT SLP TREATMENT - 09/11/16 0001      General Information   Behavior/Cognition Alert;Cooperative;Pleasant mood   HPI TBI     Treatment Provided   Treatment provided Cognitive-Linquistic     Pain Assessment   Pain Assessment No/denies pain     Cognitive-Linquistic Treatment   Treatment focused on Cognition;Patient/family/caregiver education   Skilled Treatment COMPREHENSION/ABSTRACT LANAGUAGE: Patient able to generate grammatical and cogent sentences to complete simple cognitive linguistic task with minimally vague language.  Define words with multiple meanings with 75% accuracy independently and 90% given SLP cues to aid clarifying language.  State missing step in a sequence with 80% accuracy given min SLP cues to improve specificity.  Complete simple organizing information worksheet with 90% accuracy given min SLP cues.  Complete Perplexor (level C)  given min SLP cues to recall rules/strategies of task and reason deductions from clues.     Assessment / Recommendations / Plan   Plan Continue with current plan of care     Progression Toward Goals   Progression toward goals Progressing toward goals          SLP Education - 09/11/16 1251    Education provided Yes   Education Details self-monitoring   Person(s) Educated Patient   Methods Explanation   Comprehension Verbalized understanding            SLP Long Term Goals - 06/23/16 1542      SLP LONG TERM GOAL #1   Title Patient will demonstrate functional cognitive-communication skills for independent completion of personal responsibilities.   Time 8   Period Weeks   Status Partially Met     SLP LONG TERM GOAL #2   Title Patient will independently complete complex executive function skills tasks with 80% accuracy.   Time 8   Period Weeks   Status Partially Met     SLP LONG TERM GOAL #3   Title Patient will complete complex attention tasks with 80% accuracy.   Time 8   Period Weeks   Status Partially Met     SLP LONG TERM GOAL #4   Title Patient will complete memory strategy activities with 80% accuracy.   Time 8   Period Weeks   Status Partially Met  SLP LONG TERM GOAL #5   Title Patient will generate grammatical and cogent sentences to complete abstract/complex linguistic tasks with 80% accuracy   Time 8   Period Weeks   Status Partially Met          Plan - 09/11/16 1252    Clinical Impression Statement The patient demonstrated good working and short-term memory for [employer] of familiar tasks.  She demonstrated difficulty with switching sets and learning new tasks.   In general, the patient demonstrates improved scanning for information in a complex setting, improved perseverance, and improved self-monitoring.  The patient is demonstrating behaviors that interfere with reading comprehension and new learning.  Behaviors include: impulsive  responses, mis-reading words, reduced flexibility, reduced access to common knowledge, and faulty memory for information.  The patient is taking feedback regarding her responses and has improved in her ability to modify her response.  Will continue to assess/address executive skills necessary for independent living.   Speech Therapy Frequency 2x / week   Duration Other (comment)   Treatment/Interventions Compensatory techniques;Cognitive reorganization;Internal/external aids;Functional tasks;SLP instruction and feedback;Compensatory strategies;Patient/family education   Potential to Achieve Goals Good   Potential Considerations Ability to learn/carryover information;Cooperation/participation level;Previous level of function;Severity of impairments;Family/community support   SLP Home Exercise Plan Perplexor logic problems   Consulted and Agree with Plan of Care Patient      Patient will benefit from skilled therapeutic intervention in order to improve the following deficits and impairments:   Cognitive communication deficit    Problem List There are no active problems to display for this patient.  Leroy Sea, MS/CCC- SLP  Lou Miner 09/11/2016, 12:54 PM  [place] MAIN [facility] SERVICES [address] [place], [place], [address] Phone: [phone]   Fax:  [phone]   Name: Natasha [name] MRN: [id number] Date of Birth: 25-Feb-1986

## 2016-09-15 ENCOUNTER — Encounter: Payer: 59 | Admitting: Occupational Therapy

## 2016-09-16 ENCOUNTER — Ambulatory Visit: Payer: Commercial Managed Care - HMO | Admitting: Physical Therapy

## 2016-09-16 ENCOUNTER — Ambulatory Visit: Payer: Commercial Managed Care - HMO | Admitting: Occupational Therapy

## 2016-09-16 ENCOUNTER — Encounter: Payer: Self-pay | Admitting: Physical Therapy

## 2016-09-16 DIAGNOSIS — R278 Other lack of coordination: Secondary | ICD-10-CM

## 2016-09-16 DIAGNOSIS — M6281 Muscle weakness (generalized): Secondary | ICD-10-CM | POA: Diagnosis not present

## 2016-09-16 DIAGNOSIS — R29810 Facial weakness: Secondary | ICD-10-CM

## 2016-09-16 DIAGNOSIS — R262 Difficulty in walking, not elsewhere classified: Secondary | ICD-10-CM

## 2016-09-16 DIAGNOSIS — R2681 Unsteadiness on feet: Secondary | ICD-10-CM
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
## 2016-09-16 NOTE — Therapy (Signed)
[facility] MAIN [facility] SERVICES [address] [place], [place], [address] Phone: [phone]   Fax:  [phone]  Physical Therapy Treatment  Patient Details  Name: Natasha [name] MRN: [id number] Date of Birth: 1986-01-05 Referring Provider: Orene Desanctis  Encounter Date: 09/16/2016      PT End of Session - 09/16/16 1610    Visit Number 43   Number of Visits 50   Date for PT Re-Evaluation 12/01/16   Authorization Type 34 visits beginning 05/01/16    PT Start Time 0333   PT Stop Time 0415   PT Time Calculation (min) 42 min   Equipment Utilized During Treatment Gait belt   Activity Tolerance Patient tolerated treatment well;Patient limited by fatigue   Behavior During Therapy [facility] for tasks assessed/performed      Past Medical History:  Diagnosis Date  . Polysubstance abuse   . Restless leg syndrome unk    Past Surgical History:  Procedure Laterality Date  . CHOLECYSTECTOMY    . TUBAL LIGATION      There were no vitals filed for this visit.      Subjective Assessment - 09/16/16 1549    Subjective Patient is doing well today. She is doing more walking. No new concerns.   Pertinent History TBI   Patient Stated Goals to be able to walk without the loftstrand crutches   Currently in Pain? No/denies   Pain Score 0-No pain   Pain Onset In the past 7 days   Multiple Pain Sites No      NMR Matrix stepping forwards and backwards x 512.5 lbs; patient requiredrest breaksdue to fatigue  Matrix stepping sideways x312.5 lbs Blue balance beam side stepping without UE support  Blue foamto 6 inch stool 20x2, side stepping left and right  Blue foam toe taps to 6 inch stool 20x2 Rocker board fwd/bwd 20x2 Blue foam side stepping x5 laps   Patient required CGA during matrix stepping and all dynamic balance activities. Patient required UE support during side stepping on blue foam.                            PT Education - 09/16/16 1609    Education provided Yes   Education Details safety with steps using UE's   Person(s) Educated Patient   Methods Explanation   Comprehension Verbalized understanding             PT Long Term Goals - 09/08/16 1157      PT LONG TERM GOAL #1   Title Patient will reduce timed up and go to <11 seconds to reduce fall risk and demonstrate improved transfer/gait ability.   Baseline 11.30 sec   Time 12   Period Weeks   Status Achieved     PT LONG TERM GOAL #2   Title Patient will increase six minute walk test distance to >1000 for progression to community ambulator and improve gait ability   Baseline 1050 feet   Time 12   Period Weeks   Status Achieved     PT LONG TERM GOAL #3   Title Patient will increase 10 meter walk test to >1.72m/s as to improve gait speed for better community ambulation and to reduce fall    Baseline .95 m/sec   Time 12   Period Weeks   Status On-going     PT LONG TERM GOAL #4   Title Patient will tolerate 5 seconds of single  leg stance without loss of balance to improve ability to get in and out of shower safely   Baseline 3 sec   Time 12   Period Weeks   Status On-going     PT LONG TERM GOAL #5   Title Patient (< 31 years old) will complete five times sit to stand test in < 10 seconds indicating an increased LE strength and improved balance   Baseline 11 sec   Time 12   Period Weeks   Status On-going               Plan - 09/16/16 1614    Clinical Impression Statement Patient has decreased dynamic standing balance and decreased BLE coordination with wide base of support and decreased gait speed. She is able to perform advanced balance exercises with CGA for all exercises. Patient will continue to benefit from skilled PT to improve balance and falls risk .    Rehab Potential Good   PT Frequency 1x / week   PT Duration 12 weeks   PT Treatment/Interventions Therapeutic activities;Therapeutic exercise;Balance  training;Neuromuscular re-education;Stair training;Gait training;Patient/family education   PT Next Visit Plan strengthening and balance training; gait training   PT Home Exercise Plan As prescribed   Consulted and Agree with Plan of Care Patient      Patient will benefit from skilled therapeutic intervention in order to improve the following deficits and impairments:  Abnormal gait, Decreased balance, Decreased endurance, Difficulty walking, Decreased cognition, Decreased safety awareness, Decreased strength, Impaired UE functional use, Obesity  Visit Diagnosis: Muscle weakness (generalized)  Other lack of coordination  Difficulty in walking, not elsewhere classified  Generalized weakness  Unsteadiness on feet     Problem List There are no active problems to display for this patient. Ezekiel InaKristine S Coady Train, PT, DPT  [place], Barkley BrunsKristine S 09/16/2016, 5:09 PM  [facility] [address] , [place], [address] Phone: [phone]   Fax:  [phone]  Name: Natasha [name] MRN: [id number] Date of Birth: 08/13/1985

## 2016-09-17 ENCOUNTER — Encounter: Payer: 59 | Admitting: Speech Pathology

## 2016-09-18 ENCOUNTER — Ambulatory Visit: Payer: Commercial Managed Care - HMO | Admitting: Speech Pathology

## 2016-09-18 ENCOUNTER — Encounter: Payer: Self-pay | Admitting: Speech Pathology

## 2016-09-18 DIAGNOSIS — R41841 Cognitive communication deficit: Secondary | ICD-10-CM

## 2016-09-18 NOTE — Therapy (Signed)
[place] MAIN [facility] SERVICES [address] [place], [place], [address] Phone: [phone]   Fax:  [phone]  Speech Language Pathology Treatment  Patient Details  Name: Natasha [name] MRN: [id number] Date of Birth: 1985/07/22 Referring Provider: Barbaraann Boys  Encounter Date: 09/18/2016      End of Session - 09/18/16 1041    Visit Number 37   Number of Visits 37   Date for SLP Re-Evaluation 09/18/16   Authorization Type 21 of 30 SLP sessions for 2018   SLP Start Time 0854   SLP Stop Time  0950   SLP Time Calculation (min) 56 min   Activity Tolerance Patient tolerated treatment well      Past Medical History:  Diagnosis Date  . Polysubstance abuse   . Restless leg syndrome unk    Past Surgical History:  Procedure Laterality Date  . CHOLECYSTECTOMY    . TUBAL LIGATION      There were no vitals filed for this visit.      Subjective Assessment - 09/18/16 1039    Subjective The patient was pleasant and cooperative and agreeable to different therapist for this treatment session.   Currently in Pain? No/denies               ADULT SLP TREATMENT - 09/18/16 0001      General Information   Behavior/Cognition Alert;Cooperative;Pleasant mood   HPI TBI     Treatment Provided   Treatment provided Cognitive-Linquistic     Pain Assessment   Pain Assessment No/denies pain     Cognitive-Linquistic Treatment   Treatment focused on Cognition;Patient/family/caregiver education   Skilled Treatment COMPREHENSION/ABSTRACT LANAGUAGE: Patient able to generate grammatical and cogent sentences to complete simple cognitive linguistic task with minimally vague language.  Define words with multiple meanings with 70% accuracy independently and 90% given SLP cues to aid clarifying language. Pt tended to use the word differently in a sentence as opposed to defining the word.  Complete simple organizing information worksheet with 90%  accuracy given min SLP cues. Organize information into a chart w/80% acc w/mod SLP cues. Pt identified items needed to solve a problem with 87% acc independently. Pt did require SLP to clarify instructions during task to complete accurately. Complete Perplexor (level C) given min SLP cues to recall rules/strategies of task and reason deductions from clues.     Assessment / Recommendations / Plan   Plan Continue with current plan of care     Progression Toward Goals   Progression toward goals Progressing toward goals          SLP Education - 09/18/16 1040    Education provided Yes   Education Details Cognitive/organizing strategies.   Person(s) Educated Patient   Methods Explanation   Comprehension Verbalized understanding            SLP Long Term Goals - 06/23/16 1542      SLP LONG TERM GOAL #1   Title Patient will demonstrate functional cognitive-communication skills for independent completion of personal responsibilities.   Time 8   Period Weeks   Status Partially Met     SLP LONG TERM GOAL #2   Title Patient will independently complete complex executive function skills tasks with 80% accuracy.   Time 8   Period Weeks   Status Partially Met     SLP LONG TERM GOAL #3   Title Patient will complete complex attention tasks with 80% accuracy.   Time 8   Period  Weeks   Status Partially Met     SLP LONG TERM GOAL #4   Title Patient will complete memory strategy activities with 80% accuracy.   Time 8   Period Weeks   Status Partially Met     SLP LONG TERM GOAL #5   Title Patient will generate grammatical and cogent sentences to complete abstract/complex linguistic tasks with 80% accuracy   Time 8   Period Weeks   Status Partially Met          Plan - 09/18/16 1053    Clinical Impression Statement The patient demonstrated good working and short-term memory of familiar tasks.  She demonstrated difficulty with learning new tasks.   The patient continues to  demonstrate improved scanning for information in a complex setting, improved perseverance, and improved self-monitoring.  The patient is demonstrating behaviors that interfere with reading comprehension and new learning.  Behaviors include: impulsive responses, mis-reading words, reduced flexibility, reduced access to common knowledge, and faulty memory for information.  The patient is taking feedback regarding her responses and has improved in her ability to modify her response.  Will continue to assess/address executive skills necessary for independent living.   Speech Therapy Frequency 2x / week   Duration Other (comment)  8 weeks   Potential to Achieve Goals Good   Potential Considerations Ability to learn/carryover information;Cooperation/participation level;Previous level of function;Severity of impairments;Family/community support   SLP Home Exercise Plan Perplexor logic problems   Consulted and Agree with Plan of Care Patient      Patient will benefit from skilled therapeutic intervention in order to improve the following deficits and impairments:   Cognitive communication deficit    Problem List There are no active problems to display for this patient.   Coleman,Elizebeth Kluesner 09/18/2016, 11:02 AM  [place] MAIN [facility] SERVICES [address] [place], [place], [address] Phone: [phone]   Fax:  [phone]   Name: Natasha [name] MRN: [id number] Date of Birth: 03-06-1986

## 2016-09-24 ENCOUNTER — Encounter: Payer: 59 | Admitting: Speech Pathology

## 2016-09-24 ENCOUNTER — Encounter: Payer: Self-pay | Admitting: Physical Therapy

## 2016-09-24 ENCOUNTER — Ambulatory Visit: Payer: Commercial Managed Care - HMO | Admitting: Physical Therapy

## 2016-09-24 ENCOUNTER — Ambulatory Visit: Payer: Commercial Managed Care - HMO | Admitting: Occupational Therapy

## 2016-09-24 ENCOUNTER — Ambulatory Visit: Payer: Commercial Managed Care - HMO | Admitting: Speech Pathology

## 2016-09-24 DIAGNOSIS — R278 Other lack of coordination: Secondary | ICD-10-CM

## 2016-09-24 DIAGNOSIS — M6281 Muscle weakness (generalized): Secondary | ICD-10-CM

## 2016-09-24 DIAGNOSIS — R41841 Cognitive communication deficit: Secondary | ICD-10-CM

## 2016-09-24 DIAGNOSIS — R262 Difficulty in walking, not elsewhere classified: Secondary | ICD-10-CM

## 2016-09-24 NOTE — Addendum Note (Signed)
Addended by: Windell MomentABERNATHY, Vernie Piet on: 09/24/2016 01:28 PM   Modules accepted: Orders

## 2016-09-24 NOTE — Therapy (Signed)
[facility] MAIN [facility] SERVICES [address] [place], [place], [address] Phone: [phone]   Fax:  [phone]  Occupational Therapy Treatment/Recertification Report  Patient Details  Name: Natasha [name] MRN: [id number] Date of Birth: 1985/05/10 No Data Recorded  Encounter Date: 09/24/2016      OT End of Session - 09/24/16 1517    Visit Number 44   Number of Visits 48   Date for OT Re-Evaluation 12/17/16   Authorization Type 19 of 23 visits per calendar year 2018   OT Start Time 1450   OT Stop Time 1535   OT Time Calculation (min) 45 min   Activity Tolerance Patient tolerated treatment well   Behavior During Therapy [facility] for tasks assessed/performed      Past Medical History:  Diagnosis Date  . Polysubstance abuse   . Restless leg syndrome unk    Past Surgical History:  Procedure Laterality Date  . CHOLECYSTECTOMY    . TUBAL LIGATION      There were no vitals filed for this visit.      Subjective Assessment - 09/24/16 1513    Subjective  Pt. reports she went out to eat with her sister, and mother yesterday.   Patient is accompained by: Family member   Patient Stated Goals Patient reports she would like to be able to play with her kids, walk, talk, cook and be able to live independently.   Currently in Pain? No/denies      OT TREATMENT    Neuro muscular re-education:  Pt. performed [facility] skills training to improve speed and dexterity needed for ADL tasks and writing. Pt. demonstrated grasping 1 inch sticks,  inch cylindrical collars, and  inch flat washers on the Purdue pegboard. Pt. performed grasping each item with her 2nd digit and thumb, and storing them in the palm. Pt. presented with difficulty storing  inch objects at a time in the palmar aspect of the hand. Pt. worked on grasping, flipping and stacking 2" large pegs on the In[employer]o board placed at a tabletop  surface.                            OT Education - 09/24/16 1515    Education provided Yes   Education Details [facility]   Person(s) Educated Patient   Methods Explanation   Comprehension Verbalized understanding             OT Long Term Goals - 09/24/16 1535      OT LONG TERM GOAL #6   Title Pt. will improve right [facility] skills needed for preparing ingredients for meal preparation.   Baseline Pt. has difficulty opening snack packets and container lids   Time 12   Period Weeks   Status On-going     OT LONG TERM GOAL  #10   TITLE Pt. will independently calculate, manage, and simulate monthly bill management.      Baseline unable    Time 12   Period Weeks   Status On-going     OT LONG TERM GOAL  #14   TITLE Pt. will independently perform typing with improved speed, and accuracy for basic computer use.   Baseline 3 wpm on 1 minute test, 9 wpm on the 5 min. typing test.   Time 12   Period Weeks   Status On-going     OT LONG TERM GOAL  #15   TITLE Pt. will write a 3-4 sentence  paragraph efficiently with 100% accuracy, and spacing.   Baseline Pt. has difficulty   Time 12   Period Weeks   Status On-going               Plan - 09/24/16 1518    Clinical Impression Statement Pt. reports she fell at the pool over the weekend, and hurt her knees. Pt. reports she contacted the [place] Brain Injury association about the [facility] Support [facility]. Pt. navigated the Brain Injury Accociation Website. Pt. conitnues to work on improving UE strength, and coordination skills for improved ADLS, and IADLs. Pt. has 4 visits remaining this calendar year. Pt. goals were reviewed with pt.   Occupational performance deficits (Please refer to evaluation for details): ADL's   Rehab Potential Good   OT Frequency 1x / week   OT Duration 12 weeks   OT Treatment/Interventions Self-care/ADL training;Therapeutic exercise;Moist Heat;Neuromuscular education;DME and/or AE  instruction;Therapeutic activities;Patient/family education;Cognitive remediation/compensation   Consulted and Agree with Plan of Care Patient   Family Member Consulted mom      Patient will benefit from skilled therapeutic intervention in order to improve the following deficits and impairments:  Decreased cognition, Decreased knowledge of use of DME, Decreased coordination, Decreased mobility, Decreased endurance, Decreased strength, Decreased balance, Decreased safety awareness, Decreased knowledge of precautions, Impaired UE functional use  Visit Diagnosis: Muscle weakness (generalized)  Other lack of coordination    Problem List There are no active problems to display for this patient.   Olegario MessierElaine Dorthia Tout, MS, OTR/L 09/24/2016, 3:42 PM  [place] [facility] [address] [place], [place], [address] Phone: [phone]   Fax:  [phone]  Name: Hector ShadeWhitney B Chastang MRN: [id number] Date of Birth: 12/18/1985

## 2016-09-24 NOTE — Therapy (Signed)
[facility] MAIN [facility] [address] [place], [place], [address] Phone: [phone]   Fax:  [phone]  Physical Therapy Treatment  Patient Details  Name: Natasha [name] MRN: [id number] Date of Birth: 01/14/1986 Referring Provider: Orene DesanctisBEHLING, KAREN  Encounter Date: 09/24/2016      PT End of Session - 09/24/16 1400    Visit Number 44   Number of Visits 50   Date for PT Re-Evaluation 12/01/16   Authorization Type 34 visits beginning 05/01/16 ;19/23 visits   PT Start Time 0202   PT Stop Time 0245   PT Time Calculation (min) 43 min   Equipment Utilized During Treatment Gait belt   Activity Tolerance Patient tolerated treatment well;Patient limited by fatigue   Behavior During Therapy [facility] for tasks assessed/performed      Past Medical History:  Diagnosis Date  . Polysubstance abuse   . Restless leg syndrome unk    Past Surgical History:  Procedure Laterality Date  . CHOLECYSTECTOMY    . TUBAL LIGATION      There were no vitals filed for this visit.      Subjective Assessment - 09/24/16 1359    Subjective Patient is doing well today. She is doing more walking. She fell on a step getting out of a pool over the weekend.   Pertinent History TBI   Patient Stated Goals to be able to walk without the loftstrand crutches   Currently in Pain? No/denies   Pain Score 0-No pain   Pain Onset In the past 7 days   Multiple Pain Sites No      Gait: CGA without loftstrand crutch 1000 feet x 3 Ambulation x1400 ft without AD with min guard.  Head turns reading cards on wall. Increased weight shift with the additional task. Slow and unsteady gait, but no LOB. Frequent cues for heel to toe pattern.. Retro walking on outside of // bars, 4115ft x 5 laps; mod verbal cues for increased step length,   NEUROMUSCULAR RE-EDUCATION Fwd/retro tandem walk on long airex with no UE support x 12 laps; mod verbal cues to decrease UE support, decreased  step length with R throughout, mod verbal cues to increase R step length; CGA - min A for steadiness throughout Bil side stepping on long airex with no UE support x 5 laps; increased difficulty with side stepping R, CGA for steadiness  Hip abd and hip extension x 15 with RTB BLE  Cueing needed during the leg press to control motion out/back; frequent instances of decreased eccentric control were exhibited. Min cueing needed during hip 3-way to maintain upright posture with knees straight. Pt had good performance of therapeutic exercise today.                             PT Education - 09/24/16 1359    Education provided Yes   Education Details safety without AD   Person(s) Educated Patient   Methods Explanation   Comprehension Verbalized understanding             PT Long Term Goals - 09/08/16 1157      PT LONG TERM GOAL #1   Title Patient will reduce timed up and go to <11 seconds to reduce fall risk and demonstrate improved transfer/gait ability.   Baseline 11.30 sec   Time 12   Period Weeks   Status Achieved     PT LONG TERM GOAL #2  Title Patient will increase six minute walk test distance to >1000 for progression to community ambulator and improve gait ability   Baseline 1050 feet   Time 12   Period Weeks   Status Achieved     PT LONG TERM GOAL #3   Title Patient will increase 10 meter walk test to >1.3m/s as to improve gait speed for better community ambulation and to reduce fall    Baseline .95 m/sec   Time 12   Period Weeks   Status On-going     PT LONG TERM GOAL #4   Title Patient will tolerate 5 seconds of single leg stance without loss of balance to improve ability to get in and out of shower safely   Baseline 3 sec   Time 12   Period Weeks   Status On-going     PT LONG TERM GOAL #5   Title Patient (< 50 years old) will complete five times sit to stand test in < 10 seconds indicating an increased LE strength and improved balance    Baseline 11 sec   Time 12   Period Weeks   Status On-going               Plan - 09/24/16 1425    Clinical Impression Statement Patient instructed in gait training outdoors and on uneven surfaces and inclines without assistive device. She has no loss of balance and was encouraged to practice ambulating wihtout assistive device. She needs to practice getting up from the floor and will continue to benefit from skilled PT to improve strength and balance and safety.   Rehab Potential Good   PT Frequency 1x / week   PT Duration 12 weeks   PT Treatment/Interventions Therapeutic activities;Therapeutic exercise;Balance training;Neuromuscular re-education;Stair training;Gait training;Patient/family education   PT Next Visit Plan strengthening and balance training; gait training   PT Home Exercise Plan As prescribed   Consulted and Agree with Plan of Care Patient      Patient will benefit from skilled therapeutic intervention in order to improve the following deficits and impairments:  Abnormal gait, Decreased balance, Decreased endurance, Difficulty walking, Decreased cognition, Decreased safety awareness, Decreased strength, Impaired UE functional use, Obesity  Visit Diagnosis: Muscle weakness (generalized)  Other lack of coordination  Difficulty in walking, not elsewhere classified     Problem List There are no active problems to display for this patient.  [name], PT, DPT [place], Barkley Bruns S 09/24/2016, 2:43 PM  [facility] MAIN [facility] SERVICES [address] [place], [place], [address] Phone: [phone]   Fax:  [phone]  Name: Natasha [name] MRN: [id number] Date of Birth: [birth year]

## 2016-09-25 ENCOUNTER — Encounter: Payer: Self-pay | Admitting: Speech Pathology

## 2016-09-25 NOTE — Therapy (Signed)
[place] MAIN [facility] SERVICES [address] [place], [place], [address] Phone: [phone]   Fax:  [phone]  Speech Language Pathology Treatment  Patient Details  Name: Natasha [name] MRN: [id number] Date of Birth: Feb 25, 1986 Referring Provider: Barbaraann Boys  Encounter Date: 09/24/2016      End of Session - 09/25/16 0929    Visit Number 24   Number of Visits 30   Date for SLP Re-Evaluation 11/27/16   SLP Start Time 8   SLP Stop Time  1655   SLP Time Calculation (min) 55 min   Activity Tolerance Patient tolerated treatment well      Past Medical History:  Diagnosis Date  . Polysubstance abuse   . Restless leg syndrome unk    Past Surgical History:  Procedure Laterality Date  . CHOLECYSTECTOMY    . TUBAL LIGATION      There were no vitals filed for this visit.      Subjective Assessment - 09/25/16 0927    Subjective Patient arrived ready to work with some of her homework completed. She was engaged and worked hard throughout the session.   Currently in Pain? No/denies               ADULT SLP TREATMENT - 09/25/16 0001      General Information   Behavior/Cognition Alert;Cooperative;Pleasant mood   HPI TBI     Treatment Provided   Treatment provided Cognitive-Linquistic     Pain Assessment   Pain Assessment No/denies pain     Cognitive-Linquistic Treatment   Treatment focused on Cognition;Patient/family/caregiver education   Skilled Treatment COMPREHENSION/ABSTRACT LANAGUAGE: Patient was able to organize functional information into charts with minimal assistance and attention redirection from the clinician with 70% accuracy. She was able to evaluate the effectiveness of proposed solutions to hypothetical problems with 100% accuracy. Throughout the tasks, the clinician supplemented the patient with skilled cueing and scaffolding techniques when she was having word finding difficulties. The patient was able  to define words with multiple meanings with 60% accuracy independently and 80% given SLP cues to aid clarifying language.      Assessment / Recommendations / Plan   Plan Continue with current plan of care     Progression Toward Goals   Progression toward goals Progressing toward goals          SLP Education - 09/25/16 0928    Education provided Yes   Education Details Organization strategies   Person(s) Educated Patient   Methods Explanation;Demonstration   Comprehension Verbalized understanding            SLP Long Term Goals - 09/24/16 1312      SLP LONG TERM GOAL #1   Title Patient will demonstrate functional cognitive-communication skills for independent completion of personal responsibilities.   Status Partially Met     SLP LONG TERM GOAL #2   Title Patient will independently complete complex executive function skills tasks with 80% accuracy.   Status Partially Met     SLP LONG TERM GOAL #3   Title Patient will complete complex attention tasks with 80% accuracy.   Status Partially Met     SLP LONG TERM GOAL #4   Title Patient will complete memory strategy activities with 80% accuracy.   Status Partially Met     SLP LONG TERM GOAL #5   Title Patient will generate grammatical and cogent sentences to complete abstract/complex linguistic tasks with 80% accuracy   Status Partially Met  Plan - 09/25/16 0930    Clinical Impression Statement The patient's use of content words in conversation and in defining words is improving. She still requires attention redirection as well as cueing for word finding difficulties. The patient demonstrated adequate short term and working memory to complete her tasks, but is hindered by some of her behaviors, including impulsive responses, mis-reading words, reduced flexibility, and reduced access to common knowledge.   Speech Therapy Frequency 2x / week   Duration Other (comment)   Treatment/Interventions Compensatory  techniques;Cognitive reorganization;Internal/external aids;Functional tasks;SLP instruction and feedback;Compensatory strategies;Patient/family education   Potential to Achieve Goals Good   Potential Considerations Ability to learn/carryover information;Cooperation/participation level;Previous level of function;Severity of impairments;Family/community support   SLP Home Exercise Plan Perplexor logic problems   Consulted and Agree with Plan of Care Patient      Patient will benefit from skilled therapeutic intervention in order to improve the following deficits and impairments:   Cognitive communication deficit    Problem List There are no active problems to display for this patient.   Aline August 09/25/2016, 9:31 AM  [place] MAIN [facility] SERVICES [address] [place], [place], [address] Phone: [phone]   Fax:  [phone]   Name: Natasha [name] MRN: [id number] Date of Birth: Apr 03, 1986

## 2016-09-26 ENCOUNTER — Encounter: Payer: Self-pay | Admitting: Speech Pathology

## 2016-09-26 ENCOUNTER — Ambulatory Visit: Payer: Commercial Managed Care - HMO | Attending: Pediatrics | Admitting: Speech Pathology

## 2016-09-26 DIAGNOSIS — R262 Difficulty in walking, not elsewhere classified: Secondary | ICD-10-CM | POA: Insufficient documentation

## 2016-09-26 DIAGNOSIS — R2681 Unsteadiness on feet: Secondary | ICD-10-CM | POA: Diagnosis present

## 2016-09-26 DIAGNOSIS — R278 Other lack of coordination: Secondary | ICD-10-CM | POA: Diagnosis present

## 2016-09-26 DIAGNOSIS — R41841 Cognitive communication deficit: Secondary | ICD-10-CM | POA: Insufficient documentation

## 2016-09-26 DIAGNOSIS — M6281 Muscle weakness (generalized): Secondary | ICD-10-CM | POA: Insufficient documentation

## 2016-09-26 NOTE — Therapy (Signed)
[place] MAIN [facility] SERVICES [address] [place], [place], [address] Phone: [phone]   Fax:  [phone]  Speech Language Pathology Treatment  Patient Details  Name: Natasha [name] MRN: [id number] Date of Birth: December 31, 1985 Referring Provider: Barbaraann Boys  Encounter Date: 09/26/2016      End of Session - 09/26/16 1246    Visit Number 25   Number of Visits 30   Date for SLP Re-Evaluation 11/27/16   SLP Start Time 23   SLP Stop Time  1145   SLP Time Calculation (min) 55 min   Activity Tolerance Patient tolerated treatment well      Past Medical History:  Diagnosis Date  . Polysubstance abuse   . Restless leg syndrome unk    Past Surgical History:  Procedure Laterality Date  . CHOLECYSTECTOMY    . TUBAL LIGATION      There were no vitals filed for this visit.      Subjective Assessment - 09/26/16 1244    Subjective Patient forgot to bring her homework today. She worked hard and was positive during the therapy session.   Currently in Pain? No/denies               ADULT SLP TREATMENT - 09/26/16 0001      General Information   Behavior/Cognition Alert;Cooperative;Pleasant mood   HPI TBI     Treatment Provided   Treatment provided Cognitive-Linquistic     Pain Assessment   Pain Assessment No/denies pain     Cognitive-Linquistic Treatment   Treatment focused on Cognition;Patient/family/caregiver education   Skilled Treatment COMPREHENSION/ABSTRACT LANAGUAGE: Patient was able to organize and assess functional information with 100% accuracy given multiple choice questions. She was able to evaluate the effectiveness of proposed solutions to hypothetical problems with 100% accuracy. The patient was able to give three definitions of words with multiple meanings with 50% accuracy independently. She gave two definitions for the words with 90% accuracy independently. Throughout the functional tasks, the clinician  supplemented the patient with skilled cueing and scaffolding techniques when she was having word finding difficulties, as well as encouraging the patient to use more content words when she provided vague descriptions. The clinician also prompted the patient to focus on clear articulation in both conversations and reading out loud, with the patient being 90% intelligible in both areas.     Assessment / Recommendations / Plan   Plan Continue with current plan of care     Progression Toward Goals   Progression toward goals Progressing toward goals          SLP Education - 09/26/16 1245    Education provided Yes   Education Details Organization and word-finding strategies   Person(s) Educated Patient   Methods Explanation   Comprehension Verbalized understanding            SLP Long Term Goals - 09/24/16 1312      SLP LONG TERM GOAL #1   Title Patient will demonstrate functional cognitive-communication skills for independent completion of personal responsibilities.   Status Partially Met     SLP LONG TERM GOAL #2   Title Patient will independently complete complex executive function skills tasks with 80% accuracy.   Status Partially Met     SLP LONG TERM GOAL #3   Title Patient will complete complex attention tasks with 80% accuracy.   Status Partially Met     SLP LONG TERM GOAL #4   Title Patient will complete memory strategy activities  with 80% accuracy.   Status Partially Met     SLP LONG TERM GOAL #5   Title Patient will generate grammatical and cogent sentences to complete abstract/complex linguistic tasks with 80% accuracy   Status Partially Met          Plan - 09/26/16 1247    Clinical Impression Statement The patient's ability to organize functional information and her use of content words in conversation and in defining words is improving. She still requires attention redirection, cueing for word finding difficulties, and scaffolding techniques for using more  content words. The patient will continue to work on organizing functional information and other executive functioning tasks, as well as on the semantic and grammatical content of her conversations.   Speech Therapy Frequency 2x / week   Duration Other (comment)   Treatment/Interventions Compensatory techniques;Cognitive reorganization;Internal/external aids;Functional tasks;SLP instruction and feedback;Compensatory strategies;Patient/family education   Potential to Achieve Goals Good   Potential Considerations Ability to learn/carryover information;Cooperation/participation level;Previous level of function;Severity of impairments;Family/community support   SLP Home Exercise Plan Perplexor logic problems   Consulted and Agree with Plan of Care Patient      Patient will benefit from skilled therapeutic intervention in order to improve the following deficits and impairments:   Cognitive communication deficit    Problem List There are no active problems to display for this patient.   Aline August 09/26/2016, 12:48 PM  [place] MAIN [facility] SERVICES [address] [place], [place], [address] Phone: [phone]   Fax:  [phone]   Name: Natasha [name] MRN: [id number] Date of Birth: [birth year]

## 2016-09-29 ENCOUNTER — Ambulatory Visit: Payer: Commercial Managed Care - HMO | Admitting: Occupational Therapy

## 2016-09-29 ENCOUNTER — Ambulatory Visit: Payer: Commercial Managed Care - HMO | Admitting: Physical Therapy

## 2016-09-29 ENCOUNTER — Ambulatory Visit: Payer: Commercial Managed Care - HMO | Admitting: Speech Pathology

## 2016-09-29 ENCOUNTER — Encounter: Payer: Self-pay | Admitting: Physical Therapy

## 2016-09-29 DIAGNOSIS — R41841 Cognitive communication deficit: Secondary | ICD-10-CM

## 2016-09-29 DIAGNOSIS — M6281 Muscle weakness (generalized): Secondary | ICD-10-CM

## 2016-09-29 DIAGNOSIS — R278 Other lack of coordination: Secondary | ICD-10-CM

## 2016-09-29 DIAGNOSIS — R262 Difficulty in walking, not elsewhere classified: Secondary | ICD-10-CM
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
## 2016-09-29 NOTE — Therapy (Signed)
[place] [facility] MAIN [facility] SERVICES [address] [place], [place], [address] Phone: [phone]   Fax:  [phone]  Physical Therapy Treatment  Patient Details  Name: Natasha [name] MRN: [id number] Date of Birth: 10-18-1985 Referring Provider: Orene Desanctis  Encounter Date: 09/29/2016      PT End of Session - 09/29/16 1648    Visit Number 45   Number of Visits 50   Date for PT Re-Evaluation 12/01/16   Authorization Type 34 visits beginning 05/01/16 ;20/23 visits   Equipment Utilized During Treatment Gait belt   Activity Tolerance Patient tolerated treatment well;Patient limited by fatigue   Behavior During Therapy [facility] for tasks assessed/performed      Past Medical History:  Diagnosis Date  . Polysubstance abuse   . Restless leg syndrome unk    Past Surgical History:  Procedure Laterality Date  . CHOLECYSTECTOMY    . TUBAL LIGATION      There were no vitals filed for this visit.      Subjective Assessment - 09/29/16 1628    Subjective Patient is doing well today. She is doing more walking.    Pertinent History TBI   Patient Stated Goals to be able to walk without the loftstrand crutches   Currently in Pain? No/denies   Pain Score 0-No pain   Pain Onset In the past 7 days   Multiple Pain Sites No      Gait training: SBA on uneven surfaces and on inclines up and down, and gravel surfaces without AD ; Patient with wide BOS and short steps and no loss of balance for 4000 feet   Neuromuscular training:    Rhomberg stance on airex with ball toss x10 to L and x10 to R  Ant/posterior rockerboard static balancing x1 minute with tendency to lose balance posteriorly with intermittent UE support  Lateral rockerboard static balancing x1 minute with intermittent UE support  Alternating toe taps up to 8" step from airex with cues for soft toe tapping for greater control.  Patient needs CGA with all above exercises with cues for posture  and technique                           PT Education - 09/29/16 1629    Education provided (P)  Yes   Person(s) Educated (P)  Patient   Methods (P)  Explanation   Comprehension (P)  Verbalized understanding             PT Long Term Goals - 09/08/16 1157      PT LONG TERM GOAL #1   Title Patient will reduce timed up and go to <11 seconds to reduce fall risk and demonstrate improved transfer/gait ability.   Baseline 11.30 sec   Time 12   Period Weeks   Status Achieved     PT LONG TERM GOAL #2   Title Patient will increase six minute walk test distance to >1000 for progression to community ambulator and improve gait ability   Baseline 1050 feet   Time 12   Period Weeks   Status Achieved     PT LONG TERM GOAL #3   Title Patient will increase 10 meter walk test to >1.40m/s as to improve gait speed for better community ambulation and to reduce fall    Baseline .95 m/sec   Time 12   Period Weeks   Status On-going     PT LONG TERM GOAL #4  Title Patient will tolerate 5 seconds of single leg stance without loss of balance to improve ability to get in and out of shower safely   Baseline 3 sec   Time 12   Period Weeks   Status On-going     PT LONG TERM GOAL #5   Title Patient (< 935 years old) will complete five times sit to stand test in < 10 seconds indicating an increased LE strength and improved balance   Baseline 11 sec   Time 12   Period Weeks   Status On-going               Plan - 09/29/16 1649    Clinical Impression Statement Patient instructed in gait training outdoors and on uneven surfaces and inclines without assistive device. She has no loss of balance and was encouraged to practice ambulating wihtout assistive device. She needs to practice getting up from the floor and will continue to benefit from skilled PT to improve strength and balance and safety.   Rehab Potential Good   PT Frequency 1x / week   PT Duration 12 weeks   PT  Treatment/Interventions Therapeutic activities;Therapeutic exercise;Balance training;Neuromuscular re-education;Stair training;Gait training;Patient/family education   PT Next Visit Plan strengthening and balance training; gait training   PT Home Exercise Plan As prescribed   Consulted and Agree with Plan of Care Patient      Patient will benefit from skilled therapeutic intervention in order to improve the following deficits and impairments:  Abnormal gait, Decreased balance, Decreased endurance, Difficulty walking, Decreased cognition, Decreased safety awareness, Decreased strength, Impaired UE functional use, Obesity  Visit Diagnosis: Muscle weakness (generalized)  Other lack of coordination  Cognitive communication deficit  Difficulty in walking, not elsewhere classified     Problem List There are no active problems to display for this patient.  Ezekiel InaKristine S Yeraldi Fidler, PT, DPT [place], Barkley BrunsKristine S 09/29/2016, 4:51 PM  [place] [facility] [address] [place], [place], [address] Phone: [phone]   Fax:  [phone]  Name: Natasha ShadeWhitney B [name] MRN: [id number] Date of Birth: 10/06/1985

## 2016-09-30 ENCOUNTER — Encounter: Payer: Self-pay | Admitting: Speech Pathology

## 2016-09-30 NOTE — Therapy (Signed)
[place] MAIN [facility] SERVICES [address] [place], [place], [address] Phone: [phone]   Fax:  [phone]  Speech Language Pathology Treatment  Patient Details  Name: DEJON JUNGMAN MRN: [id number] Date of Birth: 1985-06-02 Referring Provider: Barbaraann Boys  Encounter Date: 09/29/2016      End of Session - 09/30/16 0847    Visit Number 26   Number of Visits 30   Date for SLP Re-Evaluation 11/27/16   SLP Start Time 1500   SLP Stop Time  1600   SLP Time Calculation (min) 60 min   Activity Tolerance Patient tolerated treatment well      Past Medical History:  Diagnosis Date  . Polysubstance abuse   . Restless leg syndrome unk    Past Surgical History:  Procedure Laterality Date  . CHOLECYSTECTOMY    . TUBAL LIGATION      There were no vitals filed for this visit.      Subjective Assessment - 09/30/16 0843    Subjective Patient brought some of her completed homework in today and worked hard during the session.   Currently in Pain? No/denies               ADULT SLP TREATMENT - 09/30/16 0001      General Information   Behavior/Cognition Alert;Cooperative;Pleasant mood   HPI TBI     Treatment Provided   Treatment provided Cognitive-Linquistic     Pain Assessment   Pain Assessment No/denies pain     Cognitive-Linquistic Treatment   Treatment focused on Cognition;Patient/family/caregiver education   Skilled Treatment COMPREHENSION/ABSTRACT LANAGUAGE: Patient was able to organize and assess functional information in the form of a categorizing worksheet with 100% accuracy. She was able to cross out information that was not applicable in another functional task with 75% accuracy. Patient was able to come up with synonyms and use the words in a sentence for a list of common words with 85% accuracy. The patient required moderate attention redirection. Throughout the functional tasks, the clinician supplemented the  patient with skilled cueing and scaffolding techniques when she was having word finding difficulties, as well as encouraging the patient to use more content words when she provided vague descriptions. The patient required maximum cueing for the Perplexor game at the end of the session.     Assessment / Recommendations / Plan   Plan Continue with current plan of care     Progression Toward Goals   Progression toward goals Progressing toward goals          SLP Education - 09/30/16 0844    Education provided Yes   Education Details organizing functional information   Person(s) Educated Patient   Methods Explanation   Comprehension Verbalized understanding            SLP Long Term Goals - 09/24/16 1312      SLP LONG TERM GOAL #1   Title Patient will demonstrate functional cognitive-communication skills for independent completion of personal responsibilities.   Status Partially Met     SLP LONG TERM GOAL #2   Title Patient will independently complete complex executive function skills tasks with 80% accuracy.   Status Partially Met     SLP LONG TERM GOAL #3   Title Patient will complete complex attention tasks with 80% accuracy.   Status Partially Met     SLP LONG TERM GOAL #4   Title Patient will complete memory strategy activities with 80% accuracy.   Status Partially Met  SLP LONG TERM GOAL #5   Title Patient will generate grammatical and cogent sentences to complete abstract/complex linguistic tasks with 80% accuracy   Status Partially Met          Plan - 09/30/16 0847    Clinical Impression Statement The patient appears to be improving in her ability to focus and to generate meaningful language. The patient still requires scaffolding, skilled cueing, and attention redirection from the clinician. The clinician will continue to provide the patient with executive functioning tasks and strategies.   Speech Therapy Frequency 2x / week   Duration Other (comment)    Treatment/Interventions Compensatory techniques;Cognitive reorganization;Internal/external aids;Functional tasks;SLP instruction and feedback;Compensatory strategies;Patient/family education   Potential to Achieve Goals Good   Potential Considerations Ability to learn/carryover information;Cooperation/participation level;Previous level of function;Severity of impairments;Family/community support   SLP Home Exercise Plan Perplexor logic problems   Consulted and Agree with Plan of Care Patient      Patient will benefit from skilled therapeutic intervention in order to improve the following deficits and impairments:   Cognitive communication deficit    Problem List There are no active problems to display for this patient.   Aline August 09/30/2016, 8:48 AM  [place] MAIN [facility] SERVICES [address] [place], [place], [address] Phone: [phone]   Fax:  [phone]   Name: JULIANA BOLING MRN: [id number] Date of Birth: Jan 10, 1986

## 2016-10-01 ENCOUNTER — Encounter: Payer: 59 | Admitting: Speech Pathology

## 2016-10-02 ENCOUNTER — Encounter: Payer: 59 | Admitting: Speech Pathology

## 2016-10-03 ENCOUNTER — Encounter: Payer: Self-pay | Admitting: Speech Pathology

## 2016-10-03 ENCOUNTER — Ambulatory Visit: Payer: Commercial Managed Care - HMO | Admitting: Speech Pathology

## 2016-10-03 DIAGNOSIS — R41841 Cognitive communication deficit: Secondary | ICD-10-CM | POA: Diagnosis not present

## 2016-10-03 NOTE — Therapy (Signed)
[place] MAIN [facility] SERVICES [address] [place], [place], [address] Phone: [phone]   Fax:  [phone]  Speech Language Pathology Treatment  Patient Details  Name: Natasha [name] MRN: [id number] Date of Birth: 06-15-1985 Referring Provider: Barbaraann Boys  Encounter Date: 10/03/2016      End of Session - 10/03/16 1304    Visit Number 27   Number of Visits 30   Date for SLP Re-Evaluation 11/27/16   SLP Start Time 1100   SLP Stop Time  1155   SLP Time Calculation (min) 55 min   Activity Tolerance Patient tolerated treatment well      Past Medical History:  Diagnosis Date  . Polysubstance abuse   . Restless leg syndrome unk    Past Surgical History:  Procedure Laterality Date  . CHOLECYSTECTOMY    . TUBAL LIGATION      There were no vitals filed for this visit.      Subjective Assessment - 10/03/16 1303    Subjective Patient brought some of her completed homework in today and worked hard during the session.   Currently in Pain? No/denies               ADULT SLP TREATMENT - 10/03/16 0001      General Information   Behavior/Cognition Alert;Cooperative;Pleasant mood   HPI TBI     Treatment Provided   Treatment provided Cognitive-Linquistic     Pain Assessment   Pain Assessment No/denies pain     Cognitive-Linquistic Treatment   Treatment focused on Cognition;Patient/family/caregiver education   Skilled Treatment MEMORY:  Patient completed Lessons 1 and 2 of the [facility]- Memory program.  She accurately answered questions regarding the introduction to memory strategies and Memory Aids.  COMPREHENSION/ABSTRACT LANAGUAGE: Complete Perplexor (level C) given min SLP cues to recall rules/strategies of task and reason deductions from clues.     Assessment / Recommendations / Plan   Plan Continue with current plan of care     Progression Toward Goals   Progression toward goals Progressing toward goals           SLP Education - 10/03/16 1303    Education provided Yes   Education Details memory strategies   Person(s) Educated Patient   Methods Explanation   Comprehension Verbalized understanding            SLP Long Term Goals - 09/24/16 1312      SLP LONG TERM GOAL #1   Title Patient will demonstrate functional cognitive-communication skills for independent completion of personal responsibilities.   Status Partially Met     SLP LONG TERM GOAL #2   Title Patient will independently complete complex executive function skills tasks with 80% accuracy.   Status Partially Met     SLP LONG TERM GOAL #3   Title Patient will complete complex attention tasks with 80% accuracy.   Status Partially Met     SLP LONG TERM GOAL #4   Title Patient will complete memory strategy activities with 80% accuracy.   Status Partially Met     SLP LONG TERM GOAL #5   Title Patient will generate grammatical and cogent sentences to complete abstract/complex linguistic tasks with 80% accuracy   Status Partially Met          Plan - 10/03/16 1304    Clinical Impression Statement The patient demonstrated good working and short-term memory for [employer] of familiar tasks.  She demonstrated difficulty with switching sets and learning new tasks.  In general, the patient demonstrates improved scanning for information in a complex setting, improved perseverance, and improved self-monitoring.  The patient is demonstrating behaviors that interfere with reading comprehension and new learning.  Behaviors include: impulsive responses, mis-reading words, reduced flexibility, reduced access to common knowledge, and faulty memory for information.  The patient is taking feedback regarding her responses and has improved in her ability to modify her response.  Will continue the [place].   Speech Therapy Frequency 2x / week   Duration Other (comment)   Treatment/Interventions Compensatory  techniques;Cognitive reorganization;Internal/external aids;Functional tasks;SLP instruction and feedback;Compensatory strategies;Patient/family education   Potential to Achieve Goals Good   Potential Considerations Ability to learn/carryover information;Cooperation/participation level;Previous level of function;Severity of impairments;Family/community support   SLP Home Exercise Plan Perplexor logic problems   Consulted and Agree with Plan of Care Patient      Patient will benefit from skilled therapeutic intervention in order to improve the following deficits and impairments:   Cognitive communication deficit    Problem List There are no active problems to display for this patient.   Leroy Sea, MS/CCC- SLP  Lou Miner 10/03/2016, 1:06 PM  [place] MAIN [facility] SERVICES [address] [place], [place], [address] Phone: [phone]   Fax:  [phone]   Name: YAZLYN WENTZEL MRN: [id number] Date of Birth: 04/29/1985

## 2016-10-06 ENCOUNTER — Ambulatory Visit: Payer: Commercial Managed Care - HMO | Admitting: Speech Pathology

## 2016-10-06 ENCOUNTER — Ambulatory Visit: Payer: Commercial Managed Care - HMO | Admitting: Occupational Therapy

## 2016-10-06 ENCOUNTER — Encounter: Payer: Self-pay | Admitting: Physical Therapy

## 2016-10-06 ENCOUNTER — Ambulatory Visit: Payer: Commercial Managed Care - HMO | Admitting: Physical Therapy

## 2016-10-06 DIAGNOSIS — R278 Other lack of coordination: Secondary | ICD-10-CM

## 2016-10-06 DIAGNOSIS — R2681 Unsteadiness on feet: Secondary | ICD-10-CM

## 2016-10-06 DIAGNOSIS — M6281 Muscle weakness (generalized): Secondary | ICD-10-CM

## 2016-10-06 DIAGNOSIS — R262 Difficulty in walking, not elsewhere classified: Secondary | ICD-10-CM

## 2016-10-06 DIAGNOSIS — R41841 Cognitive communication deficit: Secondary | ICD-10-CM
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
## 2016-10-06 NOTE — Therapy (Signed)
[place] [facility] MAIN [facility] [address] [place], [place], [address] Phone: [phone]   Fax:  [phone]  Physical Therapy Treatment  Patient Details  Name: Hector ShadeWhitney B Quade MRN: [id number] Date of Birth: 01/15/1986 Referring Provider: Orene DesanctisBEHLING, KAREN  Encounter Date: 10/06/2016      PT End of Session - 10/06/16 1657    Visit Number 46   Date for PT Re-Evaluation 12/01/16   Authorization Type 34 visits beginning 05/01/16 ;21/23 visits   PT Start Time 0420   PT Stop Time 0500   PT Time Calculation (min) 40 min   Equipment Utilized During Treatment Gait belt   Activity Tolerance Patient tolerated treatment well   Behavior During Therapy [facility] for tasks assessed/performed      Past Medical History:  Diagnosis Date  . Polysubstance abuse   . Restless leg syndrome unk    Past Surgical History:  Procedure Laterality Date  . CHOLECYSTECTOMY    . TUBAL LIGATION      There were no vitals filed for this visit.      Subjective Assessment - 10/06/16 1656    Subjective Patient is doing well today. She is doing more walking and not using her loftstrnad crutches in the house and one loftstrand crutch out doors.    Pertinent History TBI   Patient Stated Goals to be able to walk without the loftstrand crutches   Currently in Pain? No/denies   Pain Score 0-No pain   Pain Onset In the past 7 days      NEUROMUSCULAR RE-EDUCATION Fwd/retro tandem walk on long airex with no UE support x 12 laps; mod verbal cues to decrease UE support, decreased step length with R throughout, mod verbal cues to increase R step length; CGA - min A for steadiness throughout Bil side stepping on long airex with no UE support x 5 laps; increased difficulty with side stepping R, CGA for steadiness Hip abd and hip extension x 15 with RTB BLE Resisted side-steeping RTB 4 lengths x 2; Standing mini squats 2 x 10 with RTB around knees to encourage abduction; Sit  to stand without UE support 2 x 10; Step-ups to 6" step x 10 bilateral; Quantum leg press 105# x 10 x 3  Cueing needed during the leg press to control motion out/back; frequent instances of decreased eccentric control were exhibited. Min cueing needed during hip 3-way to maintain upright posture with knees straight. Pt had good performance of therapeutic exercise today.                           PT Education - 10/06/16 1657    Education provided Yes   Education Details safety with steps and use of rail   Person(s) Educated Patient   Methods Explanation   Comprehension Verbalized understanding             PT Long Term Goals - 09/08/16 1157      PT LONG TERM GOAL #1   Title Patient will reduce timed up and go to <11 seconds to reduce fall risk and demonstrate improved transfer/gait ability.   Baseline 11.30 sec   Time 12   Period Weeks   Status Achieved     PT LONG TERM GOAL #2   Title Patient will increase six minute walk test distance to >1000 for progression to community ambulator and improve gait ability   Baseline 1050 feet   Time 12  Period Weeks   Status Achieved     PT LONG TERM GOAL #3   Title Patient will increase 10 meter walk test to >1.61m/s as to improve gait speed for better community ambulation and to reduce fall    Baseline .95 m/sec   Time 12   Period Weeks   Status On-going     PT LONG TERM GOAL #4   Title Patient will tolerate 5 seconds of single leg stance without loss of balance to improve ability to get in and out of shower safely   Baseline 3 sec   Time 12   Period Weeks   Status On-going     PT LONG TERM GOAL #5   Title Patient (< 17 years old) will complete five times sit to stand test in < 10 seconds indicating an increased LE strength and improved balance   Baseline 11 sec   Time 12   Period Weeks   Status On-going               Plan - 10/06/16 1702    Clinical Impression Statement Patient   demonstrates instability with higher level balance activities when ambulating. She demonstrates instability with multi tasking balance exercises but tolerated all interventions well. She performs weight shifting with single leg activities during standing balance activities. Pt will benefit from continued skilled PT interventions for improved strength, balance, gait mechanics.   Rehab Potential Good   PT Frequency 1x / week   PT Duration 12 weeks   PT Treatment/Interventions Therapeutic activities;Therapeutic exercise;Balance training;Neuromuscular re-education;Stair training;Gait training;Patient/family education   PT Next Visit Plan strengthening and balance training; gait training   PT Home Exercise Plan As prescribed   Consulted and Agree with Plan of Care Patient      Patient will benefit from skilled therapeutic intervention in order to improve the following deficits and impairments:  Abnormal gait, Decreased balance, Decreased endurance, Difficulty walking, Decreased cognition, Decreased safety awareness, Decreased strength, Impaired UE functional use, Obesity  Visit Diagnosis: Other lack of coordination  Muscle weakness (generalized)  Difficulty in walking, not elsewhere classified  Unsteadiness on feet     Problem List There are no active problems to display for this patient. [name], PT, DPT  [place], [place] 10/06/2016, 5:09 PM  [place] [facility] MAIN [facility] SERVICES [address] [place], [place], [address] Phone: [phone]   Fax:  [phone]  Name: BERLIE PERSKY MRN: [id number] Date of Birth: 1985-05-28

## 2016-10-07 ENCOUNTER — Encounter: Payer: Self-pay | Admitting: Speech Pathology

## 2016-10-07 NOTE — Therapy (Signed)
[place] MAIN [facility] SERVICES [address] [place], [place], [address] Phone: [phone]   Fax:  [phone]  Speech Language Pathology Treatment  Patient Details  Name: Natasha [name] MRN: [id number] Date of Birth: 30-Dec-1985 Referring Provider: Barbaraann Boys  Encounter Date: 10/06/2016      End of Session - 10/07/16 0830    Visit Number 28   Number of Visits 30   Date for SLP Re-Evaluation 11/27/16   SLP Start Time 1500   SLP Stop Time  1600   SLP Time Calculation (min) 60 min   Activity Tolerance Patient tolerated treatment well      Past Medical History:  Diagnosis Date  . Polysubstance abuse   . Restless leg syndrome unk    Past Surgical History:  Procedure Laterality Date  . CHOLECYSTECTOMY    . TUBAL LIGATION      There were no vitals filed for this visit.      Subjective Assessment - 10/07/16 0830    Subjective Patient worked hard and maintained a positive attitude throughout the session.   Currently in Pain? No/denies               ADULT SLP TREATMENT - 10/07/16 0001      General Information   Behavior/Cognition Alert;Cooperative;Pleasant mood   HPI TBI     Treatment Provided   Treatment provided Cognitive-Linquistic     Pain Assessment   Pain Assessment No/denies pain     Cognitive-Linquistic Treatment   Treatment focused on Cognition;Patient/family/caregiver education   Skilled Treatment MEMORY: Patient completed exercise 3 and part of 4 on memory from a cognitive strategies workbook, focusing on external memory aids. The patient was able to make lists, organize functional information, and classify information. She answered the comprehension questions with 90% accuracy and used the resources available to her with minimal cueing from the clinician. The patient also completed a Perplexor game with minimal cueing from the clinician.     Assessment / Recommendations / Plan   Plan Continue with  current plan of care     Progression Toward Goals   Progression toward goals Progressing toward goals          SLP Education - 10/07/16 0830    Education provided Yes   Education Details Using external memory aids   Person(s) Educated Patient   Methods Explanation   Comprehension Verbalized understanding            SLP Long Term Goals - 09/24/16 1312      SLP LONG TERM GOAL #1   Title Patient will demonstrate functional cognitive-communication skills for independent completion of personal responsibilities.   Status Partially Met     SLP LONG TERM GOAL #2   Title Patient will independently complete complex executive function skills tasks with 80% accuracy.   Status Partially Met     SLP LONG TERM GOAL #3   Title Patient will complete complex attention tasks with 80% accuracy.   Status Partially Met     SLP LONG TERM GOAL #4   Title Patient will complete memory strategy activities with 80% accuracy.   Status Partially Met     SLP LONG TERM GOAL #5   Title Patient will generate grammatical and cogent sentences to complete abstract/complex linguistic tasks with 80% accuracy   Status Partially Met          Plan - 10/07/16 0831    Clinical Impression Statement Patient is progressing in her ability  to remain focused throughout the session and complete her functional tasks with less cueing and scaffolding techniques from the clinician. She still occasionally requires attention redirection while performing complex tasks. Clinician will continue to use the cognitive strategies workbook and focus on internal memory aids for the next session.   Speech Therapy Frequency 2x / week   Duration Other (comment)   Treatment/Interventions Compensatory techniques;Cognitive reorganization;Internal/external aids;Functional tasks;SLP instruction and feedback;Compensatory strategies;Patient/family education   Potential to Achieve Goals Good   Potential Considerations Ability to  learn/carryover information;Cooperation/participation level;Previous level of function;Severity of impairments;Family/community support   SLP Home Exercise Plan Perplexor logic problems   Consulted and Agree with Plan of Care Patient      Patient will benefit from skilled therapeutic intervention in order to improve the following deficits and impairments:   Cognitive communication deficit    Problem List There are no active problems to display for this patient.   Aline August 10/07/2016, 8:32 AM  [place] MAIN [facility] SERVICES [address] [place], [place], [address] Phone: [phone]   Fax:  [phone]   Name: Natasha [name] MRN: [id number] Date of Birth: 20-Jan-1986

## 2016-10-13 ENCOUNTER — Encounter: Payer: Self-pay | Admitting: Physical Therapy

## 2016-10-13 ENCOUNTER — Ambulatory Visit: Payer: Commercial Managed Care - HMO | Admitting: Speech Pathology

## 2016-10-13 ENCOUNTER — Ambulatory Visit: Payer: Commercial Managed Care - HMO | Admitting: Occupational Therapy

## 2016-10-13 ENCOUNTER — Ambulatory Visit: Payer: Commercial Managed Care - HMO | Admitting: Physical Therapy

## 2016-10-13 DIAGNOSIS — R41841 Cognitive communication deficit: Secondary | ICD-10-CM | POA: Diagnosis not present

## 2016-10-13 DIAGNOSIS — M6281 Muscle weakness (generalized): Secondary | ICD-10-CM

## 2016-10-13 DIAGNOSIS — R278 Other lack of coordination: Secondary | ICD-10-CM

## 2016-10-13 DIAGNOSIS — R262 Difficulty in walking, not elsewhere classified: Secondary | ICD-10-CM

## 2016-10-13 DIAGNOSIS — R2681 Unsteadiness on feet: Secondary | ICD-10-CM
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
## 2016-10-13 NOTE — Therapy (Signed)
[place] [facility] [address] [place], [place], [address] Phone: [phone]   Fax:  [phone]  Physical Therapy Treatment  Patient Details  Name: Natasha ShadeWhitney B [name] MRN: [id number] Date of Birth: 03/21/1986 Referring Provider: Orene DesanctisBEHLING, KAREN  Encounter Date: 10/13/2016      PT End of Session - 10/13/16 1514    Visit Number 47   Date for PT Re-Evaluation 12/01/16   Authorization Type 34 visits beginning 05/01/16 ;22/23 visits   PT Start Time 0305   PT Stop Time 0345   PT Time Calculation (min) 40 min   Equipment Utilized During Treatment Gait belt   Activity Tolerance Patient tolerated treatment well   Behavior [facility] tasks assessed/performed      Past Medical History:  Diagnosis Date  . Polysubstance abuse   . Restless leg syndrome unk    Past Surgical History:  Procedure Laterality Date  . CHOLECYSTECTOMY    . TUBAL LIGATION      There were no vitals filed for this visit.      Subjective Assessment - 10/13/16 1514    Subjective Patient is doing well today. She is doing more walking and not using her loftstrnad crutches in the house and one loftstrand crutch out doors.    Pertinent History TBI   Patient Stated Goals to be able to walk without the loftstrand crutches   Currently in Pain? No/denies   Pain Score 0-No pain   Pain Onset In the past 7 days   Multiple Pain Sites No     Therapeutic exercise: Resisted side-steeping RTB 4 lengths x 2; Standing mini squats 2 x 10 with RTB around knees to encourage abduction; Sit to stand without UE support 2 x 10; Step-ups to 6" step x 10 bilateral; Quantum leg press 100# x 10, 75 lbs heel raises with cues to perform slowly   NEUROMUSCULAR RE-EDUCATION Airex NBOS eyes open x 30 seconds each; Airex NBOS eyes open horizontal and vertical head turns x 30 seconds; Airex tapping stones and  taps alternating LE x 60 seconds; Tandem gait in // bars x 4  laps, CGA   Patient needs occasional verbal cueing to improve posture and cueing to correctly perform exercises slowly                      PT Education - 10/13/16 1514    Education provided Yes   Education Details safety with steps   Person(s) Educated Patient   Methods Explanation   Comprehension Verbalized understanding             PT Long Term Goals - 09/08/16 1157      PT LONG TERM GOAL #1   Title Patient will reduce timed up and go to <11 seconds to reduce fall risk and demonstrate improved transfer/gait ability.   Baseline 11.30 sec   Time 12   Period Weeks   Status Achieved     PT LONG TERM GOAL #2   Title Patient will increase six minute walk test distance to >1000 for progression to community ambulator and improve gait ability   Baseline 1050 feet   Time 12   Period Weeks   Status Achieved     PT LONG TERM GOAL #3   Title Patient will increase 10 meter walk test to >1.1054m/s as to improve gait speed for better community ambulation and to reduce fall    Baseline .95 m/sec   Time  12   Period Weeks   Status On-going     PT LONG TERM GOAL #4   Title Patient will tolerate 5 seconds of single leg stance without loss of balance to improve ability to get in and out of shower safely   Baseline 3 sec   Time 12   Period Weeks   Status On-going     PT LONG TERM GOAL #5   Title Patient (< 3 years old) will complete five times sit to stand test in < 10 seconds indicating an increased LE strength and improved balance   Baseline 11 sec   Time 12   Period Weeks   Status On-going               Plan - 10/13/16 1515    Clinical Impression Statement Patient has decreased dynamic standing balance and decreased BLE coordination with wide base of support and decreased gait speed. She is able to perform advanced balance exercises with CGA for all exercises. Patient will continue to benefit from skilled PT to improve balance and falls risk .    Rehab  Potential Good   PT Frequency 1x / week   PT Duration 12 weeks   PT Treatment/Interventions Therapeutic activities;Therapeutic exercise;Balance training;Neuromuscular re-education;Stair training;Gait training;Patient/family education   PT Next Visit Plan strengthening and balance training; gait training   PT Home Exercise Plan As prescribed   Consulted and Agree with Plan of Care Patient      Patient will benefit from skilled therapeutic intervention in order to improve the following deficits and impairments:  Abnormal gait, Decreased balance, Decreased endurance, Difficulty walking, Decreased cognition, Decreased safety awareness, Decreased strength, Impaired UE functional use, Obesity  Visit Diagnosis: Other lack of coordination  Difficulty in walking, not elsewhere classified  Unsteadiness on feet  Muscle weakness (generalized)     Problem List There are no active problems to display for this patient. [name], PT, DPT  [place], [place] 10/13/2016, 3:21 PM  [place] [facility] MAIN [facility] SERVICES [address] [place], [place], [address] Phone: [phone]   Fax:  [phone]  Name: Natasha [name] MRN: [id number] Date of Birth: [birth year]

## 2016-10-14 ENCOUNTER — Encounter: Payer: Self-pay | Admitting: Speech Pathology

## 2016-10-14 NOTE — Therapy (Signed)
[place] MAIN [facility] SERVICES [address] [place], [place], [address] Phone: [phone]   Fax:  [phone]  Speech Language Pathology Treatment  Patient Details  Name: Natasha [name] MRN: [id number] Date of Birth: Aug 07, 1985 Referring Provider: Barbaraann Boys  Encounter Date: 10/13/2016      End of Session - 10/14/16 0821    Visit Number 29   Number of Visits 30   Date for SLP Re-Evaluation 11/27/16   SLP Start Time 11   SLP Stop Time  1500   SLP Time Calculation (min) 60 min   Activity Tolerance Patient tolerated treatment well      Past Medical History:  Diagnosis Date  . Polysubstance abuse   . Restless leg syndrome unk    Past Surgical History:  Procedure Laterality Date  . CHOLECYSTECTOMY    . TUBAL LIGATION      There were no vitals filed for this visit.      Subjective Assessment - 10/14/16 0820    Subjective Patient was friendly and upbeat and worked hard throughout the session.   Currently in Pain? No/denies               ADULT SLP TREATMENT - 10/14/16 0001      General Information   Behavior/Cognition Alert;Cooperative;Pleasant mood   HPI TBI     Treatment Provided   Treatment provided Cognitive-Linquistic     Pain Assessment   Pain Assessment No/denies pain     Cognitive-Linquistic Treatment   Treatment focused on Cognition;Patient/family/caregiver education   Skilled Treatment MEMORY: Patient completed exercise 4 and part of 5 on memory from the [facility] workbook, focusing on external and internal memory aids. Patient learned mnemonic devices to help her remember internal memory strategies. She used the resources available to her to answer the comprehension questions with minimal cueing from the clinician. Patient did require attention redirection and cues to slow down when making choices.     Assessment / Recommendations / Plan   Plan Continue with current plan of care     Progression Toward Goals   Progression toward goals Progressing toward goals          SLP Education - 10/14/16 0821    Education provided Yes   Education Details internal memory cues   Person(s) Educated Patient   Methods Explanation   Comprehension Verbalized understanding            SLP Long Term Goals - 09/24/16 1312      SLP LONG TERM GOAL #1   Title Patient will demonstrate functional cognitive-communication skills for independent completion of personal responsibilities.   Status Partially Met     SLP LONG TERM GOAL #2   Title Patient will independently complete complex executive function skills tasks with 80% accuracy.   Status Partially Met     SLP LONG TERM GOAL #3   Title Patient will complete complex attention tasks with 80% accuracy.   Status Partially Met     SLP LONG TERM GOAL #4   Title Patient will complete memory strategy activities with 80% accuracy.   Status Partially Met     SLP LONG TERM GOAL #5   Title Patient will generate grammatical and cogent sentences to complete abstract/complex linguistic tasks with 80% accuracy   Status Partially Met          Plan - 10/14/16 3491    Clinical Impression Statement Patient is requiring less cueing and scaffolding techniques from the clinician.  Patient still struggles with focusing during complex functional tasks. Clinician will continue to use the cognitive strategies workbook and focus on internal memory aids for the next session.   Speech Therapy Frequency 2x / week   Duration Other (comment)   Treatment/Interventions Compensatory techniques;Cognitive reorganization;Internal/external aids;Functional tasks;SLP instruction and feedback;Compensatory strategies;Patient/family education   Potential to Achieve Goals Good   Potential Considerations Ability to learn/carryover information;Cooperation/participation level;Previous level of function;Severity of impairments;Family/community support   SLP Home  Exercise Plan Perplexor logic problems   Consulted and Agree with Plan of Care Patient      Patient will benefit from skilled therapeutic intervention in order to improve the following deficits and impairments:   Cognitive communication deficit    Problem List There are no active problems to display for this patient.   Aline August 10/14/2016, 8:22 AM  [place] MAIN [facility] SERVICES [address] [place], [place], [address] Phone: [phone]   Fax:  [phone]   Name: Natasha [name] MRN: [id number] Date of Birth: 12-13-1985

## 2016-10-16 ENCOUNTER — Ambulatory Visit: Payer: 59 | Admitting: Physical Therapy

## 2016-10-17 ENCOUNTER — Encounter: Payer: Self-pay | Admitting: Speech Pathology

## 2016-10-17 ENCOUNTER — Ambulatory Visit: Payer: Commercial Managed Care - HMO | Admitting: Speech Pathology

## 2016-10-17 DIAGNOSIS — R41841 Cognitive communication deficit: Secondary | ICD-10-CM | POA: Diagnosis not present

## 2016-10-17 NOTE — Therapy (Signed)
[place] MAIN [facility] SERVICES [address] [place], [place], [address] Phone: [phone]   Fax:  [phone]  Speech Language Pathology Treatment/Discharge Summary  Patient Details  Name: SHEVY YANEY MRN: [id number] Date of Birth: [birth year] Referring Provider: Barbaraann Boys  Encounter Date: 10/17/2016      End of Session - 10/17/16 1148    Visit Number 30   Number of Visits 30   Date for SLP Re-Evaluation 11/27/16   SLP Start Time 1000   SLP Stop Time  1100   SLP Time Calculation (min) 60 min   Activity Tolerance Patient tolerated treatment well      Past Medical History:  Diagnosis Date  . Polysubstance abuse   . Restless leg syndrome unk    Past Surgical History:  Procedure Laterality Date  . CHOLECYSTECTOMY    . TUBAL LIGATION      There were no vitals filed for this visit.      Subjective Assessment - 10/17/16 1147    Subjective Patient was friendly and upbeat and worked hard throughout the session.   Currently in Pain? No/denies               ADULT SLP TREATMENT - 10/17/16 0001      General Information   Behavior/Cognition Alert;Cooperative;Pleasant mood   HPI TBI     Treatment Provided   Treatment provided Cognitive-Linquistic     Pain Assessment   Pain Assessment No/denies pain     Cognitive-Linquistic Treatment   Treatment focused on Cognition;Patient/family/caregiver education   Skilled Treatment Re-testing with Cognitive Linguistic Quick Test     Assessment / Recommendations / Plan   Plan Discharge SLP treatment due to (comment)     Progression Toward Goals   Progression toward goals Goals met, education completed, patient discharged from SLP     Cognitive Linguistic Quick Test  The Cognitive Linguistic Quick Test (CLQT) was administered to assess the relative status of five cognitive domains: attention, memory, language, executive functioning, and visuospatial skills. Scores from  10 tasks were used to estimate severity ratings (for age groups 18-69 years and 70-89 years) for each domain, a clock drawing task, as well as an overall composite severity rating of cognition.   Task     10/17/16 03/16/15  Criterion Cut Scores Personal Facts   8/8  8/8   8  Symbol Cancellation    12/12  10/12   11 Confrontation Naming    10/10  10/10   10 Clock Drawing     13/13  9/13   12 Story Retelling       8/8  8/10   6 Symbol Trails      10/10 4/10   9 Generative Naming      5/9  3/9   5 Design Memory      5/6  4/6   5 Mazes        4/8  3/8   7 Design Generation    1/13  3/13   6   Cognitive Domain  10/17/16 03/16/15 Attention   WNL  Mild  Memory   WNL  Mild Executive Function  Mild  Severe Language   WNL  WNL Visuospatial Skills  Mild  Mild Clock Drawing   WNL  Moderate  Composite Severity Rating WNL       SLP Education - 10/17/16 1147    Education provided Yes   Education Details continue to work on the [employer]  Person(s) Educated Patient   Methods Explanation   Comprehension Verbalized understanding            SLP Long Term Goals - 10/17/16 1151      SLP LONG TERM GOAL #1   Title Patient will demonstrate functional cognitive-communication skills for independent completion of personal responsibilities.   Status Achieved     SLP LONG TERM GOAL #2   Title Patient will independently complete complex executive function skills tasks with 80% accuracy.   Status Partially Met     SLP LONG TERM GOAL #3   Title Patient will complete complex attention tasks with 80% accuracy.   Status Partially Met     SLP LONG TERM GOAL #4   Title Patient will complete memory strategy activities with 80% accuracy.   Status Achieved     SLP LONG TERM GOAL #5   Title Patient will generate grammatical and cogent sentences to complete abstract/complex linguistic tasks with 80% accuracy   Status Achieved          Plan - 10/17/16 1149    Clinical  Impression Statement The patient has made measurable gains in cognitive skills.  Re-testing with the Cognitive Linguistic Quick Test (CLQT) indicate a composite severity rating of WNL.  The patient tests with continuing mild deficits in the domains of executive functions and visuospatial skills.  She is now testing WNL function in the domains of attention, memory, language and clock drawing.  In general, the patient demonstrates improved scanning for information in a complex setting and improved perseverance.  The patient is demonstrating behaviors that interfere with reading comprehension and new learning.  Behaviors include: impulsive responses, mis-reading words, reduced flexibility, reduced access to common knowledge, and faulty memory for information.  The patient is taking feedback regarding her responses and has improved in her ability to modify her response.    Speech Therapy Frequency Other (comment)  Discharge   Treatment/Interventions Compensatory techniques;Cognitive reorganization;Internal/external aids;Functional tasks;SLP instruction and feedback;Compensatory strategies;Patient/family education   Potential to Achieve Goals Good   Potential Considerations Ability to learn/carryover information;Cooperation/participation level;Previous level of function;Severity of impairments;Family/community support   SLP Home Exercise Plan Memory strategy workbook, commercially available puzzle books   Consulted and Agree with Plan of Care Patient      Patient will benefit from skilled therapeutic intervention in order to improve the following deficits and impairments:   Cognitive communication deficit    Problem List There are no active problems to display for this patient.  Leroy Sea, MS/CCC- SLP  Lou Miner 10/17/2016, 11:56 AM  [place] MAIN [facility] SERVICES [address] [place], [place], [address] Phone: [phone]   Fax:   [phone]   Name: JAYLON GRODE MRN: [id number] Date of Birth: [birth year]

## 2016-10-20 ENCOUNTER — Ambulatory Visit: Payer: Commercial Managed Care - HMO | Admitting: Physical Therapy

## 2016-10-20 ENCOUNTER — Ambulatory Visit: Payer: Commercial Managed Care - HMO | Admitting: Occupational Therapy

## 2016-10-20 ENCOUNTER — Encounter: Payer: Self-pay | Admitting: Physical Therapy

## 2016-10-20 DIAGNOSIS — R278 Other lack of coordination: Secondary | ICD-10-CM

## 2016-10-20 DIAGNOSIS — M6281 Muscle weakness (generalized): Secondary | ICD-10-CM

## 2016-10-20 DIAGNOSIS — R2681 Unsteadiness on feet: Secondary | ICD-10-CM

## 2016-10-20 DIAGNOSIS — R41841 Cognitive communication deficit: Secondary | ICD-10-CM | POA: Diagnosis not present

## 2016-10-20 DIAGNOSIS — R262 Difficulty in walking, not elsewhere classified: Secondary | ICD-10-CM

## 2016-10-20 NOTE — Therapy (Signed)
[place] MAIN [facility] SERVICES [address] [place], [place], [address] Phone: [phone]   Fax:  [phone]  Physical Therapy Treatment Discharge Summary  Patient Details  Name: Natasha [name] MRN: [id number] Date of Birth: 1985-09-01 Referring Provider: Barbaraann Boys  Encounter Date: 10/20/2016      PT End of Session - 10/20/16 1437    Visit Number 3   Date for PT Re-Evaluation 12/01/16   Authorization Type 34 visits beginning 05/01/16 ;23/23 visits   PT Start Time 0215   PT Stop Time 0300   PT Time Calculation (min) 45 min   Equipment Utilized During Treatment Gait belt   Activity Tolerance Patient tolerated treatment well   Behavior During Therapy [facility] for tasks assessed/performed      Past Medical History:  Diagnosis Date  . Polysubstance abuse   . Restless leg syndrome unk    Past Surgical History:  Procedure Laterality Date  . CHOLECYSTECTOMY    . TUBAL LIGATION      There were no vitals filed for this visit.      Subjective Assessment - 10/20/16 1435    Subjective Patient is doing well today. She is doing more walking and not using her loftstrnad crutches in the house and one loftstrand crutch out doors.    Pertinent History TBI   Patient Stated Goals to be able to walk without the loftstrand crutches   Currently in Pain? No/denies   Pain Score 0-No pain   Pain Onset In the past 7 days   Multiple Pain Sites No     Neuromuscular training: TM walking at 1. 0 miles / hour  Toe tapping 6 inch stool without UE assist Tandem gait in // bars x 4 laps  Side stepping on blue foam balance beam x 5 lengths of the parallel bars  Mini squats 2x10 Heel raises 2x10 Patient performed outcome measures and has achieved goals #1,2,3 and goals 4 and 5 are ongoing.                             PT Education - 10/20/16 1436    Education provided Yes   Education Details HEP   Person(s) Educated Patient    Methods Explanation   Comprehension Verbalized understanding             PT Long Term Goals - 10/20/16 1621      PT LONG TERM GOAL #1   Title Patient will reduce timed up and go to <11 seconds to reduce fall risk and demonstrate improved transfer/gait ability.   Baseline 10.89 sec   Time 12   Period Weeks   Status Achieved     PT LONG TERM GOAL #2   Title Patient will increase six minute walk test distance to >1000 for progression to community ambulator and improve gait ability   Baseline 1060   Time 12   Period Weeks   Status Achieved     PT LONG TERM GOAL #3   Title Patient will increase 10 meter walk test to >1.55ms as to improve gait speed for better community ambulation and to reduce fall    Baseline 1.0 m/ sec   Time 12   Period Weeks   Status Achieved     PT LONG TERM GOAL #4   Title Patient will tolerate 5 seconds of single leg stance without loss of balance to improve ability to get in and out  of shower safely   Baseline 5 sec   Time 12   Period Weeks   Status On-going     PT LONG TERM GOAL #5   Title Patient (< 41 years old) will complete five times sit to stand test in < 10 seconds indicating an increased LE strength and improved balance   Baseline 11 sec   Time 12   Period Weeks   Status On-going               Plan - 2016-11-10 1438    Clinical Impression Statement Patient continues to have  decreased dynamic standing balance and decreased BLE coordination with wide base of support and decreased gait speed.She is ambulating without AD indoors and with 1 loftstrand crutch outdoors.  She is able to perform advanced balance exercises with CGA for all exercises. Patient has met goal #1,# 2, #3 and her goals #4 and #5 are ongoing. She is discharged to HEP. Marland Kitchen   Rehab Potential Good   PT Frequency 1x / week   PT Duration 12 weeks   PT Treatment/Interventions Therapeutic activities;Therapeutic exercise;Balance training;Neuromuscular re-education;Stair  training;Gait training;Patient/family education   PT Next Visit Plan strengthening and balance training; gait training   PT Home Exercise Plan As prescribed   Consulted and Agree with Plan of Care Patient      Patient will benefit from skilled therapeutic intervention in order to improve the following deficits and impairments:  Abnormal gait, Decreased balance, Decreased endurance, Difficulty walking, Decreased cognition, Decreased safety awareness, Decreased strength, Impaired UE functional use, Obesity  Visit Diagnosis: Other lack of coordination  Difficulty in walking, not elsewhere classified  Unsteadiness on feet  Muscle weakness (generalized)       G-Codes - 2016-11-10 1624    Functional Assessment Tool Used (Outpatient Only) TUG, 10 MW, 6 MW, 5 x sit to stand   Functional Limitation Mobility: Walking and moving around   Mobility: Walking and Moving Around Current Status [phone]) At least 40 percent but less than 60 percent impaired, limited or restricted   Mobility: Walking and Moving Around Goal Status ([id number]) At least 40 percent but less than 60 percent impaired, limited or restricted      Problem List There are no active problems to display for this patient.  Alanson Puls, PT, DPT [place], [place] S 11-10-16, 4:28 PM  [place] MAIN [facility] SERVICES [address] [place], [place], [address] Phone: [phone]   Fax:  [phone]  Name: Natasha [name] MRN: [id number] Date of Birth: [birth year]
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
## 2016-11-21 DIAGNOSIS — Z720 Tobacco use: Secondary | ICD-10-CM | POA: Insufficient documentation

## 2017-01-01 IMAGING — CR DG FOOT COMPLETE 3+V*R*
3 series · 3 of 3 positions shown · non-contrast
Comparison: None.

CLINICAL DATA: Pt was bit by mothers blue tick hound 3 days ago
after she stepped on dogs hurt paw, bite marks on the last aspect of
the right MCP joint area

EXAM:
RIGHT FOOT COMPLETE - 3+ VIEW

[foot ap]
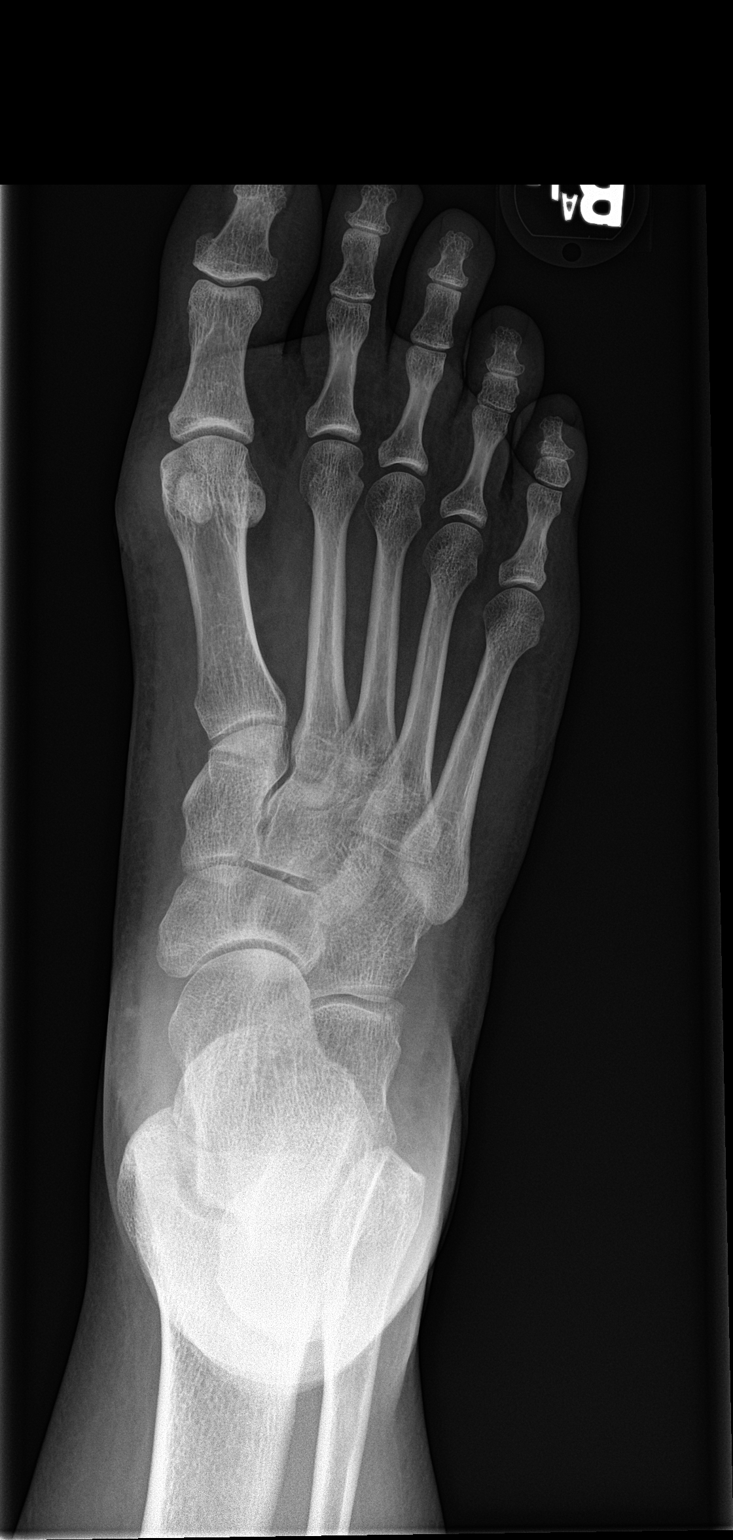

[foot obl]
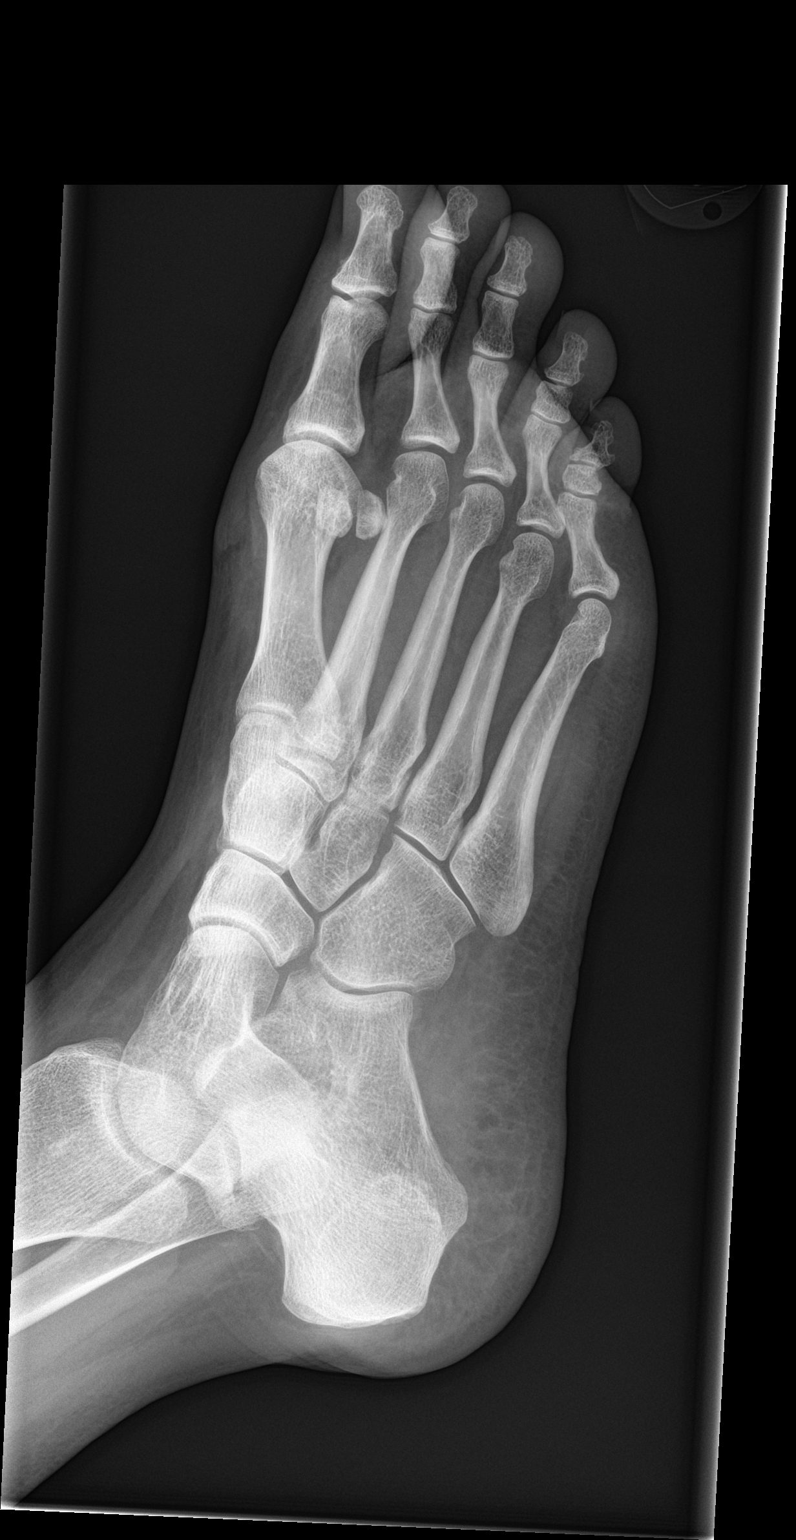

[foot lat]
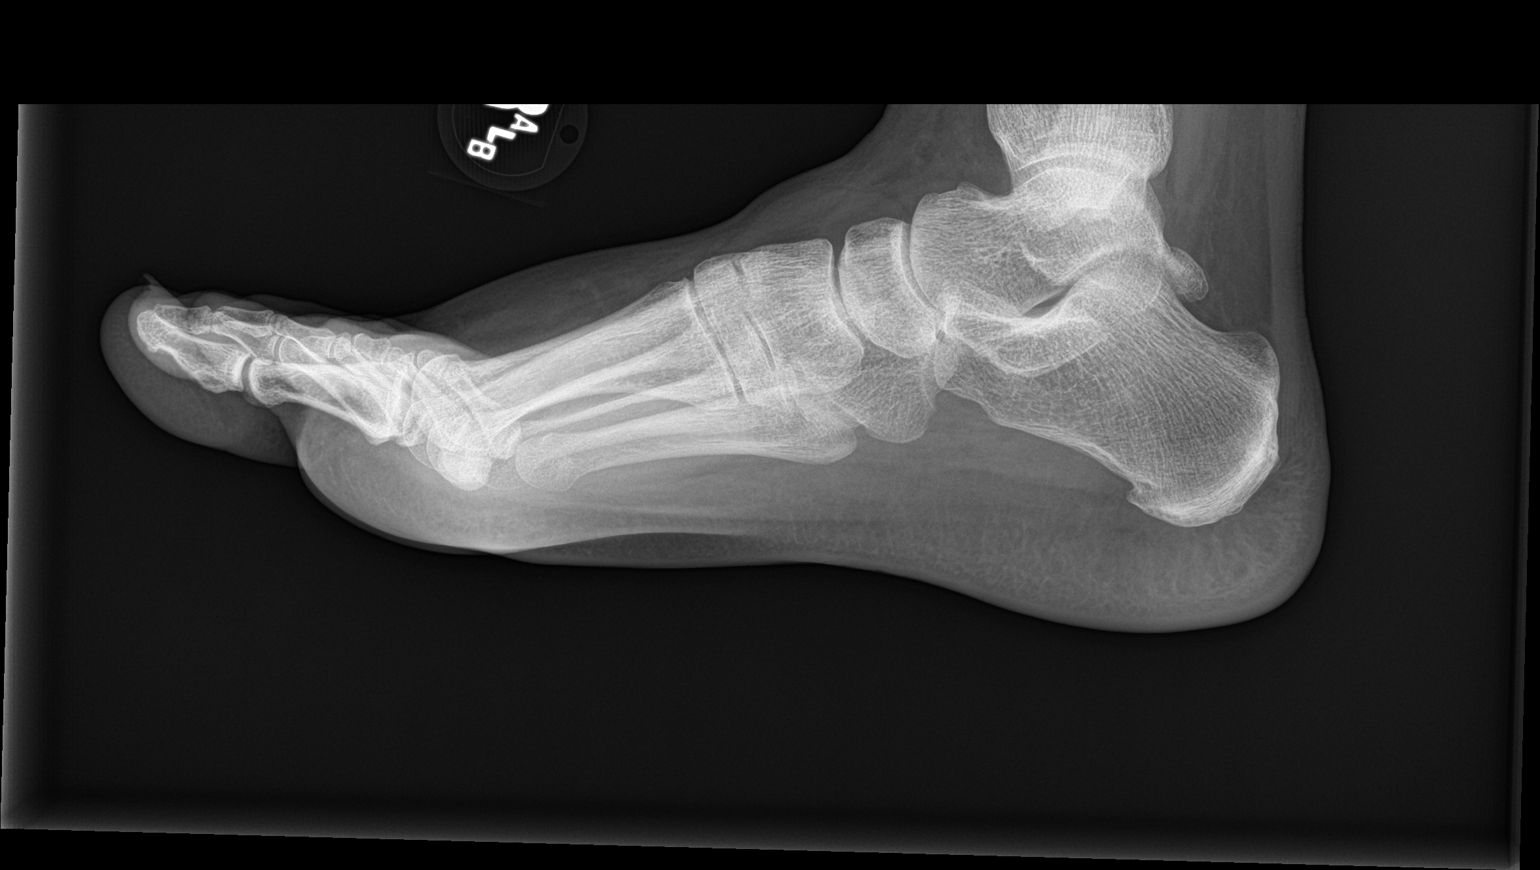

[3 of 3 positions shown; findings below may reference images not displayed]

FINDINGS: There is no evidence of fracture or dislocation. There is no
evidence of arthropathy or other focal bone abnormality. Soft
tissues are unremarkable. No subcutaneous gas or radiodense foreign
body.
IMPRESSION: Negative.

## 2017-05-26 ENCOUNTER — Encounter: Payer: Self-pay | Admitting: Physical Therapy

## 2017-05-26 ENCOUNTER — Ambulatory Visit: Payer: 59 | Attending: Pediatrics | Admitting: Physical Therapy

## 2017-05-26 DIAGNOSIS — R2681 Unsteadiness on feet: Secondary | ICD-10-CM | POA: Diagnosis present

## 2017-05-26 DIAGNOSIS — R262 Difficulty in walking, not elsewhere classified: Secondary | ICD-10-CM | POA: Diagnosis present

## 2017-05-26 DIAGNOSIS — R278 Other lack of coordination: Secondary | ICD-10-CM | POA: Diagnosis not present

## 2017-05-26 NOTE — Therapy (Signed)
[facility] MAIN [facility] SERVICES [address] [place], [place], [address] Phone: [phone]   Fax:  [phone]  Physical Therapy Evaluation  Patient Details  Name: Natasha [name] MRN: [id number] Date of Birth: [birth year] Referring Provider: Orene Desanctis   Encounter Date: 05/26/2017  PT End of Session - 05/26/17 1529    Visit Number  1    Number of Visits  17    Date for PT Re-Evaluation  09/15/17    PT Start Time  0315    PT Stop Time  0415    PT Time Calculation (min)  60 min    Equipment Utilized During Treatment  Gait belt    Activity Tolerance  Patient tolerated treatment well    Behavior During Therapy  [facility] for tasks assessed/performed       Past Medical History:  Diagnosis Date  . Polysubstance abuse (HCC)   . Restless leg syndrome unk    Past Surgical History:  Procedure Laterality Date  . CHOLECYSTECTOMY    . TUBAL LIGATION      There were no vitals filed for this visit.   Subjective Assessment - 05/26/17 1522    Subjective  Patient reports that she would like to not need to use the loftstrand curtch.     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 She has been living at her moms house with her mom and step dad. She uses a loftstand crutch and walks indoors and outdoors. She has difficulty ascending and descending the steps.    Limitations  Walking    Patient Stated Goals  to be able to walk without the loftstrand crutches outdoors on uneven ground    Currently in Pain?  No/denies    Pain Score  0-No pain    Pain Onset  In the past 7 days         [facility] PT Assessment - 05/26/17 1525      Assessment   Medical Diagnosis  TBI    Referring Provider  Richardine Service, Clydie Braun    Onset Date/Surgical Date  10/02/14    Hand Dominance  Right    Prior Therapy  outpatient PT       Precautions   Precautions  None      Restrictions   Weight Bearing Restrictions  No      Balance Screen   Has the patient fallen in the  past 6 months  No    Has the patient had a decrease in activity level because of a fear of falling?   Yes    Is the patient reluctant to leave their home because of a fear of falling?   No      Home Environment   Living Environment  Private residence    Available Help at Discharge  Family    Type of Home  Mobile home    Home Access  Stairs to enter    Entrance Stairs-Number of Steps  4    Entrance Stairs-Rails  Right    Home Layout  One level    Home Equipment  -- loftstrand crutch      Prior Function   Level of Independence  Independent with basic ADLs;Independent with household mobility without device;Independent with community mobility with device    Vocation  On disability    Leisure  walking, TV , be with kids      Cognition   Overall Cognitive Status  Within Functional Limits for tasks assessed  POSTURE: WNL   PROM/AROM: BUE and BLE WNL   STRENGTH:  Graded on a 0-5 scale Muscle Group Left Right                          Hip Flex 4/5 4/5  Hip Abd 5/5 5/5  Hip Add 2/5 2/5  Hip Ext 3/5 3/5  Hip IR/ER 4/5 4/5  Knee Flex 5/5 5/5  Knee Ext 5/5 5/5  Ankle DF 5/5 5/5  Ankle PF 4/5 4/5   SENSATION: WNL    FUNCTIONAL MOBILITY: independent with transfers and independent with bed mobility   BALANCE: Static Standing Balance  Normal Able to maintain standing balance against maximal resistance   Good Able to maintain standing balance against moderate resistance   Good-/Fair+ Able to maintain standing balance against minimal resistance x  Fair Able to stand unsupported without UE support and without LOB for 1-2 min   Fair- Requires Min A and UE support to maintain standing without loss of balance   Poor+ Requires mod A and UE support to maintain standing without loss of balance   Poor Requires max A and UE support to maintain standing balance without loss    Standing Dynamic Balance  Normal Stand independently unsupported, able to weight shift and cross  midline maximally   Good Stand independently unsupported, able to weight shift and cross midline moderately   Good-/Fair+ Stand independently unsupported, able to weight shift across midline minimally x  Fair Stand independently unsupported, weight shift, and reach ipsilaterally, loss of balance when crossing midline   Poor+ Able to stand with Min A and reach ipsilaterally, unable to weight shift   Poor Able to stand with Mod A and minimally reach ipsilaterally, unable to cross midline.       GAIT: Ambulates with wide base of support and 1 loftstrand crutch for intermediate and short distances.   OUTCOME MEASURES: TEST Outcome Interpretation  5 times sit<>stand 13.61 sec >60 yo, >15 sec indicates increased risk for falls  10 meter walk test     1.11            m/s <1.0 m/s indicates increased risk for falls; limited community ambulator  Timed up and Go     11.96            sec <14 sec indicates increased risk for falls  6 minute walk test    1125            Feet 1000 feet is community Financial controller 45/56 <36/56 (100% risk for falls), 37-45 (80% risk for falls); 46-51 (>50% risk for falls); 52-55 (lower risk <25% of falls)       HEP: Standing hip abd x 10  Standing hip ext x 10  Cues for posture correction and not to lean fwd with hip extension      .              PT Education - 05/26/17 1528    Education provided  Yes    Education Details  POC    Person(s) Educated  Patient    Methods  Explanation    Comprehension  Verbalized understanding       PT Short Term Goals - 05/26/17 1619      PT SHORT TERM GOAL #1   Title  Patient will be independent in home exercise program to improve strength/mobility for better functional independence with ADLs.    Time  4    Period  Weeks    Status  New    Target Date  06/23/17        PT Long Term Goals - 05/26/17 1614      PT LONG TERM GOAL #1   Title  Patient will be able to perform ascending and  descending  4 steps without railing to be able to carry packages safely.     Baseline  unable and needs railing    Time  8    Period  Weeks    Status  New    Target Date  07/21/17      PT LONG TERM GOAL #2   Title  Patient will increase bERG balance test score 6 points to improve balance for safe ambulation without assistive device.    Baseline  45/56    Time  8    Period  Weeks    Status  New    Target Date  07/21/17      PT LONG TERM GOAL #3   Title  Patient will improve functional reach to greater than 5 inches to improve dynamic standing balance and safety.     Baseline  5 inches    Time  8    Period  Weeks    Status  New    Target Date  07/21/17      PT LONG TERM GOAL #4   Title  Patient will tolerate 5 seconds of single leg stance without loss of balance to improve ability to get in and out of shower safely    Baseline  2 sec     Time  8    Period  Weeks    Status  New    Target Date  07/21/17             Plan - 05/26/17 1626    Clinical Impression Statement  Patient is 32 yr old female with PMHx of TBI 6/16. She has decreased static and dynamic standing balance deficits, decreased gait with loftstrand crutch on level surfaces and needs a railing with ascending and descending steps. Her berg balance is 45/56 . She will benefit from skilled PT to improve balance and mobility to be able to ambulate without assistive device.     Clinical Presentation  Stable    Clinical Decision Making  Low    Rehab Potential  Good    PT Frequency  2x / week    PT Duration  8 weeks    PT Treatment/Interventions  Therapeutic activities;Therapeutic exercise;Balance training;Neuromuscular re-education;Stair training;Gait training;Patient/family education;Aquatic Therapy;Manual techniques    PT Next Visit Plan  balance and gait training    PT Home Exercise Plan  4 way hip    Consulted and Agree with Plan of Care  Patient       Patient will benefit from skilled therapeutic  intervention in order to improve the following deficits and impairments:  Abnormal gait, Decreased balance, Decreased endurance, Decreased mobility, Difficulty walking, Obesity, Decreased cognition, Decreased activity tolerance, Decreased strength  Visit Diagnosis: Other lack of coordination  Difficulty in walking, not elsewhere classified  Unsteadiness on feet     Problem List There are no active problems to display for this patient.   [address]Sanya Kobrin S, [place] DPT 05/26/2017, 4:40 PM   [facility] [address] [place], [place], [address] Phone: [phone]   Fax:  [phone]  Name: Natasha [name] MRN: [id number] Date of Birth: 12/21/1985

## 2017-05-26 NOTE — Patient Instructions (Signed)
HIP / KNEE: Flexion -   . Raise knee toward chest. Keep back straight. Hold _2__ seconds. __10_ reps per set, _2__ sets per day, _7__ days per week  Copyright  VHI. All rights reserved.  Standing Hip Extension    Bring leg back as far as possible. Use ___0_ lbs on ankle. Repeat with other leg. Repeat __10__ times. Do _2__ sessions per day.  http://gt2.exer.us/396   Copyright  VHI. All rights reserved.  Standing Hip Adbduction    Lift leg out to side, bring back to midline. Use _0___ lbs on ankle. Repeat with other leg. Repeat __10__ times. Do _2___ sessions per day.  http://gt2.exer.us/394   Copyright  VHI. All rights reserved.

## 2017-06-10 ENCOUNTER — Ambulatory Visit: Payer: 59 | Admitting: Physical Therapy

## 2017-06-17 ENCOUNTER — Ambulatory Visit: Payer: 59 | Admitting: Physical Therapy

## 2017-07-21 ENCOUNTER — Ambulatory Visit: Payer: 59 | Admitting: Occupational Therapy

## 2017-07-21 ENCOUNTER — Encounter: Payer: Self-pay | Admitting: Occupational Therapy

## 2017-07-21 ENCOUNTER — Ambulatory Visit: Payer: 59 | Attending: Pediatrics

## 2017-07-21 ENCOUNTER — Other Ambulatory Visit: Payer: Self-pay

## 2017-07-21 DIAGNOSIS — R2681 Unsteadiness on feet: Secondary | ICD-10-CM | POA: Diagnosis present

## 2017-07-21 DIAGNOSIS — M6281 Muscle weakness (generalized): Secondary | ICD-10-CM | POA: Insufficient documentation

## 2017-07-21 DIAGNOSIS — R278 Other lack of coordination: Secondary | ICD-10-CM | POA: Diagnosis present

## 2017-07-21 DIAGNOSIS — R262 Difficulty in walking, not elsewhere classified: Secondary | ICD-10-CM | POA: Insufficient documentation

## 2017-07-21 DIAGNOSIS — Z8669 Personal history of other diseases of the nervous system and sense organs: Secondary | ICD-10-CM | POA: Insufficient documentation

## 2017-07-21 DIAGNOSIS — R29810 Facial weakness: Secondary | ICD-10-CM | POA: Diagnosis present

## 2017-07-21 NOTE — Addendum Note (Signed)
Addended by: Ria CommentHUPRICH, JASON D on: 07/21/2017 05:15 PM   Modules accepted: Orders
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
## 2017-07-22 ENCOUNTER — Encounter: Payer: Self-pay | Admitting: Occupational Therapy
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
## 2017-07-22 NOTE — Addendum Note (Signed)
Addended by: Avon GullyJAGENTENFL, Chaden Doom M on: 07/22/2017 11:31 AM   Modules accepted: Orders

## 2017-07-27 ENCOUNTER — Encounter: Payer: Self-pay | Admitting: Physical Therapy

## 2017-07-27 ENCOUNTER — Ambulatory Visit: Payer: Commercial Managed Care - HMO | Attending: Pediatrics | Admitting: Physical Therapy

## 2017-07-27 VITALS — BP 137/80 | HR 89

## 2017-07-27 DIAGNOSIS — R29810 Facial weakness: Secondary | ICD-10-CM | POA: Diagnosis present

## 2017-07-27 DIAGNOSIS — M6281 Muscle weakness (generalized): Secondary | ICD-10-CM

## 2017-07-27 DIAGNOSIS — R262 Difficulty in walking, not elsewhere classified: Secondary | ICD-10-CM | POA: Insufficient documentation

## 2017-07-27 DIAGNOSIS — R278 Other lack of coordination: Secondary | ICD-10-CM | POA: Insufficient documentation

## 2017-07-27 DIAGNOSIS — R531 Weakness: Secondary | ICD-10-CM | POA: Diagnosis present

## 2017-07-27 DIAGNOSIS — R2681 Unsteadiness on feet: Secondary | ICD-10-CM
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
## 2017-07-29 ENCOUNTER — Ambulatory Visit: Payer: Commercial Managed Care - HMO | Admitting: Occupational Therapy

## 2017-07-30 ENCOUNTER — Other Ambulatory Visit: Payer: Self-pay | Admitting: Family Medicine

## 2017-07-30 DIAGNOSIS — R221 Localized swelling, mass and lump, neck: Secondary | ICD-10-CM

## 2017-07-31 ENCOUNTER — Other Ambulatory Visit: Payer: Self-pay | Admitting: Family Medicine

## 2017-07-31 DIAGNOSIS — R221 Localized swelling, mass and lump, neck: Secondary | ICD-10-CM

## 2017-08-03 ENCOUNTER — Ambulatory Visit: Payer: 59

## 2017-08-04 ENCOUNTER — Ambulatory Visit: Payer: Commercial Managed Care - HMO

## 2017-08-04 DIAGNOSIS — R262 Difficulty in walking, not elsewhere classified: Secondary | ICD-10-CM

## 2017-08-04 DIAGNOSIS — R2681 Unsteadiness on feet: Secondary | ICD-10-CM

## 2017-08-04 DIAGNOSIS — R278 Other lack of coordination: Secondary | ICD-10-CM

## 2017-08-04 DIAGNOSIS — M6281 Muscle weakness (generalized): Secondary | ICD-10-CM | POA: Diagnosis not present
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
## 2017-08-06 ENCOUNTER — Encounter: Payer: 59 | Admitting: Occupational Therapy

## 2017-08-10 ENCOUNTER — Encounter: Payer: 59 | Admitting: Occupational Therapy

## 2017-08-12 ENCOUNTER — Ambulatory Visit: Payer: Commercial Managed Care - HMO | Admitting: Physical Therapy

## 2017-08-17 ENCOUNTER — Ambulatory Visit: Payer: 59

## 2017-08-18 ENCOUNTER — Encounter: Payer: Self-pay | Admitting: Occupational Therapy

## 2017-08-18 ENCOUNTER — Ambulatory Visit: Payer: Commercial Managed Care - HMO | Admitting: Occupational Therapy

## 2017-08-18 DIAGNOSIS — M6281 Muscle weakness (generalized): Secondary | ICD-10-CM | POA: Diagnosis not present

## 2017-08-18 DIAGNOSIS — R278 Other lack of coordination: Secondary | ICD-10-CM
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
## 2017-08-24 ENCOUNTER — Ambulatory Visit: Payer: Commercial Managed Care - HMO | Admitting: Occupational Therapy

## 2017-08-25 ENCOUNTER — Other Ambulatory Visit: Payer: Self-pay | Admitting: Family Medicine

## 2017-08-25 ENCOUNTER — Ambulatory Visit
Admission: RE | Admit: 2017-08-25 | Discharge: 2017-08-25 | Disposition: A | Discharge status: Home / Self Care | Payer: 59 | Source: Ambulatory Visit | Attending: Family Medicine | Admitting: Family Medicine

## 2017-08-25 DIAGNOSIS — R221 Localized swelling, mass and lump, neck: Secondary | ICD-10-CM | POA: Insufficient documentation

## 2017-08-26 ENCOUNTER — Ambulatory Visit: Payer: Commercial Managed Care - HMO | Admitting: Physical Therapy

## 2017-09-01 ENCOUNTER — Ambulatory Visit: Payer: 59 | Admitting: Physical Therapy

## 2017-09-01 ENCOUNTER — Encounter: Payer: 59 | Admitting: Occupational Therapy

## 2017-09-03 ENCOUNTER — Ambulatory Visit: Payer: Commercial Managed Care - HMO | Admitting: Physical Therapy

## 2017-09-03 ENCOUNTER — Ambulatory Visit: Payer: Commercial Managed Care - HMO | Admitting: Occupational Therapy

## 2017-09-08 ENCOUNTER — Ambulatory Visit: Payer: Commercial Managed Care - HMO

## 2017-09-08 ENCOUNTER — Ambulatory Visit: Payer: Commercial Managed Care - HMO | Attending: Pediatrics | Admitting: Occupational Therapy

## 2017-09-08 DIAGNOSIS — R262 Difficulty in walking, not elsewhere classified: Secondary | ICD-10-CM | POA: Insufficient documentation

## 2017-09-08 DIAGNOSIS — R29810 Facial weakness: Secondary | ICD-10-CM | POA: Diagnosis present

## 2017-09-08 DIAGNOSIS — R278 Other lack of coordination: Secondary | ICD-10-CM | POA: Insufficient documentation

## 2017-09-08 DIAGNOSIS — M6281 Muscle weakness (generalized): Secondary | ICD-10-CM

## 2017-09-08 DIAGNOSIS — R4189 Other symptoms and signs involving cognitive functions and awareness: Secondary | ICD-10-CM | POA: Insufficient documentation

## 2017-09-08 DIAGNOSIS — R2681 Unsteadiness on feet: Secondary | ICD-10-CM
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
## 2017-09-10 ENCOUNTER — Encounter: Payer: Self-pay | Admitting: Occupational Therapy

## 2017-09-10 ENCOUNTER — Other Ambulatory Visit: Payer: Self-pay

## 2017-09-10 ENCOUNTER — Encounter: Payer: Self-pay | Admitting: Physical Therapy

## 2017-09-10 ENCOUNTER — Ambulatory Visit: Payer: Commercial Managed Care - HMO | Admitting: Occupational Therapy

## 2017-09-10 ENCOUNTER — Ambulatory Visit: Payer: Commercial Managed Care - HMO | Admitting: Physical Therapy

## 2017-09-10 DIAGNOSIS — R278 Other lack of coordination: Secondary | ICD-10-CM

## 2017-09-10 DIAGNOSIS — M6281 Muscle weakness (generalized): Secondary | ICD-10-CM | POA: Diagnosis not present

## 2017-09-10 DIAGNOSIS — R4189 Other symptoms and signs involving cognitive functions and awareness: Secondary | ICD-10-CM

## 2017-09-10 DIAGNOSIS — R2681 Unsteadiness on feet: Secondary | ICD-10-CM

## 2017-09-10 DIAGNOSIS — R262 Difficulty in walking, not elsewhere classified: Secondary | ICD-10-CM
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
## 2017-09-15 ENCOUNTER — Encounter: Payer: Self-pay | Admitting: Occupational Therapy

## 2017-09-15 ENCOUNTER — Encounter: Payer: Self-pay | Admitting: Physical Therapy

## 2017-09-15 ENCOUNTER — Ambulatory Visit: Payer: Commercial Managed Care - HMO | Admitting: Physical Therapy

## 2017-09-15 ENCOUNTER — Ambulatory Visit: Payer: Commercial Managed Care - HMO | Admitting: Occupational Therapy

## 2017-09-15 DIAGNOSIS — M6281 Muscle weakness (generalized): Secondary | ICD-10-CM

## 2017-09-15 DIAGNOSIS — R278 Other lack of coordination: Secondary | ICD-10-CM

## 2017-09-15 DIAGNOSIS — R2681 Unsteadiness on feet: Secondary | ICD-10-CM

## 2017-09-15 DIAGNOSIS — R262 Difficulty in walking, not elsewhere classified: Secondary | ICD-10-CM

## 2017-09-15 DIAGNOSIS — R29810 Facial weakness: Secondary | ICD-10-CM
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
## 2017-09-17 ENCOUNTER — Encounter: Payer: Self-pay | Admitting: Physical Therapy

## 2017-09-17 ENCOUNTER — Ambulatory Visit: Payer: Commercial Managed Care - HMO | Admitting: Occupational Therapy

## 2017-09-17 ENCOUNTER — Encounter: Payer: Self-pay | Admitting: Occupational Therapy

## 2017-09-17 ENCOUNTER — Ambulatory Visit: Payer: Commercial Managed Care - HMO | Admitting: Physical Therapy

## 2017-09-17 DIAGNOSIS — R262 Difficulty in walking, not elsewhere classified: Secondary | ICD-10-CM

## 2017-09-17 DIAGNOSIS — R278 Other lack of coordination: Secondary | ICD-10-CM

## 2017-09-17 DIAGNOSIS — R2681 Unsteadiness on feet: Secondary | ICD-10-CM

## 2017-09-17 DIAGNOSIS — R29810 Facial weakness: Secondary | ICD-10-CM

## 2017-09-17 DIAGNOSIS — R4189 Other symptoms and signs involving cognitive functions and awareness: Secondary | ICD-10-CM

## 2017-09-17 DIAGNOSIS — M6281 Muscle weakness (generalized): Secondary | ICD-10-CM
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
## 2017-09-17 NOTE — Therapy (Signed)
[place] MAIN [facility] SERVICES [place], [place], [address] Phone: [phone]   Fax:  [phone]  Physical Therapy Treatment  Patient Details  Name: Natasha [name] MRN: [id number] Date of Birth: [birth year] Referring Provider: Janene Harvey   Encounter Date: 09/17/2017  PT End of Session - 09/17/17 1510    Visit Number  8    Number of Visits  29    Date for PT Re-Evaluation  10/27/17    Authorization Type  1/10 progress note    Equipment Utilized During Treatment  Gait belt    Activity Tolerance  Patient tolerated treatment well    Behavior During Therapy  [facility] for tasks assessed/performed       Past Medical History:  Diagnosis Date  . Polysubstance abuse ([place])   . Restless leg syndrome unk    Past Surgical History:  Procedure Laterality Date  . CHOLECYSTECTOMY    . TUBAL LIGATION      There were no vitals filed for this visit.  Subjective Assessment - 09/17/17 1504    Subjective  Patient is having intermittent pain in her right leg and right buttocks.     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 She has been living at her moms house with her mom and step dad. She uses a loftstand crutch and walks indoors and outdoors. She has difficulty ascending and descending the steps.     Limitations  Walking    Patient Stated Goals  to be able to walk without the loftstrand crutches outdoors on uneven ground    Currently in Pain?  Yes    Pain Score  2     Pain Location  Buttocks    Pain Orientation  Right    Pain Descriptors / Indicators  Aching    Pain Onset  1 to 4 weeks ago    Multiple Pain Sites  No    Pain Onset  More than a month ago       Neuromuscular training:  Agility ladder fwd / bwd x 4 in parallel bars  1/2 foam flat side up and balance with head turns left and right feet apart and feet together, cues for keeping balance and using ankle and hips to maintain center of gravity  1/2 foam flat side down  and balance with head turns left and right feet apart and feet together,; cues for keeping balance and using ankle and hips to maintain center of gravity  Standing on  foam tapping to the dina disk x 20  ; cues to maintain balance and posture correction  Side stepping left and right in parallel bars 10 feet x 3; rotation cues for upright posture and not rotating hips towards the direction of motion  step ups from floor to 6 inch stool x 20 bilateral; cues to try not to use UE and to keep correct head position  leg press 100 lbs x 20 x 3, cues not to snap knees during extension and to perform slowly for max strengthening   Tilt board fwd/bwd, side to side left and right; x 20 each way cues for posture correction    Patient has poor static and dynamic standing balance and needs frequent use of hands to steady and keep from losing balance.                     PT Education - 09/17/17 1510    Education provided  Yes  Education Details  HEP    Person(s) Educated  Patient    Methods  Explanation    Comprehension  Verbalized understanding       PT Short Term Goals - 07/21/17 0954      PT SHORT TERM GOAL #1   Title  Patient will be independent in home exercise program to improve strength/mobility for better functional independence with ADLs.    Time  4    Period  Weeks    Status  On-going    Target Date  08/18/17        PT Long Term Goals - 09/15/17 1502      PT LONG TERM GOAL #1   Title  Patient will be able to perform ascending and descending  4 steps without railing to be able to carry packages safely.     Baseline  unable and needs railing; 07/21/17: unable, needs railing; 5/21: no railings with ascent but unsteady, requires at least 1 railing to descend with poor eccentric control    Time  8    Period  Weeks    Status  Partially Met      PT LONG TERM GOAL #2   Title  Patient will increase BERG balance test score to 55/56 points to improve balance for safe  ambulation without assistive device.    Baseline  45/56; 07/21/17: 52/56    Time  8    Period  Weeks    Status  On-going      PT LONG TERM GOAL #3   Title  Patient will improve functional reach to greater than 5 inches to improve dynamic standing balance and safety.     Baseline  5 inches; 07/21/17: 13 inches;     Time  8    Period  Weeks    Status  Achieved      PT LONG TERM GOAL #4   Title  Patient will tolerate 5 seconds of single leg stance without loss of balance to improve ability to get in and out of shower safely    Baseline  2 sec; 07/21/17: 2.4s; 5/21: 4 seconds on LLE    Time  8    Period  Weeks    Status  On-going      PT LONG TERM GOAL #5   Title  Pt will improve 6MWT to at least 1500' in order to demonstrate clinically significant improvement in walkng endurance.     Baseline  07/21/17: 1200'; 5/21: 1.070    Time  8    Period  Weeks    Status  On-going            Plan - 09/17/17 1510    Clinical Impression Statement  Patient required min verbal cueing during matrix machine stepping, and required CGA during all dynamic standing balance activities. Patient required occasional rest breaks between exercises due to fatigue. Patient tolerated exercise well. Patient will continue to benefit from skilled therapy in order to improve dynamic standing balance activities and increase gait speed to reduce risk for falls    Rehab Potential  Good    PT Frequency  2x / week    PT Duration  6 weeks    PT Treatment/Interventions  Therapeutic activities;Therapeutic exercise;Balance training;Neuromuscular re-education;Stair training;Gait training;Patient/family education;Aquatic Therapy;Manual techniques    PT Next Visit Plan  balance and gait training    PT Home Exercise Plan  4 way hip, single leg balance, alternating toe tapping,     Consulted and Agree with  Plan of Care  Patient       Patient will benefit from skilled therapeutic intervention in order to improve the following  deficits and impairments:  Abnormal gait, Decreased balance, Decreased endurance, Decreased mobility, Difficulty walking, Obesity, Decreased cognition, Decreased activity tolerance, Decreased strength  Visit Diagnosis: Muscle weakness (generalized)  Other lack of coordination  Unsteadiness on feet  Difficulty in walking, not elsewhere classified  Generalized weakness  Impaired cognition     Problem List There are no active problems to display for this patient.   [address], [place] DPT 09/17/2017, 3:12 PM  [place] MAIN [facility] SERVICES [address] [place], [place], [address] Phone: [phone]   Fax:  [phone]  Name: Natasha [name] MRN: [id number] Date of Birth: 1985-05-29

## 2017-09-22 ENCOUNTER — Encounter: Payer: Self-pay | Admitting: Physical Therapy

## 2017-09-22 ENCOUNTER — Ambulatory Visit: Payer: Commercial Managed Care - HMO | Admitting: Physical Therapy

## 2017-09-22 DIAGNOSIS — R278 Other lack of coordination: Secondary | ICD-10-CM

## 2017-09-22 DIAGNOSIS — M6281 Muscle weakness (generalized): Secondary | ICD-10-CM

## 2017-09-22 DIAGNOSIS — R4189 Other symptoms and signs involving cognitive functions and awareness: Secondary | ICD-10-CM

## 2017-09-22 DIAGNOSIS — R262 Difficulty in walking, not elsewhere classified: Secondary | ICD-10-CM

## 2017-09-22 DIAGNOSIS — R2681 Unsteadiness on feet: Secondary | ICD-10-CM

## 2017-09-22 NOTE — Therapy (Signed)
[place] MAIN [facility] SERVICES [address] [place], [place], [address] Phone: [phone]   Fax:  [phone]  Physical Therapy Treatment  Patient Details  Name: Natasha [name] MRN: [id number] Date of Birth: 1986/01/24 Referring Provider: Janene Harvey   Encounter Date: 09/22/2017  PT End of Session - 09/22/17 1509    Visit Number  9    Number of Visits  29    Date for PT Re-Evaluation  10/27/17    Authorization Type  2/10 progress note    PT Start Time  0300    PT Stop Time  0345    PT Time Calculation (min)  45 min    Equipment Utilized During Treatment  Gait belt    Activity Tolerance  Patient tolerated treatment well    Behavior During Therapy  [facility] for tasks assessed/performed       Past Medical History:  Diagnosis Date  . Polysubstance abuse ([place])   . Restless leg syndrome unk    Past Surgical History:  Procedure Laterality Date  . CHOLECYSTECTOMY    . TUBAL LIGATION      There were no vitals filed for this visit.  Subjective Assessment - 09/22/17 1506    Subjective  Patient reports doing her piriforms stretch and is having no pain.     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 She has been living at her moms house with her mom and step dad. She uses a loftstand crutch and walks indoors and outdoors. She has difficulty ascending and descending the steps.     Limitations  Walking    Patient Stated Goals  to be able to walk without the loftstrand crutches outdoors on uneven ground    Currently in Pain?  No/denies    Pain Score  0-No pain    Pain Onset  1 to 4 weeks ago    Pain Onset  More than a month ago       Treatment: Standing on foam with 5 lbs trunk rotation  Standing on foam with 5 lbs and bench press out and bench press out towards ceiling x 20  Side stepping on blue balance beam left and right x 10 with intermittent UE support Standing on blue foam feet getting closer and closer together and reaching fwd  with ball sorting x 10 matrix side stepping x 10 , fwd/bwd x 10 , side stepping x 10, 22.5 lbs  Patient demonstrates decreased stance control with increased upper trunk instability with reaching.                       PT Education - 09/22/17 1508    Education provided  Yes    Education Details  HEP    Person(s) Educated  Patient    Methods  Explanation;Demonstration    Comprehension  Verbalized understanding;Returned demonstration       PT Short Term Goals - 07/21/17 0954      PT SHORT TERM GOAL #1   Title  Patient will be independent in home exercise program to improve strength/mobility for better functional independence with ADLs.    Time  4    Period  Weeks    Status  On-going    Target Date  08/18/17        PT Long Term Goals - 09/15/17 1502      PT LONG TERM GOAL #1   Title  Patient will be able to perform ascending and  descending  4 steps without railing to be able to carry packages safely.     Baseline  unable and needs railing; 07/21/17: unable, needs railing; 5/21: no railings with ascent but unsteady, requires at least 1 railing to descend with poor eccentric control    Time  8    Period  Weeks    Status  Partially Met      PT LONG TERM GOAL #2   Title  Patient will increase BERG balance test score to 55/56 points to improve balance for safe ambulation without assistive device.    Baseline  45/56; 07/21/17: 52/56    Time  8    Period  Weeks    Status  On-going      PT LONG TERM GOAL #3   Title  Patient will improve functional reach to greater than 5 inches to improve dynamic standing balance and safety.     Baseline  5 inches; 07/21/17: 13 inches;     Time  8    Period  Weeks    Status  Achieved      PT LONG TERM GOAL #4   Title  Patient will tolerate 5 seconds of single leg stance without loss of balance to improve ability to get in and out of shower safely    Baseline  2 sec; 07/21/17: 2.4s; 5/21: 4 seconds on LLE    Time  8    Period   Weeks    Status  On-going      PT LONG TERM GOAL #5   Title  Pt will improve 6MWT to at least 1500' in order to demonstrate clinically significant improvement in walkng endurance.     Baseline  07/21/17: 1200'; 5/21: 1.070    Time  8    Period  Weeks    Status  On-going            Plan - 09/22/17 1509    Clinical Impression Statement  Patient instructed in advanced dynamic and static balance exercise. Utilized stable and uneven surfaces to further challenge dynamic balance. Patient required min Vcs for correct positioning and exercise technique. Patient had a difficult time during narrow base of support challenges.  Patient exhibits better SLS ability being able to progress to foam; Patient would benefit from additional skilled PT Intervention to improve strength, balance and gait safety    Rehab Potential  Good    PT Frequency  2x / week    PT Duration  6 weeks    PT Treatment/Interventions  Therapeutic activities;Therapeutic exercise;Balance training;Neuromuscular re-education;Stair training;Gait training;Patient/family education;Aquatic Therapy;Manual techniques    PT Next Visit Plan  balance and gait training    PT Home Exercise Plan  4 way hip, single leg balance, alternating toe tapping,     Consulted and Agree with Plan of Care  Patient       Patient will benefit from skilled therapeutic intervention in order to improve the following deficits and impairments:  Abnormal gait, Decreased balance, Decreased endurance, Decreased mobility, Difficulty walking, Obesity, Decreased cognition, Decreased activity tolerance, Decreased strength  Visit Diagnosis: Muscle weakness (generalized)  Impaired cognition  Other lack of coordination  Unsteadiness on feet  Difficulty in walking, not elsewhere classified     Problem List There are no active problems to display for this patient.   [address], [place] DPT 09/22/2017, 3:15 PM  [place] MAIN [facility] SERVICES [address] [place], [place], [address] Phone: [phone]  Fax:  [phone]  Name: Natasha [name] MRN: [id number] Date of Birth: 06-21-1985

## 2017-09-24 ENCOUNTER — Ambulatory Visit: Payer: Commercial Managed Care - HMO | Admitting: Physical Therapy

## 2017-09-24 ENCOUNTER — Encounter: Payer: Self-pay | Admitting: Physical Therapy

## 2017-09-24 DIAGNOSIS — R2681 Unsteadiness on feet: Secondary | ICD-10-CM

## 2017-09-24 DIAGNOSIS — R262 Difficulty in walking, not elsewhere classified: Secondary | ICD-10-CM

## 2017-09-24 DIAGNOSIS — M6281 Muscle weakness (generalized): Secondary | ICD-10-CM | POA: Diagnosis not present

## 2017-09-24 DIAGNOSIS — R4189 Other symptoms and signs involving cognitive functions and awareness: Secondary | ICD-10-CM

## 2017-09-24 DIAGNOSIS — R278 Other lack of coordination: Secondary | ICD-10-CM

## 2017-09-24 NOTE — Therapy (Signed)
[place] MAIN [facility] SERVICES [address] [place], [place], [address] Phone: [phone]   Fax:  [phone]  Physical Therapy Treatment  Patient Details  Name: Natasha [name] MRN: [id number] Date of Birth: 12/19/1985 Referring Provider: Janene Harvey   Encounter Date: 09/24/2017  PT End of Session - 09/24/17 1521    Visit Number  10    Number of Visits  29    Date for PT Re-Evaluation  10/27/17    Authorization Type  3/10 progress note    PT Start Time  0300    PT Stop Time  0345    PT Time Calculation (min)  45 min    Equipment Utilized During Treatment  Gait belt    Activity Tolerance  Patient tolerated treatment well    Behavior During Therapy  [facility] for tasks assessed/performed       Past Medical History:  Diagnosis Date  . Polysubstance abuse ([place])   . Restless leg syndrome unk    Past Surgical History:  Procedure Laterality Date  . CHOLECYSTECTOMY    . TUBAL LIGATION      There were no vitals filed for this visit.  Subjective Assessment - 09/24/17 1520    Subjective  Patient reports doing her piriforms stretch and is having no pain.     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 She has been living at her moms house with her mom and step dad. She uses a loftstand crutch and walks indoors and outdoors. She has difficulty ascending and descending the steps.     Limitations  Walking    Patient Stated Goals  to be able to walk without the loftstrand crutches outdoors on uneven ground    Currently in Pain?  No/denies    Pain Score  0-No pain    Pain Onset  1 to 4 weeks ago    Multiple Pain Sites  No    Pain Onset  More than a month ago        neuromuscular training:  1/2 foam flat side up and balance with head turns left and right feet apart and feet together,; cues for keeping balance and using ankle and hips to maintain center of gravity  12/ kneeling with trunk rotation  x 20  ; cues for correct position and  technique  Fwd foot yellow disk, bwd foot purple foam and trunk rotation x 3; rotation cues for upright posture and not rotating hips towards the direction of motion  step ups from foam to 6 inch stool x 20 bilateral; cues to try not to use UE and to keep correct head position   Tilt board fwd/bwd, side to side left and right; cues for posture correction   Patient needs occasional verbal cueing to improve posture and cueing to correctly perform exercises slowly,                       PT Education - 09/24/17 1520    Education provided  Yes    Education Details  HEP    Person(s) Educated  Patient    Methods  Explanation    Comprehension  Verbalized understanding       PT Short Term Goals - 07/21/17 0954      PT SHORT TERM GOAL #1   Title  Patient will be independent in home exercise program to improve strength/mobility for better functional independence with ADLs.    Time  4  Period  Weeks    Status  On-going    Target Date  08/18/17        PT Long Term Goals - 09/15/17 1502      PT LONG TERM GOAL #1   Title  Patient will be able to perform ascending and descending  4 steps without railing to be able to carry packages safely.     Baseline  unable and needs railing; 07/21/17: unable, needs railing; 5/21: no railings with ascent but unsteady, requires at least 1 railing to descend with poor eccentric control    Time  8    Period  Weeks    Status  Partially Met      PT LONG TERM GOAL #2   Title  Patient will increase BERG balance test score to 55/56 points to improve balance for safe ambulation without assistive device.    Baseline  45/56; 07/21/17: 52/56    Time  8    Period  Weeks    Status  On-going      PT LONG TERM GOAL #3   Title  Patient will improve functional reach to greater than 5 inches to improve dynamic standing balance and safety.     Baseline  5 inches; 07/21/17: 13 inches;     Time  8    Period  Weeks    Status  Achieved      PT  LONG TERM GOAL #4   Title  Patient will tolerate 5 seconds of single leg stance without loss of balance to improve ability to get in and out of shower safely    Baseline  2 sec; 07/21/17: 2.4s; 5/21: 4 seconds on LLE    Time  8    Period  Weeks    Status  On-going      PT LONG TERM GOAL #5   Title  Pt will improve 6MWT to at least 1500' in order to demonstrate clinically significant improvement in walkng endurance.     Baseline  07/21/17: 1200'; 5/21: 1.070    Time  8    Period  Weeks    Status  On-going            Plan - 09/24/17 1522    Clinical Impression Statement  Patient demonstrates deficits with postural control in tandem and narrow stance on purple foam. Patient demonstrated hesitation with full weight shifting during trunk rotation on disk but minimal cueing resulted in good technique and no LOB.  Patient improved ability to challenge dynamic balance with supervision and with UE assist today.  Patient shows good BLE strength and hip control during. Patient will continue to benefit from skilled physical therapy to improve endurance and dynamic balance to reduce fall risk.    Rehab Potential  Good    PT Frequency  2x / week    PT Duration  6 weeks    PT Treatment/Interventions  Therapeutic activities;Therapeutic exercise;Balance training;Neuromuscular re-education;Stair training;Gait training;Patient/family education;Aquatic Therapy;Manual techniques    PT Next Visit Plan  balance and gait training    PT Home Exercise Plan  4 way hip, single leg balance, alternating toe tapping,     Consulted and Agree with Plan of Care  Patient       Patient will benefit from skilled therapeutic intervention in order to improve the following deficits and impairments:  Abnormal gait, Decreased balance, Decreased endurance, Decreased mobility, Difficulty walking, Obesity, Decreased cognition, Decreased activity tolerance, Decreased strength  Visit Diagnosis: Muscle weakness  (generalized)  Impaired cognition  Other lack of coordination  Unsteadiness on feet  Difficulty in walking, not elsewhere classified     Problem List There are no active problems to display for this patient.   [address] [place] DPT 09/24/2017, 3:30 PM  [place] MAIN [facility] SERVICES [address] [place], [place], [address] Phone: [phone]   Fax:  [phone]  Name: Natasha [name] MRN: [id number] Date of Birth: January 10, 1986

## 2017-09-28 ENCOUNTER — Ambulatory Visit: Payer: Commercial Managed Care - HMO | Admitting: Physical Therapy

## 2017-10-01 ENCOUNTER — Ambulatory Visit: Payer: Commercial Managed Care - HMO | Attending: Pediatrics | Admitting: Physical Therapy

## 2017-10-01 ENCOUNTER — Encounter: Payer: Self-pay | Admitting: Physical Therapy

## 2017-10-01 DIAGNOSIS — R262 Difficulty in walking, not elsewhere classified: Secondary | ICD-10-CM | POA: Diagnosis present

## 2017-10-01 DIAGNOSIS — R531 Weakness: Secondary | ICD-10-CM | POA: Diagnosis present

## 2017-10-01 DIAGNOSIS — R4189 Other symptoms and signs involving cognitive functions and awareness: Secondary | ICD-10-CM | POA: Insufficient documentation

## 2017-10-01 DIAGNOSIS — R278 Other lack of coordination: Secondary | ICD-10-CM | POA: Diagnosis present

## 2017-10-01 DIAGNOSIS — M6281 Muscle weakness (generalized): Secondary | ICD-10-CM | POA: Diagnosis present

## 2017-10-01 DIAGNOSIS — Z8669 Personal history of other diseases of the nervous system and sense organs: Secondary | ICD-10-CM | POA: Diagnosis present

## 2017-10-01 DIAGNOSIS — R2681 Unsteadiness on feet: Secondary | ICD-10-CM | POA: Insufficient documentation

## 2017-10-01 DIAGNOSIS — R29810 Facial weakness: Secondary | ICD-10-CM | POA: Insufficient documentation
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
## 2017-10-05 ENCOUNTER — Ambulatory Visit: Payer: Commercial Managed Care - HMO | Admitting: Physical Therapy

## 2017-10-08 ENCOUNTER — Encounter: Payer: Self-pay | Admitting: Physical Therapy

## 2017-10-08 ENCOUNTER — Ambulatory Visit: Payer: Commercial Managed Care - HMO | Admitting: Occupational Therapy

## 2017-10-08 ENCOUNTER — Encounter: Payer: Self-pay | Admitting: Occupational Therapy

## 2017-10-08 ENCOUNTER — Ambulatory Visit: Payer: Commercial Managed Care - HMO | Admitting: Physical Therapy

## 2017-10-08 ENCOUNTER — Other Ambulatory Visit: Payer: Self-pay

## 2017-10-08 DIAGNOSIS — R278 Other lack of coordination: Secondary | ICD-10-CM

## 2017-10-08 DIAGNOSIS — M6281 Muscle weakness (generalized): Secondary | ICD-10-CM

## 2017-10-08 DIAGNOSIS — R4189 Other symptoms and signs involving cognitive functions and awareness: Secondary | ICD-10-CM

## 2017-10-08 DIAGNOSIS — R2681 Unsteadiness on feet: Secondary | ICD-10-CM

## 2017-10-08 DIAGNOSIS — R262 Difficulty in walking, not elsewhere classified: Secondary | ICD-10-CM

## 2017-10-08 DIAGNOSIS — R29810 Facial weakness: Secondary | ICD-10-CM
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
## 2017-10-13 ENCOUNTER — Encounter: Payer: Self-pay | Admitting: Physical Therapy

## 2017-10-13 ENCOUNTER — Ambulatory Visit: Payer: Commercial Managed Care - HMO | Admitting: Occupational Therapy

## 2017-10-13 ENCOUNTER — Encounter: Payer: Self-pay | Admitting: Occupational Therapy

## 2017-10-13 ENCOUNTER — Ambulatory Visit: Payer: Commercial Managed Care - HMO | Admitting: Physical Therapy

## 2017-10-13 DIAGNOSIS — Z8669 Personal history of other diseases of the nervous system and sense organs: Secondary | ICD-10-CM

## 2017-10-13 DIAGNOSIS — R278 Other lack of coordination: Secondary | ICD-10-CM

## 2017-10-13 DIAGNOSIS — R4189 Other symptoms and signs involving cognitive functions and awareness: Secondary | ICD-10-CM

## 2017-10-13 DIAGNOSIS — M6281 Muscle weakness (generalized): Secondary | ICD-10-CM | POA: Diagnosis not present

## 2017-10-13 DIAGNOSIS — R262 Difficulty in walking, not elsewhere classified: Secondary | ICD-10-CM

## 2017-10-13 DIAGNOSIS — R29810 Facial weakness: Secondary | ICD-10-CM

## 2017-10-13 DIAGNOSIS — R531 Weakness: Secondary | ICD-10-CM

## 2017-10-13 DIAGNOSIS — R2681 Unsteadiness on feet: Secondary | ICD-10-CM
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
## 2017-10-15 ENCOUNTER — Encounter: Payer: Self-pay | Admitting: *Deleted

## 2017-10-16 ENCOUNTER — Encounter: Payer: Self-pay | Admitting: *Deleted

## 2017-10-16 ENCOUNTER — Ambulatory Visit: Payer: Commercial Managed Care - HMO | Admitting: Anesthesiology

## 2017-10-16 ENCOUNTER — Ambulatory Visit
Admission: RE | Admit: 2017-10-16 | Discharge: 2017-10-16 | Disposition: A | Discharge status: Home / Self Care | Payer: Commercial Managed Care - HMO | Source: Ambulatory Visit | Attending: Gastroenterology | Admitting: Gastroenterology

## 2017-10-16 ENCOUNTER — Encounter
Admission: RE | Disposition: A | Discharge status: Home / Self Care | Payer: Self-pay | Source: Ambulatory Visit | Attending: Gastroenterology

## 2017-10-16 ENCOUNTER — Other Ambulatory Visit: Payer: Self-pay

## 2017-10-16 DIAGNOSIS — K2950 Unspecified chronic gastritis without bleeding: Secondary | ICD-10-CM | POA: Diagnosis not present

## 2017-10-16 DIAGNOSIS — Z6835 Body mass index (BMI) 35.0-35.9, adult: Secondary | ICD-10-CM | POA: Insufficient documentation

## 2017-10-16 DIAGNOSIS — G2581 Restless legs syndrome: Secondary | ICD-10-CM | POA: Insufficient documentation

## 2017-10-16 DIAGNOSIS — K210 Gastro-esophageal reflux disease with esophagitis: Secondary | ICD-10-CM | POA: Diagnosis not present

## 2017-10-16 DIAGNOSIS — K2960 Other gastritis without bleeding: Secondary | ICD-10-CM | POA: Diagnosis not present

## 2017-10-16 DIAGNOSIS — F329 Major depressive disorder, single episode, unspecified: Secondary | ICD-10-CM | POA: Diagnosis not present

## 2017-10-16 DIAGNOSIS — K219 Gastro-esophageal reflux disease without esophagitis: Secondary | ICD-10-CM | POA: Diagnosis not present

## 2017-10-16 DIAGNOSIS — F419 Anxiety disorder, unspecified: Secondary | ICD-10-CM | POA: Diagnosis not present

## 2017-10-16 DIAGNOSIS — Z79899 Other long term (current) drug therapy: Secondary | ICD-10-CM | POA: Diagnosis not present

## 2017-10-16 DIAGNOSIS — Z791 Long term (current) use of non-steroidal anti-inflammatories (NSAID): Secondary | ICD-10-CM | POA: Diagnosis not present

## 2017-10-16 DIAGNOSIS — F17200 Nicotine dependence, unspecified, uncomplicated: Secondary | ICD-10-CM | POA: Insufficient documentation

## 2017-10-16 DIAGNOSIS — R1013 Epigastric pain: Secondary | ICD-10-CM | POA: Insufficient documentation

## 2017-10-16 DIAGNOSIS — E669 Obesity, unspecified: Secondary | ICD-10-CM | POA: Diagnosis not present

## 2017-10-16 HISTORY — DX: Edema, unspecified: R60.9

## 2017-10-16 HISTORY — DX: Unspecified intracranial injury with loss of consciousness status unknown, initial encounter: S06.9XAA

## 2017-10-16 HISTORY — DX: Tobacco use: Z72.0

## 2017-10-16 HISTORY — DX: Other specified behavioral and emotional disorders with onset usually occurring in childhood and adolescence: F98.8

## 2017-10-16 HISTORY — DX: Major depressive disorder, single episode, unspecified: F32.9

## 2017-10-16 HISTORY — DX: Mixed receptive-expressive language disorder: F80.2

## 2017-10-16 HISTORY — DX: Unspecified intracranial injury with loss of consciousness of unspecified duration, initial encounter: S06.9X9A

## 2017-10-16 HISTORY — DX: Gastro-esophageal reflux disease without esophagitis: K21.9

## 2017-10-16 HISTORY — DX: Anxiety disorder, unspecified: F41.9

## 2017-10-16 HISTORY — DX: Restless legs syndrome: G25.81

## 2017-10-16 HISTORY — DX: Opioid dependence, uncomplicated: F11.20

## 2017-10-16 HISTORY — DX: Depression, unspecified: F32.A

## 2017-10-16 HISTORY — PX: ESOPHAGOGASTRODUODENOSCOPY (EGD) WITH PROPOFOL: SHX5813

## 2017-10-16 HISTORY — DX: Localized edema: R60.0

## 2017-10-16 HISTORY — DX: Obesity, unspecified: E66.9

## 2017-10-16 HISTORY — DX: Other reduced mobility: Z74.09

## 2017-10-16 HISTORY — DX: Other psychoactive substance abuse, uncomplicated: F19.10

## 2017-10-16 LAB — POCT PREGNANCY, URINE: Preg Test, Ur: NEGATIVE

## 2017-10-16 SURGERY — ESOPHAGOGASTRODUODENOSCOPY (EGD) WITH PROPOFOL
Anesthesia: General

## 2017-10-16 MED ORDER — PROPOFOL 500 MG/50ML IV EMUL
INTRAVENOUS | Status: AC
  Filled 2017-10-16: qty 50

## 2017-10-16 MED ORDER — PROPOFOL 10 MG/ML IV BOLUS
INTRAVENOUS | Status: AC
Start: 2017-10-16 — End: ?
  Filled 2017-10-16: qty 20

## 2017-10-16 MED ORDER — MIDAZOLAM HCL 2 MG/2ML IJ SOLN
INTRAMUSCULAR | Status: AC
  Filled 2017-10-16: qty 2

## 2017-10-16 MED ORDER — GLYCOPYRROLATE 0.2 MG/ML IJ SOLN
INTRAMUSCULAR | Status: DC | PRN
  Administered 2017-10-16: 0.2 mg via INTRAVENOUS

## 2017-10-16 MED ORDER — LIDOCAINE HCL (CARDIAC) PF 100 MG/5ML IV SOSY
PREFILLED_SYRINGE | INTRAVENOUS | Status: DC | PRN
  Administered 2017-10-16: 50 mg via INTRAVENOUS

## 2017-10-16 MED ORDER — MIDAZOLAM HCL 2 MG/2ML IJ SOLN
INTRAMUSCULAR | Status: DC | PRN
  Administered 2017-10-16: 2 mg via INTRAVENOUS

## 2017-10-16 MED ORDER — PROPOFOL 10 MG/ML IV BOLUS
INTRAVENOUS | Status: AC
  Filled 2017-10-16: qty 20

## 2017-10-16 MED ORDER — PROPOFOL 10 MG/ML IV BOLUS
INTRAVENOUS | Status: DC | PRN
  Administered 2017-10-16 (×10): 20 mg via INTRAVENOUS

## 2017-10-16 MED ORDER — SODIUM CHLORIDE 0.9 % IV SOLN
INTRAVENOUS | Status: DC
  Administered 2017-10-16: 15:00:00 via INTRAVENOUS

## 2017-10-16 MED ORDER — ONDANSETRON HCL 4 MG/2ML IJ SOLN
INTRAMUSCULAR | Status: DC | PRN
  Administered 2017-10-16: 4 mg via INTRAVENOUS

## 2017-10-16 MED ORDER — PROPOFOL 500 MG/50ML IV EMUL
INTRAVENOUS | Status: DC | PRN
  Administered 2017-10-16: 150 ug/kg/min via INTRAVENOUS

## 2017-10-16 NOTE — Op Note (Signed)
[facility] Gastroenterology Patient Name: Natasha [name] Procedure Date: 10/16/2017 3:24 PM MRN: [id number] Account #: [id number] Date of Birth: 11/15/1985 Admit Type: Outpatient Age: [age] Room: [facility] ENDO ROOM 3 Gender: Female Note Status: Finalized Procedure:            Upper GI endoscopy Indications:          Dyspepsia, Gastro-esophageal reflux disease Providers:            Christena DeemMartin U. Truly Stankiewicz, MD Referring MD:         Daniel NonesKaren J. Behling, MD (Referring MD) Medicines:            Monitored Anesthesia Care Complications:        No immediate complications. Procedure:            Pre-Anesthesia Assessment:                       - ASA Grade Assessment: II - A patient with mild                        systemic disease.                       After obtaining informed consent, the endoscope was                        passed under direct vision. Throughout the procedure,                        the patient's blood pressure, pulse, and oxygen                        saturations were monitored continuously. The Endoscope                        was introduced through the mouth, and advanced to the                        third part of duodenum. The upper GI endoscopy was                        accomplished without difficulty. The patient tolerated                        the procedure. Findings:      The Z-line was regular. Biopsies were taken with a cold forceps for       histology.      Patchy mild inflammation characterized by congestion (edema), erosions       and erythema was found in the gastric body and in the gastric antrum.       Biopsies were taken with a cold forceps for histology. Biopsies were       taken with a cold forceps for Helicobacter pylori testing.      The cardia and gastric fundus were normal on retroflexion.      The examined duodenum was normal.      Well healed PEG site on the mid to distal anterior gastric wall. Impression:           - Z-line regular.  Biopsied.                       -  Gastritis. Biopsied.                       - Normal examined duodenum. Recommendation:       - Discharge patient to home.                       - Await pathology results.                       - Use Protonix (pantoprazole) 40 mg PO BID daily.                       - Use sucralfate tablets 1 gram PO QID for 2 months. Procedure Code(s):    --- Professional ---                       [phone], Esophagogastroduodenoscopy, flexible, transoral;                        with biopsy, single or multiple Diagnosis Code(s):    --- Professional ---                       K29.70, Gastritis, unspecified, without bleeding                       R10.13, Epigastric pain                       K21.9, Gastro-esophageal reflux disease without                        esophagitis CPT copyright 2017 American Medical Association. All rights reserved. The codes documented in this report are preliminary and upon coder review may  be revised to meet current compliance requirements. Christena Deem, MD 10/16/2017 4:23:15 PM This report has been signed electronically. Number of Addenda: 0 Note Initiated On: 10/16/2017 3:24 PM      [facility]

## 2017-10-16 NOTE — Transfer of Care (Signed)
Immediate Anesthesia Transfer of Care Note  Patient: Natasha [name]  Procedure(s) Performed: ESOPHAGOGASTRODUODENOSCOPY (EGD) WITH PROPOFOL (N/A )  Patient Location: PACU  Anesthesia Type:MAC  Level of Consciousness: awake  Airway & Oxygen Therapy: Patient Spontanous Breathing  Post-op Assessment: Report given to RN  Post vital signs: stable  Last Vitals:  Vitals Value Taken Time  BP 111/85 10/16/2017  4:08 PM  Temp 36.1 C 10/16/2017  4:08 PM  Pulse 79 10/16/2017  4:11 PM  Resp 21 10/16/2017  4:11 PM  SpO2 99 % 10/16/2017  4:11 PM  Vitals shown include unvalidated device data.  Last Pain:  Vitals:   10/16/17 1608  TempSrc: Tympanic  PainSc:          Complications: No apparent anesthesia complications

## 2017-10-16 NOTE — Anesthesia Preprocedure Evaluation (Signed)
Anesthesia Evaluation  Patient identified by MRN, date of birth, ID band Patient awake    Reviewed: Allergy & Precautions, H&P , NPO status , Patient's Chart, lab work & pertinent test results, reviewed documented beta blocker date and time   History of Anesthesia Complications Negative for: history of anesthetic complications  Airway Mallampati: II  TM Distance: >3 FB Neck ROM: full    Dental  (+) Partial Upper, Missing, Dental Advidsory Given, Poor Dentition   Pulmonary neg shortness of breath, neg sleep apnea, neg COPD, neg recent URI, Current Smoker,           Cardiovascular Exercise Tolerance: Good negative cardio ROS       Neuro/Psych PSYCHIATRIC DISORDERS Anxiety Depression negative neurological ROS     GI/Hepatic Neg liver ROS, GERD  ,  Endo/Other  negative endocrine ROS  Renal/GU Renal disease (kidney stones)  negative genitourinary   Musculoskeletal   Abdominal   Peds  Hematology negative hematology ROS (+)   Anesthesia Other Findings Past Medical History: No date: ADD (attention deficit disorder) No date: Anxiety No date: Depression No date: GERD (gastroesophageal reflux disease) No date: Impaired functional mobility, balance, gait, and endurance No date: Mild obesity No date: Mixed receptive-expressive language disorder No date: Narcotic dependence (HCC) No date: Polysubstance abuse (HCC) unk: Restless leg syndrome No date: RLS (restless legs syndrome) No date: Salivary gland swelling No date: Substance abuse (HCC) No date: Tobacco abuse No date: Traumatic brain injury (HCC)   Reproductive/Obstetrics negative OB ROS                             Anesthesia Physical Anesthesia Plan  ASA: II  Anesthesia Plan: General   Post-op Pain Management:    Induction: Intravenous  PONV Risk Score and Plan: 2 and Propofol infusion  Airway Management Planned: Nasal  Cannula  Additional Equipment:   Intra-op Plan:   Post-operative Plan:   Informed Consent: I have reviewed the patients History and Physical, chart, labs and discussed the procedure including the risks, benefits and alternatives for the proposed anesthesia with the patient or authorized representative who has indicated his/her understanding and acceptance.   Dental Advisory Given  Plan Discussed with: Anesthesiologist, CRNA and Surgeon  Anesthesia Plan Comments:         Anesthesia Quick Evaluation

## 2017-10-16 NOTE — Anesthesia Postprocedure Evaluation (Signed)
Anesthesia Post Note  Patient: Natasha [name]  Procedure(s) Performed: ESOPHAGOGASTRODUODENOSCOPY (EGD) WITH PROPOFOL (N/A )  Patient location during evaluation: Endoscopy Anesthesia Type: General Level of consciousness: awake and alert Pain management: pain level controlled Vital Signs Assessment: post-procedure vital signs reviewed and stable Respiratory status: spontaneous breathing, nonlabored ventilation, respiratory function stable and patient connected to nasal cannula oxygen Cardiovascular status: blood pressure returned to baseline and stable Postop Assessment: no apparent nausea or vomiting Anesthetic complications: no     Last Vitals:  Vitals:   10/16/17 1618 10/16/17 1628  BP: (!) 125/97 124/80  Pulse:    Resp:    Temp:    SpO2:      Last Pain:  Vitals:   10/16/17 1638  TempSrc:   PainSc: 0-No pain                 Lenard SimmerAndrew Whittaker Lenis
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
## 2017-10-16 NOTE — Anesthesia Post-op Follow-up Note (Signed)
Anesthesia QCDR form completed.        

## 2017-10-19 ENCOUNTER — Encounter: Payer: Self-pay | Admitting: Physical Therapy

## 2017-10-19 ENCOUNTER — Ambulatory Visit: Payer: Commercial Managed Care - HMO | Admitting: Physical Therapy

## 2017-10-19 DIAGNOSIS — M6281 Muscle weakness (generalized): Secondary | ICD-10-CM | POA: Diagnosis not present

## 2017-10-19 DIAGNOSIS — R262 Difficulty in walking, not elsewhere classified: Secondary | ICD-10-CM

## 2017-10-19 DIAGNOSIS — R2681 Unsteadiness on feet: Secondary | ICD-10-CM

## 2017-10-19 DIAGNOSIS — R278 Other lack of coordination: Secondary | ICD-10-CM
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
## 2017-10-20 LAB — SURGICAL PATHOLOGY

## 2017-10-22 ENCOUNTER — Encounter: Payer: Self-pay | Admitting: Physical Therapy

## 2017-10-22 ENCOUNTER — Ambulatory Visit: Payer: Commercial Managed Care - HMO | Admitting: Occupational Therapy

## 2017-10-22 ENCOUNTER — Ambulatory Visit: Payer: Commercial Managed Care - HMO | Admitting: Physical Therapy

## 2017-10-22 DIAGNOSIS — M6281 Muscle weakness (generalized): Secondary | ICD-10-CM | POA: Diagnosis not present

## 2017-10-22 DIAGNOSIS — R29810 Facial weakness: Secondary | ICD-10-CM

## 2017-10-22 DIAGNOSIS — R278 Other lack of coordination: Secondary | ICD-10-CM

## 2017-10-22 DIAGNOSIS — R4189 Other symptoms and signs involving cognitive functions and awareness: Secondary | ICD-10-CM

## 2017-10-22 DIAGNOSIS — Z8669 Personal history of other diseases of the nervous system and sense organs: Secondary | ICD-10-CM

## 2017-10-22 DIAGNOSIS — R262 Difficulty in walking, not elsewhere classified: Secondary | ICD-10-CM

## 2017-10-22 DIAGNOSIS — R2681 Unsteadiness on feet: Secondary | ICD-10-CM

## 2017-10-22 NOTE — Therapy (Signed)
[facility] MAIN [facility] SERVICES [address] [place], [place], [address] Phone: [phone]   Fax:  [phone]  Occupational Therapy Treatment  Patient Details  Name: Natasha [name] MRN: [id number] Date of Birth: 1985-10-03 Referring Provider: Richardine Service   Encounter Date: 10/22/2017  OT End of Session - 10/22/17 0936    Visit Number  9    Number of Visits  24    Date for OT Re-Evaluation  01/05/18    Authorization Type  8 of 23 visits per calendar year for 2019.    OT Start Time  0930    OT Stop Time  1015    OT Time Calculation (min)  45 min    Equipment Utilized During Treatment  Pink theraputty    Activity Tolerance  Patient tolerated treatment well    Behavior During Therapy  WFL for tasks assessed/performed       Past Medical History:  Diagnosis Date  . ADD (attention deficit disorder)   . Anxiety   . Depression   . GERD (gastroesophageal reflux disease)   . Impaired functional mobility, balance, gait, and endurance   . Mild obesity   . Mixed receptive-expressive language disorder   . Narcotic dependence (HCC)   . Polysubstance abuse (HCC)   . Restless leg syndrome unk  . RLS (restless legs syndrome)   . Salivary gland swelling   . Substance abuse (HCC)   . Tobacco abuse   . Traumatic brain injury [facility])     Past Surgical History:  Procedure Laterality Date  . CHOLECYSTECTOMY    . ESOPHAGOGASTRODUODENOSCOPY    . ESOPHAGOGASTRODUODENOSCOPY (EGD) WITH PROPOFOL N/A 10/16/2017   Procedure: ESOPHAGOGASTRODUODENOSCOPY (EGD) WITH PROPOFOL;  Surgeon: Christena Deem, MD;  Location: [facility] ENDOSCOPY;  Service: Endoscopy;  Laterality: N/A;  . ESOPHAGOSCOPY W/ PERCUTANEOUS GASTROSTOMY TUBE PLACEMENT    . LITHOTRIPSY    . TUBAL LIGATION    . URETERAL STENT PLACEMENT      There were no vitals filed for this visit.  OT TREATMENT    Cognitive:  Pt. worked on recalling, and listing series of 5 items in various catagories. Pt.  worked on the task while being challenged with distractions. Pt. worked on Soil scientist, and Film/video editor. Pt. had difficulty with recall, visual memory, and sequencing skills.                                   OT Long Term Goals - 10/13/17 4401      OT LONG TERM GOAL #1   Title  Pt. will improved typing speed and accuracy by 10 wpm for basic computer use.    Baseline  Eval:14 wpm. 10/13/2017: 5 min. typing test: speed: 18wpm, Accuracy: 95% Pt. was approved for 12 medicaid visits, and has used 2 which were used for [employer] assessment, and [employer] Assessment of [place].     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    Baseline  10/13/2017: Pt. 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    Time  12    Period  Weeks    Status  New    Target Date  01/05/18      OT LONG TERM GOAL #3   Title  Pt. will improve STM to be able to independently take messages, and perfrom work related tasks.    Baseline  Pt. presents with impaired Visual Memeory, and Auditory memory as evidence by the Cognitive [place]. Pt. scored 4/7 on forward visual memory and sequencing, 2/7 for backward visual memeory and sequencing, 5/7 for forward auditory memeory and sequencing, and 4/7 for backward auditory memory and sequencing which hinder accuracy and efficiency of message taking, and performing office work related tasks.     Time  12    Period  Weeks    Target Date  01/05/18      OT LONG TERM GOAL #4   Title  Pt. will independently demonstrate cognitive compensatory strategies for ADLs, and IADLs    Baseline   10/13/2017: Pt. education has been provided about cognitive compensatory strategies during ADLs, and IADLs. Pt. presents with cognitive imapirments with visual memory and sequencing, auditory memory, and sequencing, multiple digit simple math skills, and foresight, and planning.    Time  12    Period  Weeks    Status  On-going    Target Date  01/05/18            Plan - 10/22/17 0950    Clinical Impression Statement  Pt. continues to work on improving visual, and auditory memory, and sequencing skills needed in prerparation for work related tasks. Pt. education was provided about visual, and compensatory strategies for thes tasks.     Occupational performance deficits (Please refer to evaluation for details):  ADL's;IADL's    Rehab Potential  Good    OT Frequency  2x / week    OT Duration  12 weeks    OT Treatment/Interventions  Self-care/ADL training;Therapeutic exercise;Moist Heat;Neuromuscular education;DME and/or AE instruction;Therapeutic activities;Patient/family education;Cognitive remediation/compensation    Plan  To work on meal planning, meal preparation, and IADLs.    Clinical Decision Making  Several treatment options, min-mod task modification necessary    OT Home Exercise Plan  Pink theraputty HEP from "HEP 2 GO" program    Consulted and Agree with Plan of Care  Patient    Family Member Consulted  mom       Patient will benefit from skilled therapeutic intervention in order to improve the following deficits and impairments:  Decreased cognition, Decreased knowledge of use of DME, Decreased coordination, Decreased mobility, Decreased endurance, Decreased strength, Decreased balance, Decreased safety awareness, Decreased knowledge of precautions, Impaired UE functional use  Visit Diagnosis: Muscle weakness (generalized)  Other lack of coordination    Problem List There are no active problems to display for this patient.   Olegario MessierElaine Benji Poynter, MS, OTR/L 10/22/2017,  10:01 AM  [facility] [address] , [place], [address] Phone: [phone]   Fax:  [phone]  Name: Natasha [name] MRN: [id number] Date of Birth: 08/11/1985
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
## 2017-10-23 ENCOUNTER — Other Ambulatory Visit: Payer: Self-pay | Admitting: Pediatrics

## 2017-10-23 DIAGNOSIS — S069X9D Unspecified intracranial injury with loss of consciousness of unspecified duration, subsequent encounter: Secondary | ICD-10-CM

## 2017-10-26 ENCOUNTER — Encounter: Payer: Self-pay | Admitting: Physical Therapy

## 2017-10-26 ENCOUNTER — Ambulatory Visit: Payer: Commercial Managed Care - HMO | Admitting: Physical Therapy

## 2017-10-26 ENCOUNTER — Ambulatory Visit: Payer: Commercial Managed Care - HMO | Attending: Family Medicine | Admitting: Occupational Therapy

## 2017-10-26 ENCOUNTER — Encounter: Payer: Self-pay | Admitting: Occupational Therapy

## 2017-10-26 DIAGNOSIS — R278 Other lack of coordination: Secondary | ICD-10-CM

## 2017-10-26 DIAGNOSIS — R262 Difficulty in walking, not elsewhere classified: Secondary | ICD-10-CM | POA: Insufficient documentation

## 2017-10-26 DIAGNOSIS — M6281 Muscle weakness (generalized): Secondary | ICD-10-CM

## 2017-10-26 DIAGNOSIS — R2681 Unsteadiness on feet: Secondary | ICD-10-CM | POA: Insufficient documentation

## 2017-10-26 DIAGNOSIS — R41841 Cognitive communication deficit: Secondary | ICD-10-CM | POA: Insufficient documentation

## 2017-10-26 DIAGNOSIS — R4189 Other symptoms and signs involving cognitive functions and awareness: Secondary | ICD-10-CM | POA: Diagnosis present

## 2017-10-26 DIAGNOSIS — R29810 Facial weakness: Secondary | ICD-10-CM | POA: Diagnosis present
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
## 2017-10-27 ENCOUNTER — Other Ambulatory Visit: Payer: Self-pay | Admitting: Pediatrics

## 2017-10-27 DIAGNOSIS — R1031 Right lower quadrant pain: Secondary | ICD-10-CM

## 2017-10-27 DIAGNOSIS — S069X9D Unspecified intracranial injury with loss of consciousness of unspecified duration, subsequent encounter: Secondary | ICD-10-CM

## 2017-10-27 DIAGNOSIS — M549 Dorsalgia, unspecified: Secondary | ICD-10-CM

## 2017-10-28 ENCOUNTER — Ambulatory Visit
Admission: RE | Admit: 2017-10-28 | Discharge: 2017-10-28 | Disposition: A | Discharge status: Home / Self Care | Payer: Commercial Managed Care - HMO | Source: Ambulatory Visit | Attending: Pediatrics | Admitting: Pediatrics

## 2017-10-28 DIAGNOSIS — N200 Calculus of kidney: Secondary | ICD-10-CM | POA: Diagnosis not present

## 2017-10-28 DIAGNOSIS — R1031 Right lower quadrant pain: Secondary | ICD-10-CM

## 2017-10-28 DIAGNOSIS — M549 Dorsalgia, unspecified: Secondary | ICD-10-CM | POA: Diagnosis not present

## 2017-10-28 DIAGNOSIS — K429 Umbilical hernia without obstruction or gangrene: Secondary | ICD-10-CM | POA: Insufficient documentation

## 2017-10-28 DIAGNOSIS — S069X9D Unspecified intracranial injury with loss of consciousness of unspecified duration, subsequent encounter: Secondary | ICD-10-CM | POA: Insufficient documentation

## 2017-10-28 DIAGNOSIS — K760 Fatty (change of) liver, not elsewhere classified: Secondary | ICD-10-CM | POA: Insufficient documentation

## 2017-11-02 ENCOUNTER — Ambulatory Visit: Payer: Commercial Managed Care - HMO | Admitting: Occupational Therapy

## 2017-11-02 ENCOUNTER — Encounter: Payer: Self-pay | Admitting: Occupational Therapy

## 2017-11-02 ENCOUNTER — Ambulatory Visit: Payer: Commercial Managed Care - HMO

## 2017-11-02 DIAGNOSIS — R262 Difficulty in walking, not elsewhere classified: Secondary | ICD-10-CM

## 2017-11-02 DIAGNOSIS — M6281 Muscle weakness (generalized): Secondary | ICD-10-CM

## 2017-11-02 DIAGNOSIS — R278 Other lack of coordination: Secondary | ICD-10-CM

## 2017-11-02 DIAGNOSIS — R41841 Cognitive communication deficit: Secondary | ICD-10-CM | POA: Diagnosis not present
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
## 2017-11-04 ENCOUNTER — Ambulatory Visit: Payer: 59 | Admitting: Physical Therapy

## 2017-11-09 ENCOUNTER — Encounter: Payer: 59 | Admitting: Occupational Therapy

## 2017-11-09 ENCOUNTER — Ambulatory Visit: Payer: 59 | Admitting: Physical Therapy

## 2017-11-11 ENCOUNTER — Ambulatory Visit: Payer: 59 | Admitting: Physical Therapy

## 2017-11-18 ENCOUNTER — Ambulatory Visit: Payer: Commercial Managed Care - HMO | Admitting: Physical Therapy

## 2017-12-02 ENCOUNTER — Ambulatory Visit: Payer: Commercial Managed Care - HMO | Attending: Family Medicine | Admitting: Physical Therapy

## 2017-12-02 ENCOUNTER — Encounter: Payer: Self-pay | Admitting: Physical Therapy

## 2017-12-02 DIAGNOSIS — R278 Other lack of coordination: Secondary | ICD-10-CM | POA: Diagnosis present

## 2017-12-02 DIAGNOSIS — M6281 Muscle weakness (generalized): Secondary | ICD-10-CM

## 2017-12-02 DIAGNOSIS — R262 Difficulty in walking, not elsewhere classified: Secondary | ICD-10-CM | POA: Diagnosis present

## 2017-12-02 DIAGNOSIS — R2681 Unsteadiness on feet: Secondary | ICD-10-CM | POA: Diagnosis present
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
## 2017-12-04 ENCOUNTER — Ambulatory Visit: Payer: Self-pay | Admitting: Urology

## 2017-12-10 DIAGNOSIS — F900 Attention-deficit hyperactivity disorder, predominantly inattentive type: Secondary | ICD-10-CM | POA: Insufficient documentation

## 2017-12-10 NOTE — Progress Notes (Signed)
12/11/2017 12:28 PM   Natasha ShadeWhitney B Keating 10/06/1985 [id number]  Referring provider: Orene DesanctisBehling, Karen, MD [address] [place] [address] [place], [place] [address]  Chief Complaint  Patient presents with  . Nephrolithiasis    New Patient    HPI: Patient is a 32 year old Caucasian female who was referred by Dr. Orene DesanctisKaren Behling for nephrolithiasis.   She presented to Dr. Richardine ServiceBehling for the complaint of right lower quadrant pain that radiates to her right buttocks.  Non contrast CT on 10/28/2017 noted multiple punctate nonobstructing left renal stones. No renal stones on the right. No ureteral stones or hydronephrosis. Urinary bladder and adrenal glands unremarkable.  Fatty liver.  Small umbilical hernia containing fat.  No acute findings in the abdomen or pelvis.    Today, she is having the right-sided lower quadrant pain that is radiating to her right buttocks.  She states that this is been going on for the last 2 weeks.  She describes it as a 5 out of 6 pain.  She states the pain is constant.  She states having intercourse makes the pain worse.  She has not noted anything that relieves the pain.    She states that she has an increase in frequency, nocturia and stress incontinence.  She states that her urinary symptoms are mild and she is only using a panty liner to control her incontinence.  Specifically she is not having left-sided flank pain or gross hematuria.  Patient denies any dysuria or suprapubic/flank pain.  Patient denies any fevers, chills, nausea or vomiting.   Her UA is negative.  Her PVR is 62 mL.    PMH: Past Medical History:  Diagnosis Date  . ADD (attention deficit disorder)   . Anxiety   . Depression   . GERD (gastroesophageal reflux disease)   . Impaired functional mobility, balance, gait, and endurance   . Mild obesity   . Mixed receptive-expressive language disorder   . Narcotic dependence (HCC)   . Polysubstance abuse (HCC)   . Restless leg syndrome unk  . RLS (restless legs  syndrome)   . Salivary gland swelling   . Substance abuse (HCC)   . Tobacco abuse   . Traumatic brain injury [facility](HCC)     Surgical History: Past Surgical History:  Procedure Laterality Date  . CHOLECYSTECTOMY    . ESOPHAGOGASTRODUODENOSCOPY    . ESOPHAGOGASTRODUODENOSCOPY (EGD) WITH PROPOFOL N/A 10/16/2017   Procedure: ESOPHAGOGASTRODUODENOSCOPY (EGD) WITH PROPOFOL;  Surgeon: Christena DeemSkulskie, Martin U, MD;  [facility];  Service: Endoscopy;  Laterality: N/A;  . ESOPHAGOSCOPY W/ PERCUTANEOUS GASTROSTOMY TUBE PLACEMENT    . LITHOTRIPSY    . TUBAL LIGATION    . URETERAL STENT PLACEMENT      Home Medications:  Allergies as of 12/11/2017      Reactions   Clioquinol    Codeine    Toradol [ketorolac Tromethamine]       Medication List        Accurate as of 12/11/17 12:27 PM. Always use your most recent med list.          busPIRone 10 MG tablet Commonly known as:  BUSPAR Take 20 mg by mouth 2 (two) times daily. Reported on 06/14/2015   DEXILANT 60 MG capsule Generic drug:  dexlansoprazole TK ONE C PO ONCE D   ranitidine 150 MG tablet Commonly known as:  ZANTAC Take 150 mg by mouth 2 (two) times daily.   sertraline 100 MG tablet Commonly known as:  ZOLOFT Take 150 mg by mouth  daily.       Allergies:  Allergies  Allergen Reactions  . Clioquinol   . Codeine   . Toradol [Ketorolac Tromethamine]     Family History: Family History  Problem Relation Age of Onset  . Cancer Father     Social History:  reports that she has been smoking cigarettes. She has been smoking about 1.00 pack per day. She has never used smokeless tobacco. She reports that she has current or past drug history. Drug: Marijuana. She reports that she does not drink alcohol.  ROS: UROLOGY Frequent Urination?: Yes Hard to postpone urination?: No Burning/pain with urination?: No Get up at night to urinate?: Yes Leakage of urine?: Yes Urine stream starts and stops?: No Trouble starting  stream?: No Do you have to strain to urinate?: No Blood in urine?: No Urinary tract infection?: No Sexually transmitted disease?: No Injury to kidneys or bladder?: No Painful intercourse?: No Weak stream?: No Currently pregnant?: No Vaginal bleeding?: No Last menstrual period?: n  Gastrointestinal Nausea?: No Vomiting?: No Indigestion/heartburn?: Yes Diarrhea?: No Constipation?: No  Constitutional Fever: No Night sweats?: No Weight loss?: Yes Fatigue?: Yes  Skin Skin rash/lesions?: No Itching?: Yes  Eyes Blurred vision?: No Double vision?: No  Ears/Nose/Throat Sore throat?: Yes Sinus problems?: Yes  Hematologic/Lymphatic Swollen glands?: No Easy bruising?: No  Cardiovascular Leg swelling?: No Chest pain?: Yes  Respiratory Cough?: Yes Shortness of breath?: No  Endocrine Excessive thirst?: Yes  Musculoskeletal Back pain?: Yes Joint pain?: Yes  Neurological Headaches?: Yes Dizziness?: No  Psychologic Depression?: Yes Anxiety?: Yes  Physical Exam: BP 129/75   Pulse 80   Ht 5\' 5"  (1.651 m)   Wt 209 lb (94.8 kg)   BMI 34.78 kg/m   Constitutional:  Well nourished. Alert and oriented, No acute distress. HEENT: [place] AT, moist mucus membranes.  Trachea midline, no masses. Cardiovascular: No clubbing, cyanosis, or edema. Respiratory: Normal respiratory effort, no increased work of breathing. GI: Abdomen is soft, non tender, non distended, no abdominal masses. Liver and spleen not palpable.  No hernias appreciated.  Stool sample for occult testing is not indicated.   GU: No CVA tenderness.  No bladder fullness or masses.  Normal external genitalia, normal pubic hair distribution, no lesions.  Normal urethral meatus, no lesions, no prolapse, no discharge.   No urethral masses, tenderness and/or tenderness. No bladder fullness, tenderness or masses. Normal vagina mucosa, good estrogen effect, no discharge, no lesions, good pelvic support, grade I cystocele,  no rectocele noted.  No cervical motion tenderness.  Uterus is freely mobile and non-fixed.  No adnexal/parametria masses are noted on the left.  Her right adnexa/parametria without masses, but tender to palpation.  Anus and perineum are without rashes or lesions.    Skin: No rashes, bruises or suspicious lesions. Lymph: No cervical or inguinal adenopathy. Neurologic: Grossly intact, no focal deficits, moving all 4 extremities.  Uses a cane to walk.   Psychiatric: Normal mood and affect.  Laboratory Data: Lab Results  Component Value Date   WBC 7.0 10/03/2014   HGB 13.7 10/03/2014   HCT 40.6 10/03/2014   MCV 92.9 10/03/2014   PLT 192 10/03/2014    Lab Results  Component Value Date   CREATININE 0.81 10/03/2014    No results found for: PSA  No results found for: TESTOSTERONE  No results found for: HGBA1C  No results found for: TSH  No results found for: CHOL, HDL, CHOLHDL, VLDL, LDLCALC  Lab Results  Component Value Date  AST 18 10/03/2014   Lab Results  Component Value Date   ALT 12 (L) 10/03/2014   No components found for: ALKALINEPHOPHATASE No components found for: BILIRUBINTOTAL  No results found for: ESTRADIOL  Urinalysis    Component Value Date/Time   COLORURINE STRAW (A) 12/11/2017 1123   APPEARANCEUR CLEAR 12/11/2017 1123   APPEARANCEUR Hazy 05/16/2014 1508   LABSPEC <1.005 (L) 12/11/2017 1123   LABSPEC 1.025 05/16/2014 1508   PHURINE 5.5 12/11/2017 1123   GLUCOSEU NEGATIVE 12/11/2017 1123   GLUCOSEU Negative 05/16/2014 1508   HGBUR NEGATIVE 12/11/2017 1123   BILIRUBINUR NEGATIVE 12/11/2017 1123   BILIRUBINUR Negative 05/16/2014 1508   KETONESUR NEGATIVE 12/11/2017 1123   PROTEINUR NEGATIVE 12/11/2017 1123   NITRITE NEGATIVE 12/11/2017 1123   LEUKOCYTESUR NEGATIVE 12/11/2017 1123   LEUKOCYTESUR Negative 05/16/2014 1508    I have reviewed the labs.   Pertinent Imaging: CLINICAL DATA:  Right flank pain, abdominal pain  EXAM: CT ABDOMEN AND  PELVIS WITHOUT CONTRAST  TECHNIQUE: Multidetector CT imaging of the abdomen and pelvis was performed following the standard protocol without IV contrast.  COMPARISON:  05/16/2014  FINDINGS: Lower chest: Lung bases are clear. No effusions. Heart is normal size.  Hepatobiliary: Diffuse low-density throughout the liver compatible with fatty infiltration. No focal abnormality. Prior cholecystectomy.  Pancreas: No focal abnormality or ductal dilatation.  Spleen: No focal abnormality.  Normal size.  Adrenals/Urinary Tract: Multiple punctate nonobstructing left renal stones. No renal stones on the right. No ureteral stones or hydronephrosis. Urinary bladder and adrenal glands unremarkable.  Stomach/Bowel: Appendix is normal. Few scattered sigmoid diverticula. No diverticulitis. Stomach and small bowel decompressed, unremarkable.  Vascular/Lymphatic: No evidence of aneurysm or adenopathy.  Reproductive: Uterus and adnexa unremarkable.  No mass.  Other: No free fluid or free air. Small umbilical hernia containing fat.  Musculoskeletal: No acute bony abnormality.  IMPRESSION: See left nephrolithiasis. No ureteral stones or hydronephrosis bilaterally.  Fatty liver.  Small umbilical hernia containing fat.  No acute findings in the abdomen or pelvis.   Electronically Signed   By: Charlett NoseKevin  Dover M.D.   On: 10/28/2017 13:05  I have independently reviewed the films.    Assessment & Plan:    1. Right quadrant pain No right renal stones, right ureteral stones or right hydronephrosis seen on CT  UA is negative Follow back up with Dr. Richardine ServiceBehling for further evaluation of the pain  2. Left renal stones Not the cause of her right lower quadrant pain Continue conservative management  Advised to contact our office or seek treatment in the ED if she starts experiencing left-sided flank pain, gross hematuria or nausea/vomiting in order to arrange for  emergent/urgent intervention as 1 of her punctate left renal stones may have migrated into her ureter Return to clinic in 6 months for KUB and office visit  3. SUI Offered further evaluation and management of stress incontinence, but not bothersome to the patient at this time as it is minimal and she is managing it with small liners  Return in about 6 months (around 06/13/2018) for KUB and offuce .  These notes generated with voice recognition software. I apologize for typographical errors.  Michiel CowboySHANNON Camillo Quadros, PA-C  [facility] [address]  [address] [place], [place] [address] [phone]

## 2017-12-11 ENCOUNTER — Ambulatory Visit (INDEPENDENT_AMBULATORY_CARE_PROVIDER_SITE_OTHER): Payer: 59 | Admitting: Urology

## 2017-12-11 ENCOUNTER — Other Ambulatory Visit
Admission: RE | Admit: 2017-12-11 | Discharge: 2017-12-11 | Disposition: A | Discharge status: Home / Self Care | Payer: Commercial Managed Care - HMO | Source: Ambulatory Visit | Attending: Urology | Admitting: Urology

## 2017-12-11 ENCOUNTER — Encounter: Payer: Self-pay | Admitting: Urology

## 2017-12-11 VITALS — BP 129/75 | HR 80 | Ht 65.0 in | Wt 209.0 lb

## 2017-12-11 DIAGNOSIS — R1031 Right lower quadrant pain: Secondary | ICD-10-CM

## 2017-12-11 DIAGNOSIS — N200 Calculus of kidney: Secondary | ICD-10-CM

## 2017-12-11 DIAGNOSIS — N393 Stress incontinence (female) (male): Secondary | ICD-10-CM | POA: Diagnosis not present

## 2017-12-11 LAB — URINALYSIS, COMPLETE (UACMP) WITH MICROSCOPIC
BACTERIA UA: NONE SEEN
Bilirubin Urine: NEGATIVE
GLUCOSE, UA: NEGATIVE mg/dL
Hgb urine dipstick: NEGATIVE
KETONES UR: NEGATIVE mg/dL
LEUKOCYTES UA: NEGATIVE
Nitrite: NEGATIVE
PH: 5.5 (ref 5.0–8.0)
Protein, ur: NEGATIVE mg/dL
RBC / HPF: NONE SEEN RBC/hpf (ref 0–5)
WBC, UA: NONE SEEN WBC/hpf (ref 0–5)

## 2017-12-11 LAB — BLADDER SCAN AMB NON-IMAGING

## 2018-04-16 ENCOUNTER — Other Ambulatory Visit: Payer: Self-pay | Admitting: Pediatrics

## 2018-04-16 DIAGNOSIS — N6320 Unspecified lump in the left breast, unspecified quadrant: Secondary | ICD-10-CM

## 2018-04-16 DIAGNOSIS — N6452 Nipple discharge: Secondary | ICD-10-CM

## 2018-05-24 ENCOUNTER — Other Ambulatory Visit: Payer: Self-pay | Admitting: Pediatrics

## 2018-05-24 DIAGNOSIS — N6320 Unspecified lump in the left breast, unspecified quadrant: Secondary | ICD-10-CM

## 2018-05-24 DIAGNOSIS — N6452 Nipple discharge: Secondary | ICD-10-CM

## 2018-06-09 ENCOUNTER — Other Ambulatory Visit: Payer: Commercial Managed Care - HMO

## 2018-06-17 ENCOUNTER — Ambulatory Visit: Payer: 59 | Admitting: Urology

## 2018-06-21 ENCOUNTER — Ambulatory Visit: Payer: 59 | Admitting: Urology

## 2018-06-23 ENCOUNTER — Ambulatory Visit
Admission: RE | Admit: 2018-06-23 | Discharge: 2018-06-23 | Disposition: A | Discharge status: Home / Self Care | Payer: 59 | Source: Ambulatory Visit | Attending: Pediatrics | Admitting: Pediatrics

## 2018-06-23 DIAGNOSIS — N6452 Nipple discharge: Secondary | ICD-10-CM

## 2018-06-23 DIAGNOSIS — N6320 Unspecified lump in the left breast, unspecified quadrant: Secondary | ICD-10-CM

## 2018-10-11 ENCOUNTER — Emergency Department: Payer: 59

## 2018-10-11 ENCOUNTER — Encounter: Payer: Self-pay | Admitting: Emergency Medicine

## 2018-10-11 ENCOUNTER — Emergency Department
Admission: EM | Admit: 2018-10-11 | Discharge: 2018-10-12 | Disposition: A | Discharge status: Other Acute Inpatient Hospital | Payer: 59 | Attending: Emergency Medicine | Admitting: Emergency Medicine

## 2018-10-11 ENCOUNTER — Other Ambulatory Visit: Payer: Self-pay

## 2018-10-11 DIAGNOSIS — R109 Unspecified abdominal pain: Secondary | ICD-10-CM | POA: Diagnosis not present

## 2018-10-11 DIAGNOSIS — Z87820 Personal history of traumatic brain injury: Secondary | ICD-10-CM | POA: Diagnosis not present

## 2018-10-11 DIAGNOSIS — F332 Major depressive disorder, recurrent severe without psychotic features: Secondary | ICD-10-CM | POA: Insufficient documentation

## 2018-10-11 DIAGNOSIS — F900 Attention-deficit hyperactivity disorder, predominantly inattentive type: Secondary | ICD-10-CM | POA: Insufficient documentation

## 2018-10-11 DIAGNOSIS — F17210 Nicotine dependence, cigarettes, uncomplicated: Secondary | ICD-10-CM | POA: Diagnosis not present

## 2018-10-11 DIAGNOSIS — F329 Major depressive disorder, single episode, unspecified: Secondary | ICD-10-CM

## 2018-10-11 DIAGNOSIS — F419 Anxiety disorder, unspecified: Secondary | ICD-10-CM | POA: Insufficient documentation

## 2018-10-11 DIAGNOSIS — F4321 Adjustment disorder with depressed mood: Secondary | ICD-10-CM | POA: Insufficient documentation

## 2018-10-11 DIAGNOSIS — R45851 Suicidal ideations: Secondary | ICD-10-CM | POA: Insufficient documentation

## 2018-10-11 DIAGNOSIS — Z79899 Other long term (current) drug therapy: Secondary | ICD-10-CM | POA: Insufficient documentation

## 2018-10-11 DIAGNOSIS — Z03818 Encounter for observation for suspected exposure to other biological agents ruled out: Secondary | ICD-10-CM | POA: Insufficient documentation

## 2018-10-11 DIAGNOSIS — F32A Depression, unspecified: Secondary | ICD-10-CM

## 2018-10-11 DIAGNOSIS — Z008 Encounter for other general examination: Secondary | ICD-10-CM | POA: Diagnosis present

## 2018-10-11 LAB — URINE DRUG SCREEN, QUALITATIVE (ARMC ONLY)
Amphetamines, Ur Screen: NOT DETECTED
Barbiturates, Ur Screen: NOT DETECTED
Benzodiazepine, Ur Scrn: NOT DETECTED
Cannabinoid 50 Ng, Ur ~~LOC~~: NOT DETECTED
Cocaine Metabolite,Ur ~~LOC~~: NOT DETECTED
MDMA (Ecstasy)Ur Screen: NOT DETECTED
Methadone Scn, Ur: NOT DETECTED
Opiate, Ur Screen: NOT DETECTED
Phencyclidine (PCP) Ur S: NOT DETECTED
Tricyclic, Ur Screen: NOT DETECTED

## 2018-10-11 LAB — COMPREHENSIVE METABOLIC PANEL
ALT: 13 U/L (ref 0–44)
AST: 15 U/L (ref 15–41)
Albumin: 4.9 g/dL (ref 3.5–5.0)
Alkaline Phosphatase: 58 U/L (ref 38–126)
Anion gap: 9 (ref 5–15)
BUN: 16 mg/dL (ref 6–20)
CO2: 26 mmol/L (ref 22–32)
Calcium: 10.2 mg/dL (ref 8.9–10.3)
Chloride: 102 mmol/L (ref 98–111)
Creatinine, Ser: 0.88 mg/dL (ref 0.44–1.00)
GFR calc Af Amer: >60 mL/min (ref >60)
GFR calc non Af Amer: >60 mL/min (ref >60)
Glucose, Bld: 124 mg/dL — ABNORMAL HIGH (ref 70–99)
Potassium: 4.1 mmol/L (ref 3.5–5.1)
Sodium: 137 mmol/L (ref 135–145)
Total Bilirubin: 0.3 mg/dL (ref 0.3–1.2)
Total Protein: 7.9 g/dL (ref 6.5–8.1)

## 2018-10-11 LAB — URINALYSIS, COMPLETE (UACMP) WITH MICROSCOPIC
Bilirubin Urine: NEGATIVE
Glucose, UA: NEGATIVE mg/dL
Ketones, ur: NEGATIVE mg/dL
Nitrite: NEGATIVE
Protein, ur: 30 mg/dL — AB
RBC / HPF: >50 RBC/hpf — ABNORMAL HIGH (ref 0–5)
Specific Gravity, Urine: 1.018 (ref 1.005–1.030)
pH: 5 (ref 5.0–8.0)

## 2018-10-11 LAB — CBC
HCT: 37.8 % (ref 36.0–46.0)
Hemoglobin: 12.3 g/dL (ref 12.0–15.0)
MCH: 28 pg (ref 26.0–34.0)
MCHC: 32.5 g/dL (ref 30.0–36.0)
MCV: 85.9 fL (ref 80.0–100.0)
Platelets: 215 10*3/uL (ref 150–400)
RBC: 4.4 MIL/uL (ref 3.87–5.11)
RDW: 13 % (ref 11.5–15.5)
WBC: 6.4 10*3/uL (ref 4.0–10.5)
nRBC: 0 % (ref 0.0–0.2)

## 2018-10-11 LAB — SALICYLATE LEVEL: Salicylate Lvl: <7 mg/dL (ref 2.8–30.0)

## 2018-10-11 LAB — ACETAMINOPHEN LEVEL: Acetaminophen (Tylenol), Serum: <10 ug/mL — ABNORMAL LOW (ref 10–30)

## 2018-10-11 LAB — ETHANOL: Alcohol, Ethyl (B): <10 mg/dL (ref <10)

## 2018-10-11 LAB — TROPONIN I: Troponin I: <0.03 ng/mL (ref <0.03)

## 2018-10-11 LAB — PREGNANCY, URINE: Preg Test, Ur: NEGATIVE

## 2018-10-11 MED ORDER — FAMOTIDINE 20 MG PO TABS
40.0000 mg | ORAL_TABLET | Freq: Two times a day (BID) | ORAL | Status: DC
  Administered 2018-10-11 – 2018-10-12 (×2): 40 mg via ORAL
  Filled 2018-10-11 (×2): qty 2

## 2018-10-11 MED ORDER — BUSPIRONE HCL 10 MG PO TABS
20.0000 mg | ORAL_TABLET | Freq: Two times a day (BID) | ORAL | Status: DC
  Administered 2018-10-11 – 2018-10-12 (×2): 20 mg via ORAL
  Filled 2018-10-11 (×3): qty 2

## 2018-10-11 MED ORDER — BUSPIRONE HCL 10 MG PO TABS: 20.0000 mg | ORAL_TABLET | Freq: Two times a day (BID) | ORAL | Status: DC

## 2018-10-11 MED ORDER — IBUPROFEN 800 MG PO TABS
800.0000 mg | ORAL_TABLET | Freq: Three times a day (TID) | ORAL | Status: DC | PRN
  Administered 2018-10-11: 800 mg via ORAL
  Filled 2018-10-11 (×2): qty 1

## 2018-10-11 MED ORDER — CALCIUM CARBONATE ANTACID 500 MG PO CHEW
1.0000 | CHEWABLE_TABLET | Freq: Three times a day (TID) | ORAL | Status: DC
  Administered 2018-10-12 (×2): 200 mg via ORAL
  Filled 2018-10-11 (×4): qty 1

## 2018-10-11 MED ORDER — SERTRALINE HCL 100 MG PO TABS
200.0000 mg | ORAL_TABLET | Freq: Every day | ORAL | Status: DC
  Administered 2018-10-12: 10:00:00 200 mg via ORAL
  Filled 2018-10-11 (×2): qty 2

## 2018-10-11 MED ORDER — PANTOPRAZOLE SODIUM 40 MG PO TBEC
40.0000 mg | DELAYED_RELEASE_TABLET | Freq: Every day | ORAL | Status: DC
  Administered 2018-10-11 – 2018-10-12 (×2): 40 mg via ORAL
  Filled 2018-10-11 (×2): qty 1

## 2018-10-11 MED ORDER — SERTRALINE HCL 100 MG PO TABS: 200.0000 mg | ORAL_TABLET | Freq: Every day | ORAL | Status: DC

## 2018-10-11 NOTE — TOC Progression Note (Signed)
Transition of Care [facility]) - Progression Note    Patient Details  Name: Natasha [name] MRN: [id number] Date of Birth: 1985/05/21  Transition of Care [facility] CM/SW Contact  Tania Rosaelena Kemnitz, LCSW Phone Number: 10/11/2018, 5:52 PM  Clinical Narrative:     Pt shared that she would like to go to a homeless shelter.  CSW contacted [facility] [place] to see if they had a female bed available, and no one picked up. CSW informed pt that she will try to call again tomorrow morning. Pt understood, and was fine with this plan.    Expected Discharge Plan: Homeless Shelter Barriers to Discharge: No Barriers Identified  Expected Discharge Plan and Services Expected Discharge Plan: Homeless Shelter   Discharge Planning Services: CM Consult   Living arrangements for the past 2 months: Group Home                                       Social Determinants of Health (SDOH) Interventions    Readmission Risk Interventions No flowsheet data found.
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
## 2018-10-11 NOTE — ED Notes (Signed)
Hourly rounding reveals patient sleeping in room. No complaints, stable, in no acute distress. Q15 minute rounds and monitoring via Security Cameras to continue. 
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
## 2018-10-11 NOTE — ED Notes (Signed)
Pt dressed out by this tech. Pt Belongings: Pearline Cables pants Blue underwear [employer] Tan Bra Black shoes Black socks Black purse [employer]line Cables plastic bag of  Medications 1 watch Pair of hoop earrings  (Both placed in labeled specimen cup, and placed in pt's belonging bag)
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
## 2018-10-11 NOTE — ED Notes (Signed)
Hourly rounding reveals patient in room. No complaints, stable, in no acute distress. Q15 minute rounds and monitoring via Security Cameras to continue. 
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
## 2018-10-11 NOTE — ED Notes (Signed)
Patient using phone 

## 2018-10-11 NOTE — ED Notes (Signed)
VOL/Consult completed/Pending Placement 
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
## 2018-10-11 NOTE — ED Notes (Signed)
Patient assigned to appropriate care area   Introduced self to pt  Patient oriented to unit/care area: Informed that, for their safety, care areas are designed for safety and visiting and phone hours explained to patient. Patient verbalizes understanding, and verbal contract for safety obtained  Environment secured  

## 2018-10-12 DIAGNOSIS — F900 Attention-deficit hyperactivity disorder, predominantly inattentive type: Secondary | ICD-10-CM | POA: Diagnosis not present

## 2018-10-12 DIAGNOSIS — F332 Major depressive disorder, recurrent severe without psychotic features: Secondary | ICD-10-CM | POA: Diagnosis not present

## 2018-10-12 DIAGNOSIS — R45851 Suicidal ideations: Secondary | ICD-10-CM | POA: Diagnosis not present

## 2018-10-12 DIAGNOSIS — F419 Anxiety disorder, unspecified: Secondary | ICD-10-CM | POA: Diagnosis not present

## 2018-10-12 LAB — SARS CORONAVIRUS 2 BY RT PCR (HOSPITAL ORDER, PERFORMED IN ~~LOC~~ HOSPITAL LAB): SARS Coronavirus 2: NEGATIVE

## 2018-10-12 NOTE — ED Notes (Signed)
Hourly rounding reveals patient in rest room. No complaints, stable, in no acute distress. Q15 minute rounds and monitoring via Security Cameras to continue. 

## 2018-10-12 NOTE — ED Notes (Signed)
Hourly rounding reveals patient sleeping in room. No complaints, stable, in no acute distress. Q15 minute rounds and monitoring via Security Cameras to continue. 

## 2018-10-12 NOTE — ED Notes (Signed)
BEHAVIORAL HEALTH ROUNDING Patient sleeping: No. Patient alert and oriented: yes Behavior appropriate: Yes.  ; If no, describe:  Nutrition and fluids offered: yes Toileting and hygiene offered: Yes  Sitter present: q15 minute observations and security monitoring Law enforcement present: Yes    

## 2018-10-12 NOTE — TOC Transition Note (Signed)
Transition of Care [facility]) - CM/SW Discharge Note   Patient Details  Name: Natasha [name] MRN: [id number] Date of Birth: January 04, 1986  [facility] CM/SW Contact:  Fredric Mare, LCSW Phone Number: 10/12/2018, 9:12 AM   Clinical Narrative:    Per TTS, patient will be admitted to [place] signing off.    Final [facility] care: [facility] Barriers to Discharge: No Barriers Identified   Patient Goals and CMS Choice        Discharge Placement                       Discharge Plan and Services   Discharge Planning Services: CM Consult                                 Social Determinants of Health (SDOH) Interventions     Readmission Risk Interventions No flowsheet data found.

## 2018-10-12 NOTE — ED Notes (Signed)
EMTALA reviewed. 

## 2018-10-12 NOTE — BH Assessment (Signed)
Patient has been accepted to [facility].  Patient assigned to the Adult Unit Accepting physician is Dr. Jonelle Sports.  Call report to [phone].  Representative was [employer].   ER Staff is aware of it:  Anne Ng, ER Secretary  Dr. Quentin Cornwall, ER MD  Donneta Romberg, Patient's Nurse  Address: [address] [place], [place] [address]
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
## 2019-08-28 ENCOUNTER — Other Ambulatory Visit (INDEPENDENT_AMBULATORY_CARE_PROVIDER_SITE_OTHER): Payer: Self-pay | Admitting: Gastroenterology

## 2020-02-03 ENCOUNTER — Encounter: Payer: Self-pay | Admitting: Emergency Medicine

## 2020-02-03 ENCOUNTER — Emergency Department
Admission: EM | Admit: 2020-02-03 | Discharge: 2020-02-06 | Disposition: A | Discharge status: Other Acute Inpatient Hospital | Payer: 59 | Attending: Emergency Medicine | Admitting: Emergency Medicine

## 2020-02-03 ENCOUNTER — Emergency Department: Payer: 59

## 2020-02-03 ENCOUNTER — Other Ambulatory Visit: Payer: Self-pay

## 2020-02-03 DIAGNOSIS — R4589 Other symptoms and signs involving emotional state: Secondary | ICD-10-CM

## 2020-02-03 DIAGNOSIS — Z046 Encounter for general psychiatric examination, requested by authority: Secondary | ICD-10-CM | POA: Insufficient documentation

## 2020-02-03 DIAGNOSIS — S92351A Displaced fracture of fifth metatarsal bone, right foot, initial encounter for closed fracture: Secondary | ICD-10-CM | POA: Diagnosis not present

## 2020-02-03 DIAGNOSIS — F329 Major depressive disorder, single episode, unspecified: Secondary | ICD-10-CM | POA: Insufficient documentation

## 2020-02-03 DIAGNOSIS — F32A Depression, unspecified: Secondary | ICD-10-CM | POA: Diagnosis present

## 2020-02-03 DIAGNOSIS — F17210 Nicotine dependence, cigarettes, uncomplicated: Secondary | ICD-10-CM | POA: Diagnosis not present

## 2020-02-03 DIAGNOSIS — R45851 Suicidal ideations: Secondary | ICD-10-CM | POA: Insufficient documentation

## 2020-02-03 DIAGNOSIS — Z20822 Contact with and (suspected) exposure to covid-19: Secondary | ICD-10-CM | POA: Insufficient documentation

## 2020-02-03 DIAGNOSIS — W19XXXA Unspecified fall, initial encounter: Secondary | ICD-10-CM | POA: Insufficient documentation

## 2020-02-03 DIAGNOSIS — F332 Major depressive disorder, recurrent severe without psychotic features: Secondary | ICD-10-CM | POA: Diagnosis present

## 2020-02-03 DIAGNOSIS — M79671 Pain in right foot: Secondary | ICD-10-CM

## 2020-02-03 DIAGNOSIS — S99921A Unspecified injury of right foot, initial encounter: Secondary | ICD-10-CM | POA: Diagnosis present

## 2020-02-03 LAB — URINE DRUG SCREEN, QUALITATIVE (ARMC ONLY)
Amphetamines, Ur Screen: NOT DETECTED
Barbiturates, Ur Screen: NOT DETECTED
Benzodiazepine, Ur Scrn: NOT DETECTED
Cannabinoid 50 Ng, Ur ~~LOC~~: POSITIVE — AB
Cocaine Metabolite,Ur ~~LOC~~: NOT DETECTED
MDMA (Ecstasy)Ur Screen: NOT DETECTED
Methadone Scn, Ur: NOT DETECTED
Opiate, Ur Screen: NOT DETECTED
Phencyclidine (PCP) Ur S: NOT DETECTED
Tricyclic, Ur Screen: POSITIVE — AB

## 2020-02-03 LAB — RESPIRATORY PANEL BY RT PCR (FLU A&B, COVID)
Influenza A by PCR: NEGATIVE
Influenza B by PCR: NEGATIVE
SARS Coronavirus 2 by RT PCR: NEGATIVE

## 2020-02-03 LAB — CBC
HCT: 43.4 % (ref 36.0–46.0)
Hemoglobin: 15 g/dL (ref 12.0–15.0)
MCH: 32.3 pg (ref 26.0–34.0)
MCHC: 34.6 g/dL (ref 30.0–36.0)
MCV: 93.5 fL (ref 80.0–100.0)
Platelets: 276 10*3/uL (ref 150–400)
RBC: 4.64 MIL/uL (ref 3.87–5.11)
RDW: 12.3 % (ref 11.5–15.5)
WBC: 9 10*3/uL (ref 4.0–10.5)
nRBC: 0 % (ref 0.0–0.2)

## 2020-02-03 LAB — COMPREHENSIVE METABOLIC PANEL
ALT: 15 U/L (ref 0–44)
AST: 17 U/L (ref 15–41)
Albumin: 4.4 g/dL (ref 3.5–5.0)
Alkaline Phosphatase: 55 U/L (ref 38–126)
Anion gap: 7 (ref 5–15)
BUN: 10 mg/dL (ref 6–20)
CO2: 28 mmol/L (ref 22–32)
Calcium: 10.3 mg/dL (ref 8.9–10.3)
Chloride: 101 mmol/L (ref 98–111)
Creatinine, Ser: 0.8 mg/dL (ref 0.44–1.00)
GFR, Estimated: >60 mL/min (ref >60)
Glucose, Bld: 102 mg/dL — ABNORMAL HIGH (ref 70–99)
Potassium: 3.9 mmol/L (ref 3.5–5.1)
Sodium: 136 mmol/L (ref 135–145)
Total Bilirubin: 0.7 mg/dL (ref 0.3–1.2)
Total Protein: 7.6 g/dL (ref 6.5–8.1)

## 2020-02-03 LAB — SALICYLATE LEVEL: Salicylate Lvl: <7 mg/dL — ABNORMAL LOW (ref 7.0–30.0)

## 2020-02-03 LAB — POCT PREGNANCY, URINE: Preg Test, Ur: NEGATIVE

## 2020-02-03 LAB — ACETAMINOPHEN LEVEL: Acetaminophen (Tylenol), Serum: 16 ug/mL (ref 10–30)

## 2020-02-03 LAB — ETHANOL: Alcohol, Ethyl (B): <10 mg/dL (ref <10)

## 2020-02-03 MED ORDER — LORAZEPAM 2 MG PO TABS
2.0000 mg | ORAL_TABLET | Freq: Once | ORAL | Status: AC
  Administered 2020-02-03: 2 mg via ORAL
  Filled 2020-02-03: qty 1

## 2020-02-03 MED ORDER — IBUPROFEN 600 MG PO TABS
600.0000 mg | ORAL_TABLET | Freq: Once | ORAL | Status: AC
  Administered 2020-02-03: 600 mg via ORAL
  Filled 2020-02-03: qty 1

## 2020-02-03 MED ORDER — ACETAMINOPHEN 500 MG PO TABS
1000.0000 mg | ORAL_TABLET | Freq: Once | ORAL | Status: DC
  Filled 2020-02-03: qty 2

## 2020-02-03 NOTE — Consult Note (Signed)
[facility]   Reason for Consult:  Psych Evaluation  Referring Physician:  Dr. Katrinka BlazingSmith Patient Identification: Natasha [name] MRN:  [id number] Principal Diagnosis: MDD (major depressive disorder), recurrent episode, severe (HCC) Diagnosis:  Principal Problem:   MDD (major depressive disorder), recurrent episode, severe (HCC) Active Problems:   Depression   Total Time spent with patient: 45 minutes  Subjective:   Natasha [name] is Natasha 34 y.o. female patient [facility]. Per triage nurse, mother brougth patient in and says Natasha [name] gets kicked out of the homes she has [name] placed in and has no home right now.  Says she has anger issues and has [name] threatening suicide.  Says she gathered all her meds in the car and stuffed them in her pockets and that we need to check her carefully.  Also says she fell and hurt her ankle --right.   HPI: Natasha [name], 34 y.o., female patient seen face to face  by this provider; chart reviewed and consulted with Dr. Lucianne MussKumar on 02/03/20.  On evaluation Natasha [name] reports that over the past two weeks she has [name] feeling increasingly depressed. When asked what brings her to the hospital,  She says " Natasha [name] feeling like I don't want to be here". She says that the last time she has felt suicidal was approximately 2 years ago.  She denies previous hospitalizations for SI. She denies previous suicide attempts. She states that she currently takes zoloft 200mg  daily for depression and says that she takes them consistently.  She reports currentl stressor as being homeless as of today. She says she was put out of Natasha friends house and that her mother and sister came over and helped her move.  When asked if she was able to live with them, she adamantly stated no.     During evaluation Natasha [name] is laying in bed sleeping and somewhat difficult to arouse; she is drowsy/oriented x 4; calm/cooperative; and mood congruent with  affect.  Patient is speaking in Natasha clear tone at moderate volume, and normal pace; with fair eye contact.  Her thought process is coherent and relevant; There is no indication that she is currently responding to internal/external stimuli or experiencing delusional thought content.  Patient endorses suicidal/self-harm but denies homicidal ideation, psychosis, and paranoia.  Patient has remained calm and depressed throughout assessment and has answered questions appropriately.   Recommendation:  Past Psychiatric History: MDD   Risk to Self: Suicidal Ideation: Yes-Currently Present Suicidal Intent: No Is patient at risk for suicide?: No Suicidal Plan?: No Access to Means: Yes Specify Access to Suicidal Means: Overdose on medications What has [name] your use of drugs/alcohol within the last 12 months?: Reports of none How many times?: 0 Other Self Harm Risks: Reports of none Triggers for Past Attempts: Family contact Intentional Self Injurious Behavior: None Risk to Others: Homicidal Ideation: No Thoughts of Harm to Others: No Current Homicidal Intent: No Current Homicidal Plan: No Access to Homicidal Means: No Identified Victim: Reports of none History of harm to others?: No Assessment of Violence: None Noted Violent Behavior Description: Reports of none Does patient have access to weapons?: No Criminal Charges Pending?: No Does patient have Natasha court date: No Prior Inpatient Therapy: Prior Inpatient Therapy: Yes Prior Therapy Dates: 2020 Prior Therapy Facilty/Provider(s): [facility] Reason for Treatment: SI/Depression Prior Outpatient Therapy: Prior Outpatient Therapy: No Does patient have an ACCT team?: No Does patient have Intensive In-House Services?  : No Does patient  have [facility] services? : No Does patient have P4CC services?: No  Past Medical History:  Past Medical History:  Diagnosis Date  . ADD (attention deficit disorder)   . Anxiety   . Depression   . GERD (gastroesophageal  reflux disease)   . Impaired functional mobility, balance, gait, and endurance   . Mild obesity   . Mixed receptive-expressive language disorder   . Narcotic dependence (HCC)   . Polysubstance abuse (HCC)   . Restless leg syndrome unk  . RLS (restless legs syndrome)   . Salivary gland swelling   . Substance abuse (HCC)   . Tobacco abuse   . Traumatic brain injury [facility])     Past Surgical History:  Procedure Laterality Date  . CHOLECYSTECTOMY    . ESOPHAGOGASTRODUODENOSCOPY    . ESOPHAGOGASTRODUODENOSCOPY (EGD) WITH PROPOFOL N/Natasha 10/16/2017   Procedure: ESOPHAGOGASTRODUODENOSCOPY (EGD) WITH PROPOFOL;  Surgeon: Christena Deem, MD;  Location: [facility] ENDOSCOPY;  Service: Endoscopy;  Laterality: N/Natasha;  . ESOPHAGOSCOPY W/ PERCUTANEOUS GASTROSTOMY TUBE PLACEMENT    . LITHOTRIPSY    . TUBAL LIGATION    . URETERAL STENT PLACEMENT     Family History:  Family History  Problem Relation Age of Onset  . Cancer Father   . Breast cancer Other        mgreat aunt   Family Psychiatric  History: unknown Social History:  Social History   Substance and Sexual Activity  Alcohol Use No   Comment: occasional     Social History   Substance and Sexual Activity  Drug Use Not Currently  . Types: Marijuana   Comment: recreational xanax    Social History   Socioeconomic History  . Marital status: Legally Separated    Spouse name: Not on file  . Number of children: Not on file  . Years of education: Not on file  . Highest education level: Not on file  Occupational History  . Not on file  Tobacco Use  . Smoking status: Current Every Day Smoker    Packs/day: 1.00    Types: Cigarettes    Last attempt to quit: 09/27/2014    Years since quitting: 5.3  . Smokeless tobacco: Never Used  Vaping Use  . Vaping Use: Never used  Substance and Sexual Activity  . Alcohol use: No    Comment: occasional  . Drug use: Not Currently    Types: Marijuana    Comment: recreational xanax  . Sexual  activity: Not Currently  Other Topics Concern  . Not on file  Social History Narrative  . Not on file   Social Determinants of Health   Financial Resource Strain:   . Difficulty of Paying Living Expenses: Not on file  Food Insecurity:   . Worried About Programme researcher, broadcasting/film/video in the Last Year: Not on file  . Ran Out of Food in the Last Year: Not on file  Transportation Needs:   . Lack of Transportation (Medical): Not on file  . Lack of Transportation (Non-Medical): Not on file  Physical Activity:   . Days of Exercise per Week: Not on file  . Minutes of Exercise per Session: Not on file  Stress:   . Feeling of Stress : Not on file  Social Connections:   . Frequency of Communication with Friends and Family: Not on file  . Frequency of Social Gatherings with Friends and Family: Not on file  . Attends Religious Services: Not on file  . Active Member of Clubs or Organizations: Not  on file  . Attends Banker Meetings: Not on file  . Marital Status: Not on file   Additional Social History:    Allergies:   Allergies  Allergen Reactions  . Clioquinol   . Codeine Other (See Comments)    Per Qunisha, hallucinates. 05/14/2018, not an allergy per patient  . Toradol [Ketorolac Tromethamine]     Labs:  Results for orders placed or performed during the hospital encounter of 02/03/20 (from the past 48 hour(s))  Comprehensive metabolic panel     Status: Abnormal   Collection Time: 02/03/20  3:23 PM  Result Value Ref Range   Sodium 136 135 - 145 mmol/L   Potassium 3.9 3.5 - 5.1 mmol/L   Chloride 101 98 - 111 mmol/L   CO2 28 22 - 32 mmol/L   Glucose, Bld 102 (H) 70 - 99 mg/dL    Comment: Glucose reference range applies only to samples taken after fasting for at least 8 hours.   BUN 10 6 - 20 mg/dL   Creatinine, Ser 0.16 0.44 - 1.00 mg/dL   Calcium 55.3 8.9 - 74.8 mg/dL   Total Protein 7.6 6.5 - 8.1 g/dL   Albumin 4.4 3.5 - 5.0 g/dL   AST 17 15 - 41 U/L   ALT 15 0 - 44  U/L   Alkaline Phosphatase 55 38 - 126 U/L   Total Bilirubin 0.7 0.3 - 1.2 mg/dL   GFR, Estimated >27 >07 mL/min   Anion gap 7 5 - 15    Comment: Performed at [facility], [address], [place], [place] [address]  Ethanol     Status: None   Collection Time: 02/03/20  3:23 PM  Result Value Ref Range   Alcohol, Ethyl ([name]) <10 <10 mg/dL    Comment: (NOTE) Lowest detectable limit for serum alcohol is 10 mg/dL.  For medical purposes only. Performed at [facility], [address] [place], [place] [address]   Salicylate level     Status: Abnormal   Collection Time: 02/03/20  3:23 PM  Result Value Ref Range   Salicylate Lvl <7.0 (L) 7.0 - 30.0 mg/dL    Comment: Performed at [facility], [address] [place], [place] [address]  Acetaminophen level     Status: None   Collection Time: 02/03/20  3:23 PM  Result Value Ref Range   Acetaminophen (Tylenol), Serum 16 10 - 30 ug/mL    Comment: (NOTE) Therapeutic concentrations vary significantly. Natasha range of 10-30 ug/mL  may be an effective concentration for many patients. However, some  are best treated at concentrations outside of this range. Acetaminophen concentrations >150 ug/mL at 4 hours after ingestion  and >50 ug/mL at 12 hours after ingestion are often associated with  toxic reactions.  Performed at [facility], [address] [place], [place] [address]   cbc     Status: None   Collection Time: 02/03/20  3:23 PM  Result Value Ref Range   WBC 9.0 4.0 - 10.5 K/uL   RBC 4.64 3.87 - 5.11 MIL/uL   Hemoglobin 15.0 12.0 - 15.0 g/dL   HCT 83.2 36 - 46 %   MCV 93.5 80.0 - 100.0 fL   MCH 32.3 26.0 - 34.0 pg   MCHC 34.6 30.0 - 36.0 g/dL   RDW 54.9 82.6 - 41.5 %   Platelets 276 150 - 400 K/uL   nRBC 0.0 0.0 - 0.2 %    Comment: Performed at [facility], [address], [place], [place] [address]  Urine Drug Screen, Qualitative     Status: Abnormal   Collection Time: 02/03/20   3:23 PM  Result Value Ref Range   Tricyclic, Ur Screen POSITIVE (Natasha) NONE DETECTED   Amphetamines, Ur Screen NONE DETECTED NONE DETECTED   MDMA (Ecstasy)Ur Screen NONE DETECTED NONE DETECTED   Cocaine Metabolite,Ur [place] NONE DETECTED NONE DETECTED   Opiate, Ur Screen NONE DETECTED NONE DETECTED   Phencyclidine (PCP) Ur S NONE DETECTED NONE DETECTED   Cannabinoid 50 Ng, Ur [place] POSITIVE (Natasha) NONE DETECTED   Barbiturates, Ur Screen NONE DETECTED NONE DETECTED   Benzodiazepine, Ur Scrn NONE DETECTED NONE DETECTED   Methadone Scn, Ur NONE DETECTED NONE DETECTED    Comment: (NOTE) Tricyclics + metabolites, urine    Cutoff 1000 ng/mL Amphetamines + metabolites, urine  Cutoff 1000 ng/mL MDMA (Ecstasy), urine              Cutoff 500 ng/mL Cocaine Metabolite, urine          Cutoff 300 ng/mL Opiate + metabolites, urine        Cutoff 300 ng/mL Phencyclidine (PCP), urine         Cutoff 25 ng/mL Cannabinoid, urine                 Cutoff 50 ng/mL Barbiturates + metabolites, urine  Cutoff 200 ng/mL Benzodiazepine, urine              Cutoff 200 ng/mL Methadone, urine                   Cutoff 300 ng/mL  The urine drug screen provides only Natasha preliminary, unconfirmed analytical test result and should not be used for non-medical purposes. Clinical consideration and professional judgment should be applied to any positive drug screen result due to possible interfering substances. Natasha more specific alternate chemical method must be used in order to obtain Natasha confirmed analytical result. Gas chromatography / mass spectrometry (GC/MS) is the preferred confirm atory method. Performed at [facility], [address] [place], [place] [address]   Pregnancy, urine POC     Status: None   Collection Time: 02/03/20  3:40 PM  Result Value Ref Range   Preg Test, Ur NEGATIVE NEGATIVE    Comment:        THE SENSITIVITY OF THIS METHODOLOGY IS >24 mIU/mL     Current Facility-Administered Medications   Medication Dose Route Frequency Provider Last Rate Last Admin  . acetaminophen (TYLENOL) tablet 1,000 mg  1,000 mg Oral Once Delton Pra[place], MD       Current Outpatient Medications  Medication Sig Dispense Refill  . amitriptyline (ELAVIL) 25 MG tablet Take 75 mg by mouth at bedtime.    . busPIRone (BUSPAR) 15 MG tablet Take 15 mg by mouth 3 (three) times daily.    Marland Kitchen gabapentin (NEURONTIN) 300 MG capsule Take 600 mg by mouth 3 (three) times daily.    Marland Kitchen levothyroxine (SYNTHROID) 150 MCG tablet Take 150 mcg by mouth daily.    . meloxicam (MOBIC) 15 MG tablet Take 15 mg by mouth daily.    . montelukast (SINGULAIR) 10 MG tablet Take 10 mg by mouth at bedtime.    . pantoprazole (PROTONIX) 40 MG tablet Take 40 mg by mouth daily.    . rosuvastatin (CRESTOR) 10 MG tablet Take 10 mg by mouth at bedtime.    . sertraline (ZOLOFT) 100 MG tablet Take 150 mg by mouth  daily.     . tiZANidine (ZANAFLEX) 2 MG tablet Take 2 mg by mouth 3 (three) times daily.    . tamsulosin (FLOMAX) 0.4 MG CAPS capsule Take 0.4 mg by mouth daily.      Musculoskeletal: Strength & Muscle Tone: within normal limits Gait & Station: unsteady Patient leans: N/Natasha  Psychiatric Specialty Exam: Physical Exam Vitals and nursing note reviewed.  Constitutional:      Appearance: She is obese.  HENT:     Head: Normocephalic and atraumatic.     Nose: Nose normal.  Eyes:     Pupils: Pupils are equal, round, and reactive to light.  Pulmonary:     Effort: Pulmonary effort is normal.  Musculoskeletal:        General: Normal range of motion.     Cervical back: Normal range of motion.  Skin:    General: Skin is dry.  Neurological:     Mental Status: She is oriented to person, place, and time.  Psychiatric:        Attention and Perception: Attention normal.        Mood and Affect: Mood is depressed.        Speech: Speech normal.        Behavior: Behavior normal.        Thought Content: Thought content includes suicidal ideation.         Cognition and Memory: Cognition normal.        Judgment: Judgment is inappropriate.     Review of Systems  Psychiatric/Behavioral: Positive for decreased concentration and suicidal ideas.  All other systems reviewed and are negative.   Blood pressure 124/80, pulse 78, temperature 97.8 F (36.6 C), temperature source Oral, resp. rate 18, height  (1.651 m), weight 83.5 kg, SpO2 99 %.Body mass index is 30.62 kg/m.  General Appearance: Bizarre  Eye Contact:  Fair  Speech:  Clear and Coherent  Volume:  Decreased  Mood:  Anxious and Depressed  Affect:  Congruent and Depressed  Thought Process:  Coherent  Orientation:  Full (Time, Place, and Person)  Thought Content:  WDL  Suicidal Thoughts:  Yes.  with intent/plan  Homicidal Thoughts:  No  Memory:  Recent;   Fair  Judgement:  Fair  Insight:  Fair  Psychomotor Activity:  Normal  Concentration:  Attention Span: Fair  Recall:  [employer] of Knowledge:  Fair  Language:  Fair  Akathisia:  NA  Handed:  Right  AIMS (if indicated):     Assets:  Desire for Improvement  ADL's:  Intact  Cognition:  WNL  Sleep:        Treatment Plan Summary: Daily contact with patient to assess and evaluate symptoms and progress in treatment and Medication management  Disposition: Recommend psychiatric Inpatient admission when medically cleared. Supportive therapy provided about ongoing stressors. Discussed crisis plan, support from social network, calling 911, coming to the Emergency Department, and calling Suicide Hotline.  Jearld Lesch, NP 02/03/2020 10:14 PM

## 2020-02-03 NOTE — ED Notes (Signed)
[facility], pend psych consult 

## 2020-02-03 NOTE — ED Triage Notes (Signed)
Mother brougth patient in and says Natasha [name] gets kicked out of the homes she has been placed in and has no home right now.  Says she has anger issues and has been threatening suicide.  Says she gathered all her meds int he car and stuffed them in her pockets and that we need to check her carefully.  Also says she fell and hurt her ankle --right.

## 2020-02-03 NOTE — ED Notes (Signed)
Rashaun, NP in with patient.

## 2020-02-03 NOTE — BH Assessment (Addendum)
Referral information for Psychiatric Hospitalization faxed to;   [facility] [phone]), Denied due to behaviors that lead to homelessness and Amphetamines  . [facility] ([phone]), Denied due to exclusionary  . [facility] [phone]), Intake staff reports referral has not been reviewed yet as of 1:00am, contacted back at 4:00am No answer  . [facility] [phone] -[phone]), Denied due to TBI
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
## 2020-02-03 NOTE — BH Assessment (Addendum)
Assessment Note  Natasha [name] is an 34 y.o. female who presents to the ER due to having thoughts of ending her life. Per the ER notes, she told her mother she was going to overdose on her medications. Per the patient, she has passive thoughts of ending her life and didn't have a plan. She further reports, she was upset because no one loves her and do not want to be around her. Today (02/03/2020), she was asked to leave where she was living. She says, "this is the third time this year I been kicked out." Per patient was inpatient approximately a year ago, due to having SI and depression.   During the interview, the patient was calm, cooperative and pleasant. She was able to provide appropriate answers to the questions. She denies HI and AV/H. She also denies history of violence and aggression. She was able to provide appropriate answers to the questions.  Diagnosis: Depression  Past Medical History:  Past Medical History:  Diagnosis Date  . ADD (attention deficit disorder)   . Anxiety   . Depression   . GERD (gastroesophageal reflux disease)   . Impaired functional mobility, balance, gait, and endurance   . Mild obesity   . Mixed receptive-expressive language disorder   . Narcotic dependence (HCC)   . Polysubstance abuse (HCC)   . Restless leg syndrome unk  . RLS (restless legs syndrome)   . Salivary gland swelling   . Substance abuse (HCC)   . Tobacco abuse   . Traumatic brain injury [facility])     Past Surgical History:  Procedure Laterality Date  . CHOLECYSTECTOMY    . ESOPHAGOGASTRODUODENOSCOPY    . ESOPHAGOGASTRODUODENOSCOPY (EGD) WITH PROPOFOL N/A 10/16/2017   Procedure: ESOPHAGOGASTRODUODENOSCOPY (EGD) WITH PROPOFOL;  Surgeon: Christena Deem, MD;  Location: [facility] ENDOSCOPY;  Service: Endoscopy;  Laterality: N/A;  . ESOPHAGOSCOPY W/ PERCUTANEOUS GASTROSTOMY TUBE PLACEMENT    . LITHOTRIPSY    . TUBAL LIGATION    . URETERAL STENT PLACEMENT      Family History:  Family  History  Problem Relation Age of Onset  . Cancer Father   . Breast cancer Other        mgreat aunt    Social History:  reports that she has been smoking cigarettes. She has been smoking about 1.00 pack per day. She has never used smokeless tobacco. She reports previous drug use. Drug: Marijuana. She reports that she does not drink alcohol.  Additional Social History:  Alcohol / Drug Use Pain Medications: See PTA Prescriptions: See PTA Over the Counter: See PTA History of alcohol / drug use?: No history of alcohol / drug abuse Longest period of sobriety (when/how long): Reports of no past or current use  CIWA: CIWA-Ar BP: 132/88 Pulse Rate: 90 COWS:    Allergies:  Allergies  Allergen Reactions  . Clioquinol   . Codeine Other (See Comments)    Per [place], hallucinates. 05/14/2018, not an allergy per patient  . Toradol [Ketorolac Tromethamine]     Home Medications: (Not in a hospital admission)   OB/GYN Status:  No LMP recorded.  General Assessment Data Location of Assessment: [facility] ED TTS Assessment: In system Is this a Tele or Face-to-Face Assessment?: Face-to-Face Is this an Initial Assessment or a Re-assessment for this encounter?: Initial Assessment Patient Accompanied by:: N/A Language Other than English: No Living Arrangements: Homeless/Shelter What gender do you identify as?: Female Date Telepsych consult ordered in [facility]: 02/03/20 Time Telepsych consult ordered in [facility]: 1458 Marital  status: Single Pregnancy Status: No Living Arrangements: Other (Comment) (Homeless) Can pt return to current living arrangement?: Yes Admission Status: Involuntary Petitioner: Family member Is patient capable of signing voluntary admission?: No (Under IVC) Referral Source: Self/Family/Friend Insurance type: UHC  Medical Screening Exam [facility] Walk-in ONLY) Medical Exam completed: Yes  Crisis Care Plan Living Arrangements: Other (Comment) (Homeless) Legal Guardian: Other:  (Self) Name of Psychiatrist: Reports of none Name of Therapist: Reports of none  Education Status Is patient currently in school?: No Is the patient employed, unemployed or receiving disability?: Unemployed, Receiving disability income  Risk to self with the past 6 months Suicidal Ideation: Yes-Currently Present Has patient been a risk to self within the past 6 months prior to admission? : No Suicidal Intent: No Has patient had any suicidal intent within the past 6 months prior to admission? : No Is patient at risk for suicide?: No Suicidal Plan?: No Has patient had any suicidal plan within the past 6 months prior to admission? : Yes Access to Means: Yes Specify Access to Suicidal Means: Overdose on medications What has been your use of drugs/alcohol within the last 12 months?: Reports of none Previous Attempts/Gestures: No How many times?: 0 Other Self Harm Risks: Reports of none Triggers for Past Attempts: Family contact Intentional Self Injurious Behavior: None Family Suicide History: Unknown Recent stressful life event(s): Other (Comment) Persecutory voices/beliefs?: No Depression: Yes Depression Symptoms: Insomnia, Isolating, Feeling angry/irritable, Feeling worthless/self pity Substance abuse history and/or treatment for substance abuse?: No Suicide prevention information given to non-admitted patients: Not applicable  Risk to Others within the past 6 months Homicidal Ideation: No Does patient have any lifetime risk of violence toward others beyond the six months prior to admission? : No Thoughts of Harm to Others: No Current Homicidal Intent: No Current Homicidal Plan: No Access to Homicidal Means: No Identified Victim: Reports of none History of harm to others?: No Assessment of Violence: None Noted Violent Behavior Description: Reports of none Does patient have access to weapons?: No Criminal Charges Pending?: No Does patient have a court date: No Is patient on  probation?: No  Psychosis Hallucinations: None noted Delusions: None noted  Mental Status Report Appearance/Hygiene: Unremarkable, In scrubs Eye Contact: Fair Motor Activity: Freedom of movement, Unremarkable Speech: Logical/coherent, Unremarkable Level of Consciousness: Alert Mood: Sad, Pleasant Affect: Appropriate to circumstance, Sad Anxiety Level: Minimal Thought Processes: Coherent, Relevant Judgement: Unimpaired Orientation: Person, Place, Time, Situation, Appropriate for developmental age Obsessive Compulsive Thoughts/Behaviors: Minimal  Cognitive Functioning Concentration: Normal Memory: Recent Intact, Remote Intact Is patient IDD: No Insight: Fair Impulse Control: Fair Appetite: Good Have you had any weight changes? : No Change Sleep: Decreased Total Hours of Sleep: 6 Vegetative Symptoms: None  ADLScreening [facility] Assessment Services) Patient's cognitive ability adequate to safely complete daily activities?: Yes Patient able to express need for assistance with ADLs?: Yes Independently performs ADLs?: Yes (appropriate for developmental age)  Prior Inpatient Therapy Prior Inpatient Therapy: Yes Prior Therapy Dates: 2020 Prior Therapy Facilty/Provider(s): [facility] Reason for Treatment: SI/Depression  Prior Outpatient Therapy Prior Outpatient Therapy: No Does patient have an ACCT team?: No Does patient have Intensive In-House Services?  : No Does patient have [facility] services? : No Does patient have P4CC services?: No  ADL Screening (condition at time of admission) Patient's cognitive ability adequate to safely complete daily activities?: Yes Is the patient deaf or have difficulty hearing?: No Does the patient have difficulty seeing, even when wearing glasses/contacts?: No Does the patient have difficulty concentrating, remembering,  or making decisions?: No Patient able to express need for assistance with ADLs?: Yes Does the patient have difficulty dressing or  bathing?: No Independently performs ADLs?: Yes (appropriate for developmental age) Does the patient have difficulty walking or climbing stairs?: No Weakness of Legs: None Weakness of Arms/Hands: None  Home Assistive Devices/Equipment Home Assistive Devices/Equipment: None  Therapy Consults (therapy consults require a physician order) PT Evaluation Needed: No OT Evalulation Needed: No SLP Evaluation Needed: No Abuse/Neglect Assessment (Assessment to be complete while patient is alone) Abuse/Neglect Assessment Can Be Completed: Yes Physical Abuse: Denies Verbal Abuse: Denies Sexual Abuse: Denies Exploitation of patient/patient's resources: Denies Self-Neglect: Denies Values / Beliefs Cultural Requests During Hospitalization: None Spiritual Requests During Hospitalization: None Consults Spiritual Care Consult Needed: No Transition of Care Team Consult Needed: No Advance Directives (For Healthcare) Does Patient Have a Medical Advance Directive?: No Would patient like information on creating a medical advance directive?: No - Patient declined   Disposition: Pending Psych Consult Disposition Initial Assessment Completed for this Encounter: Yes  On Site Evaluation by:   Reviewed with Physician:    Lilyan Gilford MS, LCAS, [facility], NCC Therapeutic Triage Specialist 02/03/2020 7:02 PM

## 2020-02-03 NOTE — ED Notes (Signed)
Pt requested and was given medication for "nerves" and pain.

## 2020-02-04 DIAGNOSIS — S92351A Displaced fracture of fifth metatarsal bone, right foot, initial encounter for closed fracture: Secondary | ICD-10-CM | POA: Diagnosis not present

## 2020-02-04 MED ORDER — GABAPENTIN 300 MG PO CAPS
600.0000 mg | ORAL_CAPSULE | Freq: Three times a day (TID) | ORAL | Status: DC
  Administered 2020-02-04: 600 mg via ORAL
  Filled 2020-02-04: qty 2

## 2020-02-04 MED ORDER — AMITRIPTYLINE HCL 50 MG PO TABS
75.0000 mg | ORAL_TABLET | Freq: Every day | ORAL | Status: DC
  Administered 2020-02-04 – 2020-02-05 (×2): 75 mg via ORAL
  Filled 2020-02-04: qty 2
  Filled 2020-02-04 (×2): qty 1

## 2020-02-04 MED ORDER — TIZANIDINE HCL 2 MG PO TABS
2.0000 mg | ORAL_TABLET | Freq: Three times a day (TID) | ORAL | Status: DC
  Administered 2020-02-04 – 2020-02-05 (×6): 2 mg via ORAL
  Filled 2020-02-04 (×8): qty 1

## 2020-02-04 MED ORDER — MELOXICAM 7.5 MG PO TABS
15.0000 mg | ORAL_TABLET | Freq: Every day | ORAL | Status: DC
  Administered 2020-02-04 – 2020-02-05 (×2): 15 mg via ORAL
  Filled 2020-02-04 (×3): qty 2

## 2020-02-04 MED ORDER — IBUPROFEN 600 MG PO TABS
600.0000 mg | ORAL_TABLET | Freq: Once | ORAL | Status: AC
  Administered 2020-02-04: 600 mg via ORAL
  Filled 2020-02-04: qty 1

## 2020-02-04 MED ORDER — LEVOTHYROXINE SODIUM 50 MCG PO TABS: 150.0000 ug | ORAL_TABLET | Freq: Every day | ORAL | Status: DC

## 2020-02-04 MED ORDER — LEVOTHYROXINE SODIUM 50 MCG PO TABS
150.0000 ug | ORAL_TABLET | Freq: Every day | ORAL | Status: DC
  Administered 2020-02-04: 150 ug via ORAL
  Filled 2020-02-04: qty 2

## 2020-02-04 MED ORDER — MONTELUKAST SODIUM 10 MG PO TABS
10.0000 mg | ORAL_TABLET | Freq: Every day | ORAL | Status: DC
  Administered 2020-02-04 – 2020-02-05 (×2): 10 mg via ORAL
  Filled 2020-02-04 (×4): qty 1

## 2020-02-04 MED ORDER — SERTRALINE HCL 50 MG PO TABS
200.0000 mg | ORAL_TABLET | Freq: Every day | ORAL | Status: DC
  Administered 2020-02-05: 200 mg via ORAL
  Filled 2020-02-04: qty 2

## 2020-02-04 MED ORDER — ROSUVASTATIN CALCIUM 10 MG PO TABS
10.0000 mg | ORAL_TABLET | Freq: Every day | ORAL | Status: DC
  Filled 2020-02-04: qty 1

## 2020-02-04 MED ORDER — ROSUVASTATIN CALCIUM 10 MG PO TABS
10.0000 mg | ORAL_TABLET | Freq: Every day | ORAL | Status: DC
  Administered 2020-02-05: 10 mg via ORAL
  Filled 2020-02-04: qty 1

## 2020-02-04 MED ORDER — GABAPENTIN 300 MG PO CAPS
300.0000 mg | ORAL_CAPSULE | Freq: Three times a day (TID) | ORAL | Status: DC
  Administered 2020-02-04 – 2020-02-05 (×5): 300 mg via ORAL
  Filled 2020-02-04 (×5): qty 1

## 2020-02-04 MED ORDER — ACETAMINOPHEN 500 MG PO TABS
1000.0000 mg | ORAL_TABLET | Freq: Once | ORAL | Status: AC
  Administered 2020-02-04: 1000 mg via ORAL

## 2020-02-04 MED ORDER — ACETAMINOPHEN 500 MG PO TABS
ORAL_TABLET | ORAL | Status: AC
  Filled 2020-02-04: qty 2

## 2020-02-04 MED ORDER — IBUPROFEN 600 MG PO TABS
600.0000 mg | ORAL_TABLET | ORAL | Status: AC
  Filled 2020-02-04: qty 1

## 2020-02-04 MED ORDER — SERTRALINE HCL 50 MG PO TABS
150.0000 mg | ORAL_TABLET | Freq: Every day | ORAL | Status: DC
  Administered 2020-02-04: 150 mg via ORAL
  Filled 2020-02-04: qty 3

## 2020-02-04 MED ORDER — PANTOPRAZOLE SODIUM 40 MG PO TBEC
40.0000 mg | DELAYED_RELEASE_TABLET | Freq: Every day | ORAL | Status: DC
  Administered 2020-02-04 – 2020-02-05 (×2): 40 mg via ORAL
  Filled 2020-02-04 (×2): qty 1

## 2020-02-04 MED ORDER — BUSPIRONE HCL 5 MG PO TABS
15.0000 mg | ORAL_TABLET | Freq: Three times a day (TID) | ORAL | Status: DC
  Administered 2020-02-04 – 2020-02-05 (×6): 15 mg via ORAL
  Filled 2020-02-04: qty 3
  Filled 2020-02-04: qty 2
  Filled 2020-02-04 (×2): qty 3
  Filled 2020-02-04 (×2): qty 2

## 2020-02-04 NOTE — ED Provider Notes (Signed)
Emergency Medicine Observation Re-evaluation Note  Natasha [name] is a 34 y.o. female, seen on rounds today.  Pt initially presented to the ED for complaints of Psychiatric Evaluation Currently, the patient is resting, voices no medical complaints.  Physical Exam  BP 124/80 (BP Location: Right Arm)   Pulse 78   Temp 97.8 F (36.6 C) (Oral)   Resp 18   Ht 5\' 5"  (1.651 m)   Wt 83.5 kg   SpO2 99%   BMI 30.62 kg/m  Physical Exam General: Resting in no acute distress Cardiac: No cyanosis Lungs: Equal rise and fall Psych: Not agitated  ED Course / MDM  EKG:  Clinical Course as of Feb 04 520  Fri Feb 03, 2020  2251 The patient has been placed in psychiatric observation due to the need to provide a safe environment for the patient while obtaining psychiatric consultation and evaluation, as well as ongoing medical and medication management to treat the patient's condition. The patient has been placed under full IVC at this time.     [DS]    Clinical Course User Index [DS] 2252, MD   I have reviewed the labs performed to date as well as medications administered while in observation.  Recent changes in the last 24 hours include ibuprofen administered for foot pain.  Plan  Current plan is for psychiatric hospitalization; patient has been referred to several facilities, awaiting acceptance. Patient is under full IVC at this time.   Natasha Prairie, MD 02/04/20 [phone]

## 2020-02-04 NOTE — ED Notes (Signed)
Pt given breakfast tray

## 2020-02-04 NOTE — ED Notes (Signed)
Pt given things to shower and chair due to broken toe.

## 2020-02-04 NOTE — ED Notes (Signed)
Pt up to the restroom. No distress noted, no assistance needed.

## 2020-02-04 NOTE — BH Assessment (Addendum)
Patient has been accepted to [facility].  Patient assigned to [facility] Accepting physician is Dr. Estill Cotta.  Call report to [phone].  Representative was [employer].   ER Staff is aware of it:  Stamps, ER Licensed conveyancer, Patient's Nurse      Address: [address],  [place], [place] [address]

## 2020-02-04 NOTE — ED Notes (Signed)
Pysch Provider at bedside.

## 2020-02-04 NOTE — BH Assessment (Signed)
Writer called and spoke with [facility] ([phone]) and updated her that the patient will not transport today due to lack of transportation but can come tomorrow (02/05/2020). They will hold the bed for her.

## 2020-02-04 NOTE — ED Notes (Signed)
Lunch tray given to pt at this time; no needs voiced. Will continue to monitor Q15 minute rounds.

## 2020-02-05 DIAGNOSIS — S92351A Displaced fracture of fifth metatarsal bone, right foot, initial encounter for closed fracture: Secondary | ICD-10-CM | POA: Diagnosis not present

## 2020-02-05 MED ORDER — IBUPROFEN 600 MG PO TABS
600.0000 mg | ORAL_TABLET | Freq: Four times a day (QID) | ORAL | Status: DC | PRN
  Administered 2020-02-05 – 2020-02-06 (×3): 600 mg via ORAL
  Filled 2020-02-05 (×3): qty 1

## 2020-02-05 MED ORDER — MELATONIN 5 MG PO TABS
2.5000 mg | ORAL_TABLET | Freq: Every day | ORAL | Status: DC
  Administered 2020-02-05: 2.5 mg via ORAL
  Filled 2020-02-05: qty 1
  Filled 2020-02-05: qty 0.5

## 2020-02-05 NOTE — ED Notes (Signed)
Snack and beverage given. 

## 2020-02-05 NOTE — ED Notes (Addendum)
Patient given sanitary pad

## 2020-02-05 NOTE — ED Notes (Signed)
Hourly rounding reveals patient in room. No complaints, stable, in no acute distress. Q15 minute rounds and monitoring via Rover and Officer to continue.   

## 2020-02-05 NOTE — ED Notes (Signed)
Hourly rounding reveals patient in room. No complaints, stable, in no acute distress. Q15 minute rounds and monitoring via Security Cameras to continue. 

## 2020-02-05 NOTE — ED Provider Notes (Signed)
Emergency Medicine Observation Re-evaluation Note  Natasha [name] is a 34 y.o. female, seen on rounds today.  Pt initially presented to the ED for complaints of Psychiatric Evaluation Currently, the patient is resting, easily alerts conversant ports still having tenderness over the right foot where she does have some bruising over the toes but no swelling and normal capillary perfusion.  Physical Exam  BP 128/68 (BP Location: Left Arm)   Pulse 66   Temp 97.8 F (36.6 C) (Oral)   Resp 16   Ht 5\' 5"  (1.651 m)   Wt 83.5 kg   SpO2 99%   BMI 30.62 kg/m  Physical Exam General: Alert, oriented no distress Cardiac: Normal perfusion, normal capillary refill digits of the right foot Lungs: Clear speech, no distress, normal respirations Psych: Calm and pleasant  ED Course / MDM  EKG:  Clinical Course as of Feb 05 804  Fri Feb 03, 2020  2251 The patient has been placed in psychiatric observation due to the need to provide a safe environment for the patient while obtaining psychiatric consultation and evaluation, as well as ongoing medical and medication management to treat the patient's condition. The patient has been placed under full IVC at this time.     [DS]    Clinical Course User Index [DS] 2252, MD   I have reviewed the labs performed to date as well as medications administered while in observation.  Recent changes in the last 24 hours include addition of as needed Motrin.  Plan  Current plan is for psychiatric placement and disposition per psych team. Patient is under full IVC at this time.   Delton Pra[place], MD 02/05/20 [phone]

## 2020-02-05 NOTE — ED Notes (Signed)
Report to include Situation, Background, Assessment, and Recommendations received from Amber RN. Patient alert and oriented, warm and dry, in no acute distress. Patient denies SI, HI, AVH and pain. Patient made aware of Q15 minute rounds and security cameras for their safety. Patient instructed to come to me with needs or concerns.  

## 2020-02-05 NOTE — BH Assessment (Signed)
Writer spoke with the patient to complete an updated/reassessment. Patient continues to voice SI and share she hasn't had any changes or improvements while waiting in the ER. She denies HI and AV/H.

## 2020-02-06 DIAGNOSIS — S92351A Displaced fracture of fifth metatarsal bone, right foot, initial encounter for closed fracture: Secondary | ICD-10-CM | POA: Diagnosis not present

## 2020-02-06 MED ORDER — ACETAMINOPHEN 500 MG PO TABS
1000.0000 mg | ORAL_TABLET | Freq: Once | ORAL | Status: AC
  Administered 2020-02-06: 1000 mg via ORAL
  Filled 2020-02-06: qty 2

## 2020-02-06 NOTE — BH Assessment (Signed)
TTS confirmed with Kathlene November [facility]) pt's bed to be available today due to lack of transportation over the weekend.

## 2020-02-06 NOTE — ED Notes (Signed)
Hourly rounding reveals patient in room. No complaints, stable, in no acute distress. Q15 minute rounds and monitoring via Rover and Officer to continue.   

## 2020-02-06 NOTE — ED Notes (Signed)
IVC/pending transport to [facility]

## 2020-02-06 NOTE — ED Provider Notes (Signed)
Emergency Medicine Observation Re-evaluation Note  Natasha [name] is a 34 y.o. female, seen on rounds today.  Pt initially presented to the ED for complaints of Psychiatric Evaluation Currently, the patient is resting.  Physical Exam  BP 129/78 (BP Location: Right Arm)   Pulse 65   Temp 97.9 F (36.6 C) (Oral)   Resp 18   Ht 1.651 m (5\' 5" )   Wt 83.5 kg   SpO2 98%   BMI 30.62 kg/m  Physical Exam  Gen:  No acute distress Resp:  Breathing easily and comfortably, no accessory muscle usage Neuro:  Moving all four extremities, no gross focal neuro deficits Psych:  Resting currently, calm and cooperative when awake  ED Course / MDM  EKG:   I have reviewed the labs performed to date as well as medications administered while in observation.  Recent changes in the last 24 hours include inability to transport to [facility].  Plan  Current plan is for transfer to [facility] for additional psychiatric treatment. Patient is under full IVC at this time.   [facility], MD 02/06/20 [phone]

## 2020-02-06 NOTE — ED Notes (Signed)
Pt discharged under IVC to [facility]. VS stable. All belongings (3 bags) sent with officer. Pt calm and cooperative.

## 2020-02-06 NOTE — ED Notes (Signed)
RN gave report to RN on main campus. [place] [facility] RN not available for report at this time.

## 2020-02-06 NOTE — ED Notes (Signed)
RN called [facility] and confirmed patient's bed was still available.  Accepting nurse will be in at 8 am to receive report.

## 2020-12-12 ENCOUNTER — Other Ambulatory Visit: Payer: Self-pay

## 2020-12-12 ENCOUNTER — Other Ambulatory Visit (HOSPITAL_COMMUNITY): Payer: Self-pay

## 2020-12-12 DIAGNOSIS — R1084 Generalized abdominal pain: Secondary | ICD-10-CM

## 2020-12-12 DIAGNOSIS — R140 Abdominal distension (gaseous): Secondary | ICD-10-CM

## 2020-12-12 DIAGNOSIS — Z87442 Personal history of urinary calculi: Secondary | ICD-10-CM

## 2020-12-27 ENCOUNTER — Ambulatory Visit: Payer: Medicaid Other

## 2021-01-31 ENCOUNTER — Ambulatory Visit: Payer: Medicaid Other

## 2021-02-19 ENCOUNTER — Ambulatory Visit: Admission: RE | Admit: 2021-02-19 | Payer: Medicaid Other | Source: Ambulatory Visit

## 2024-02-19 ENCOUNTER — Encounter: Payer: Self-pay | Admitting: Emergency Medicine

## 2024-02-19 ENCOUNTER — Ambulatory Visit
Admission: EM | Admit: 2024-02-19 | Discharge: 2024-02-19 | Disposition: A | Discharge status: Home / Self Care | Attending: Physician Assistant | Admitting: Physician Assistant

## 2024-02-19 ENCOUNTER — Ambulatory Visit (INDEPENDENT_AMBULATORY_CARE_PROVIDER_SITE_OTHER)

## 2024-02-19 DIAGNOSIS — S8011XA Contusion of right lower leg, initial encounter: Secondary | ICD-10-CM

## 2024-02-19 DIAGNOSIS — R0789 Other chest pain: Secondary | ICD-10-CM

## 2024-02-19 DIAGNOSIS — S81001A Unspecified open wound, right knee, initial encounter: Secondary | ICD-10-CM

## 2024-02-19 MED ORDER — NAPROXEN 500 MG PO TABS: 500.0000 mg | ORAL_TABLET | Freq: Two times a day (BID) | ORAL | 0 refills | Status: AC | PRN

## 2024-02-19 NOTE — Discharge Instructions (Addendum)
-  No fractures -Clean wounds with soap and water daily. Don't pick at scabs.  If you notice surrounding redness, increased swelling, pustular drainage please be seen again right away. - At this point may apply heat to contused areas to help the blood reabsorb faster.  Elevate extremity as well.  May continue Tylenol .  I sent naproxen anti-inflammatory medication to the pharmacy.  You stated that you could take that. - If any acute worsening of pain please go to the ER.
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
